# Patient Record
Sex: Male | Born: 1975 | Race: White | Hispanic: No | Marital: Single | Smoking: Current every day smoker
Health system: Southern US, Community
[De-identification: ages and names within clinical notes are randomized; demographics above are authoritative.]

## PROBLEM LIST (undated history)

## (undated) DIAGNOSIS — J3089 Other allergic rhinitis: Secondary | ICD-10-CM

## (undated) DIAGNOSIS — R51 Headache: Secondary | ICD-10-CM

## (undated) DIAGNOSIS — M009 Pyogenic arthritis, unspecified: Secondary | ICD-10-CM

## (undated) DIAGNOSIS — F602 Antisocial personality disorder: Secondary | ICD-10-CM

## (undated) DIAGNOSIS — R251 Tremor, unspecified: Secondary | ICD-10-CM

## (undated) DIAGNOSIS — E785 Hyperlipidemia, unspecified: Secondary | ICD-10-CM

## (undated) DIAGNOSIS — F319 Bipolar disorder, unspecified: Secondary | ICD-10-CM

## (undated) DIAGNOSIS — R519 Headache, unspecified: Secondary | ICD-10-CM

## (undated) DIAGNOSIS — K219 Gastro-esophageal reflux disease without esophagitis: Secondary | ICD-10-CM

## (undated) DIAGNOSIS — F209 Schizophrenia, unspecified: Secondary | ICD-10-CM

## (undated) DIAGNOSIS — R569 Unspecified convulsions: Secondary | ICD-10-CM

## (undated) HISTORY — PX: WRIST RECONSTRUCTION: SHX2675

## (undated) HISTORY — PX: CLOSED REDUCTION WRIST FRACTURE: SHX1091

## (undated) HISTORY — PX: ORIF ELBOW FRACTURE: SUR928

## (undated) HISTORY — PX: SCALP LACERATION REPAIR: SHX6089

---

## 2012-03-21 LAB — URINALYSIS, COMPLETE
Bacteria: NONE SEEN
Bilirubin,UR: NEGATIVE
Blood: NEGATIVE
Glucose,UR: NEGATIVE mg/dL
Ketone: NEGATIVE
Leukocyte Esterase: NEGATIVE
Nitrite: NEGATIVE
Ph: 5
Protein: NEGATIVE
RBC,UR: 1 /HPF
Specific Gravity: 1.019
Squamous Epithelial: NONE SEEN
WBC UR: <1 /HPF

## 2012-03-21 LAB — ETHANOL
Ethanol %: <0.003 %
Ethanol: <3 mg/dL

## 2012-03-21 LAB — DRUG SCREEN, URINE
Barbiturates, Ur Screen: POSITIVE (ref ?–200)
Benzodiazepine, Ur Scrn: NEGATIVE (ref ?–200)
Cannabinoid 50 Ng, Ur ~~LOC~~: NEGATIVE (ref ?–50)
Cocaine Metabolite,Ur ~~LOC~~: NEGATIVE (ref ?–300)
MDMA (Ecstasy)Ur Screen: NEGATIVE (ref ?–500)
Opiate, Ur Screen: NEGATIVE (ref ?–300)
Phencyclidine (PCP) Ur S: NEGATIVE (ref ?–25)

## 2012-03-21 LAB — COMPREHENSIVE METABOLIC PANEL
Albumin: 3.9 g/dL (ref 3.4–5.0)
Alkaline Phosphatase: 113 U/L (ref 50–136)
BUN: 11 mg/dL (ref 7–18)
Bilirubin,Total: 0.4 mg/dL (ref 0.2–1.0)
Calcium, Total: 8.9 mg/dL (ref 8.5–10.1)
Chloride: 107 mmol/L (ref 98–107)
Co2: 26 mmol/L (ref 21–32)
Glucose: 95 mg/dL (ref 65–99)
Osmolality: 282 (ref 275–301)
Potassium: 3.6 mmol/L (ref 3.5–5.1)
SGOT(AST): 28 U/L (ref 15–37)

## 2012-03-21 LAB — CBC
HCT: 52.8 % — ABNORMAL HIGH
HGB: 18.5 g/dL — ABNORMAL HIGH
MCH: 31.7 pg
MCHC: 35.1 g/dL
MCV: 90 fL
Platelet: 233 x10 3/mm 3
RBC: 5.85 x10 6/mm 3
RDW: 14.3 %
WBC: 8.2 x10 3/mm 3

## 2012-03-21 LAB — TSH: Thyroid Stimulating Horm: 1.19 u[IU]/mL

## 2012-03-24 ENCOUNTER — Inpatient Hospital Stay: Payer: Self-pay | Admitting: Psychiatry

## 2012-03-27 LAB — LIPID PANEL
Cholesterol: 192 mg/dL (ref 0–200)
HDL Cholesterol: 38 mg/dL — ABNORMAL LOW (ref 40–60)
Triglycerides: 139 mg/dL (ref 0–200)
VLDL Cholesterol, Calc: 28 mg/dL (ref 5–40)

## 2012-03-31 LAB — COMPREHENSIVE METABOLIC PANEL
Anion Gap: 7 (ref 7–16)
Calcium, Total: 9.3 mg/dL (ref 8.5–10.1)
Chloride: 104 mmol/L (ref 98–107)
Co2: 31 mmol/L (ref 21–32)
Creatinine: 0.96 mg/dL (ref 0.60–1.30)
EGFR (Non-African Amer.): >60
Glucose: 92 mg/dL (ref 65–99)
Osmolality: 282 (ref 275–301)
Potassium: 4.7 mmol/L (ref 3.5–5.1)
SGOT(AST): 28 U/L (ref 15–37)
Sodium: 142 mmol/L (ref 136–145)
Total Protein: 7.1 g/dL (ref 6.4–8.2)

## 2013-12-17 ENCOUNTER — Emergency Department: Payer: Self-pay | Admitting: Emergency Medicine

## 2014-02-03 ENCOUNTER — Emergency Department: Payer: Self-pay | Admitting: Emergency Medicine

## 2014-02-03 LAB — BASIC METABOLIC PANEL
Anion Gap: 10 (ref 7–16)
BUN: 7 mg/dL (ref 7–18)
CALCIUM: 9.6 mg/dL (ref 8.5–10.1)
CHLORIDE: 100 mmol/L (ref 98–107)
Co2: 22 mmol/L (ref 21–32)
Creatinine: 1.11 mg/dL (ref 0.60–1.30)
EGFR (African American): >60
GLUCOSE: 107 mg/dL — AB (ref 65–99)
OSMOLALITY: 263 (ref 275–301)
Potassium: 4.3 mmol/L (ref 3.5–5.1)
Sodium: 132 mmol/L — ABNORMAL LOW (ref 136–145)

## 2014-02-03 LAB — CBC WITH DIFFERENTIAL/PLATELET
BASOS PCT: 0.6 %
Basophil #: 0.1 10*3/uL (ref 0.0–0.1)
EOS PCT: 0.1 %
Eosinophil #: 0 10*3/uL (ref 0.0–0.7)
HCT: 53.8 % — ABNORMAL HIGH (ref 40.0–52.0)
HGB: 18.2 g/dL — ABNORMAL HIGH (ref 13.0–18.0)
Lymphocyte #: 1.8 10*3/uL (ref 1.0–3.6)
Lymphocyte %: 17.1 %
MCH: 31.2 pg (ref 26.0–34.0)
MCHC: 33.8 g/dL (ref 32.0–36.0)
MCV: 92 fL (ref 80–100)
MONOS PCT: 12.2 %
Monocyte #: 1.3 x10 3/mm — ABNORMAL HIGH (ref 0.2–1.0)
NEUTROS ABS: 7.5 10*3/uL — AB (ref 1.4–6.5)
Neutrophil %: 70 %
PLATELETS: 214 10*3/uL (ref 150–440)
RBC: 5.82 10*6/uL (ref 4.40–5.90)
RDW: 13.9 % (ref 11.5–14.5)
WBC: 10.7 10*3/uL — AB (ref 3.8–10.6)

## 2014-02-03 LAB — CK: CK, TOTAL: 196 U/L

## 2014-02-27 ENCOUNTER — Emergency Department: Payer: Self-pay | Admitting: Student

## 2014-02-27 LAB — ETHANOL

## 2014-02-27 LAB — URINALYSIS, COMPLETE
BACTERIA: NONE SEEN
BILIRUBIN, UR: NEGATIVE
Blood: NEGATIVE
Glucose,UR: NEGATIVE mg/dL (ref 0–75)
Ketone: NEGATIVE
LEUKOCYTE ESTERASE: NEGATIVE
NITRITE: NEGATIVE
PH: 6 (ref 4.5–8.0)
Protein: NEGATIVE
SPECIFIC GRAVITY: 1.004 (ref 1.003–1.030)
Squamous Epithelial: NONE SEEN
WBC UR: NONE SEEN /HPF (ref 0–5)

## 2014-02-27 LAB — COMPREHENSIVE METABOLIC PANEL
ALBUMIN: 3.5 g/dL (ref 3.4–5.0)
Alkaline Phosphatase: 84 U/L
Anion Gap: 9 (ref 7–16)
BILIRUBIN TOTAL: 0.5 mg/dL (ref 0.2–1.0)
BUN: 9 mg/dL (ref 7–18)
CALCIUM: 8.5 mg/dL (ref 8.5–10.1)
CHLORIDE: 102 mmol/L (ref 98–107)
CREATININE: 0.87 mg/dL (ref 0.60–1.30)
Co2: 25 mmol/L (ref 21–32)
EGFR (African American): >60
EGFR (Non-African Amer.): >60
GLUCOSE: 103 mg/dL — AB (ref 65–99)
Osmolality: 271 (ref 275–301)
POTASSIUM: 4 mmol/L (ref 3.5–5.1)
SGOT(AST): 22 U/L (ref 15–37)
SGPT (ALT): 27 U/L
Sodium: 136 mmol/L (ref 136–145)
TOTAL PROTEIN: 6.8 g/dL (ref 6.4–8.2)

## 2014-02-27 LAB — DRUG SCREEN, URINE
Amphetamines, Ur Screen: NEGATIVE (ref ?–1000)
BARBITURATES, UR SCREEN: NEGATIVE (ref ?–200)
Benzodiazepine, Ur Scrn: NEGATIVE (ref ?–200)
Cannabinoid 50 Ng, Ur ~~LOC~~: NEGATIVE (ref ?–50)
Cocaine Metabolite,Ur ~~LOC~~: NEGATIVE (ref ?–300)
MDMA (ECSTASY) UR SCREEN: NEGATIVE (ref ?–500)
METHADONE, UR SCREEN: NEGATIVE (ref ?–300)
OPIATE, UR SCREEN: NEGATIVE (ref ?–300)
Phencyclidine (PCP) Ur S: NEGATIVE (ref ?–25)
Tricyclic, Ur Screen: NEGATIVE (ref ?–1000)

## 2014-02-27 LAB — TSH: Thyroid Stimulating Horm: 2.9 u[IU]/mL

## 2014-02-27 LAB — CBC
HCT: 50.8 % (ref 40.0–52.0)
HGB: 17.3 g/dL (ref 13.0–18.0)
MCH: 30.5 pg (ref 26.0–34.0)
MCHC: 34.1 g/dL (ref 32.0–36.0)
MCV: 90 fL (ref 80–100)
PLATELETS: 180 10*3/uL (ref 150–440)
RBC: 5.67 10*6/uL (ref 4.40–5.90)
RDW: 13.6 % (ref 11.5–14.5)
WBC: 7.8 10*3/uL (ref 3.8–10.6)

## 2014-02-27 LAB — ACETAMINOPHEN LEVEL: Acetaminophen: <2 ug/mL

## 2014-02-27 LAB — SALICYLATE LEVEL: Salicylates, Serum: 4.1 mg/dL — ABNORMAL HIGH

## 2014-09-02 ENCOUNTER — Ambulatory Visit (INDEPENDENT_AMBULATORY_CARE_PROVIDER_SITE_OTHER): Payer: Medicaid Other | Admitting: Podiatry

## 2014-09-02 VITALS — BP 122/64 | HR 70 | Resp 16 | Ht 71.0 in | Wt 187.0 lb

## 2014-09-02 DIAGNOSIS — M216X9 Other acquired deformities of unspecified foot: Secondary | ICD-10-CM

## 2014-09-02 DIAGNOSIS — L84 Corns and callosities: Secondary | ICD-10-CM | POA: Diagnosis not present

## 2014-09-02 NOTE — Progress Notes (Signed)
Patient ID: Russell Buchanan, male   DOB: 01/04/1976, 39 y.o.   MRN: [id number]  Subjective: 39 year old male presents to the office today with complaints of painful calluses to the bottom of both feet. He states he has pain mostly with pressure and walking. Denies any redness around the sites or any drainage. Denies any injury to bilateral lower extremities. No other complaints at this time.   Objective: AAO x3, NAD DP/PT pulses palpable b/l, CRT , < 3 sec Protective sensation intact with SWMF, vibratory sensation intact, Achilles tendon reflex intact.  Hyperkerotic lesions bilateral submetatarsal 1 and 5. Upon debridement no open lesions, drainage, or other clinical signs of infection. There is prominence and the metatarsal heads plantarly with atrophy of the fat pad. There are no other open lesions or pre-ulcer lesions identified to bilateral lower extremities. Nails are elonaged, dystrophic, discolored. No tenderness to the nails and no surrounding erythema or drainage.  No other areas of tenderess to bilateral lower extremities. No overlying edema, erythema, increased warmth bilaterally. MMT 5/5, ROM WNL No pain with calf compression, swelling, warmth, erythema.  Assessment: 39 year old male symptomatically hyperkeratotic lesions bilateral 1/5  Plan: -Treatment options discussed including alternatives, risks, complications -Hyperkeratotic lesion sharply debrided 4 without complication/bleeding -Dispensed metatarsal pads -Patient did not want nails debrided at today's appointment. Discussed Gaynelle Arabian options for possible fungus. Recommended over-the-counter fungi nail. -Follow-up as needed. In the meantime encouraged to call the office with any questions, concerns, change in symptoms.

## 2014-09-21 NOTE — H&P (Signed)
PATIENT NAME:  Russell Buchanan, Russell Buchanan MR#:  [id number] DATE OF BIRTH:  27-Jun-1975  DATE OF ADMISSION:  03/24/2012  IDENTIFYING INFORMATION: The patient is a 39 year old white male not employed and last worked many years ago. The patient is divorced for many years and has been living in a group home called [facility] for quite some time and probably since July of 2012.  The patient is divorced since 58.  The patient comes for inpatient hospitalization to psychiatry at [facility] with the chief complaint "I am paranoid, I take all of my medications, I don't want to talk about it, I just feel paranoid about people around and everything around".    HISTORY OF PRESENT ILLNESS: When the patient was asked when he last felt well he reported "I feel good now, I feel safe here".  He admits that he feels paranoid about things going on, but not exactly some people and not the people in the group home and he does not want to talk about it.   PAST PSYCHIATRIC HISTORY: The patient reports that he had several inpatient hospitalizations to psychiatry and it is hard and difficult to count.  He has been and out of major mental institutions for many times. The longest period of inpatient hospitalization was to [facility] for six months. No history of suicide attempts, although he did have wishes and thoughts, but he never did anything.  Being followed by a psychiatrist at the group home.  According to information obtained from the chart and notes written by [facility] the patient lives in a local group home and he has religious thoughts, always has delusion and calls himself immortal.  He thinks that he reads the Bible to prevent a plot against him. He denies any violent thoughts and does not want to harm himself, but his condition has been deteriorating and he will not talk anymore.  He has history of suicide attempts and incarceration for sexual offense, although the patient did not agree to suicide attempts and  he said he never had any.  He has had multiple hospitalizations for psychotic events and suicide attempts and has been on Haldol deaconate, Risperdal, Cogentin, Vistaril, Seroquel, Invega Sustenna, and Tegretol. Currently medications are as follows: 1. Cogentin 1 mg every day. 2. Vistaril 50 mg one twice daily. 3. Thorazine 100 mg twice daily. 4. Simvastatin 25 mg daily. 5. Vitamin D 5000 units every day. 6. Protonix 40 mg daily.  In addition, according to information obtained from the emergency room notes, he has been withdrawn and refusing to answer questions.  In addition, staff believed that he had been a threat to the staff as he feels that he is possessed by a demon and had been withdrawn because of the same.   FAMILY HISTORY OF MENTAL ILLNESS: The patient reports no known history of mental illness and no known history of suicides because he was raised by foster care most of the time.   SOCIAL HISTORY: Was raised by foster care most of his life. He dropped out in eleventh grade for no reason, got his GED a long time later, no college.    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  Last worked many years ago.    MILITARY HISTORY: None.  MARRIAGES: Married once, marriage lasted for two years. Cause of divorce "adultery". No children.  MEDICAL HISTORY: No known history of high blood pressure, no known history of diabetes mellitus. Has high cholesterol. No major surgeries. No major injuries. No history of motor vehicle accident. Has never been unconscious. No known drug allergies. He does not know the name of the physician that follows him.  PHYSICAL EXAMINATION:   VITALS: Temperature 97.6, pulse 68 per minute and regular, respirations 18 per minute and regular, and blood pressure 100/60 mmHg.   HEENT: Head is normocephalic, atraumatic. Pupils are equally  round and reactive to light and accommodation. Fundi bilaterally benign. Extraocular movements visualized. Tympanic membranes have no exudate.   NECK: Supple without any organomegaly or thyromegaly.  PULMONARY: Chest has normal expansion. Normal breath sounds heard.  CARDIOVASCULAR: Normal S1 without any murmurs or gallops.  ABDOMEN: Soft. No organomegaly. Bowel sounds heard.   RECTAL: Examination is deferred.  NEURO: Gait is normal. Romberg is intact. Cranial nerves II through XII grossly intact. Deep tendon reflexes are 2+ and normal. Plantars have normal response.   MENTAL STATUS EXAMINATION: The patient is dressed in street clothes, alert and oriented to place, person, and time. Calm, pleasant and cooperative. Affect is flat. Mood appears to be low and down. He does admit feeling unhappy and rather paranoid. He does not want to talk about his paranoid thoughts. He denies any paranoid ideas about staff here or at the group home, but he says he does feel paranoid about the situation and life in general. Denies feeling depressed. He is preoccupied about everything, paranoid most of the time. He denies hearing voices or seeing things. He could remember his date of birth. Cognition appears to be intact. He could count money. He could spell the word world. Abstraction and interpretation is rather below average. Denies any sleep or appetite disturbance, although staff at the group home stated that he is withdrawn. Insight and judgment are guarded.   IMPRESSION:  AXIS I:  1. Schizophrenia, chronic paranoid, in exacerbation. 2. Nicotine dependence.  AXIS II: Deferred.  AXIS III: High cholesterol, no major medical problems.  AXIS IV: Severe - long history of mental illness and has been in and out of mental institutions for the same.   AXIS V: GAF 25.  PLAN: The patient is admitted to [facility] for closer observation, evaluation and help. He will be  started back on all of his medications, but his Thorazine will be increased to 100 mg p.o. four times daily for better control of his psychosis. During the stay in the hospital, he will be given milieu therapy and supportive counseling. He will take part in individual and group therapy where coping skills will be addressed and compliance issues will be addressed. At the time of discharge, the patient will not be paranoid and this will be explored and medications will be adjusted so that this will be controlled. Followup appointment will be made and the patient will be discharged to appropriate living situation which will probably be a group home.  ____________________________ Jannet Mantis. Guss Bunde, MD skc:slb D: 03/25/2012 11:51:00 ET T: 03/25/2012 11:59:34 ET JOB#: [id number]  cc: Monika Salk K. Guss Bunde, MD, <Dictator> Beau Fanny MD ELECTRONICALLY SIGNED 03/25/2012 13:58

## 2014-09-25 NOTE — Consult Note (Signed)
PATIENT NAME:  Russell Buchanan, FERRARI MR#:  [id number] DATE OF BIRTH:  [birth year]  DATE OF CONSULTATION:  02/28/2014  CONSULTING PHYSICIAN:  Zehava Turski K. Mayana Irigoyen, MD  SUBJECTIVE:  The patient was seen in consultation at [facility].  The patient is a 39 year old white male with a long history of mental illness and has been living at current group home  for the past 8 months.  The patient reports that he got into an argument with the nurse and raised his hand at her and he realized it was wrong on his behalf and he should not have done it.  The patient reports that he has been followed by Dr. Lacie Scotts for his mental illness and that he was started on Chantix and this has been causing him nicotine withdrawal which is too much for him to handle and he was upset and frustrated and at that moment he raised his hand and realizes it was wrong. He likes the kind group home situation.    PAST PSYCHIATRIC HISTORY:  The patient has a long history of mental illness and has been living at the group home because of the same.  He has bipolar disorder.  Alcohol and drugs are denied.    MENTAL STATUS EXAMINATION:  The patient appears calm, quiet and cooperative, alert and oriented, able to tell who brought him here.  He does not appear to be responding to internal stimuli. Denies auditory or visual hallucinations, denies feeling depressed, denies any paranoia or suspicious ideas.  Cognition is intact. He knew that he was at the [facility] Emergency Room.  Denies any ideas or plans to hurt himself. He realizes it was his fault that he raised his hand and requests that drug of  Chantix be discontinued and he wants to go back to his room.    IMPRESSION:   1.  Schizoaffective, chronic, paranoid, currently stable.    2.  Nicotine, dependence.  He is on Chantix which needs to be discontinued.  RECOMMENDATIONS:  Discontinue involuntary commitment, discharge the patient back to the group home and recommend that Chantix be  discontinued by the group home staff and doctors as this has caused some side effects like nicotine cravings and he is not able to deal with the same.  He will keep up his follow up appointment with Dr. Lacie Scotts on an outpatient basis.      ____________________________ Jannet Mantis. Guss Bunde, MD skc:DT D: 02/28/2014 13:09:45 ET T: 02/28/2014 15:47:52 ET JOB#: [id number]  cc: Monika Salk K. Guss Bunde, MD, <Dictator> Beau Fanny MD ELECTRONICALLY SIGNED 03/19/2014 14:23

## 2014-12-08 ENCOUNTER — Emergency Department: Payer: Medicaid Other

## 2014-12-08 ENCOUNTER — Emergency Department
Admission: EM | Admit: 2014-12-08 | Discharge: 2014-12-08 | Disposition: A | Discharge status: Home / Self Care | Payer: Medicaid Other | Attending: Emergency Medicine | Admitting: Emergency Medicine

## 2014-12-08 DIAGNOSIS — M79672 Pain in left foot: Secondary | ICD-10-CM

## 2014-12-08 DIAGNOSIS — Z720 Tobacco use: Secondary | ICD-10-CM | POA: Diagnosis not present

## 2014-12-08 DIAGNOSIS — M25571 Pain in right ankle and joints of right foot: Secondary | ICD-10-CM | POA: Diagnosis not present

## 2014-12-08 DIAGNOSIS — M79671 Pain in right foot: Secondary | ICD-10-CM

## 2014-12-08 DIAGNOSIS — M25572 Pain in left ankle and joints of left foot: Secondary | ICD-10-CM | POA: Diagnosis not present

## 2014-12-08 DIAGNOSIS — Z79899 Other long term (current) drug therapy: Secondary | ICD-10-CM | POA: Diagnosis not present

## 2014-12-08 DIAGNOSIS — M25579 Pain in unspecified ankle and joints of unspecified foot: Secondary | ICD-10-CM

## 2014-12-08 DIAGNOSIS — G8929 Other chronic pain: Secondary | ICD-10-CM | POA: Insufficient documentation

## 2014-12-08 HISTORY — DX: Antisocial personality disorder: F60.2

## 2014-12-08 HISTORY — DX: Hyperlipidemia, unspecified: E78.5

## 2014-12-08 HISTORY — DX: Schizophrenia, unspecified: F20.9

## 2014-12-08 HISTORY — DX: Gastro-esophageal reflux disease without esophagitis: K21.9

## 2014-12-08 NOTE — ED Provider Notes (Deleted)
[facility] Emergency Department Provider Note  ____________________________________________  Time seen: Approximately 2:00 PM  I have reviewed the triage vital signs and the nursing notes.   HISTORY  Chief Complaint Ankle Pain    HPI Russell Buchanan is a 39 y.o. male presents for evaluation of Morton's neuroma to her right foot. Patient states that previous treatment when he worked for a short period time but has returned after the tramadol. He states that after taking tramadol she is extremely sleepy.   Past Medical History  Diagnosis Date  . Hyperlipidemia   . GERD (gastroesophageal reflux disease)   . Schizophrenia   . Antisocial personality disorder     There are no active problems to display for this patient.   History reviewed. No pertinent past surgical history.  Current Outpatient Rx  Name  Route  Sig  Dispense  Refill  . benztropine (COGENTIN) 1 MG tablet   Oral   Take by mouth.         . Butalbital-APAP-Caffeine 50-325-40 MG per capsule   Oral   Take by mouth.         . chlorproMAZINE (THORAZINE) 100 MG tablet   Oral   Take by mouth.         . Cholecalciferol 5000 UNITS TABS   Oral   Take by mouth.         . cyclobenzaprine (FLEXERIL) 10 MG tablet   Oral   Take by mouth.         . divalproex (DEPAKOTE ER) 500 MG 24 hr tablet   Oral   Take by mouth.         . EPINEPHrine 0.3 mg/0.3 mL IJ SOAJ injection   Intramuscular   Inject 0.015 mg into the muscle as needed.         . haloperidol (HALDOL) 10 MG tablet   Oral   Take by mouth.         Marland Kitchen LORazepam (ATIVAN) 1 MG tablet   Oral   Take by mouth.         . nystatin cream (MYCOSTATIN)   Topical   Apply 1 application topically as needed for dry skin.         Marland Kitchen ondansetron (ZOFRAN) 4 MG tablet   Oral   Take by mouth.         . pantoprazole (PROTONIX) 40 MG tablet   Oral   Take by mouth.         . prazosin (MINIPRESS) 1 MG capsule  Oral   Take by mouth.         . risperidone (RISPERDAL) 4 MG tablet   Oral   Take by mouth.         . simvastatin (ZOCOR) 20 MG tablet   Oral   Take by mouth.         . traZODone (DESYREL) 50 MG tablet   Oral   Take by mouth.         . triamcinolone cream (KENALOG) 0.5 %   Topical   Apply 1 application topically 2 (two) times daily.           Allergies Bee venom  No family history on file.  Social History History  Substance Use Topics  . Smoking status: Current Every Day Smoker -- 1.00 packs/day    Types: Cigarettes  . Smokeless tobacco: Never Used  . Alcohol Use: No    Review of Systems Constitutional: No fever/chills Eyes: No  visual changes. ENT: No sore throat. Cardiovascular: Denies chest pain. Respiratory: Denies shortness of breath. Gastrointestinal: No abdominal pain.  No nausea, no vomiting.  No diarrhea.  No constipation. Genitourinary: Negative for dysuria. Musculoskeletal: Positive for Morton's neuroma. Skin: Negative for rash. Neurological: Negative for headaches, focal weakness or numbness.  10-point ROS otherwise negative.  ____________________________________________   PHYSICAL EXAM:  VITAL SIGNS: ED Triage Vitals  Enc Vitals Group     BP 12/08/14 1359 123/82 mmHg     Pulse Rate 12/08/14 1359 108     Resp 12/08/14 1359 18     Temp 12/08/14 1359 98.5 F (36.9 C)     Temp Source 12/08/14 1359 Oral     SpO2 12/08/14 1359 97 %     Weight 12/08/14 1359 170 lb (77.111 kg)     Height 12/08/14 1359 5\' 11"  (1.803 m)     Head Cir --      Peak Flow --      Pain Score 12/08/14 1400 7     Pain Loc --      Pain Edu? --      Excl. in GC? --     Constitutional: Alert and oriented. Well appearing and in no acute distress. Eyes: Conjunctivae are normal. PERRL. EOMI. Head: Atraumatic. Nose: No congestion/rhinnorhea. Mouth/Throat: Mucous membranes are moist.  Oropharynx non-erythematous. Neck: No stridor.   Cardiovascular: Normal  rate, regular rhythm. Grossly normal heart sounds.  Good peripheral circulation. Respiratory: Normal respiratory effort.  No retractions. Lungs CTAB. Gastrointestinal: Soft and nontender. No distention. No abdominal bruits. No CVA tenderness. Musculoskeletal: Tender lower extremity plantar aspect of the foot around second third and fourth digits. Erythema or edema noted Neurologic:  Normal speech and language. No gross focal neurologic deficits are appreciated. Speech is normal. No gait instability. Skin:  Skin is warm, dry and intact. No rash noted. Psychiatric: Mood and affect are normal. Speech and behavior are normal.  ____________________________________________   LABS (all labs ordered are listed, but only abnormal results are displayed)  Labs Reviewed - No data to display    PROCEDURES  Procedure(s) performed: None  Critical Care performed: No  ____________________________________________   INITIAL IMPRESSION / ASSESSMENT AND PLAN / ED COURSE  Pertinent labs & imaging results that were available during my care of the patient were reviewed by me and considered in my medical decision making (see chart for details).  Reassurance provided. Patient to follow-up with podiatry as scheduled next week. Rx given for Vicodin 5 mg 3 times a day, Motrin 800 mg 3 times a day. Patient voices no other emergency medical symptomology and will return to the ER with any worsening symptoms. ____________________________________________   FINAL CLINICAL IMPRESSION(S) / ED DIAGNOSES  Final diagnoses:  None      Evangeline Dakin, PA-C 12/08/14 1445

## 2014-12-08 NOTE — ED Provider Notes (Signed)
[facility] Emergency Department Provider Note  ____________________________________________  Time seen: Approximately 2:58 PM  I have reviewed the triage vital signs and the nursing notes.   HISTORY  Chief Complaint Ankle Pain    HPI Russell Emswiler is a 39 y.o. male who presents from a group home for evaluation of bilateral foot and ankle pain. States that he wobbles when he walks and has increased pain and lack of support. Denies any trauma.She currently followed by podiatry but unable to see the doctor to begin of the month. Would like x-rays of both feet and ankles. Past medical history significant for paranoid schizophrenia and antisocial personality disorder.   Past Medical History  Diagnosis Date  . Hyperlipidemia   . GERD (gastroesophageal reflux disease)   . Schizophrenia   . Antisocial personality disorder     There are no active problems to display for this patient.   History reviewed. No pertinent past surgical history.  Current Outpatient Rx  Name  Route  Sig  Dispense  Refill  . benztropine (COGENTIN) 1 MG tablet   Oral   Take by mouth.         . Butalbital-APAP-Caffeine 50-325-40 MG per capsule   Oral   Take by mouth.         . chlorproMAZINE (THORAZINE) 100 MG tablet   Oral   Take by mouth.         . Cholecalciferol 5000 UNITS TABS   Oral   Take by mouth.         . cyclobenzaprine (FLEXERIL) 10 MG tablet   Oral   Take by mouth.         . divalproex (DEPAKOTE ER) 500 MG 24 hr tablet   Oral   Take by mouth.         . EPINEPHrine 0.3 mg/0.3 mL IJ SOAJ injection   Intramuscular   Inject 0.015 mg into the muscle as needed.         . haloperidol (HALDOL) 10 MG tablet   Oral   Take by mouth.         Marland Kitchen LORazepam (ATIVAN) 1 MG tablet   Oral   Take by mouth.         . nystatin cream (MYCOSTATIN)   Topical   Apply 1 application topically as needed for dry skin.         Marland Kitchen ondansetron (ZOFRAN) 4  MG tablet   Oral   Take by mouth.         . pantoprazole (PROTONIX) 40 MG tablet   Oral   Take by mouth.         . prazosin (MINIPRESS) 1 MG capsule   Oral   Take by mouth.         . risperidone (RISPERDAL) 4 MG tablet   Oral   Take by mouth.         . simvastatin (ZOCOR) 20 MG tablet   Oral   Take by mouth.         . traZODone (DESYREL) 50 MG tablet   Oral   Take by mouth.         . triamcinolone cream (KENALOG) 0.5 %   Topical   Apply 1 application topically 2 (two) times daily.           Allergies Bee venom  No family history on file.  Social History History  Substance Use Topics  . Smoking status: Current Every Day Smoker --  1.00 packs/day    Types: Cigarettes  . Smokeless tobacco: Never Used  . Alcohol Use: No    Review of Systems Constitutional: No fever/chills Eyes: No visual changes. ENT: No sore throat. Cardiovascular: Denies chest pain. Respiratory: Denies shortness of breath. Gastrointestinal: No abdominal pain.  No nausea, no vomiting.  No diarrhea.  No constipation. Genitourinary: Negative for dysuria. Musculoskeletal: Full range of motion both ankles and feet with no evidence of any trauma edema and ecchymosis. Skin: Negative for rash. Neurological: Negative for headaches, focal weakness or numbness.  10-point ROS otherwise negative.  ____________________________________________   PHYSICAL EXAM:  VITAL SIGNS: ED Triage Vitals  Enc Vitals Group     BP 12/08/14 1359 123/82 mmHg     Pulse Rate 12/08/14 1359 108     Resp 12/08/14 1359 18     Temp 12/08/14 1359 98.5 F (36.9 C)     Temp Source 12/08/14 1359 Oral     SpO2 12/08/14 1359 97 %     Weight 12/08/14 1359 170 lb (77.111 kg)     Height 12/08/14 1359  (1.803 m)     Head Cir --      Peak Flow --      Pain Score 12/08/14 1400 7     Pain Loc --      Pain Edu? --      Excl. in GC? --     Constitutional: Alert and oriented. Well appearing and in no acute  distress. Eyes: Conjunctivae are normal. PERRL. EOMI. Head: Atraumatic. Nose: No congestion/rhinnorhea. Mouth/Throat: Mucous membranes are moist.  Oropharynx non-erythematous. Neck: No stridor.   Cardiovascular: Normal rate, regular rhythm. Grossly normal heart sounds.  Good peripheral circulation. Respiratory: Normal respiratory effort.  No retractions. Lungs CTAB. Gastrointestinal: Soft and nontender. No distention. No abdominal bruits. No CVA tenderness. Musculoskeletal: No lower extremity tenderness nor edema.  No joint effusions. Neurologic:  Normal speech and language. No gross focal neurologic deficits are appreciated. Speech is normal. No gait instability. Skin:  Skin is warm, dry and intact. No rash noted. Psychiatric: Mood and affect are normal. Speech and behavior are normal.  ____________________________________________   LABS (all labs ordered are listed, but only abnormal results are displayed)  Labs Reviewed - No data to display ____________________________________________   RADIOLOGY  Bilateral ankle and feet both negative interpreted by radiologist and reviewed by myself. ____________________________________________   PROCEDURES  Procedure(s) performed: None  Critical Care performed: No  ____________________________________________   INITIAL IMPRESSION / ASSESSMENT AND PLAN / ED COURSE  Pertinent labs & imaging results that were available during my care of the patient were reviewed by me and considered in my medical decision making (see chart for details).  Bilateral ankle and foot pain chronic in nature. Rx none given. Bilateral ankle splints provided and encouraged patient to follow up with group home family physician or podiatry as directed. Patient voices no other emergency medical complaints at this time. ____________________________________________   FINAL CLINICAL IMPRESSION(S) / ED DIAGNOSES  Final diagnoses:  Ankle pain, chronic, unspecified  laterality  Foot pain, bilateral      Evangeline Dakin, PA-C 12/08/14 1637  Darien Ramus, MD 12/09/14 [phone]

## 2014-12-08 NOTE — ED Notes (Signed)
Pt comes into the ED with c/o BL ankle and foot pain for years, worse in the past week.

## 2014-12-08 NOTE — Discharge Instructions (Signed)
Ankle Pain °Ankle pain is a common symptom. The bones, cartilage, tendons, and muscles of the ankle joint perform a lot of work each day. The ankle joint holds your body weight and allows you to move around. Ankle pain can occur on either side or back of 1 or both ankles. Ankle pain may be sharp and burning or dull and aching. There may be tenderness, stiffness, redness, or warmth around the ankle. The pain occurs more often when a person walks or puts pressure on the ankle. °CAUSES  °There are many reasons ankle pain can develop. It is important to work with your caregiver to identify the cause since many conditions can impact the bones, cartilage, muscles, and tendons. Causes for ankle pain include: °· Injury, including a break (fracture), sprain, or strain often due to a fall, sports, or a high-impact activity. °· Swelling (inflammation) of a tendon (tendonitis). °· Achilles tendon rupture. °· Ankle instability after repeated sprains and strains. °· Poor foot alignment. °· Pressure on a nerve (tarsal tunnel syndrome). °· Arthritis in the ankle or the lining of the ankle. °· Crystal formation in the ankle (gout or pseudogout). °DIAGNOSIS  °A diagnosis is based on your medical history, your symptoms, results of your physical exam, and results of diagnostic tests. Diagnostic tests may include X-ray exams or a computerized magnetic scan (magnetic resonance imaging, MRI). °TREATMENT  °Treatment will depend on the cause of your ankle pain and may include: °· Keeping pressure off the ankle and limiting activities. °· Using crutches or other walking support (a cane or brace). °· Using rest, ice, compression, and elevation. °· Participating in physical therapy or home exercises. °· Wearing shoe inserts or special shoes. °· Losing weight. °· Taking medications to reduce pain or swelling or receiving an injection. °· Undergoing surgery. °HOME CARE INSTRUCTIONS  °· Only take over-the-counter or prescription medicines for  pain, discomfort, or fever as directed by your caregiver. °· Put ice on the injured area. °¨ Put ice in a plastic bag. °¨ Place a towel between your skin and the bag. °¨ Leave the ice on for 15-20 minutes at a time, 03-04 times a day. °· Keep your leg raised (elevated) when possible to lessen swelling. °· Avoid activities that cause ankle pain. °· Follow specific exercises as directed by your caregiver. °· Record how often you have ankle pain, the location of the pain, and what it feels like. This information may be helpful to you and your caregiver. °· Ask your caregiver about returning to work or sports and whether you should drive. °· Follow up with your caregiver for further examination, therapy, or testing as directed. °SEEK MEDICAL CARE IF:  °· Pain or swelling continues or worsens beyond 1 week. °· You have an oral temperature above 102° F (38.9° C). °· You are feeling unwell or have chills. °· You are having an increasingly difficult time with walking. °· You have loss of sensation or other new symptoms. °· You have questions or concerns. °MAKE SURE YOU:  °· Understand these instructions. °· Will watch your condition. °· Will get help right away if you are not doing well or get worse. °Document Released: 11/08/2009 Document Revised: 08/13/2011 Document Reviewed: 11/08/2009 °[employer]® Patient Information ©2015 [employer]. This information is not intended to replace advice given to you by your health care provider. Make sure you discuss any questions you have with your health care provider. ° °Pain of Unknown Etiology (Pain Without a Known Cause) °You have come to   your caregiver because of pain. Pain can occur in any part of the body. Often there is not a definite cause. If your laboratory (blood or urine) work was normal and X-rays or other studies were normal, your caregiver may treat you without knowing the cause of the pain. An example of this is the headache. Most headaches are diagnosed by taking a  history. This means your caregiver asks you questions about your headaches. Your caregiver determines a treatment based on your answers. Usually testing done for headaches is normal. Often testing is not done unless there is no response to medications. Regardless of where your pain is located today, you can be given medications to make you comfortable. If no physical cause of pain can be found, most cases of pain will gradually leave as suddenly as they came.  °If you have a painful condition and no reason can be found for the pain, it is important that you follow up with your caregiver. If the pain becomes worse or does not go away, it may be necessary to repeat tests and look further for a possible cause. °· Only take over-the-counter or prescription medicines for pain, discomfort, or fever as directed by your caregiver. °· For the protection of your privacy, test results cannot be given over the phone. Make sure you receive the results of your test. Ask how these results are to be obtained if you have not been informed. It is your responsibility to obtain your test results. °· You may continue all activities unless the activities cause more pain. When the pain lessens, it is important to gradually resume normal activities. Resume activities by beginning slowly and gradually increasing the intensity and duration of the activities or exercise. During periods of severe pain, bed rest may be helpful. Lie or sit in any position that is comfortable. °· Ice used for acute (sudden) conditions may be effective. Use a large plastic bag filled with ice and wrapped in a towel. This may provide pain relief. °· See your caregiver for continued problems. Your caregiver can help or refer you for exercises or physical therapy if necessary. °If you were given medications for your condition, do not drive, operate machinery or power tools, or sign legal documents for 24 hours. Do not drink alcohol, take sleeping pills, or take other  medications that may interfere with treatment. °See your caregiver immediately if you have pain that is becoming worse and not relieved by medications. °Document Released: 02/13/2001 Document Revised: 03/11/2013 Document Reviewed: 05/21/2005 °[employer]® Patient Information ©2015 [employer]. This information is not intended to replace advice given to you by your health care provider. Make sure you discuss any questions you have with your health care provider. ° °

## 2014-12-09 ENCOUNTER — Telehealth: Payer: Self-pay | Admitting: *Deleted

## 2014-12-09 NOTE — Telephone Encounter (Signed)
Pt would like to discuss purchasing more pads for his feet, cost and if insurance covers.

## 2014-12-10 NOTE — Telephone Encounter (Signed)
Tried to return patient call , the number was a fax line

## 2014-12-23 ENCOUNTER — Encounter: Payer: Self-pay | Admitting: Podiatry

## 2014-12-23 ENCOUNTER — Ambulatory Visit (INDEPENDENT_AMBULATORY_CARE_PROVIDER_SITE_OTHER): Payer: Medicaid Other | Admitting: Podiatry

## 2014-12-23 VITALS — BP 126/89 | HR 86 | Resp 18

## 2014-12-23 DIAGNOSIS — M79673 Pain in unspecified foot: Secondary | ICD-10-CM | POA: Diagnosis not present

## 2014-12-23 DIAGNOSIS — L84 Corns and callosities: Secondary | ICD-10-CM

## 2014-12-23 NOTE — Progress Notes (Signed)
Patient ID: Russell Buchanan, male   DOB: 11-04-1975, 39 y.o.   MRN: [id number]  Subjective: 39 year old male presents to the office today with complaints of reoccur ance of painful calluses to the bottom of both feet. He states he has pain mostly with pressure and walking. Denies any redness around the sites or any drainage. Denies any injury to bilateral lower extremities. No other complaints at this time.   Objective: AAO x3, NAD DP/PT pulses palpable b/l, CRT , < 3 sec Protective sensation intact with SWMF, vibratory sensation intact, Achilles tendon reflex intact.  Hyperkerotic lesions bilateral submetatarsal 1 and 5. Upon debridement no open lesions, drainage, or other clinical signs of infection. There is prominence and the metatarsal heads plantarly with atrophy of the fat pad. There are no other open lesions or pre-ulcer lesions identified to bilateral lower extremities. Nails are elonaged, dystrophic, discolored. No tenderness to the nails and no surrounding erythema or drainage.  No other areas of tenderess to bilateral lower extremities. No overlying edema, erythema, increased warmth bilaterally. MMT 5/5, ROM WNL No pain with calf compression, swelling, warmth, erythema.  Assessment: 39 year old male symptomatically hyperkeratotic lesions bilateral 1/5  Plan: -Treatment options discussed including alternatives, risks, complications -Hyperkeratotic lesion sharply debrided 4 without complication/bleeding -Dispensed metatarsal pads -Follow-up as needed. In the meantime encouraged to call the office with any questions, concerns, change in symptoms.  Ovid Curd, DPM

## 2015-03-31 ENCOUNTER — Ambulatory Visit: Payer: Medicaid Other

## 2015-09-02 ENCOUNTER — Encounter: Payer: Self-pay | Admitting: Emergency Medicine

## 2015-09-02 ENCOUNTER — Emergency Department
Admission: EM | Admit: 2015-09-02 | Discharge: 2015-09-02 | Disposition: A | Discharge status: Home / Self Care | Payer: Medicaid Other | Attending: Emergency Medicine | Admitting: Emergency Medicine

## 2015-09-02 DIAGNOSIS — F17210 Nicotine dependence, cigarettes, uncomplicated: Secondary | ICD-10-CM | POA: Insufficient documentation

## 2015-09-02 DIAGNOSIS — F602 Antisocial personality disorder: Secondary | ICD-10-CM | POA: Insufficient documentation

## 2015-09-02 DIAGNOSIS — Z760 Encounter for issue of repeat prescription: Secondary | ICD-10-CM | POA: Insufficient documentation

## 2015-09-02 DIAGNOSIS — E785 Hyperlipidemia, unspecified: Secondary | ICD-10-CM | POA: Diagnosis not present

## 2015-09-02 DIAGNOSIS — Z79899 Other long term (current) drug therapy: Secondary | ICD-10-CM | POA: Insufficient documentation

## 2015-09-02 DIAGNOSIS — K219 Gastro-esophageal reflux disease without esophagitis: Secondary | ICD-10-CM | POA: Diagnosis not present

## 2015-09-02 DIAGNOSIS — F209 Schizophrenia, unspecified: Secondary | ICD-10-CM | POA: Insufficient documentation

## 2015-09-02 LAB — CBC WITH DIFFERENTIAL/PLATELET
BASOS ABS: 0.1 10*3/uL (ref 0–0.1)
Basophils Relative: 1 %
EOS PCT: 0 %
Eosinophils Absolute: 0 10*3/uL (ref 0–0.7)
HCT: 47.9 % (ref 40.0–52.0)
Hemoglobin: 16.8 g/dL (ref 13.0–18.0)
LYMPHS ABS: 2.5 10*3/uL (ref 1.0–3.6)
Lymphocytes Relative: 28 %
MCH: 31.2 pg (ref 26.0–34.0)
MCHC: 35.1 g/dL (ref 32.0–36.0)
MCV: 89 fL (ref 80.0–100.0)
Monocytes Absolute: 0.7 10*3/uL (ref 0.2–1.0)
Monocytes Relative: 8 %
Neutro Abs: 5.6 10*3/uL (ref 1.4–6.5)
Neutrophils Relative %: 63 %
Platelets: 246 10*3/uL (ref 150–440)
RBC: 5.39 MIL/uL (ref 4.40–5.90)
RDW: 13.6 % (ref 11.5–14.5)
WBC: 8.9 10*3/uL (ref 3.8–10.6)

## 2015-09-02 LAB — VALPROIC ACID LEVEL: Valproic Acid Lvl: <10 ug/mL — ABNORMAL LOW (ref 50.0–100.0)

## 2015-09-02 LAB — BASIC METABOLIC PANEL
Anion gap: 5 (ref 5–15)
BUN: 8 mg/dL (ref 6–20)
CALCIUM: 9.4 mg/dL (ref 8.9–10.3)
CO2: 29 mmol/L (ref 22–32)
Chloride: 100 mmol/L — ABNORMAL LOW (ref 101–111)
Creatinine, Ser: 0.88 mg/dL (ref 0.61–1.24)
GFR calc Af Amer: >60 mL/min (ref >60)
Glucose, Bld: 106 mg/dL — ABNORMAL HIGH (ref 65–99)
Potassium: 3.8 mmol/L (ref 3.5–5.1)
SODIUM: 134 mmol/L — AB (ref 135–145)

## 2015-09-02 MED ORDER — VALPROIC ACID 250 MG PO CAPS
1000.0000 mg | ORAL_CAPSULE | Freq: Once | ORAL | Status: AC
  Administered 2015-09-02: 1000 mg via ORAL
  Filled 2015-09-02: qty 4

## 2015-09-02 MED ORDER — SODIUM CHLORIDE 0.9 % IV BOLUS (SEPSIS)
1000.0000 mL | Freq: Once | INTRAVENOUS | Status: AC
  Administered 2015-09-02: 1000 mL via INTRAVENOUS

## 2015-09-02 NOTE — ED Notes (Signed)
Pt out of meds since 3/28 due to loosing medicare. Takes Depakote 500mg  bid and 1000mg  hs. Feels "swimmy headed" and tingly like last time he did not take meds.

## 2015-09-02 NOTE — ED Notes (Signed)
Called pharmacy to request medication. Pharmacy said they would send med

## 2015-09-02 NOTE — ED Notes (Signed)
Patient states he has been out of his Depakote ER 500mg  BID since Tuesday evening. Last dose was Tuesday morning. Missed medication due to a glitch in the medicaid system that said he was no longer covered.  Patient reports medication is coming tonight.  He is c/o diarrhea x 5 today and x 4 yesterday and nauseated.  Patient states he feels like his Depakote level is low and states he feels like his mind is swimmy and that his whole body is tingling.  He also has not been taking Seroquel at bedtime so he has not been getting sleep.  Patient states he takes Depakote as a mood stabilizer. Russell Buchanan stays at a group home and has arrangements with a cab company for transport home.

## 2015-09-02 NOTE — Discharge Instructions (Signed)
Begin taking your Depakote medication as prescribed. Follow-up with your primary care doctor if any continued problems. Tonight  You were given a loading dose of Depakote

## 2015-09-02 NOTE — ED Provider Notes (Signed)
[facility] Emergency Department Provider Note  ____________________________________________  Time seen: Approximately 5:58 PM  I have reviewed the triage vital signs and the nursing notes.   HISTORY  Chief Complaint Medication Refill   HPI Russell Buchanan is a 40 y.o. male is brought from a group home for evaluation of his feeling different. Patient states that he has been out of his Depakote ER 500 mg that he is best to take twice a day for the last 3 and half days. Patient explains that there was a lapse in his Medicaid and therefore did not receive his Depakote intraorally fashion. He is currently living at a group home and feels different tonight. He also complains of having diarrhea 5 times a day along with 4 times yesterday. He denies any vomiting. He states that at this time he's been informed that his Medicaid has been reinstated and that there is a shipment at the group home with his Depakote in it. He denies any fever or chills. He is also unclear as to the reason he is taking Depakote whether it is for seizure activity or 4 psychological reasons.When asked about seizure activity he states "grew out of that".   Past Medical History  Diagnosis Date  . Hyperlipidemia   . GERD (gastroesophageal reflux disease)   . Schizophrenia (HCC)   . Antisocial personality disorder     There are no active problems to display for this patient.   History reviewed. No pertinent past surgical history.  Current Outpatient Rx  Name  Route  Sig  Dispense  Refill  . benztropine (COGENTIN) 1 MG tablet   Oral   Take by mouth.         . Butalbital-APAP-Caffeine 50-325-40 MG per capsule   Oral   Take by mouth.         . chlorproMAZINE (THORAZINE) 100 MG tablet   Oral   Take by mouth.         . Cholecalciferol 5000 UNITS TABS   Oral   Take by mouth.         . cyclobenzaprine (FLEXERIL) 10 MG tablet   Oral   Take by mouth.         . divalproex  (DEPAKOTE ER) 500 MG 24 hr tablet   Oral   Take by mouth.         . EPINEPHrine 0.3 mg/0.3 mL IJ SOAJ injection   Intramuscular   Inject 0.015 mg into the muscle as needed.         . haloperidol (HALDOL) 10 MG tablet   Oral   Take by mouth.         Marland Kitchen LORazepam (ATIVAN) 1 MG tablet   Oral   Take by mouth.         . nystatin cream (MYCOSTATIN)   Topical   Apply 1 application topically as needed for dry skin.         Marland Kitchen ondansetron (ZOFRAN) 4 MG tablet   Oral   Take by mouth.         . pantoprazole (PROTONIX) 40 MG tablet   Oral   Take by mouth.         . prazosin (MINIPRESS) 1 MG capsule   Oral   Take by mouth.         . risperidone (RISPERDAL) 4 MG tablet   Oral   Take by mouth.         . simvastatin (ZOCOR) 20 MG tablet  Oral   Take by mouth.         . traZODone (DESYREL) 50 MG tablet   Oral   Take by mouth.         . triamcinolone cream (KENALOG) 0.5 %   Topical   Apply 1 application topically 2 (two) times daily.           Allergies Bee venom  History reviewed. No pertinent family history.  Social History Social History  Substance Use Topics  . Smoking status: Current Every Day Smoker -- 1.00 packs/day    Types: Cigarettes  . Smokeless tobacco: Never Used  . Alcohol Use: No    Review of Systems Constitutional: No fever/chills Eyes: No visual changes. Cardiovascular: Denies chest pain. Respiratory: Denies shortness of breath. Gastrointestinal: No abdominal pain.  No nausea, no vomiting.  Positive diarrhea.   Genitourinary: Negative for dysuria. Musculoskeletal: Negative for back pain. Skin: Negative for rash. Neurological: Negative for headaches.  10-point ROS otherwise negative.  ____________________________________________   PHYSICAL EXAM:  VITAL SIGNS: ED Triage Vitals  Enc Vitals Group     BP 09/02/15 1710 123/87 mmHg     Pulse Rate 09/02/15 1710 71     Resp --      Temp 09/02/15 1710 98.4 F (36.9  C)     Temp Source 09/02/15 1710 Oral     SpO2 09/02/15 1710 95 %     Weight 09/02/15 1710 204 lb 4 oz (92.647 kg)     Height --      Head Cir --      Peak Flow --      Pain Score --      Pain Loc --      Pain Edu? --      Excl. in GC? --     Constitutional: Alert and oriented. Well appearing and in no acute distress. Patient is cooperative during exam. Eyes: Conjunctivae are normal. PERRL. EOMI. Head: Atraumatic. Nose: No congestion/rhinnorhea. Mouth/Throat: Mucous membranes are moist.  Oropharynx non-erythematous. Neck: No stridor.  Supple Cardiovascular: Normal rate, regular rhythm. Grossly normal heart sounds.  Good peripheral circulation. Respiratory: Normal respiratory effort.  No retractions. Lungs CTAB. Gastrointestinal: Soft and nontender. No distention. Musculoskeletal: No lower extremity tenderness nor edema.  No joint effusions. Neurologic:  Normal speech and language. No gross focal neurologic deficits are appreciated. No gait instability. Skin:  Skin is warm, dry and intact. No rash noted. Psychiatric: Mood and affect are normal. Speech and behavior are normal.  ____________________________________________   LABS (all labs ordered are listed, but only abnormal results are displayed)  Labs Reviewed  BASIC METABOLIC PANEL - Abnormal; Notable for the following:    Sodium 134 (*)    Chloride 100 (*)    Glucose, Bld 106 (*)    All other components within normal limits  VALPROIC ACID LEVEL - Abnormal; Notable for the following:    Valproic Acid Lvl <10 (*)    All other components within normal limits  CBC WITH DIFFERENTIAL/PLATELET    PROCEDURES  Procedure(s) performed: None  Critical Care performed: No  ____________________________________________   INITIAL IMPRESSION / ASSESSMENT AND PLAN / ED COURSE  Pertinent labs & imaging results that were available during my care of the patient were reviewed by me and considered in my medical decision making (see  chart for details).  Patient was made aware that his Depakote level was low and prior to his discharge from the emergency room he was given a loading dose  of Depakote 1000 mg. Patient is to continue his routine Depakote 500 mg twice a day. He is also to contact his primary care doctor or doctor that is prescribing his Depakote for any further difficulties in taking his medication. At the time of discharge patient was very pleasant and cooperative. There was no diarrhea noted while the patient was in the emergency room. ____________________________________________   FINAL CLINICAL IMPRESSION(S) / ED DIAGNOSES  Final diagnoses:  Patient receiving medication management services from refill clinic      Tommi Rumps, PA-C 09/03/15 0002  Myrna Blazer, MD 09/03/15 [phone]

## 2016-05-21 ENCOUNTER — Other Ambulatory Visit: Payer: Self-pay | Admitting: Neurology

## 2016-05-21 DIAGNOSIS — G43019 Migraine without aura, intractable, without status migrainosus: Secondary | ICD-10-CM

## 2016-06-01 ENCOUNTER — Ambulatory Visit
Admission: RE | Admit: 2016-06-01 | Discharge: 2016-06-01 | Disposition: A | Discharge status: Home / Self Care | Payer: Medicaid Other | Source: Ambulatory Visit | Attending: Neurology | Admitting: Neurology

## 2016-06-01 DIAGNOSIS — G43019 Migraine without aura, intractable, without status migrainosus: Secondary | ICD-10-CM | POA: Diagnosis present

## 2016-09-21 ENCOUNTER — Emergency Department
Admission: EM | Admit: 2016-09-21 | Discharge: 2016-09-21 | Disposition: A | Discharge status: Home / Self Care | Payer: Medicaid Other | Attending: Emergency Medicine | Admitting: Emergency Medicine

## 2016-09-21 ENCOUNTER — Encounter: Payer: Self-pay | Admitting: Emergency Medicine

## 2016-09-21 DIAGNOSIS — M7918 Myalgia, other site: Secondary | ICD-10-CM

## 2016-09-21 DIAGNOSIS — R112 Nausea with vomiting, unspecified: Secondary | ICD-10-CM | POA: Insufficient documentation

## 2016-09-21 DIAGNOSIS — F17210 Nicotine dependence, cigarettes, uncomplicated: Secondary | ICD-10-CM | POA: Insufficient documentation

## 2016-09-21 DIAGNOSIS — R1012 Left upper quadrant pain: Secondary | ICD-10-CM | POA: Insufficient documentation

## 2016-09-21 DIAGNOSIS — R1110 Vomiting, unspecified: Secondary | ICD-10-CM

## 2016-09-21 LAB — URINALYSIS, COMPLETE (UACMP) WITH MICROSCOPIC
BACTERIA UA: NONE SEEN
Bilirubin Urine: NEGATIVE
Glucose, UA: NEGATIVE mg/dL
Ketones, ur: NEGATIVE mg/dL
LEUKOCYTES UA: NEGATIVE
NITRITE: NEGATIVE
PROTEIN: NEGATIVE mg/dL
SQUAMOUS EPITHELIAL / LPF: NONE SEEN
Specific Gravity, Urine: 1.016 (ref 1.005–1.030)
pH: 6 (ref 5.0–8.0)

## 2016-09-21 LAB — CBC
HEMATOCRIT: 47.8 % (ref 40.0–52.0)
HEMOGLOBIN: 16.2 g/dL (ref 13.0–18.0)
MCH: 29.8 pg (ref 26.0–34.0)
MCHC: 33.9 g/dL (ref 32.0–36.0)
MCV: 87.8 fL (ref 80.0–100.0)
Platelets: 256 10*3/uL (ref 150–440)
RBC: 5.45 MIL/uL (ref 4.40–5.90)
RDW: 14.3 % (ref 11.5–14.5)
WBC: 8.7 10*3/uL (ref 3.8–10.6)

## 2016-09-21 LAB — COMPREHENSIVE METABOLIC PANEL
ALT: 23 U/L (ref 17–63)
ANION GAP: 9 (ref 5–15)
AST: 23 U/L (ref 15–41)
Albumin: 4 g/dL (ref 3.5–5.0)
Alkaline Phosphatase: 99 U/L (ref 38–126)
BILIRUBIN TOTAL: 0.5 mg/dL (ref 0.3–1.2)
BUN: 14 mg/dL (ref 6–20)
CHLORIDE: 100 mmol/L — AB (ref 101–111)
CO2: 29 mmol/L (ref 22–32)
Calcium: 9.6 mg/dL (ref 8.9–10.3)
Creatinine, Ser: 1.05 mg/dL (ref 0.61–1.24)
GFR calc Af Amer: >60 mL/min (ref >60)
Glucose, Bld: 113 mg/dL — ABNORMAL HIGH (ref 65–99)
POTASSIUM: 4.4 mmol/L (ref 3.5–5.1)
Sodium: 138 mmol/L (ref 135–145)
TOTAL PROTEIN: 7.3 g/dL (ref 6.5–8.1)

## 2016-09-21 LAB — LIPASE, BLOOD: LIPASE: 38 U/L (ref 11–51)

## 2016-09-21 MED ORDER — LIDOCAINE 5 % EX PTCH: 1.0000 | MEDICATED_PATCH | Freq: Two times a day (BID) | CUTANEOUS | 0 refills | Status: DC

## 2016-09-21 MED ORDER — ONDANSETRON 4 MG PO TBDP
4.0000 mg | ORAL_TABLET | Freq: Once | ORAL | Status: DC | PRN
  Filled 2016-09-21: qty 1

## 2016-09-21 MED ORDER — DIAZEPAM 5 MG PO TABS
5.0000 mg | ORAL_TABLET | Freq: Once | ORAL | Status: AC
  Administered 2016-09-21: 5 mg via ORAL
  Filled 2016-09-21: qty 1

## 2016-09-21 MED ORDER — METOCLOPRAMIDE HCL 10 MG PO TABS
ORAL_TABLET | ORAL | Status: AC
  Administered 2016-09-21: 10 mg via ORAL
  Filled 2016-09-21: qty 1

## 2016-09-21 MED ORDER — LIDOCAINE 5 % EX PTCH
1.0000 | MEDICATED_PATCH | CUTANEOUS | Status: DC
  Administered 2016-09-21: 1 via TRANSDERMAL
  Filled 2016-09-21: qty 1

## 2016-09-21 MED ORDER — METOCLOPRAMIDE HCL 5 MG/ML IJ SOLN: 10.0000 mg | Freq: Once | INTRAMUSCULAR | Status: DC

## 2016-09-21 MED ORDER — METOCLOPRAMIDE HCL 10 MG PO TABS
10.0000 mg | ORAL_TABLET | Freq: Once | ORAL | Status: AC
  Administered 2016-09-21: 10 mg via ORAL

## 2016-09-21 MED ORDER — KETOROLAC TROMETHAMINE 60 MG/2ML IM SOLN
15.0000 mg | INTRAMUSCULAR | Status: AC
Start: 2016-09-21 — End: 2016-09-21
  Administered 2016-09-21: 15 mg via INTRAMUSCULAR
  Filled 2016-09-21: qty 2

## 2016-09-21 NOTE — ED Notes (Addendum)
Pt from [facility], contact info [phone]

## 2016-09-21 NOTE — Discharge Instructions (Signed)
We believe that your symptoms are caused by musculoskeletal strain.  Please read through the included information about additional care such as heating pads, over-the-counter pain medicine.  If you were provided a prescription please use it only as needed and as instructed.  Remember that early mobility and using the affected part of your body is actually better than keeping it immobile.  Take your regular prescription medications.  Follow-up with the doctor listed as recommended or return to the emergency department with new or worsening symptoms that concern you.

## 2016-09-21 NOTE — ED Provider Notes (Signed)
[facility] Emergency Department Provider Note  ____________________________________________   First MD Initiated Contact with Patient 09/21/16 0421     (approximate)  I have reviewed the triage vital signs and the nursing notes.   HISTORY  Chief Complaint Flank Pain (left) and Emesis    HPI Russell Buchanan is a 41 y.o. male who presents for evaluation of acute onset left lateral side pain.  He reports that it started acutely while he was vomiting and he felt something pop.  The pain is at the bottom of his rib cage and upper part of his left lateral abdomen.  He says it feels like a severe cramping pain that comes and goes based on his movement.  Rest makes it a little bit better.  He denies chest pain, shortness of breath, and fever/chills.  He states that vomiting is a persistent problem for him which he has had for years and is managed by his primary care provider.  It is not at all uncommon for him to vomit, but he believes that his forceful vomiting tonight was what caused him to pull something in his side.  He was concerned it might be his kidney so wanted to get checked out.  He denies dysuria, hematuria, and any history of kidney stone.   Past Medical History:  Diagnosis Date  . Antisocial personality disorder   . GERD (gastroesophageal reflux disease)   . Hyperlipidemia   . Schizophrenia (HCC)     There are no active problems to display for this patient.   History reviewed. No pertinent surgical history.  Prior to Admission medications   Medication Sig Start Date End Date Taking? Authorizing Provider  benztropine (COGENTIN) 1 MG tablet Take by mouth.    Historical Provider, MD  Butalbital-APAP-Caffeine 50-325-40 MG per capsule Take by mouth.    Historical Provider, MD  chlorproMAZINE (THORAZINE) 100 MG tablet Take by mouth.    Historical Provider, MD  Cholecalciferol 5000 UNITS TABS Take by mouth.    Historical Provider, MD  cyclobenzaprine  (FLEXERIL) 10 MG tablet Take by mouth.    Historical Provider, MD  divalproex (DEPAKOTE ER) 500 MG 24 hr tablet Take by mouth.    Historical Provider, MD  EPINEPHrine 0.3 mg/0.3 mL IJ SOAJ injection Inject 0.015 mg into the muscle as needed.    Historical Provider, MD  haloperidol (HALDOL) 10 MG tablet Take by mouth.    Historical Provider, MD  lidocaine (LIDODERM) 5 % Place 1 patch onto the skin every 12 (twelve) hours. Remove & Discard patch within 12 hours or as directed by MD.  Wynelle Fanny the patch off for 12 hours before applying a new one. 09/21/16 09/21/17  Loleta Rose, MD  LORazepam (ATIVAN) 1 MG tablet Take by mouth.    Historical Provider, MD  nystatin cream (MYCOSTATIN) Apply 1 application topically as needed for dry skin.    Historical Provider, MD  ondansetron (ZOFRAN) 4 MG tablet Take by mouth.    Historical Provider, MD  pantoprazole (PROTONIX) 40 MG tablet Take by mouth.    Historical Provider, MD  prazosin (MINIPRESS) 1 MG capsule Take by mouth.    Historical Provider, MD  risperidone (RISPERDAL) 4 MG tablet Take by mouth.    Historical Provider, MD  simvastatin (ZOCOR) 20 MG tablet Take by mouth.    Historical Provider, MD  traZODone (DESYREL) 50 MG tablet Take by mouth.    Historical Provider, MD  triamcinolone cream (KENALOG) 0.5 % Apply 1 application topically 2 (  two) times daily.    Historical Provider, MD    Allergies Bee venom  History reviewed. No pertinent family history.  Social History Social History  Substance Use Topics  . Smoking status: Current Every Day Smoker    Packs/day: 1.00    Types: Cigarettes  . Smokeless tobacco: Never Used  . Alcohol use No    Review of Systems Constitutional: No fever/chills Eyes: No visual changes. ENT: No sore throat. Cardiovascular: Denies chest pain. Respiratory: Denies shortness of breath. Gastrointestinal: Left lateral upper abdominal and lower chest wall pain.  Acute on chronic nausea and vomiting.  No  diarrhea. Genitourinary: Negative for dysuria and hematuria Musculoskeletal: Negative for back pain.  Negative for flank pain Skin: Negative for rash. Neurological: Negative for headaches, focal weakness or numbness.  10-point ROS otherwise negative.  ____________________________________________   PHYSICAL EXAM:  VITAL SIGNS: ED Triage Vitals  Enc Vitals Group     BP 09/21/16 0227 122/81     Pulse Rate 09/21/16 0227 84     Resp 09/21/16 0227 18     Temp 09/21/16 0227 98.4 F (36.9 C)     Temp Source 09/21/16 0227 Oral     SpO2 09/21/16 0227 96 %     Weight 09/21/16 0228 233 lb (105.7 kg)     Height 09/21/16 0228 5\' 11"  (1.803 m)     Head Circumference --      Peak Flow --      Pain Score 09/21/16 0227 7     Pain Loc --      Pain Edu? --      Excl. in GC? --     Constitutional: Alert and oriented. Well appearing and in no acute distress. Eyes: Conjunctivae are normal. PERRL. EOMI. Head: Atraumatic. Nose: No congestion/rhinnorhea. Mouth/Throat: Mucous membranes are moist. Neck: No stridor.  No meningeal signs.   Cardiovascular: Normal rate, regular rhythm. Good peripheral circulation. Grossly normal heart sounds. Respiratory: Normal respiratory effort.  No retractions. Lungs CTAB. Gastrointestinal: Soft and nontender. When the patient was sat up he began complaining again of cramping pain in the left side of his chest wall and upper abdomen which is tender to palpation.  Otherwise he has no abdominal tenderness. Musculoskeletal: No lower extremity tenderness nor edema. No gross deformities of extremities.  Negative for CVA tenderness bilaterally Neurologic:  Normal speech and language. No gross focal neurologic deficits are appreciated.  Skin:  Skin is warm, dry and intact. No rash noted. Psychiatric: Mood and affect are normal. Speech and behavior are normal.  ____________________________________________   LABS (all labs ordered are listed, but only abnormal results  are displayed)  Labs Reviewed  COMPREHENSIVE METABOLIC PANEL - Abnormal; Notable for the following:       Result Value   Chloride 100 (*)    Glucose, Bld 113 (*)    All other components within normal limits  URINALYSIS, COMPLETE (UACMP) WITH MICROSCOPIC - Abnormal; Notable for the following:    Color, Urine YELLOW (*)    APPearance CLEAR (*)    Hgb urine dipstick SMALL (*)    All other components within normal limits  LIPASE, BLOOD  CBC   ____________________________________________  EKG  None - EKG not ordered by ED physician ____________________________________________  RADIOLOGY   No results found.  ____________________________________________   PROCEDURES  Critical Care performed: No   Procedure(s) performed:   Procedures   ____________________________________________   INITIAL IMPRESSION / ASSESSMENT AND PLAN / ED COURSE  Pertinent labs & imaging  results that were available during my care of the patient were reviewed by me and considered in my medical decision making (see chart for details).  The patient has a reassuring exam and workup.  He has not had any additional vomiting 4 hours.  His lab work is unremarkable.  We will check a urinalysis for evidence of hematuria but the signs, symptoms, and exam suggest that he did pull a muscle while vomiting.  I will give him Toradol 15 mg intramuscular and Valium 5 mg by mouth to help with the spasms.  He will be appropriate for outpatient follow-up and over-the-counter pain medication.  He has Ativan at home and states that his last dose was about 18 hours ago so he should be appropriate for the Valium.   Clinical Course as of Sep 21 649  Fri Sep 21, 2016  0508 UA unremarkable.  No indication for imaging.  Will d/c with instructions for outpatient follow up.  [CF]  0510 I reviewed the patient's prescription history over the last 12 months in the multi-state controlled substances database(s) that includes  [place], [place], [place], [place], [place], [place], [place], [place], [place], [place], [place], [place], [place], and [place].  Results were notable for regular prescriptions for lorazepam, but no opioid prescriptions  [CF]    Clinical Course User Index [CF] Loleta Rose, MD    ____________________________________________  FINAL CLINICAL IMPRESSION(S) / ED DIAGNOSES  Final diagnoses:  Musculoskeletal pain  Chronic vomiting     MEDICATIONS GIVEN DURING THIS VISIT:  Medications  ondansetron (ZOFRAN-ODT) disintegrating tablet 4 mg (4 mg Oral Not Given 09/21/16 0234)  lidocaine (LIDODERM) 5 % 1 patch (1 patch Transdermal Patch Applied 09/21/16 0528)  metoCLOPramide (REGLAN) tablet 10 mg (10 mg Oral Given 09/21/16 0241)  ketorolac (TORADOL) injection 15 mg (15 mg Intramuscular Given 09/21/16 0440)  diazepam (VALIUM) tablet 5 mg (5 mg Oral Given 09/21/16 0440)     NEW OUTPATIENT MEDICATIONS STARTED DURING THIS VISIT:  Discharge Medication List as of 09/21/2016  5:11 AM    START taking these medications   Details  lidocaine (LIDODERM) 5 % Place 1 patch onto the skin every 12 (twelve) hours. Remove & Discard patch within 12 hours or as directed by MD.  Wynelle Fanny the patch off for 12 hours before applying a new one., Starting Fri 09/21/2016, Until Sat 09/21/2017, Print        Discharge Medication List as of 09/21/2016  5:11 AM      Discharge Medication List as of 09/21/2016  5:11 AM       Note:  This document was prepared using Dragon voice recognition software and may include unintentional dictation errors.    Loleta Rose, MD 09/21/16 [phone]

## 2016-09-21 NOTE — ED Notes (Signed)

## 2016-09-21 NOTE — ED Triage Notes (Signed)
Pt reports left flank pain since 1am, reports ongoing problems with vomiting which has not been dx by his doctor, but taking promethazine w/o relief.  Pt report vomit x 2 today, report felt something in left side pop.  Pt in NAD at this time

## 2016-11-08 ENCOUNTER — Emergency Department: Payer: Medicaid Other

## 2016-11-08 ENCOUNTER — Emergency Department
Admission: EM | Admit: 2016-11-08 | Discharge: 2016-11-08 | Disposition: A | Discharge status: Home / Self Care | Payer: Medicaid Other | Attending: Emergency Medicine | Admitting: Emergency Medicine

## 2016-11-08 ENCOUNTER — Encounter: Payer: Self-pay | Admitting: Emergency Medicine

## 2016-11-08 DIAGNOSIS — R531 Weakness: Secondary | ICD-10-CM | POA: Diagnosis not present

## 2016-11-08 DIAGNOSIS — R197 Diarrhea, unspecified: Secondary | ICD-10-CM | POA: Insufficient documentation

## 2016-11-08 DIAGNOSIS — R296 Repeated falls: Secondary | ICD-10-CM | POA: Diagnosis not present

## 2016-11-08 DIAGNOSIS — F17210 Nicotine dependence, cigarettes, uncomplicated: Secondary | ICD-10-CM | POA: Diagnosis not present

## 2016-11-08 DIAGNOSIS — R1110 Vomiting, unspecified: Secondary | ICD-10-CM | POA: Diagnosis not present

## 2016-11-08 DIAGNOSIS — R079 Chest pain, unspecified: Secondary | ICD-10-CM | POA: Diagnosis not present

## 2016-11-08 DIAGNOSIS — R0602 Shortness of breath: Secondary | ICD-10-CM | POA: Diagnosis not present

## 2016-11-08 DIAGNOSIS — R509 Fever, unspecified: Secondary | ICD-10-CM | POA: Insufficient documentation

## 2016-11-08 LAB — BASIC METABOLIC PANEL
Anion gap: 9 (ref 5–15)
BUN: 7 mg/dL (ref 6–20)
CHLORIDE: 106 mmol/L (ref 101–111)
CO2: 24 mmol/L (ref 22–32)
Calcium: 9.4 mg/dL (ref 8.9–10.3)
Creatinine, Ser: 0.92 mg/dL (ref 0.61–1.24)
GFR calc Af Amer: >60 mL/min (ref >60)
GFR calc non Af Amer: >60 mL/min (ref >60)
Glucose, Bld: 162 mg/dL — ABNORMAL HIGH (ref 65–99)
POTASSIUM: 4 mmol/L (ref 3.5–5.1)
Sodium: 139 mmol/L (ref 135–145)

## 2016-11-08 LAB — CBC
HEMATOCRIT: 46.1 % (ref 40.0–52.0)
Hemoglobin: 15.7 g/dL (ref 13.0–18.0)
MCH: 30.1 pg (ref 26.0–34.0)
MCHC: 34.1 g/dL (ref 32.0–36.0)
MCV: 88.2 fL (ref 80.0–100.0)
PLATELETS: 194 10*3/uL (ref 150–440)
RBC: 5.23 MIL/uL (ref 4.40–5.90)
RDW: 13.8 % (ref 11.5–14.5)
WBC: 7.3 10*3/uL (ref 3.8–10.6)

## 2016-11-08 LAB — TROPONIN I: Troponin I: <0.03 ng/mL (ref <0.03)

## 2016-11-08 LAB — VALPROIC ACID LEVEL: Valproic Acid Lvl: 72 ug/mL (ref 50.0–100.0)

## 2016-11-08 MED ORDER — ONDANSETRON 4 MG PO TBDP
4.0000 mg | ORAL_TABLET | Freq: Once | ORAL | Status: AC
  Administered 2016-11-08: 4 mg via ORAL
  Filled 2016-11-08: qty 1

## 2016-11-08 NOTE — ED Triage Notes (Signed)
Pt reports fell X 3 today because of feeling weak all over.  Reports legs, arms, and hands will just drop.  Pt sent from group home in cab.  Oriented at this time. Ambulatory to triage without difficulty.

## 2016-11-08 NOTE — ED Notes (Signed)
Patient able to ambulate unassisted to wheelchair.

## 2016-11-08 NOTE — ED Notes (Signed)
Reviewed d/c instructions and follow-up care with patient and caregiver (telephonically with caregiver). Pt and caregiver verbalized understanding.

## 2016-11-08 NOTE — ED Notes (Signed)
Called [employer] per caregiver request to transport patient back to group home.

## 2016-11-08 NOTE — ED Notes (Signed)
Ambulated patient per MD order. Patient very weak, unable to remain upright without assistance. Pt placed back in bed. MD Mayford Knife notified. MD Mayford Knife to bedside

## 2016-11-08 NOTE — ED Provider Notes (Signed)
[facility] Emergency Department Provider Note       Time seen: ----------------------------------------- 8:21 PM on 11/08/2016 -----------------------------------------     I have reviewed the triage vital signs and the nursing notes.   HISTORY   Chief Complaint Weakness    HPI Russell Buchanan is a 41 y.o. male who presents to the ED for multiple falls today. Patient fell because he has been feeling weak all over. Patient complains of fevers, chills, chest pain, shortness of breath, vomiting and diarrhea. Patient was sent from the group home in a cab.   Past Medical History:  Diagnosis Date  . Antisocial personality disorder   . GERD (gastroesophageal reflux disease)   . Hyperlipidemia   . Schizophrenia (HCC)     There are no active problems to display for this patient.   History reviewed. No pertinent surgical history.  Allergies Bee venom  Social History Social History  Substance Use Topics  . Smoking status: Current Every Day Smoker    Packs/day: 1.00    Types: Cigarettes  . Smokeless tobacco: Never Used  . Alcohol use No    Review of Systems Constitutional: Positive for fevers, chills Eyes: Negative for vision changes ENT:  Negative for congestion, sore throat Cardiovascular: Positive for chest pain Respiratory: Positive for shortness of breath Gastrointestinal: Positive for abdominal pain, positive for vomiting, positive for diarrhea Genitourinary: Negative for dysuria. Musculoskeletal: Positive for back pain Skin: Negative for rash. Neurological: Positive for weakness  All systems negative/normal/unremarkable except as stated in the HPI  ____________________________________________   PHYSICAL EXAM:  VITAL SIGNS: ED Triage Vitals  Enc Vitals Group     BP 11/08/16 1900 (!) 117/91     Pulse Rate 11/08/16 1900 96     Resp 11/08/16 1900 18     Temp 11/08/16 1900 98.3 F (36.8 C)     Temp Source 11/08/16 1900 Oral      SpO2 11/08/16 1900 98 %     Weight 11/08/16 1857 225 lb (102.1 kg)     Height 11/08/16 1857 5\' 11"  (1.803 m)     Head Circumference --      Peak Flow --      Pain Score --      Pain Loc --      Pain Edu? --      Excl. in GC? --     Constitutional: Alert and oriented. Well appearing and in no distress. Eyes: Conjunctivae are normal. Normal extraocular movements. ENT   Head: Normocephalic and atraumatic.   Nose: No congestion/rhinnorhea.   Mouth/Throat: Mucous membranes are moist.   Neck: No stridor. Cardiovascular: Normal rate, regular rhythm. No murmurs, rubs, or gallops. Respiratory: Normal respiratory effort without tachypnea nor retractions. Breath sounds are clear and equal bilaterally. No wheezes/rales/rhonchi. Gastrointestinal: Soft and nontender. Normal bowel sounds Musculoskeletal: Nontender with normal range of motion in extremities. No lower extremity tenderness nor edema. Neurologic:  Normal speech and language. No gross focal neurologic deficits are appreciated.  Skin:  Skin is warm, dry and intact. No rash noted. Psychiatric: Mood and affect are normal. Speech and behavior are normal.  ____________________________________________  EKG: Interpreted by me. Normal sinus rhythm with a rate of 95 bpm, normal PR interval, normal QRS, normal QT.  ____________________________________________  ED COURSE:  Pertinent labs & imaging results that were available during my care of the patient were reviewed by me and considered in my medical decision making (see chart for details). Patient presents for weakness, we will assess  with labs and imaging as indicated.   Procedures ____________________________________________   LABS (pertinent positives/negatives)  Labs Reviewed  BASIC METABOLIC PANEL - Abnormal; Notable for the following:       Result Value   Glucose, Bld 162 (*)    All other components within normal limits  CBC  TROPONIN I  VALPROIC ACID LEVEL   URINALYSIS, COMPLETE (UACMP) WITH MICROSCOPIC    RADIOLOGY  CT head IMPRESSION: Normal CT evaluation of the brain. ____________________________________________  FINAL ASSESSMENT AND PLAN  Weakness  Plan: Patient's labs and imaging were dictated above. Patient had presented for weakness of uncertain etiology. He appears likely overmedicated and I will encourage holding any sedating medications until he improves. He is stable medically for outpatient follow-up.   Emily Filbert, MD   Note: This note was generated in part or whole with voice recognition software. Voice recognition is usually quite accurate but there are transcription errors that can and very often do occur. I apologize for any typographical errors that were not detected and corrected.     Emily Filbert, MD 11/08/16 2158

## 2016-11-14 ENCOUNTER — Encounter: Payer: Self-pay | Admitting: Emergency Medicine

## 2016-11-14 ENCOUNTER — Emergency Department
Admission: EM | Admit: 2016-11-14 | Discharge: 2016-11-15 | Disposition: A | Discharge status: Home / Self Care | Payer: Medicaid Other | Attending: Nurse Practitioner | Admitting: Nurse Practitioner

## 2016-11-14 DIAGNOSIS — R531 Weakness: Secondary | ICD-10-CM

## 2016-11-14 DIAGNOSIS — F17210 Nicotine dependence, cigarettes, uncomplicated: Secondary | ICD-10-CM | POA: Insufficient documentation

## 2016-11-14 DIAGNOSIS — F602 Antisocial personality disorder: Secondary | ICD-10-CM | POA: Insufficient documentation

## 2016-11-14 DIAGNOSIS — F203 Undifferentiated schizophrenia: Secondary | ICD-10-CM | POA: Diagnosis not present

## 2016-11-14 DIAGNOSIS — T887XXA Unspecified adverse effect of drug or medicament, initial encounter: Secondary | ICD-10-CM

## 2016-11-14 DIAGNOSIS — F209 Schizophrenia, unspecified: Secondary | ICD-10-CM | POA: Diagnosis not present

## 2016-11-14 DIAGNOSIS — R440 Auditory hallucinations: Secondary | ICD-10-CM

## 2016-11-14 LAB — HEPATIC FUNCTION PANEL
ALK PHOS: 76 U/L (ref 38–126)
ALT: 23 U/L (ref 17–63)
AST: 28 U/L (ref 15–41)
Albumin: 4.3 g/dL (ref 3.5–5.0)
BILIRUBIN DIRECT: 0.1 mg/dL (ref 0.1–0.5)
BILIRUBIN TOTAL: 0.6 mg/dL (ref 0.3–1.2)
Indirect Bilirubin: 0.5 mg/dL (ref 0.3–0.9)
Total Protein: 7.4 g/dL (ref 6.5–8.1)

## 2016-11-14 LAB — BASIC METABOLIC PANEL
Anion gap: 9 (ref 5–15)
BUN: 11 mg/dL (ref 6–20)
CALCIUM: 9.6 mg/dL (ref 8.9–10.3)
CO2: 27 mmol/L (ref 22–32)
CREATININE: 1.23 mg/dL (ref 0.61–1.24)
Chloride: 101 mmol/L (ref 101–111)
GFR calc Af Amer: >60 mL/min (ref >60)
Glucose, Bld: 111 mg/dL — ABNORMAL HIGH (ref 65–99)
Potassium: 3.9 mmol/L (ref 3.5–5.1)
SODIUM: 137 mmol/L (ref 135–145)

## 2016-11-14 LAB — CBC
HCT: 47.6 % (ref 40.0–52.0)
Hemoglobin: 16.4 g/dL (ref 13.0–18.0)
MCH: 30.3 pg (ref 26.0–34.0)
MCHC: 34.5 g/dL (ref 32.0–36.0)
MCV: 87.9 fL (ref 80.0–100.0)
PLATELETS: 212 10*3/uL (ref 150–440)
RBC: 5.42 MIL/uL (ref 4.40–5.90)
RDW: 14.1 % (ref 11.5–14.5)
WBC: 8.9 10*3/uL (ref 3.8–10.6)

## 2016-11-14 LAB — URINALYSIS, COMPLETE (UACMP) WITH MICROSCOPIC
BILIRUBIN URINE: NEGATIVE
Bacteria, UA: NONE SEEN
Glucose, UA: NEGATIVE mg/dL
HGB URINE DIPSTICK: NEGATIVE
Ketones, ur: 5 mg/dL — AB
LEUKOCYTES UA: NEGATIVE
NITRITE: NEGATIVE
PH: 7 (ref 5.0–8.0)
Protein, ur: 30 mg/dL — AB
SPECIFIC GRAVITY, URINE: 1.011 (ref 1.005–1.030)
Squamous Epithelial / LPF: NONE SEEN
WBC, UA: NONE SEEN WBC/hpf (ref 0–5)

## 2016-11-14 LAB — URINE DRUG SCREEN, QUALITATIVE (ARMC ONLY)
AMPHETAMINES, UR SCREEN: NOT DETECTED
BENZODIAZEPINE, UR SCRN: NOT DETECTED
Barbiturates, Ur Screen: POSITIVE — AB
COCAINE METABOLITE, UR ~~LOC~~: NOT DETECTED
Cannabinoid 50 Ng, Ur ~~LOC~~: NOT DETECTED
MDMA (Ecstasy)Ur Screen: NOT DETECTED
METHADONE SCREEN, URINE: NOT DETECTED
OPIATE, UR SCREEN: NOT DETECTED
Phencyclidine (PCP) Ur S: NOT DETECTED
TRICYCLIC, UR SCREEN: POSITIVE — AB

## 2016-11-14 LAB — ACETAMINOPHEN LEVEL: Acetaminophen (Tylenol), Serum: <10 ug/mL — ABNORMAL LOW (ref 10–30)

## 2016-11-14 LAB — ETHANOL: Alcohol, Ethyl (B): <5 mg/dL (ref <5)

## 2016-11-14 LAB — TROPONIN I: Troponin I: <0.03 ng/mL (ref <0.03)

## 2016-11-14 LAB — VALPROIC ACID LEVEL: Valproic Acid Lvl: 78 ug/mL (ref 50.0–100.0)

## 2016-11-14 LAB — SALICYLATE LEVEL

## 2016-11-14 MED ORDER — DIVALPROEX SODIUM 500 MG PO DR TAB
1000.0000 mg | DELAYED_RELEASE_TABLET | Freq: Once | ORAL | Status: AC
  Administered 2016-11-14: 1000 mg via ORAL

## 2016-11-14 MED ORDER — CHLORPROMAZINE HCL 100 MG PO TABS
200.0000 mg | ORAL_TABLET | Freq: Once | ORAL | Status: AC
  Administered 2016-11-14: 200 mg via ORAL
  Filled 2016-11-14: qty 2

## 2016-11-14 MED ORDER — SODIUM CHLORIDE 0.9 % IV BOLUS (SEPSIS)
1000.0000 mL | Freq: Once | INTRAVENOUS | Status: AC
  Administered 2016-11-14: 1000 mL via INTRAVENOUS

## 2016-11-14 MED ORDER — QUETIAPINE FUMARATE ER 200 MG PO TB24
200.0000 mg | ORAL_TABLET | Freq: Once | ORAL | Status: AC
  Administered 2016-11-14: 200 mg via ORAL
  Filled 2016-11-14: qty 1

## 2016-11-14 MED ORDER — LORAZEPAM 1 MG PO TABS: 1.0000 mg | ORAL_TABLET | Freq: Once | ORAL | Status: DC

## 2016-11-14 NOTE — BH Assessment (Signed)
Assessment Note  Russell Buchanan is an 41 y.o. male who presents to the ER due to an increase of hallucinations and they are with commands. Per ER notes, the voices were telling him to harm others and his self. He told this Clinical research associate the voices were saying "bad things about God" and he did not want to repeat it.  He also states he was brought to the ER because of his weight. He believes his medications (Seroquel) are causing him to gain weight and the Care home was having concerns about it.   Patient further reports, his psychiatrist, Dr. Omelia Blackwater have started to reduce some of his medications. He is unsure of the current dosages. He started to notice the increase in the AV/H, this past Friday (11/09/2016). Patient denies intent to harm others or his self. "I've heard voices for pretty much my whole life. They started when I was a kid. So that's probably how it's going to be but they have got louder."  Patient denies the use of mind-altering substances. He also denies involvement the legal system and with DSS. He denies having a history of aggression and violence.  Patient denies SI/HI and endorses AV/H.   Diagnosis: Schizophrenia  Past Medical History:  Past Medical History:  Diagnosis Date  . Antisocial personality disorder   . GERD (gastroesophageal reflux disease)   . Hyperlipidemia   . Schizophrenia (HCC)     History reviewed. No pertinent surgical history.  Family History: No family history on file.  Social History:  reports that he has been smoking Cigarettes.  He has been smoking about 1.00 pack per day. He has never used smokeless tobacco. He reports that he does not drink alcohol or use drugs.  Additional Social History:  Alcohol / Drug Use Pain Medications: See PTA Prescriptions: See PTA Over the Counter: See PTA History of alcohol / drug use?: No history of alcohol / drug abuse Longest period of sobriety (when/how long): n/a Negative Consequences of Use:  (n/a) Withdrawal  Symptoms:  (n/a)  CIWA: CIWA-Ar BP: (!) 132/98 Pulse Rate: 87 COWS:    Allergies:  Allergies  Allergen Reactions  . Bee Venom Hives    Home Medications:  (Not in a hospital admission)  OB/GYN Status:  No LMP for male patient.  General Assessment Data Location of Assessment: [facility] ED TTS Assessment: In system Is this a Tele or Face-to-Face Assessment?: Face-to-Face Is this an Initial Assessment or a Re-assessment for this encounter?: Initial Assessment Marital status: Single Maiden name: n/a Is patient pregnant?: No Pregnancy Status: No Living Arrangements: Group Home ([facility]) Can pt return to current living arrangement?: Yes Admission Status: Involuntary Is patient capable of signing voluntary admission?: No (Under IVC) Referral Source: Self/Family/Friend Insurance type: Medicaid  Medical Screening Exam [facility] Walk-in ONLY) Medical Exam completed: Yes  Crisis Care Plan Living Arrangements: Group Home ([facility]) Legal Guardian: Other: (Self) Name of Psychiatrist: Dr. Dolores Frame Name of Therapist: Reports of none  Education Status Is patient currently in school?: No Current Grade: n/a Highest grade of school patient has completed: GED Name of school: n/a Contact person: n/a  Risk to self with the past 6 months Suicidal Ideation: No Has patient been a risk to self within the past 6 months prior to admission? : No Suicidal Intent: No Has patient had any suicidal intent within the past 6 months prior to admission? : No Is patient at risk for suicide?: No Suicidal Plan?: No Has patient had any suicidal  plan within the past 6 months prior to admission? : No Access to Means: No What has been your use of drugs/alcohol within the last 12 months?: Reports of none Previous Attempts/Gestures: No How many times?: 0 Other Self Harm Risks: Reports of none Triggers for Past Attempts: None known Intentional Self Injurious Behavior: None Family Suicide  History: Unknown Recent stressful life event(s): Other (Comment) (Recent increase of AV/H) Persecutory voices/beliefs?: Yes Depression: Yes Depression Symptoms: Tearfulness, Fatigue, Feeling worthless/self pity, Guilt Substance abuse history and/or treatment for substance abuse?: No Suicide prevention information given to non-admitted patients: Not applicable  Risk to Others within the past 6 months Homicidal Ideation: No Does patient have any lifetime risk of violence toward others beyond the six months prior to admission? : No Thoughts of Harm to Others: No Current Homicidal Intent: No Current Homicidal Plan: No Access to Homicidal Means: No Identified Victim: Reports of none History of harm to others?: No Assessment of Violence: None Noted Violent Behavior Description: Reports of none Does patient have access to weapons?: No Criminal Charges Pending?: No Does patient have a court date: No Is patient on probation?: No  Psychosis Hallucinations: Auditory, Visual Delusions: None noted  Mental Status Report Appearance/Hygiene: Unremarkable, In scrubs Eye Contact: Fair Motor Activity: Freedom of movement, Unremarkable Speech: Logical/coherent, Unremarkable Level of Consciousness: Alert Mood: Depressed, Anxious, Sad, Helpless, Pleasant Affect: Appropriate to circumstance, Depressed, Sad, Anxious Anxiety Level: Minimal Thought Processes: Coherent, Relevant Judgement: Partial Orientation: Person, Place, Time, Situation, Appropriate for developmental age Obsessive Compulsive Thoughts/Behaviors: Minimal  Cognitive Functioning Concentration: Normal Memory: Recent Intact, Remote Intact IQ: Average Insight: Fair Impulse Control: Fair Appetite: Good Weight Loss: 0 Weight Gain: 0 Sleep: No Change Total Hours of Sleep: 8 Vegetative Symptoms: None  ADLScreening [facility] Assessment Services) Patient's cognitive ability adequate to safely complete daily activities?: Yes Patient  able to express need for assistance with ADLs?: Yes Independently performs ADLs?: Yes (appropriate for developmental age)  Prior Inpatient Therapy Prior Inpatient Therapy: Yes Prior Therapy Dates: 03/2012 & Other Dates unknown Prior Therapy Facilty/Provider(s): [facility] & "[facility]" Reason for Treatment: AV/H  Prior Outpatient Therapy Prior Outpatient Therapy: Yes Prior Therapy Dates: Current Prior Therapy Facilty/Provider(s): Dr. Dolores Frame (Psych MD) Reason for Treatment: Psychosis Does patient have an ACCT team?: No Does patient have Intensive In-House Services?  : No Does patient have [facility] services? : No Does patient have P4CC services?: No  ADL Screening (condition at time of admission) Patient's cognitive ability adequate to safely complete daily activities?: Yes Is the patient deaf or have difficulty hearing?: No Does the patient have difficulty seeing, even when wearing glasses/contacts?: No Does the patient have difficulty concentrating, remembering, or making decisions?: No Patient able to express need for assistance with ADLs?: Yes Does the patient have difficulty dressing or bathing?: No Independently performs ADLs?: Yes (appropriate for developmental age) Does the patient have difficulty walking or climbing stairs?: No Weakness of Legs: None Weakness of Arms/Hands: None  Home Assistive Devices/Equipment Home Assistive Devices/Equipment: None  Therapy Consults (therapy consults require a physician order) PT Evaluation Needed: No OT Evalulation Needed: No SLP Evaluation Needed: No Abuse/Neglect Assessment (Assessment to be complete while patient is alone) Physical Abuse: Denies Verbal Abuse: Denies Sexual Abuse: Denies Exploitation of patient/patient's resources: Denies Self-Neglect: Denies Values / Beliefs Cultural Requests During Hospitalization: None Spiritual Requests During Hospitalization: None Consults Spiritual Care Consult Needed:  No Social Work Consult Needed: No Merchant navy officer (For Healthcare) Does Patient Have a Medical Advance Directive?: No  Additional Information 1:1 In Past 12 Months?: No CIRT Risk: No Elopement Risk: No Does patient have medical clearance?: Yes  Child/Adolescent Assessment Running Away Risk: Denies (Patient is an adult)  Disposition:  Disposition Initial Assessment Completed for this Encounter: Yes Disposition of Patient: Other dispositions (ER MD Ordered Psych Consult)  On Site Evaluation by:   Reviewed with Physician:    Lilyan Gilford MS, LCAS, LPC, NCC, CCSI Therapeutic Triage Specialist 11/14/2016 11:04 PM

## 2016-11-14 NOTE — ED Triage Notes (Signed)
Patient comes in from his group home via [facility] with weakness and increased lethargic for the last 2-3 days. Patient is oriented to self and placed but not oriented to time. Patient states bill clinton is president. Patient is lethargic. Patient has history of schizophrenia. Per EMS patient was walking around when they pulled into pick him up.

## 2016-11-14 NOTE — ED Notes (Addendum)
Patient states he is having a "vision" right now. Patient states he normally sees things and he does take medication for it. Patient is seeing things more here lately. Patient does state he does hear things and they tell them to do bad things and tells him to hurt people.

## 2016-11-14 NOTE — ED Provider Notes (Signed)
[facility] Emergency Department Provider Note  ____________________________________________  Time seen: Approximately 8:04 PM  I have reviewed the triage vital signs and the nursing notes.   HISTORY  Chief Complaint Weakness    HPI Russell Buchanan is a 41 y.o. male from a group home with a history of antisocial personality disorder and schizophrenia, GERD and chronic vomiting, presenting with generalized weakness. The patient reports that for the past 2 or 3 days, he has been extremely weak. He reports several daily episodes of vomiting, but this has been going on for years and is unchanged. He also reports auditory hallucinations "I can't tell you what they tell me because it is so dark," but he did tell the nurse that the voices tell him to hurt other people. The patient denies any abdominal pain, constipation or diarrhea, fever or chills, cough or cold symptoms, urinary symptoms. He denies any changes in his medications, alcohol or drug abuse, or prescription drug overuse.  He denies SI or HI.   Past Medical History:  Diagnosis Date  . Antisocial personality disorder   . GERD (gastroesophageal reflux disease)   . Hyperlipidemia   . Schizophrenia (HCC)     There are no active problems to display for this patient.   History reviewed. No pertinent surgical history.  Current Outpatient Rx  . Order #: [id number] Class: Historical Med  . Order #: [id number] Class: Historical Med  . Order #: [id number] Class: Historical Med  . Order #: [id number] Class: Historical Med  . Order #: [id number] Class: Historical Med  . Order #: [id number] Class: Historical Med  . Order #: [id number] Class: Historical Med  . Order #: [id number] Class: Historical Med  . Order #: [id number] Class: Historical Med  . Order #: [id number] Class: Historical Med  . Order #: [id number] Class: Historical Med  . Order #: [id number] Class: Historical Med  . Order #: [id number] Class: Historical Med  . Order  #: [id number] Class: Historical Med  . Order #: [id number] Class: Print  . Order #: [id number] Class: Historical Med  . Order #: [id number] Class: Historical Med  . Order #: [id number] Class: Historical Med  . Order #: [id number] Class: Historical Med  . Order #: [id number] Class: Historical Med  . Order #: [id number] Class: Historical Med  . Order #: [id number] Class: Historical Med  . Order #: [id number] Class: Historical Med  . Order #: [id number] Class: Historical Med  . Order #: [id number] Class: Historical Med  . Order #: [id number] Class: Historical Med  . Order #: [id number] Class: Historical Med  . Order #: [id number] Class: Historical Med  . Order #: [id number] Class: Historical Med  . Order #: [id number] Class: Historical Med  . Order #: [id number] Class: Historical Med    Allergies Bee venom  No family history on file.  Social History Social History  Substance Use Topics  . Smoking status: Current Every Day Smoker    Packs/day: 1.00    Types: Cigarettes  . Smokeless tobacco: Never Used  . Alcohol use No    Review of Systems Constitutional: No fever/chills.No lightheadedness or syncope. Eyes: No visual changes. No blurred or double vision. ENT: No sore throat. No congestion or rhinorrhea. Cardiovascular: Denies chest pain. Denies palpitations. Respiratory: Denies shortness of breath.  No cough. Gastrointestinal: No abdominal pain.  Positive chronic nausea and vomiting which is unchanged..  No diarrhea.  No constipation. Genitourinary: Negative for dysuria. Musculoskeletal: Negative for back pain. Skin: Negative for rash. Neurological: Negative for headaches. No focal numbness, tingling or weakness.  Psychiatric:Positive auditory hallucinations. Denies SI or HI to me.   ____________________________________________   PHYSICAL EXAM:  VITAL SIGNS:  ED Triage Vitals  Enc Vitals Group     BP 11/14/16 1947 (!) 132/98     Pulse Rate 11/14/16 1947 87     Resp 11/14/16 1947 17     Temp  11/14/16 1947 97.8 F (36.6 C)     Temp Source 11/14/16 1947 Oral     SpO2 11/14/16 1947 97 %     Weight 11/14/16 1941 225 lb 12.8 oz (102.4 kg)     Height 11/14/16 1941 5\' 10"  (1.778 m)     Head Circumference --      Peak Flow --      Pain Score --      Pain Loc --      Pain Edu? --      Excl. in GC? --     Constitutional: Alert and oriented. Well appearing and in no acute distress. Answers questions appropriately. Eyes: Conjunctivae are normal.  EOMI. No scleral icterus. Head: Atraumatic. Nose: No congestion/rhinnorhea. Mouth/Throat: Mucous membranes are mildly dry.  Neck: No stridor.  Supple.  No JVD. No meningismus. Cardiovascular: Normal rate, regular rhythm. No murmurs, rubs or gallops.  Respiratory: Normal respiratory effort.  No accessory muscle use or retractions. Lungs CTAB.  No wheezes, rales or ronchi. Gastrointestinal: Soft, nontender and nondistended.  No guarding or rebound.  No peritoneal signs. Musculoskeletal: No LE edema.  Neurologic:  A&Ox3.  Speech is clear.  Face and smile are symmetric.  EOMI.  Moves all extremities well. Skin:  Skin is warm, dry and intact. No rash noted. Psychiatric: On examination, the patient reports auditory hallucinations but denies any SI or HI.  ____________________________________________   LABS (all labs ordered are listed, but only abnormal results are displayed)  Labs Reviewed  BASIC METABOLIC PANEL - Abnormal; Notable for the following:       Result Value   Glucose, Bld 111 (*)    All other components within normal limits  URINALYSIS, COMPLETE (UACMP) WITH MICROSCOPIC - Abnormal; Notable for the following:    Color, Urine YELLOW (*)    APPearance CLEAR (*)    Ketones, ur 5 (*)    Protein, ur 30 (*)    All other components within normal limits  ACETAMINOPHEN LEVEL - Abnormal; Notable for the following:    Acetaminophen (Tylenol), Serum <10 (*)    All other components within normal limits  URINE DRUG SCREEN, QUALITATIVE  ([facility] ONLY) - Abnormal; Notable for the following:    Tricyclic, Ur Screen POSITIVE (*)    Barbiturates, Ur Screen POSITIVE (*)    All other components within normal limits  CBC  ETHANOL  SALICYLATE LEVEL  TROPONIN I  HEPATIC FUNCTION PANEL  VALPROIC ACID LEVEL   ____________________________________________  EKG  ED ECG REPORT I, Rockne Menghini, the attending physician, personally viewed and interpreted this ECG.   Date: 11/14/2016  EKG Time: 2307  Rate: 79  Rhythm: normal sinus rhythm  Axis: normal  Intervals:none  ST&T Change: No STEMI  ____________________________________________  RADIOLOGY  No results found.  ____________________________________________   PROCEDURES  Procedure(s) performed: None  Procedures  Critical Care performed: No ____________________________________________   INITIAL IMPRESSION / ASSESSMENT AND PLAN / ED COURSE  Pertinent labs & imaging results that were available during my care of the patient were reviewed by me and considered in my medical decision making (see chart for details).  41 y.o. male with a history of schizophrenia and antisocial personality disorder presenting with generalized weakness, as well as auditory hallucinations. The patient has no  signs or symptoms of acute infection, which check a urinalysis; it do not suspect a pneumonia with a normal pulmonary exam and no cough or fever. We'll also check basic labs including sodium and potassium, as well as blood sugar has possible cause for his generalized weakness. I would consider polypharmacy as one possibility although the patient has not had any recent changes in his medications. We'll get a Depakote level. Plan reevaluation for final disposition.  ----------------------------------------- 11:01 PM on 11/14/2016 -----------------------------------------  The patient's workup in the emergency department has been reassuring and I have not found a cause for his  generalized weakness. Polypharmacy is a strong possibility. At this time, the patient is medically cleared for psychiatric disposition.  ____________________________________________  FINAL CLINICAL IMPRESSION(S) / ED DIAGNOSES  Final diagnoses:  Auditory hallucinations  Generalized weakness         NEW MEDICATIONS STARTED DURING THIS VISIT:  New Prescriptions   No medications on file      Rockne Menghini, MD 11/14/16 2330

## 2016-11-14 NOTE — ED Notes (Signed)
Patient standing beside bed using urinal.

## 2016-11-15 DIAGNOSIS — F209 Schizophrenia, unspecified: Secondary | ICD-10-CM

## 2016-11-15 DIAGNOSIS — F203 Undifferentiated schizophrenia: Secondary | ICD-10-CM

## 2016-11-15 DIAGNOSIS — T887XXA Unspecified adverse effect of drug or medicament, initial encounter: Secondary | ICD-10-CM

## 2016-11-15 NOTE — ED Notes (Signed)
Pt belongings given to pt. Pt is in bathroom changing and will be discharged to the lobby. A taxi will be called to take him back to his residence.

## 2016-11-15 NOTE — ED Notes (Signed)
Pt stated he had shoes on when he came in. Pt was placed in room 4 and transported to room 20B. Belongings did not include shoes. Pt will ask caregiver if he came in shoes and if so he will call back. BHU locker room checked twice for shoes.

## 2016-11-15 NOTE — ED Notes (Signed)
Pt belongings bag 1 of 1 placed at RN station.  

## 2016-11-15 NOTE — ED Provider Notes (Signed)
Patient was cleared by psychiatry for discharge.   Emily Filbert, MD 11/15/16 1316

## 2016-11-15 NOTE — ED Notes (Signed)
Pt pacing in corner of room.

## 2016-11-15 NOTE — ED Notes (Signed)
Pt given lunch tray.

## 2016-11-15 NOTE — ED Notes (Signed)
BEHAVIORAL HEALTH ROUNDING Patient sleeping: No. Patient alert and oriented: yes Behavior appropriate: Yes.  ; If no, describe:  Nutrition and fluids offered: yes Toileting and hygiene offered: Yes  Sitter present: q15 minute observations and security  monitoring Law enforcement present: Yes  ODS  

## 2016-11-15 NOTE — ED Notes (Signed)
Rescinded papers by Dr. Toni Amend

## 2016-11-15 NOTE — ED Notes (Signed)
ED Is the patient under IVC or is there intent for IVC: Yes.   Is the patient medically cleared: Yes.   Is there vacancy in the ED BHU: Yes.   Is the population mix appropriate for patient: Yes.   Is the patient awaiting placement in inpatient or outpatient setting:  Has the patient had a psychiatric consult:  Consult pending Survey of unit performed for contraband, proper placement and condition of furniture, tampering with fixtures in bathroom, shower, and each patient room: Yes.  ; Findings:  APPEARANCE/BEHAVIOR Calm and cooperative NEURO ASSESSMENT Orientation: oriented to self- place -situation Denies pain Hallucinations: yes he verbalized that he has Command - auditory   (Hallucinations) Speech: Normal Gait: normal - pacing - fast at times  RESPIRATORY ASSESSMENT Even  Unlabored respirations  CARDIOVASCULAR ASSESSMENT Pulses equal   regular rate  Skin warm and dry   GASTROINTESTINAL ASSESSMENT no GI complaint EXTREMITIES Full ROM  PLAN OF CARE Provide calm/safe environment. Vital signs assessed twice daily. ED BHU Assessment once each 12-hour shift. Collaborate with TTS daily or as condition indicates. Assure the ED provider has rounded once each shift. Provide and encourage hygiene. Provide redirection as needed. Assess for escalating behavior; address immediately and inform ED provider.  Assess family dynamic and appropriateness for visitation as needed: Yes.  ; If necessary, describe findings:  Educate the patient/family about BHU procedures/visitation: Yes.  ; If necessary, describe findings:

## 2016-11-15 NOTE — ED Notes (Signed)

## 2016-11-15 NOTE — ED Notes (Signed)
Patient observed lying in bed with eyes closed  Even, unlabored respirations observed   NAD pt appears to be sleeping  I will continue to monitor along with every 15 minute visual observations and ongoing security monitoring    

## 2016-11-15 NOTE — ED Notes (Signed)
Pt given breakfast tray

## 2016-11-15 NOTE — ED Notes (Signed)
?  concern that he is pacing in his room - he is sharing the room with another pt so I entered to check on them - pt reports that the other man in the room told him that he could walk around  - the other man stated - yes he is not bothering me   I answered Russell Buchanan questions and we discussed the plan of care which includes a pending psych evaluation - he verbalized agreement  - no am meds ordered at this time  "the seroquel really helps me stay calm - it does help the commands. - I don't think I need med changes cause Dr Omelia Blackwater will be upset if we make changes."  I reassured him that he will be able to talk with the MD about his meds and any changes before they occur  He verbalizes being appreciative - pacing around room to be monitored closely - so that he does not get too worked up    Assessment completed

## 2016-11-15 NOTE — ED Notes (Signed)
Pt walking around room. Pt given sprite

## 2016-11-15 NOTE — Consult Note (Signed)
[facility] Consult   Reason for Consult:  Consult for 41 year old man with schizophrenia who came to the emergency room because of concerns about unsteadiness and falls Referring Physician:  [name] Patient Identification: Russell Buchanan MRN:  [id number] Principal Diagnosis: Schizophrenia [facility]) Diagnosis:   Patient Active Problem List   Diagnosis Date Noted  . Schizophrenia ([place]) [F20.9] 11/15/2016  . Medication side effects [T88.7XXA] 11/15/2016    Total Time spent with patient: 1 hour  Subjective:   Russell Buchanan is a 41 y.o. male patient admitted with "I was worried the Seroquel was too much".  HPI:  Patient interviewed chart reviewed. 41 year old man with a history of schizophrenia. Came to the emergency room evidently with a chief concern about recent dizziness and falls. Patient said he is falling down several times recently. His history is a little disorganized but he seems to be indicating that he's been having orthostatic changes where he will feel dizzy when he stands up abruptly. He is unclear about exactly what the time course of this is. Psychiatrically he says that his symptoms are about the same as always. He says he hears voices all of the time they never really get any better. Denies that they are currently bothering him. Apparently part of the reason for the psychiatric consult was that he had told people earlier that the voices told him "dark" things. He denies that to me. Denies suicidal or homicidal ideation. Denies that his mood is a problem. Denies thoughts of harming self or anyone else.  History of Axid reflux. Seems to of recently had some dizziness of unclear etiology.  Social history: Patient says that he has never met his family and has no contact with his family of origin. Has a guardian apparently lives in a group home has been there for years. Has no complaints about it.  Substance abuse history: He denies any current or past alcohol or  drug abuse problems  Past Psychiatric History: Patient has a history of schizophrenia. Long-standing. Has been on multiple medicines. He has had hospitalizations in the past. Denies any past suicide attempts.  Risk to Self: Suicidal Ideation: No Suicidal Intent: No Is patient at risk for suicide?: No Suicidal Plan?: No Access to Means: No What has been your use of drugs/alcohol within the last 12 months?: Reports of none How many times?: 0 Other Self Harm Risks: Reports of none Triggers for Past Attempts: None known Intentional Self Injurious Behavior: None Risk to Others: Homicidal Ideation: No Thoughts of Harm to Others: No Current Homicidal Intent: No Current Homicidal Plan: No Access to Homicidal Means: No Identified Victim: Reports of none History of harm to others?: No Assessment of Violence: None Noted Violent Behavior Description: Reports of none Does patient have access to weapons?: No Criminal Charges Pending?: No Does patient have a court date: No Prior Inpatient Therapy: Prior Inpatient Therapy: Yes Prior Therapy Dates: 03/2012 & Other Dates unknown Prior Therapy Facilty/Provider(s): [facility] & [facility]" Reason for Treatment: AV/H Prior Outpatient Therapy: Prior Outpatient Therapy: Yes Prior Therapy Dates: Current Prior Therapy Facilty/Provider(s): Dr. Bernita Raisin (Psych MD) Reason for Treatment: Psychosis Does patient have an ACCT team?: No Does patient have Intensive In-House Services?  : No Does patient have [facility] services? : No Does patient have P4CC services?: No  Past Medical History:  Past Medical History:  Diagnosis Date  . Antisocial personality disorder   . GERD (gastroesophageal reflux disease)   . Hyperlipidemia   . Schizophrenia ([place])  History reviewed. No pertinent surgical history. Family History: No family history on file. Family Psychiatric  History: He has no idea about family history Social History:  History  Alcohol  Use No     History  Drug Use No    Social History   Social History  . Marital status: Single    Spouse name: N/A  . Number of children: N/A  . Years of education: N/A   Social History Main Topics  . Smoking status: Current Every Day Smoker    Packs/day: 1.00    Types: Cigarettes  . Smokeless tobacco: Never Used  . Alcohol use No  . Drug use: No  . Sexual activity: Not Currently   Other Topics Concern  . None   Social History Narrative  . None   Additional Social History:    Allergies:   Allergies  Allergen Reactions  . Bee Venom Hives    Labs:  Results for orders placed or performed during the hospital encounter of 11/14/16 (from the past 48 hour(s))  Basic metabolic panel     Status: Abnormal   Collection Time: 11/14/16  7:48 PM  Result Value Ref Range   Sodium 137 135 - 145 mmol/L   Potassium 3.9 3.5 - 5.1 mmol/L   Chloride 101 101 - 111 mmol/L   CO2 27 22 - 32 mmol/L   Glucose, Bld 111 (H) 65 - 99 mg/dL   BUN 11 6 - 20 mg/dL   Creatinine, Ser 1.23 0.61 - 1.24 mg/dL   Calcium 9.6 8.9 - 10.3 mg/dL   GFR calc non Af Amer >60 >60 mL/min   GFR calc Af Amer >60 >60 mL/min    Comment: (NOTE) The eGFR has been calculated using the CKD EPI equation. This calculation has not been validated in all clinical situations. eGFR's persistently <60 mL/min signify possible Chronic Kidney Disease.    Anion gap 9 5 - 15  CBC     Status: None   Collection Time: 11/14/16  7:48 PM  Result Value Ref Range   WBC 8.9 3.8 - 10.6 K/uL   RBC 5.42 4.40 - 5.90 MIL/uL   Hemoglobin 16.4 13.0 - 18.0 g/dL   HCT 47.6 40.0 - 52.0 %   MCV 87.9 80.0 - 100.0 fL   MCH 30.3 26.0 - 34.0 pg   MCHC 34.5 32.0 - 36.0 g/dL   RDW 14.1 11.5 - 14.5 %   Platelets 212 150 - 440 K/uL  Ethanol     Status: None   Collection Time: 11/14/16  7:48 PM  Result Value Ref Range   Alcohol, Ethyl (B) <5 <5 mg/dL    Comment:        LOWEST DETECTABLE LIMIT FOR SERUM ALCOHOL IS 5 mg/dL FOR MEDICAL  PURPOSES ONLY   Salicylate level     Status: None   Collection Time: 11/14/16  7:48 PM  Result Value Ref Range   Salicylate Lvl <7.3 2.8 - 30.0 mg/dL  Acetaminophen level     Status: Abnormal   Collection Time: 11/14/16  7:48 PM  Result Value Ref Range   Acetaminophen (Tylenol), Serum <10 (L) 10 - 30 ug/mL    Comment:        THERAPEUTIC CONCENTRATIONS VARY SIGNIFICANTLY. A RANGE OF 10-30 ug/mL MAY BE AN EFFECTIVE CONCENTRATION FOR MANY PATIENTS. HOWEVER, SOME ARE BEST TREATED AT CONCENTRATIONS OUTSIDE THIS RANGE. ACETAMINOPHEN CONCENTRATIONS >150 ug/mL AT 4 HOURS AFTER INGESTION AND >50 ug/mL AT 12 HOURS AFTER INGESTION ARE OFTEN ASSOCIATED  WITH TOXIC REACTIONS.   Troponin I     Status: None   Collection Time: 11/14/16  7:48 PM  Result Value Ref Range   Troponin I <0.03 <0.03 ng/mL  Hepatic function panel     Status: None   Collection Time: 11/14/16  7:48 PM  Result Value Ref Range   Total Protein 7.4 6.5 - 8.1 g/dL   Albumin 4.3 3.5 - 5.0 g/dL   AST 28 15 - 41 U/L   ALT 23 17 - 63 U/L   Alkaline Phosphatase 76 38 - 126 U/L   Total Bilirubin 0.6 0.3 - 1.2 mg/dL   Bilirubin, Direct 0.1 0.1 - 0.5 mg/dL   Indirect Bilirubin 0.5 0.3 - 0.9 mg/dL  Valproic acid level     Status: None   Collection Time: 11/14/16  7:48 PM  Result Value Ref Range   Valproic Acid Lvl 78 50.0 - 100.0 ug/mL  Urinalysis, Complete w Microscopic     Status: Abnormal   Collection Time: 11/14/16 10:23 PM  Result Value Ref Range   Color, Urine YELLOW (A) YELLOW   APPearance CLEAR (A) CLEAR   Specific Gravity, Urine 1.011 1.005 - 1.030   pH 7.0 5.0 - 8.0   Glucose, UA NEGATIVE NEGATIVE mg/dL   Hgb urine dipstick NEGATIVE NEGATIVE   Bilirubin Urine NEGATIVE NEGATIVE   Ketones, ur 5 (A) NEGATIVE mg/dL   Protein, ur 30 (A) NEGATIVE mg/dL   Nitrite NEGATIVE NEGATIVE   Leukocytes, UA NEGATIVE NEGATIVE   RBC / HPF 0-5 0 - 5 RBC/hpf   WBC, UA NONE SEEN 0 - 5 WBC/hpf   Bacteria, UA NONE SEEN NONE  SEEN   Squamous Epithelial / LPF NONE SEEN NONE SEEN   Mucous PRESENT   Urine Drug Screen, Qualitative     Status: Abnormal   Collection Time: 11/14/16 10:23 PM  Result Value Ref Range   Tricyclic, Ur Screen POSITIVE (A) NONE DETECTED   Amphetamines, Ur Screen NONE DETECTED NONE DETECTED   MDMA (Ecstasy)Ur Screen NONE DETECTED NONE DETECTED   Cocaine Metabolite,Ur [place] NONE DETECTED NONE DETECTED   Opiate, Ur Screen NONE DETECTED NONE DETECTED   Phencyclidine (PCP) Ur S NONE DETECTED NONE DETECTED   Cannabinoid 50 Ng, Ur [place] NONE DETECTED NONE DETECTED   Barbiturates, Ur Screen POSITIVE (A) NONE DETECTED   Benzodiazepine, Ur Scrn NONE DETECTED NONE DETECTED   Methadone Scn, Ur NONE DETECTED NONE DETECTED    Comment: (NOTE) 915  Tricyclics, urine               Cutoff 1000 ng/mL 200  Amphetamines, urine             Cutoff 1000 ng/mL 300  MDMA (Ecstasy), urine           Cutoff 500 ng/mL 400  Cocaine Metabolite, urine       Cutoff 300 ng/mL 500  Opiate, urine                   Cutoff 300 ng/mL 600  Phencyclidine (PCP), urine      Cutoff 25 ng/mL 700  Cannabinoid, urine              Cutoff 50 ng/mL 800  Barbiturates, urine             Cutoff 200 ng/mL 900  Benzodiazepine, urine           Cutoff 200 ng/mL 1000 Methadone, urine  Cutoff 300 ng/mL 1100 1200 The urine drug screen provides only a preliminary, unconfirmed 1300 analytical test result and should not be used for non-medical 1400 purposes. Clinical consideration and professional judgment should 1500 be applied to any positive drug screen result due to possible 1600 interfering substances. A more specific alternate chemical method 1700 must be used in order to obtain a confirmed analytical result.  1800 Gas chromato graphy / mass spectrometry (GC/MS) is the preferred 1900 confirmatory method.     No current facility-administered medications for this encounter.    Current Outpatient Prescriptions  Medication Sig  Dispense Refill  . albuterol (PROVENTIL HFA;VENTOLIN HFA) 108 (90 Base) MCG/ACT inhaler Inhale 1-2 puffs into the lungs every 4 (four) hours as needed for wheezing or shortness of breath.    . benztropine (COGENTIN) 1 MG tablet Take 1 mg by mouth 2 (two) times daily as needed.     . bismuth subsalicylate (PEPTO BISMOL) 262 MG/15ML suspension Take 30 mLs by mouth every 6 (six) hours as needed.    Marland Kitchen buPROPion (WELLBUTRIN XL) 150 MG 24 hr tablet Take 150 mg by mouth daily.    . Butalbital-APAP-Caffeine 50-325-40 MG per capsule Take 1 capsule by mouth 2 (two) times daily as needed.     . cetirizine (ZYRTEC) 10 MG tablet Take 10 mg by mouth daily.    . chlorproMAZINE (THORAZINE) 200 MG tablet Take 200 mg by mouth at bedtime.     . clotrimazole-betamethasone (LOTRISONE) cream Apply 1 application topically 3 (three) times daily.    . divalproex (DEPAKOTE ER) 500 MG 24 hr tablet Take 500-1,000 mg by mouth 2 (two) times daily. Take 500 mg oral by mouth in the morning and take 1000 mg by mouth at bedtime.    . docusate sodium (COLACE) 100 MG capsule Take 100 mg by mouth daily.    Marland Kitchen EPINEPHrine 0.3 mg/0.3 mL IJ SOAJ injection Inject 0.015 mg into the muscle as needed.    Marland Kitchen escitalopram (LEXAPRO) 20 MG tablet Take 20 mg by mouth daily.    Marland Kitchen gemfibrozil (LOPID) 600 MG tablet Take 600 mg by mouth 2 (two) times daily before a meal.    . haloperidol (HALDOL) 10 MG tablet Take 10 mg by mouth 2 (two) times daily as needed.     Marland Kitchen ketoconazole (NIZORAL) 2 % cream Apply 1 application topically 2 (two) times daily.    . lipase/protease/amylase (CREON) 36000 UNITS CPEP capsule Take 81,191-47,829 Units by mouth See admin instructions. tk 2 capsules with meals, and 1 with snacks.    Marland Kitchen LORazepam (ATIVAN) 1 MG tablet Take 1 mg by mouth every 8 (eight) hours as needed for anxiety.    . magnesium gluconate (MAGONATE) 500 MG tablet Take 500 mg by mouth daily.    Marland Kitchen nystatin cream (MYCOSTATIN) Apply 1 application topically 2  (two) times daily as needed for dry skin.     Marland Kitchen ondansetron (ZOFRAN) 8 MG tablet Take 8 mg by mouth every 8 (eight) hours as needed.     . Paliperidone Palmitate (INVEGA TRINZA) 819 MG/2.625ML SUSP Inject 1 Dose into the muscle.    . pantoprazole (PROTONIX) 40 MG tablet Take 40 mg by mouth 2 (two) times daily.     . promethazine (PHENERGAN) 25 MG tablet Take 25 mg by mouth every 6 (six) hours as needed for nausea or vomiting.    . propranolol (INDERAL) 20 MG tablet Take 20 mg by mouth every 8 (eight) hours as needed (TREMORS).     Marland Kitchen  propranolol (INNOPRAN XL) 80 MG 24 hr capsule Take 80 mg by mouth daily.    . QUEtiapine (SEROQUEL XR) 400 MG 24 hr tablet Take 800 mg by mouth at bedtime.    Marland Kitchen QUEtiapine (SEROQUEL) 200 MG tablet Take 200 mg by mouth at bedtime.    . risperidone (RISPERDAL) 4 MG tablet Take 2 mg by mouth 2 (two) times daily as needed.     . SUMAtriptan (IMITREX) 100 MG tablet Take 100 mg by mouth every 2 (two) hours as needed for migraine. May repeat in 2 hours if headache persists or recurs.    Marland Kitchen tiotropium (SPIRIVA) 18 MCG inhalation capsule Place 18 mcg into inhaler and inhale daily.    Marland Kitchen lidocaine (LIDODERM) 5 % Place 1 patch onto the skin every 12 (twelve) hours. Remove & Discard patch within 12 hours or as directed by MD.  [place] Proud the patch off for 12 hours before applying a new one. (Patient not taking: Reported on 11/08/2016) 10 patch 0    Musculoskeletal: Strength & Muscle Tone: within normal limits Gait & Station: normal Patient leans: N/A  Psychiatric Specialty Exam: Physical Exam  Nursing note and vitals reviewed. Constitutional: He appears well-developed and well-nourished.  HENT:  Head: Normocephalic and atraumatic.  Eyes: Conjunctivae are normal. Pupils are equal, round, and reactive to light.  Neck: Normal range of motion.  Cardiovascular: Regular rhythm and normal heart sounds.   Respiratory: Effort normal. No respiratory distress.  GI: Soft.  Musculoskeletal:  Normal range of motion.  Neurological: He is alert.  Skin: Skin is warm and dry.  Psychiatric: Judgment normal. His affect is blunt. His speech is delayed. He is slowed. Thought content is not paranoid. He expresses no homicidal and no suicidal ideation. He exhibits abnormal recent memory.    Review of Systems  Constitutional: Negative.   HENT: Negative.   Eyes: Negative.   Respiratory: Negative.   Cardiovascular: Negative.   Gastrointestinal: Negative.   Musculoskeletal: Positive for falls.  Skin: Negative.   Neurological: Positive for dizziness.  Psychiatric/Behavioral: Positive for hallucinations. Negative for depression, memory loss, substance abuse and suicidal ideas. The patient is not nervous/anxious and does not have insomnia.     Blood pressure 112/70, pulse 86, temperature 98.5 F (36.9 C), temperature source Oral, resp. rate 16, height '5\' 10"'  (1.778 m), weight 102.4 kg (225 lb 12.8 oz), SpO2 98 %.Body mass index is 32.4 kg/m.  General Appearance: Fairly Groomed  Eye Contact:  Minimal  Speech:  Slow  Volume:  Decreased  Mood:  Euthymic  Affect:  Constricted  Thought Process:  Disorganized  Orientation:  Full (Time, Place, and Person)  Thought Content:  Illogical, Rumination and Tangential  Suicidal Thoughts:  No  Homicidal Thoughts:  No  Memory:  Immediate;   Fair Recent;   Fair Remote;   Fair  Judgement:  Fair  Insight:  Shallow  Psychomotor Activity:  Decreased  Concentration:  Concentration: Fair  Recall:  [employer] of Knowledge:  Fair  Language:  Fair  Akathisia:  No  Handed:  Right  AIMS (if indicated):     Assets:  Desire for Improvement Housing Physical Health Resilience  ADL's:  Intact  Cognition:  Impaired,  Mild  Sleep:        Treatment Plan Summary: Plan 41 year old man with schizophrenia. He was having some falls earlier. Medically he is cleared at this point. Unclear if it could've been higher doses of medicine. Patient psychiatrically  appears to be quite stable.  He is denying any suicidal or homicidal thoughts. He is able to carry on a semi-lucid conversation but was not bizarre or disorganized. Able to focus on at least basic activities of daily living. Has chronic hallucinations but is not disturbed by them. Patient has appropriate outpatient care in the community. No need for hospital level treatment. Patient can be taken off of involuntary commitment and released back to his group home to continue follow-up with Dr. Rosine Door. No new prescriptions completed. Case reviewed with emergency room physician.  Disposition: Patient does not meet criteria for psychiatric inpatient admission. Supportive therapy provided about ongoing stressors.  Alethia Berthold, MD 11/15/2016 8:47 PM

## 2016-11-26 ENCOUNTER — Emergency Department
Admission: EM | Admit: 2016-11-26 | Discharge: 2016-11-27 | Disposition: A | Discharge status: Home / Self Care | Payer: Medicaid Other | Attending: Emergency Medicine | Admitting: Emergency Medicine

## 2016-11-26 ENCOUNTER — Encounter: Payer: Self-pay | Admitting: Emergency Medicine

## 2016-11-26 ENCOUNTER — Emergency Department: Payer: Medicaid Other

## 2016-11-26 DIAGNOSIS — F17210 Nicotine dependence, cigarettes, uncomplicated: Secondary | ICD-10-CM | POA: Diagnosis not present

## 2016-11-26 DIAGNOSIS — R443 Hallucinations, unspecified: Secondary | ICD-10-CM | POA: Diagnosis present

## 2016-11-26 DIAGNOSIS — Z79899 Other long term (current) drug therapy: Secondary | ICD-10-CM | POA: Insufficient documentation

## 2016-11-26 DIAGNOSIS — R4182 Altered mental status, unspecified: Secondary | ICD-10-CM | POA: Insufficient documentation

## 2016-11-26 DIAGNOSIS — R258 Other abnormal involuntary movements: Secondary | ICD-10-CM

## 2016-11-26 DIAGNOSIS — R259 Unspecified abnormal involuntary movements: Secondary | ICD-10-CM | POA: Diagnosis not present

## 2016-11-26 LAB — BLOOD GAS, VENOUS
ACID-BASE EXCESS: 3 mmol/L — AB (ref 0.0–2.0)
BICARBONATE: 27.1 mmol/L (ref 20.0–28.0)
O2 SAT: 89.7 %
PCO2 VEN: 39 mmHg — AB (ref 44.0–60.0)
PH VEN: 7.45 — AB (ref 7.250–7.430)
PO2 VEN: 55 mmHg — AB (ref 32.0–45.0)
Patient temperature: 37

## 2016-11-26 LAB — BASIC METABOLIC PANEL
Anion gap: 11 (ref 5–15)
BUN: 9 mg/dL (ref 6–20)
CO2: 24 mmol/L (ref 22–32)
CREATININE: 0.87 mg/dL (ref 0.61–1.24)
Calcium: 9.8 mg/dL (ref 8.9–10.3)
Chloride: 100 mmol/L — ABNORMAL LOW (ref 101–111)
Glucose, Bld: 113 mg/dL — ABNORMAL HIGH (ref 65–99)
POTASSIUM: 4 mmol/L (ref 3.5–5.1)
SODIUM: 135 mmol/L (ref 135–145)

## 2016-11-26 LAB — CBC
HEMATOCRIT: 43.6 % (ref 40.0–52.0)
Hemoglobin: 15.3 g/dL (ref 13.0–18.0)
MCH: 30.4 pg (ref 26.0–34.0)
MCHC: 35.1 g/dL (ref 32.0–36.0)
MCV: 86.8 fL (ref 80.0–100.0)
PLATELETS: 343 10*3/uL (ref 150–440)
RBC: 5.02 MIL/uL (ref 4.40–5.90)
RDW: 14.1 % (ref 11.5–14.5)
WBC: 9.5 10*3/uL (ref 3.8–10.6)

## 2016-11-26 LAB — ACETAMINOPHEN LEVEL

## 2016-11-26 LAB — SALICYLATE LEVEL

## 2016-11-26 LAB — ETHANOL: Alcohol, Ethyl (B): <5 mg/dL (ref <5)

## 2016-11-26 NOTE — ED Notes (Signed)
Pt very sleepy upon assessment. Pt wakes up when verbally prompted. Pt soon after falls right back asleep. Pt was able to follow commands appropriately.
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
## 2016-11-27 LAB — URINALYSIS, COMPLETE (UACMP) WITH MICROSCOPIC
Bacteria, UA: NONE SEEN
Bilirubin Urine: NEGATIVE
GLUCOSE, UA: NEGATIVE mg/dL
HGB URINE DIPSTICK: NEGATIVE
Ketones, ur: NEGATIVE mg/dL
Leukocytes, UA: NEGATIVE
NITRITE: NEGATIVE
Protein, ur: NEGATIVE mg/dL
RBC / HPF: NONE SEEN RBC/hpf (ref 0–5)
SPECIFIC GRAVITY, URINE: 1.004 — AB (ref 1.005–1.030)
pH: 7 (ref 5.0–8.0)

## 2016-11-27 LAB — URINE DRUG SCREEN, QUALITATIVE (ARMC ONLY)
Amphetamines, Ur Screen: NOT DETECTED
Barbiturates, Ur Screen: NOT DETECTED
Benzodiazepine, Ur Scrn: NOT DETECTED
CANNABINOID 50 NG, UR ~~LOC~~: NOT DETECTED
COCAINE METABOLITE, UR ~~LOC~~: NOT DETECTED
MDMA (Ecstasy)Ur Screen: NOT DETECTED
Methadone Scn, Ur: NOT DETECTED
OPIATE, UR SCREEN: NOT DETECTED
PHENCYCLIDINE (PCP) UR S: NOT DETECTED
Tricyclic, Ur Screen: POSITIVE — AB

## 2016-11-27 MED ORDER — SODIUM CHLORIDE 0.9 % IV BOLUS (SEPSIS)
1000.0000 mL | Freq: Once | INTRAVENOUS | Status: AC
  Administered 2016-11-27: 1000 mL via INTRAVENOUS

## 2016-11-27 NOTE — Discharge Instructions (Signed)
Please return to the Emergency Department (ED)  immediately if you have ANY thoughts of hurting yourself or anyone else, so that we may help you.  Please avoid alcohol and drug use.  Follow up with your doctor and/or therapist as soon as possible regarding today's ED  visit.   You may call crisis hotline for [facility] at [phone].

## 2016-11-27 NOTE — ED Notes (Signed)
Pt up to toilet 

## 2016-12-04 ENCOUNTER — Emergency Department: Payer: Medicaid Other

## 2016-12-04 ENCOUNTER — Observation Stay
Admission: EM | Admit: 2016-12-04 | Discharge: 2016-12-06 | Disposition: A | Discharge status: Home / Self Care | Payer: Medicaid Other | Attending: Specialist | Admitting: Specialist

## 2016-12-04 ENCOUNTER — Encounter: Payer: Self-pay | Admitting: Internal Medicine

## 2016-12-04 DIAGNOSIS — J449 Chronic obstructive pulmonary disease, unspecified: Secondary | ICD-10-CM | POA: Diagnosis not present

## 2016-12-04 DIAGNOSIS — S51011A Laceration without foreign body of right elbow, initial encounter: Secondary | ICD-10-CM | POA: Insufficient documentation

## 2016-12-04 DIAGNOSIS — E785 Hyperlipidemia, unspecified: Secondary | ICD-10-CM | POA: Insufficient documentation

## 2016-12-04 DIAGNOSIS — K861 Other chronic pancreatitis: Secondary | ICD-10-CM | POA: Insufficient documentation

## 2016-12-04 DIAGNOSIS — W19XXXA Unspecified fall, initial encounter: Secondary | ICD-10-CM | POA: Insufficient documentation

## 2016-12-04 DIAGNOSIS — Z79899 Other long term (current) drug therapy: Secondary | ICD-10-CM | POA: Diagnosis not present

## 2016-12-04 DIAGNOSIS — F17210 Nicotine dependence, cigarettes, uncomplicated: Secondary | ICD-10-CM | POA: Diagnosis not present

## 2016-12-04 DIAGNOSIS — F209 Schizophrenia, unspecified: Secondary | ICD-10-CM | POA: Diagnosis not present

## 2016-12-04 DIAGNOSIS — G9340 Encephalopathy, unspecified: Principal | ICD-10-CM | POA: Diagnosis present

## 2016-12-04 DIAGNOSIS — K219 Gastro-esophageal reflux disease without esophagitis: Secondary | ICD-10-CM | POA: Diagnosis not present

## 2016-12-04 DIAGNOSIS — G43909 Migraine, unspecified, not intractable, without status migrainosus: Secondary | ICD-10-CM | POA: Insufficient documentation

## 2016-12-04 DIAGNOSIS — Z91030 Bee allergy status: Secondary | ICD-10-CM | POA: Diagnosis not present

## 2016-12-04 DIAGNOSIS — R4182 Altered mental status, unspecified: Secondary | ICD-10-CM | POA: Diagnosis present

## 2016-12-04 DIAGNOSIS — R2681 Unsteadiness on feet: Secondary | ICD-10-CM | POA: Diagnosis not present

## 2016-12-04 DIAGNOSIS — R531 Weakness: Secondary | ICD-10-CM | POA: Insufficient documentation

## 2016-12-04 DIAGNOSIS — R296 Repeated falls: Secondary | ICD-10-CM | POA: Insufficient documentation

## 2016-12-04 LAB — CBC WITH DIFFERENTIAL/PLATELET
BASOS ABS: 0 10*3/uL (ref 0–0.1)
Basophils Relative: 1 %
EOS ABS: 0 10*3/uL (ref 0–0.7)
EOS PCT: 0 %
HCT: 44 % (ref 40.0–52.0)
Hemoglobin: 15.3 g/dL (ref 13.0–18.0)
Lymphocytes Relative: 38 %
Lymphs Abs: 3 10*3/uL (ref 1.0–3.6)
MCH: 30.4 pg (ref 26.0–34.0)
MCHC: 34.6 g/dL (ref 32.0–36.0)
MCV: 87.7 fL (ref 80.0–100.0)
Monocytes Absolute: 0.9 10*3/uL (ref 0.2–1.0)
Monocytes Relative: 12 %
Neutro Abs: 3.9 10*3/uL (ref 1.4–6.5)
Neutrophils Relative %: 49 %
PLATELETS: 280 10*3/uL (ref 150–440)
RBC: 5.02 MIL/uL (ref 4.40–5.90)
RDW: 14.1 % (ref 11.5–14.5)
WBC: 7.9 10*3/uL (ref 3.8–10.6)

## 2016-12-04 LAB — COMPREHENSIVE METABOLIC PANEL
ALBUMIN: 3.6 g/dL (ref 3.5–5.0)
ALT: 12 U/L — ABNORMAL LOW (ref 17–63)
AST: 22 U/L (ref 15–41)
Alkaline Phosphatase: 85 U/L (ref 38–126)
Anion gap: 10 (ref 5–15)
BUN: 9 mg/dL (ref 6–20)
CHLORIDE: 105 mmol/L (ref 101–111)
CO2: 25 mmol/L (ref 22–32)
Calcium: 9.3 mg/dL (ref 8.9–10.3)
Creatinine, Ser: 1.01 mg/dL (ref 0.61–1.24)
GFR calc Af Amer: >60 mL/min (ref >60)
Glucose, Bld: 104 mg/dL — ABNORMAL HIGH (ref 65–99)
POTASSIUM: 4 mmol/L (ref 3.5–5.1)
Sodium: 140 mmol/L (ref 135–145)
Total Bilirubin: 0.5 mg/dL (ref 0.3–1.2)
Total Protein: 6.6 g/dL (ref 6.5–8.1)

## 2016-12-04 LAB — TROPONIN I

## 2016-12-04 LAB — SALICYLATE LEVEL

## 2016-12-04 LAB — VALPROIC ACID LEVEL: VALPROIC ACID LVL: 71 ug/mL (ref 50.0–100.0)

## 2016-12-04 LAB — GLUCOSE, CAPILLARY: GLUCOSE-CAPILLARY: 111 mg/dL — AB (ref 65–99)

## 2016-12-04 LAB — ETHANOL

## 2016-12-04 LAB — ACETAMINOPHEN LEVEL

## 2016-12-04 MED ORDER — ONDANSETRON 8 MG PO TBDP
8.0000 mg | ORAL_TABLET | Freq: Once | ORAL | Status: AC
  Administered 2016-12-04: 8 mg via ORAL
  Filled 2016-12-04: qty 1

## 2016-12-04 MED ORDER — SODIUM CHLORIDE 0.9 % IV BOLUS (SEPSIS)
1000.0000 mL | Freq: Once | INTRAVENOUS | Status: AC
  Administered 2016-12-04: 1000 mL via INTRAVENOUS

## 2016-12-04 MED ORDER — TETANUS-DIPHTH-ACELL PERTUSSIS 5-2.5-18.5 LF-MCG/0.5 IM SUSP
0.5000 mL | Freq: Once | INTRAMUSCULAR | Status: AC
  Administered 2016-12-05: 0.5 mL via INTRAMUSCULAR
  Filled 2016-12-04: qty 0.5

## 2016-12-04 NOTE — ED Provider Notes (Addendum)
[facility] Emergency Department Provider Note  ____________________________________________   I have reviewed the triage vital signs and the nursing notes.   HISTORY  Chief Complaint Fall and Extremity Laceration    HPI Russell Buchanan is a 41 y.o. male with a history of seizure disorder in the past who presents today complaining of confusion. He was probably 7 a week ago with seizure-like activity but it was thought not to be seizures because he was awake and remembered the events. However today he has no recollection of what happened and comes in altered. He does have an abrasion/laceration to his right elbow he cannot tell me how long its been there. It is fully dressed however. The patient has had no clear idea when his last tetanus shot was. He was initially somewhat confused and remains somewhat confused. He does state he did have a seizure disorder in the past. He denies any other trauma or injury and he has no idea when he injured his elbow or even if it was today.      Past Medical History:  Diagnosis Date  . Antisocial personality disorder   . GERD (gastroesophageal reflux disease)   . Hyperlipidemia   . Schizophrenia [facility])     Patient Active Problem List   Diagnosis Date Noted  . Schizophrenia (HCC) 11/15/2016  . Medication side effects 11/15/2016    No past surgical history on file.  Prior to Admission medications   Medication Sig Start Date End Date Taking? Authorizing Provider  albuterol (PROVENTIL HFA;VENTOLIN HFA) 108 (90 Base) MCG/ACT inhaler Inhale 1-2 puffs into the lungs every 4 (four) hours as needed for wheezing or shortness of breath.    [provider]  benztropine (COGENTIN) 1 MG tablet Take 1 mg by mouth 2 (two) times daily as needed.     [provider]  bismuth subsalicylate (PEPTO BISMOL) 262 MG/15ML suspension Take 30 mLs by mouth every 6 (six) hours as needed.    [provider]  buPROPion  (WELLBUTRIN XL) 150 MG 24 hr tablet Take 150 mg by mouth daily.    [provider]  Butalbital-APAP-Caffeine 50-325-40 MG per capsule Take 1 capsule by mouth 2 (two) times daily as needed.     [provider]  cetirizine (ZYRTEC) 10 MG tablet Take 10 mg by mouth daily.    [provider]  chlorproMAZINE (THORAZINE) 200 MG tablet Take 200 mg by mouth at bedtime.     [provider]  clotrimazole-betamethasone (LOTRISONE) cream Apply 1 application topically 3 (three) times daily.    [provider]  divalproex (DEPAKOTE ER) 500 MG 24 hr tablet Take 500-1,000 mg by mouth 2 (two) times daily. Take 500 mg oral by mouth in the morning and take 1000 mg by mouth at bedtime.    [provider]  docusate sodium (COLACE) 100 MG capsule Take 100 mg by mouth daily.    [provider]  EPINEPHrine 0.3 mg/0.3 mL IJ SOAJ injection Inject 0.015 mg into the muscle as needed.    [provider]  escitalopram (LEXAPRO) 20 MG tablet Take 20 mg by mouth daily.    [provider]  gemfibrozil (LOPID) 600 MG tablet Take 600 mg by mouth 2 (two) times daily before a meal.    [provider]  haloperidol (HALDOL) 10 MG tablet Take 10 mg by mouth 2 (two) times daily as needed.     [provider]  ketoconazole (NIZORAL) 2 % cream Apply 1  application topically 2 (two) times daily.    [provider]  lidocaine (LIDODERM) 5 % Place 1 patch onto the skin every 12 (twelve) hours. Remove & Discard patch within 12 hours or as directed by MD.  Wynelle Fanny the patch off for 12 hours before applying a new one. Patient not taking: Reported on 11/08/2016 09/21/16 09/21/17  Loleta Rose, MD  lipase/protease/amylase (CREON) 36000 UNITS CPEP capsule Take 36,000-72,000 Units by mouth See admin instructions. tk 2 capsules with meals, and 1 with snacks.    [provider]  LORazepam (ATIVAN) 1 MG tablet Take 1 mg by mouth every 8 (eight)  hours as needed for anxiety.    [provider]  magnesium gluconate (MAGONATE) 500 MG tablet Take 500 mg by mouth daily.    [provider]  nystatin cream (MYCOSTATIN) Apply 1 application topically 2 (two) times daily as needed for dry skin.     [provider]  ondansetron (ZOFRAN) 8 MG tablet Take 8 mg by mouth every 8 (eight) hours as needed.     [provider]  Paliperidone Palmitate (INVEGA TRINZA) 819 MG/2.625ML SUSP Inject 1 Dose into the muscle.    [provider]  pantoprazole (PROTONIX) 40 MG tablet Take 40 mg by mouth 2 (two) times daily.     [provider]  promethazine (PHENERGAN) 25 MG tablet Take 25 mg by mouth every 6 (six) hours as needed for nausea or vomiting.    [provider]  propranolol (INDERAL) 20 MG tablet Take 20 mg by mouth every 8 (eight) hours as needed (TREMORS).     [provider]  propranolol (INNOPRAN XL) 80 MG 24 hr capsule Take 80 mg by mouth daily.    [provider]  QUEtiapine (SEROQUEL XR) 400 MG 24 hr tablet Take 800 mg by mouth at bedtime.    [provider]  QUEtiapine (SEROQUEL) 200 MG tablet Take 200 mg by mouth at bedtime.    [provider]  risperidone (RISPERDAL) 4 MG tablet Take 2 mg by mouth 2 (two) times daily as needed.     [provider]  SUMAtriptan (IMITREX) 100 MG tablet Take 100 mg by mouth every 2 (two) hours as needed for migraine. May repeat in 2 hours if headache persists or recurs.    [provider]  tiotropium (SPIRIVA) 18 MCG inhalation capsule Place 18 mcg into inhaler and inhale daily.    [provider]    Allergies Bee venom  No family history on file.  Social History Social History  Substance Use Topics  . Smoking status: Current Every Day Smoker    Packs/day: 1.00    Types: Cigarettes  . Smokeless tobacco: Never Used  . Alcohol use No    Review of Systems Constitutional: No  fever/chills Eyes: No visual changes. ENT: No sore throat. No stiff neck no neck pain Cardiovascular: Denies chest pain. Respiratory: Denies shortness of breath. Gastrointestinal:   no vomiting.  No diarrhea.  No constipation. Genitourinary: Negative for dysuria. Musculoskeletal: Negative lower extremity swelling Skin: Negative for rash. Neurological: Negative for severe headaches, focal weakness or numbness.   ____________________________________________   PHYSICAL EXAM:  VITAL SIGNS: ED Triage Vitals  Enc Vitals Group     BP 12/04/16 1929 128/89     Pulse Rate 12/04/16 1929 98     Resp 12/04/16 1929 20     Temp 12/04/16 1929 99.2 F (37.3 C)     Temp Source 12/04/16 1929  Oral     SpO2 12/04/16 1929 99 %     Weight 12/04/16 1924 225 lb (102.1 kg)     Height 12/04/16 1924 5\' 11"  (1.803 m)     Head Circumference --      Peak Flow --      Pain Score 12/04/16 1923 2     Pain Loc --      Pain Edu? --      Excl. in GC? --     Constitutional: Alert and orientedTo name and place unsure of the date somnolent but easily arousable.  in no acute distress. Eyes: Conjunctivae are normal Head: Atraumatic HEENT: No congestion/rhinnorhea. Mucous membranes are moist.  Oropharynx non-erythematous Neck:   Nontender with no meningismus, no masses, no stridor Cardiovascular: Normal rate, regular rhythm. Grossly normal heart sounds.  Good peripheral circulation. Respiratory: Normal respiratory effort.  No retractions. Lungs CTAB. Abdominal: Soft and nontender. No distention. No guarding no rebound Back:  There is no focal tenderness or step off.  there is no midline tenderness there are no lesions noted. there is no CVA tenderness Musculoskeletal: No lower extremity tenderness, no upper extremity tenderness. No joint effusions, no DVT signs strong distal pulses no edema Neurologic:  Normal speech and language. No gross focal neurologic deficits are appreciated.  Skin:  Skin is warm, dry and  intact. There is a laceration noted to the patient's elbow, is approximately 2.5 cm, appears to be somewhat old can't tell how long it has been there Psychiatric: Mood and affect are normal. Speech and behavior are normal.  ____________________________________________   LABS (all labs ordered are listed, but only abnormal results are displayed)  Labs Reviewed  GLUCOSE, CAPILLARY - Abnormal; Notable for the following:       Result Value   Glucose-Capillary 111 (*)    All other components within normal limits  ACETAMINOPHEN LEVEL - Abnormal; Notable for the following:    Acetaminophen (Tylenol), Serum <10 (*)    All other components within normal limits  COMPREHENSIVE METABOLIC PANEL - Abnormal; Notable for the following:    Glucose, Bld 104 (*)    ALT 12 (*)    All other components within normal limits  TROPONIN I  ETHANOL  CBC WITH DIFFERENTIAL/PLATELET  SALICYLATE LEVEL  URINALYSIS, COMPLETE (UACMP) WITH MICROSCOPIC  URINE DRUG SCREEN, QUALITATIVE ([facility] ONLY)  VALPROIC ACID LEVEL   ____________________________________________  EKG  I personally interpreted any EKGs ordered by me or triage Normal sinus rhythm, rate 75 bpm no ST elevation no ST depression, normal axis unremarkable EKG ____________________________________________  RADIOLOGY  I reviewed any imaging ordered by me or triage that were performed during my shift and, if possible, patient and/or family made aware of any abnormal findings. ____________________________________________   PROCEDURES  Procedure(s) performed: None  Procedures  Critical Care performed: None  ____________________________________________   INITIAL IMPRESSION / ASSESSMENT AND PLAN / ED COURSE  Pertinent labs & imaging results that were available during my care of the patient were reviewed by me and considered in my medical decision making (see chart for details).  Patient here with some degree of altered mental status history  of seizure disorder questionable seizures a week ago my concern is the patient's been having seizures no tongue bite, no obvious focality did not appear to wet his pants. I do feel however the patient would benefit from admission for further evaluation of possible recurrent seizures. He is awake, he is in no acute distress. I have talked to the  hospitalist and they agree. He does have a laceration to his elbow but it is absolutely unclear how long his been there and he is certainly subject a possible infection if he has had this for some time. We will ask him again when he wakes up better how long it is been there. Repeat CT scan is reassuring blood work is reassuring. Her level was pending. He is not on Depakote because of antiseizure medication, however rather as a mood stabilizer. Hospice would prefer not to load him with antiseizure medication given the uncertainty around the patient's diagnosis. He is in no acute distress at this time and we will admit him for observation. We will discern tetanus shot because I do not know and neither does he when his last tetanus was. Wound cleaned and dressed, does appear to be already healing, does not appear to go into the joint.    ____________________________________________   FINAL CLINICAL IMPRESSION(S) / ED DIAGNOSES  Final diagnoses:  None      This chart was dictated using voice recognition software.  Despite best efforts to proofread,  errors can occur which can change meaning.      Jeanmarie Plant, MD 12/04/16 2339    Jeanmarie Plant, MD 12/04/16 1610    Jeanmarie Plant, MD 12/04/16 2350

## 2016-12-04 NOTE — ED Notes (Signed)
Pt denies nausea, had spontaneous episode of vomiting, given ODT zofran. Cleaned and bed changed. Pt unsteady on feet, fall precautions implemented, encouraged to use call bell and not to attempt to get up. Pt states does not use assistive device to walk and this weakness is abnormal. Denies med changes, states doesn't feel any different than normal. Pt mentation delayed, story unclear.

## 2016-12-04 NOTE — ED Notes (Addendum)
Pt suddenly diaphoretic, clammy, increasingly weak as PA completed lac care. Only complaint of being hot and just "not feeling good". Pt moved to main ED d/t status. Blood work and IV access established prior to transport by [employer], [employer] fund[employer].   Elbow lac cleaned, closed, dressed by PA prior to transport.
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
## 2016-12-04 NOTE — ED Notes (Signed)
Report to Kasey, RN  

## 2016-12-04 NOTE — ED Notes (Signed)
Patient transported to CT 

## 2016-12-04 NOTE — ED Notes (Signed)
Pt seems more alert at this time, pt alert to voice and able to answer questions. Pt notified urine sample is needed and verbalized understanding of this.

## 2016-12-04 NOTE — H&P (Signed)
[facility] - [place] at [facility]   PATIENT NAME: Russell Buchanan    MR#:  [id number]  DATE OF BIRTH:  [birth year]  DATE OF ADMISSION:  12/04/2016  PRIMARY CARE PHYSICIAN: Armando Gang, FNP   REQUESTING/REFERRING PHYSICIAN: Alphonzo Lemmings, MD  CHIEF COMPLAINT:   Chief Complaint  Patient presents with  . Fall  . Extremity Laceration    HISTORY OF PRESENT ILLNESS:  Russell Buchanan  is a 41 y.o. male who presents with Falls. Patient states is been occurring over the past 2 weeks. He states that he has recently had his cervical dose adjusted, along with some other medicines, by his psychiatrist. He feels that these medications may be making him feel sleepy all the time. He was recently seen here in the ED for something similar, at that time there is a report somewhat to witnesses fall and hip possible likely having some jerking movements. He did have a history of seizures as a child. Hospitalists were called for admission  PAST MEDICAL HISTORY:   Past Medical History:  Diagnosis Date  . Antisocial personality disorder   . GERD (gastroesophageal reflux disease)   . Hyperlipidemia   . Schizophrenia (HCC)     PAST SURGICAL HISTORY:   Past Surgical History:  Procedure Laterality Date  . ORIF ELBOW FRACTURE Right   . WRIST RECONSTRUCTION Left     SOCIAL HISTORY:   Social History  Substance Use Topics  . Smoking status: Current Every Day Smoker    Packs/day: 1.00    Types: Cigarettes  . Smokeless tobacco: Never Used  . Alcohol use No    FAMILY HISTORY:   Family History  Problem Relation Age of Onset  . Family history unknown: Yes    DRUG ALLERGIES:   Allergies  Allergen Reactions  . Bee Venom Hives    MEDICATIONS AT HOME:   Prior to Admission medications   Medication Sig Start Date End Date Taking? Authorizing Provider  albuterol (PROVENTIL HFA;VENTOLIN HFA) 108 (90 Base) MCG/ACT inhaler Inhale 1-2 puffs into the lungs every 4 (four)  hours as needed for wheezing or shortness of breath.    [provider]  benztropine (COGENTIN) 1 MG tablet Take 1 mg by mouth 2 (two) times daily as needed.     [provider]  bismuth subsalicylate (PEPTO BISMOL) 262 MG/15ML suspension Take 30 mLs by mouth every 6 (six) hours as needed.    [provider]  buPROPion (WELLBUTRIN XL) 150 MG 24 hr tablet Take 150 mg by mouth daily.    [provider]  Butalbital-APAP-Caffeine 50-325-40 MG per capsule Take 1 capsule by mouth 2 (two) times daily as needed.     [provider]  cetirizine (ZYRTEC) 10 MG tablet Take 10 mg by mouth daily.    [provider]  chlorproMAZINE (THORAZINE) 200 MG tablet Take 200 mg by mouth at bedtime.     [provider]  clotrimazole-betamethasone (LOTRISONE) cream Apply 1 application topically 3 (three) times daily.    [provider]  divalproex (DEPAKOTE ER) 500 MG 24 hr tablet Take 500-1,000 mg by mouth 2 (two) times daily. Take 500 mg oral by mouth in the morning and take 1000 mg by mouth at bedtime.    [provider]  docusate sodium (COLACE) 100 MG capsule Take 100 mg by mouth daily.    [provider]  EPINEPHrine 0.3 mg/0.3 mL IJ SOAJ injection Inject 0.015 mg into the muscle as needed.  [provider]  escitalopram (LEXAPRO) 20 MG tablet Take 20 mg by mouth daily.    [provider]  gemfibrozil (LOPID) 600 MG tablet Take 600 mg by mouth 2 (two) times daily before a meal.    [provider]  haloperidol (HALDOL) 10 MG tablet Take 10 mg by mouth 2 (two) times daily as needed.     [provider]  ketoconazole (NIZORAL) 2 % cream Apply 1 application topically 2 (two) times daily.    [provider]  lidocaine (LIDODERM) 5 % Place 1 patch onto the skin every 12 (twelve) hours. Remove & Discard patch within 12 hours or as directed by MD.  Wynelle Fanny the patch off for 12 hours before  applying a new one. Patient not taking: Reported on 11/08/2016 09/21/16 09/21/17  Loleta Rose, MD  lipase/protease/amylase (CREON) 36000 UNITS CPEP capsule Take 36,000-72,000 Units by mouth See admin instructions. tk 2 capsules with meals, and 1 with snacks.    [provider]  LORazepam (ATIVAN) 1 MG tablet Take 1 mg by mouth every 8 (eight) hours as needed for anxiety.    [provider]  magnesium gluconate (MAGONATE) 500 MG tablet Take 500 mg by mouth daily.    [provider]  nystatin cream (MYCOSTATIN) Apply 1 application topically 2 (two) times daily as needed for dry skin.     [provider]  ondansetron (ZOFRAN) 8 MG tablet Take 8 mg by mouth every 8 (eight) hours as needed.     [provider]  Paliperidone Palmitate (INVEGA TRINZA) 819 MG/2.625ML SUSP Inject 1 Dose into the muscle.    [provider]  pantoprazole (PROTONIX) 40 MG tablet Take 40 mg by mouth 2 (two) times daily.     [provider]  promethazine (PHENERGAN) 25 MG tablet Take 25 mg by mouth every 6 (six) hours as needed for nausea or vomiting.    [provider]  propranolol (INDERAL) 20 MG tablet Take 20 mg by mouth every 8 (eight) hours as needed (TREMORS).     [provider]  propranolol (INNOPRAN XL) 80 MG 24 hr capsule Take 80 mg by mouth daily.    [provider]  QUEtiapine (SEROQUEL XR) 400 MG 24 hr tablet Take 800 mg by mouth at bedtime.    [provider]  QUEtiapine (SEROQUEL) 200 MG tablet Take 200 mg by mouth at bedtime.    [provider]  risperidone (RISPERDAL) 4 MG tablet Take 2 mg by mouth 2 (two) times daily as needed.     [provider]  SUMAtriptan (IMITREX) 100 MG tablet Take 100 mg by mouth every 2 (two) hours as needed for migraine. May repeat in 2 hours if headache persists or recurs.    [provider]  tiotropium (SPIRIVA) 18 MCG inhalation capsule Place 18 mcg into  inhaler and inhale daily.    [provider]    REVIEW OF SYSTEMS:  Review of Systems  Constitutional: Negative for chills, fever, malaise/fatigue and weight loss.  HENT: Negative for ear pain, hearing loss and tinnitus.   Eyes: Negative for blurred vision, double vision, pain and redness.  Respiratory: Negative for cough, hemoptysis and shortness of breath.   Cardiovascular: Negative for chest pain, palpitations, orthopnea and leg swelling.  Gastrointestinal: Negative for abdominal pain, constipation, diarrhea, nausea and vomiting.  Genitourinary: Negative for dysuria, frequency and hematuria.  Musculoskeletal: Positive for falls. Negative for back pain, joint pain and neck pain.  Skin:  No acne, rash, or lesions  Neurological: Negative for dizziness, tremors, focal weakness and weakness.  Endo/Heme/Allergies: Negative for polydipsia. Does not bruise/bleed easily.  Psychiatric/Behavioral: Negative for depression. The patient is not nervous/anxious and does not have insomnia.      VITAL SIGNS:   Vitals:   12/04/16 2201 12/04/16 2230 12/04/16 2300 12/04/16 2315  BP:  (!) 132/92 123/81   Pulse: 76 75 73 78  Resp: 13 12 12 15   Temp:      TempSrc:      SpO2: 98% 98% 97% 98%  Weight:      Height:       Wt Readings from Last 3 Encounters:  12/04/16 102.1 kg (225 lb)  11/26/16 102.1 kg (225 lb)  11/14/16 102.4 kg (225 lb 12.8 oz)    PHYSICAL EXAMINATION:  Physical Exam  Vitals reviewed. Constitutional: He is oriented to person, place, and time. He appears well-developed and well-nourished. No distress.  HENT:  Head: Normocephalic and atraumatic.  Mouth/Throat: Oropharynx is clear and moist.  Eyes: Conjunctivae and EOM are normal. Pupils are equal, round, and reactive to light. No scleral icterus.  Neck: Normal range of motion. Neck supple. No JVD present. No thyromegaly present.  Cardiovascular: Normal rate, regular rhythm and intact distal pulses.  Exam reveals  no gallop and no friction rub.   No murmur heard. Respiratory: Effort normal and breath sounds normal. No respiratory distress. He has no wheezes. He has no rales.  GI: Soft. Bowel sounds are normal. He exhibits no distension. There is no tenderness.  Musculoskeletal: Normal range of motion. He exhibits no edema.  No arthritis, no gout  Lymphadenopathy:    He has no cervical adenopathy.  Neurological: He is alert and oriented to person, place, and time. No cranial nerve deficit.  No dysarthria, no aphasia  Skin: Skin is warm and dry. No rash noted. No erythema.  Psychiatric: He has a normal mood and affect. His behavior is normal. Judgment and thought content normal.    LABORATORY PANEL:   CBC  Recent Labs Lab 12/04/16 2107  WBC 7.9  HGB 15.3  HCT 44.0  PLT 280   ------------------------------------------------------------------------------------------------------------------  Chemistries   Recent Labs Lab 12/04/16 2107  NA 140  K 4.0  CL 105  CO2 25  GLUCOSE 104*  BUN 9  CREATININE 1.01  CALCIUM 9.3  AST 22  ALT 12*  ALKPHOS 85  BILITOT 0.5   ------------------------------------------------------------------------------------------------------------------  Cardiac Enzymes  Recent Labs Lab 12/04/16 2107  TROPONINI <0.03   ------------------------------------------------------------------------------------------------------------------  RADIOLOGY:  Dg Elbow Complete Right  Result Date: 12/04/2016 CLINICAL DATA:  Fall, elbow pain EXAM: RIGHT ELBOW - COMPLETE 3+ VIEW COMPARISON:  None. FINDINGS: Plate and screw fixation device noted in the proximal ulna related to remote injury. No acute fracture, subluxation or dislocation. No joint effusion. IMPRESSION: No acute bony abnormality. Electronically Signed   By: Charlett Nose M.D.   On: 12/04/2016 20:27   Ct Head Wo Contrast  Result Date: 12/04/2016 CLINICAL DATA:  Recurrent falls over the past few days. EXAM:  CT HEAD WITHOUT CONTRAST CT CERVICAL SPINE WITHOUT CONTRAST TECHNIQUE: Multidetector CT imaging of the head and cervical spine was performed following the standard protocol without intravenous contrast. Multiplanar CT image reconstructions of the cervical spine were also generated. COMPARISON:  Head CT scan 11/26/2016.  Brain MRI 06/01/2016. FINDINGS: CT HEAD FINDINGS Brain: No hemorrhage, infarct, mass lesion, mass effect, midline shift or abnormal extra-axial fluid collection. No hydrocephalus or pneumocephalus. Vascular:  Negative. Skull: Intact. Sinuses/Orbits: Mucous retention cyst or polyp right maxillary sinus is incompletely visualized. Other: None. CT CERVICAL SPINE FINDINGS Alignment: Normal. Skull base and vertebrae: No acute fracture. No primary bone lesion or focal pathologic process. Soft tissues and spinal canal: No prevertebral fluid or swelling. No visible canal hematoma. Disc levels: Intervertebral disc space height is maintained at all levels. Upper chest: Clear. Other: None. IMPRESSION: No acute abnormality head or cervical spine. Mucous retention cyst or polyp right maxillary sinus noted. Electronically Signed   By: Drusilla Kanner M.D.   On: 12/04/2016 21:42   Ct Cervical Spine Wo Contrast  Result Date: 12/04/2016 CLINICAL DATA:  Recurrent falls over the past few days. EXAM: CT HEAD WITHOUT CONTRAST CT CERVICAL SPINE WITHOUT CONTRAST TECHNIQUE: Multidetector CT imaging of the head and cervical spine was performed following the standard protocol without intravenous contrast. Multiplanar CT image reconstructions of the cervical spine were also generated. COMPARISON:  Head CT scan 11/26/2016.  Brain MRI 06/01/2016. FINDINGS: CT HEAD FINDINGS Brain: No hemorrhage, infarct, mass lesion, mass effect, midline shift or abnormal extra-axial fluid collection. No hydrocephalus or pneumocephalus. Vascular: Negative. Skull: Intact. Sinuses/Orbits: Mucous retention cyst or polyp right maxillary sinus is  incompletely visualized. Other: None. CT CERVICAL SPINE FINDINGS Alignment: Normal. Skull base and vertebrae: No acute fracture. No primary bone lesion or focal pathologic process. Soft tissues and spinal canal: No prevertebral fluid or swelling. No visible canal hematoma. Disc levels: Intervertebral disc space height is maintained at all levels. Upper chest: Clear. Other: None. IMPRESSION: No acute abnormality head or cervical spine. Mucous retention cyst or polyp right maxillary sinus noted. Electronically Signed   By: Drusilla Kanner M.D.   On: 12/04/2016 21:42    EKG:   Orders placed or performed during the hospital encounter of 12/04/16  . EKG 12-Lead  . EKG 12-Lead  . ED EKG  . ED EKG    IMPRESSION AND PLAN:  Principal Problem:   Acute encephalopathy - unclear etiology, though his medication doses may have something to do with it. The patient is relatively familiar with what medicines he takes, though the medicines listed on our MAR not coincide clearly what he states. Some history regarding his falls indicate possible seizure activity. We will get an EEG, neurology consult, and a psychiatry consult in order to evaluate for possible seizures, but also medication adjustments. Active Problems:   Schizophrenia (HCC) - continue home meds that patient is very clear that he takes every day, psychiatry consult as above for help in adjusting his other medications   GERD (gastroesophageal reflux disease) - home dose PPI   HLD (hyperlipidemia) - continue home meds  All the records are reviewed and case discussed with ED provider. Management plans discussed with the patient and/or family.  DVT PROPHYLAXIS: SubQ lovenox  GI PROPHYLAXIS: PPI  ADMISSION STATUS: Inpatient  CODE STATUS: Full Code Status History    This patient does not have a recorded code status. Please follow your organizational policy for patients in this situation.      TOTAL TIME TAKING CARE OF THIS PATIENT: 45  minutes.   Balbina Depace FIELDING 12/04/2016, 11:58 PM  [facility]  Office  [phone]  CC: Primary care physician; Armando Gang, FNP  Note:  This document was prepared using Dragon voice recognition software and may include unintentional dictation errors.

## 2016-12-04 NOTE — ED Notes (Signed)
ED Provider at bedside. 

## 2016-12-05 ENCOUNTER — Inpatient Hospital Stay
Admit: 2016-12-05 | Discharge: 2016-12-05 | Disposition: A | Discharge status: Home / Self Care | Payer: Medicaid Other | Attending: Internal Medicine | Admitting: Internal Medicine

## 2016-12-05 ENCOUNTER — Inpatient Hospital Stay: Payer: Medicaid Other

## 2016-12-05 DIAGNOSIS — F203 Undifferentiated schizophrenia: Secondary | ICD-10-CM

## 2016-12-05 DIAGNOSIS — G9340 Encephalopathy, unspecified: Secondary | ICD-10-CM

## 2016-12-05 LAB — URINE DRUG SCREEN, QUALITATIVE (ARMC ONLY)
Amphetamines, Ur Screen: NOT DETECTED
BENZODIAZEPINE, UR SCRN: NOT DETECTED
Barbiturates, Ur Screen: NOT DETECTED
Cannabinoid 50 Ng, Ur ~~LOC~~: NOT DETECTED
Cocaine Metabolite,Ur ~~LOC~~: NOT DETECTED
MDMA (Ecstasy)Ur Screen: NOT DETECTED
METHADONE SCREEN, URINE: NOT DETECTED
Opiate, Ur Screen: NOT DETECTED
Phencyclidine (PCP) Ur S: NOT DETECTED
TRICYCLIC, UR SCREEN: POSITIVE — AB

## 2016-12-05 LAB — URINALYSIS, COMPLETE (UACMP) WITH MICROSCOPIC
BACTERIA UA: NONE SEEN
Bilirubin Urine: NEGATIVE
Glucose, UA: NEGATIVE mg/dL
Hgb urine dipstick: NEGATIVE
KETONES UR: 5 mg/dL — AB
LEUKOCYTES UA: NEGATIVE
NITRITE: NEGATIVE
PROTEIN: NEGATIVE mg/dL
Specific Gravity, Urine: 1.012 (ref 1.005–1.030)
Squamous Epithelial / LPF: NONE SEEN
pH: 7 (ref 5.0–8.0)

## 2016-12-05 LAB — BASIC METABOLIC PANEL
ANION GAP: 9 (ref 5–15)
BUN: 10 mg/dL (ref 6–20)
CHLORIDE: 106 mmol/L (ref 101–111)
CO2: 27 mmol/L (ref 22–32)
Calcium: 9.2 mg/dL (ref 8.9–10.3)
Creatinine, Ser: 0.98 mg/dL (ref 0.61–1.24)
GFR calc Af Amer: >60 mL/min (ref >60)
GFR calc non Af Amer: >60 mL/min (ref >60)
GLUCOSE: 103 mg/dL — AB (ref 65–99)
POTASSIUM: 3.9 mmol/L (ref 3.5–5.1)
Sodium: 142 mmol/L (ref 135–145)

## 2016-12-05 LAB — ECHOCARDIOGRAM COMPLETE
Height: 71 in
Weight: 3600 oz

## 2016-12-05 LAB — CBC
HEMATOCRIT: 41.5 % (ref 40.0–52.0)
HEMOGLOBIN: 14.5 g/dL (ref 13.0–18.0)
MCH: 30.8 pg (ref 26.0–34.0)
MCHC: 34.9 g/dL (ref 32.0–36.0)
MCV: 88.3 fL (ref 80.0–100.0)
Platelets: 245 10*3/uL (ref 150–440)
RBC: 4.7 MIL/uL (ref 4.40–5.90)
RDW: 14.2 % (ref 11.5–14.5)
WBC: 6.8 10*3/uL (ref 3.8–10.6)

## 2016-12-05 LAB — MRSA PCR SCREENING: MRSA by PCR: NEGATIVE

## 2016-12-05 LAB — HIV ANTIBODY (ROUTINE TESTING W REFLEX): HIV Screen 4th Generation wRfx: NONREACTIVE

## 2016-12-05 MED ORDER — PANCRELIPASE (LIP-PROT-AMYL) 12000-38000 UNITS PO CPEP: 24000.0000 [IU] | ORAL_CAPSULE | Freq: Three times a day (TID) | ORAL | Status: DC

## 2016-12-05 MED ORDER — ACETAMINOPHEN 650 MG RE SUPP: 650.0000 mg | Freq: Four times a day (QID) | RECTAL | Status: DC | PRN

## 2016-12-05 MED ORDER — BUPROPION HCL ER (XL) 150 MG PO TB24
150.0000 mg | ORAL_TABLET | Freq: Every day | ORAL | Status: DC
Start: 2016-12-05 — End: 2016-12-06
  Administered 2016-12-05 – 2016-12-06 (×2): 150 mg via ORAL
  Filled 2016-12-05 (×2): qty 1

## 2016-12-05 MED ORDER — BISMUTH SUBSALICYLATE 262 MG/15ML PO SUSP
30.0000 mL | Freq: Four times a day (QID) | ORAL | Status: DC | PRN
Start: 2016-12-05 — End: 2016-12-06
  Filled 2016-12-05: qty 118

## 2016-12-05 MED ORDER — DIVALPROEX SODIUM ER 500 MG PO TB24
500.0000 mg | ORAL_TABLET | Freq: Every morning | ORAL | Status: DC
  Administered 2016-12-05 – 2016-12-06 (×2): 500 mg via ORAL
  Filled 2016-12-05 (×2): qty 1

## 2016-12-05 MED ORDER — PANCRELIPASE (LIP-PROT-AMYL) 12000-38000 UNITS PO CPEP: 36000.0000 [IU] | ORAL_CAPSULE | ORAL | Status: DC

## 2016-12-05 MED ORDER — PROCHLORPERAZINE EDISYLATE 5 MG/ML IJ SOLN
10.0000 mg | Freq: Four times a day (QID) | INTRAMUSCULAR | Status: DC | PRN
  Administered 2016-12-05 (×2): 10 mg via INTRAVENOUS
  Filled 2016-12-05 (×3): qty 2

## 2016-12-05 MED ORDER — ONDANSETRON HCL 4 MG PO TABS: 4.0000 mg | ORAL_TABLET | Freq: Four times a day (QID) | ORAL | Status: DC | PRN

## 2016-12-05 MED ORDER — DIVALPROEX SODIUM ER 500 MG PO TB24: 500.0000 mg | ORAL_TABLET | Freq: Two times a day (BID) | ORAL | Status: DC

## 2016-12-05 MED ORDER — PANTOPRAZOLE SODIUM 40 MG PO TBEC
40.0000 mg | DELAYED_RELEASE_TABLET | Freq: Two times a day (BID) | ORAL | Status: DC
  Administered 2016-12-05 – 2016-12-06 (×3): 40 mg via ORAL
  Filled 2016-12-05 (×3): qty 1

## 2016-12-05 MED ORDER — DIVALPROEX SODIUM ER 500 MG PO TB24
1000.0000 mg | ORAL_TABLET | Freq: Every day | ORAL | Status: DC
  Administered 2016-12-05: 1000 mg via ORAL
  Filled 2016-12-05 (×2): qty 2

## 2016-12-05 MED ORDER — ONDANSETRON HCL 4 MG/2ML IJ SOLN
INTRAMUSCULAR | Status: AC
  Administered 2016-12-05: 17:00:00 4 mg via INTRAVENOUS
  Filled 2016-12-05: qty 2

## 2016-12-05 MED ORDER — PNEUMOCOCCAL VAC POLYVALENT 25 MCG/0.5ML IJ INJ
0.5000 mL | INJECTION | INTRAMUSCULAR | Status: DC
  Filled 2016-12-05: qty 0.5

## 2016-12-05 MED ORDER — PANCRELIPASE (LIP-PROT-AMYL) 12000-38000 UNITS PO CPEP
36000.0000 [IU] | ORAL_CAPSULE | Freq: Three times a day (TID) | ORAL | Status: DC | PRN
  Administered 2016-12-06: 09:00:00 36000 [IU] via ORAL

## 2016-12-05 MED ORDER — PROPRANOLOL HCL ER BEADS 80 MG PO CP24: 80.0000 mg | ORAL_CAPSULE | Freq: Every day | ORAL | Status: DC

## 2016-12-05 MED ORDER — QUETIAPINE FUMARATE ER 200 MG PO TB24
800.0000 mg | ORAL_TABLET | Freq: Every day | ORAL | Status: DC
  Administered 2016-12-05: 800 mg via ORAL
  Filled 2016-12-05: qty 4
  Filled 2016-12-05: qty 2

## 2016-12-05 MED ORDER — LORAZEPAM 1 MG PO TABS: 1.0000 mg | ORAL_TABLET | Freq: Three times a day (TID) | ORAL | Status: DC | PRN

## 2016-12-05 MED ORDER — PROPRANOLOL HCL ER 80 MG PO CP24
80.0000 mg | ORAL_CAPSULE | Freq: Every day | ORAL | Status: DC
  Administered 2016-12-05 – 2016-12-06 (×2): 80 mg via ORAL
  Filled 2016-12-05 (×2): qty 1

## 2016-12-05 MED ORDER — ESCITALOPRAM OXALATE 20 MG PO TABS
20.0000 mg | ORAL_TABLET | Freq: Every day | ORAL | Status: DC
  Administered 2016-12-05 – 2016-12-06 (×2): 20 mg via ORAL
  Filled 2016-12-05 (×2): qty 1

## 2016-12-05 MED ORDER — TIOTROPIUM BROMIDE MONOHYDRATE 18 MCG IN CAPS
18.0000 ug | ORAL_CAPSULE | Freq: Every day | RESPIRATORY_TRACT | Status: DC
  Administered 2016-12-05 – 2016-12-06 (×2): 18 ug via RESPIRATORY_TRACT
  Filled 2016-12-05: qty 5

## 2016-12-05 MED ORDER — ACETAMINOPHEN 325 MG PO TABS: 650.0000 mg | ORAL_TABLET | Freq: Four times a day (QID) | ORAL | Status: DC | PRN

## 2016-12-05 MED ORDER — ENOXAPARIN SODIUM 40 MG/0.4ML ~~LOC~~ SOLN
40.0000 mg | SUBCUTANEOUS | Status: DC
  Administered 2016-12-05: 40 mg via SUBCUTANEOUS
  Filled 2016-12-05 (×2): qty 0.4

## 2016-12-05 MED ORDER — PANCRELIPASE (LIP-PROT-AMYL) 12000-38000 UNITS PO CPEP
72000.0000 [IU] | ORAL_CAPSULE | Freq: Three times a day (TID) | ORAL | Status: DC
  Administered 2016-12-05 – 2016-12-06 (×4): 72000 [IU] via ORAL
  Filled 2016-12-05 (×4): qty 6

## 2016-12-05 MED ORDER — PANCRELIPASE (LIP-PROT-AMYL) 12000-38000 UNITS PO CPEP: 12000.0000 [IU] | ORAL_CAPSULE | ORAL | Status: DC

## 2016-12-05 MED ORDER — ONDANSETRON HCL 4 MG/2ML IJ SOLN
4.0000 mg | Freq: Four times a day (QID) | INTRAMUSCULAR | Status: DC | PRN
  Administered 2016-12-05 – 2016-12-06 (×4): 4 mg via INTRAVENOUS
  Filled 2016-12-05 (×3): qty 2

## 2016-12-05 NOTE — NC FL2 (Addendum)
[place] MEDICAID FL2 LEVEL OF CARE SCREENING TOOL     IDENTIFICATION  Patient Name: Russell Buchanan Birthdate: 24-Nov-1975 Sex: male Admission Date (Current Location): 12/04/2016  [place] and [place] Number:  [name] [id number] [facility] Facility and Address:  [facility], [address], [place], [place] [address]      Provider Number: [id number]  Attending Physician Name and Address:  Houston Siren, MD  Relative Name and Phone Number:  [name] [phone]     Current Level of Care: Hospital Recommended Level of Care: Other (Comment) (Group Home) Prior Approval Number:    Date Approved/Denied:   PASRR Number:    Discharge Plan: Other (Comment) (Group Home)    Current Diagnoses: Patient Active Problem List   Diagnosis Date Noted  . Acute encephalopathy 12/04/2016  . GERD (gastroesophageal reflux disease) 12/04/2016  . HLD (hyperlipidemia) 12/04/2016  . Schizophrenia (HCC) 11/15/2016  . Medication side effects 11/15/2016    Orientation RESPIRATION BLADDER Height & Weight     Self, Time, Situation, Place  Normal Continent Weight: 225 lb (102.1 kg) Height:  5\' 11"  (180.3 cm)  BEHAVIORAL SYMPTOMS/MOOD NEUROLOGICAL BOWEL NUTRITION STATUS      Continent Diet (Heart Healthy)  AMBULATORY STATUS COMMUNICATION OF NEEDS Skin   Supervision Verbally Normal                       Personal Care Assistance Level of Assistance  Dressing, Feeding, Bathing Bathing Assistance: Independent Feeding assistance: Independent Dressing Assistance: Independent     Functional Limitations Info  Sight, Hearing, Speech Sight Info: Adequate Hearing Info: Adequate Speech Info: Adequate    SPECIAL CARE FACTORS FREQUENCY                       Contractures Contractures Info: Not present    Additional Factors Info  Code Status, Allergies, Psychotropic Code Status Info: FULL Allergies Info: Bee Venom  Psychotropic Info: Wellbutrin XL; Depakote ER;  Lexapro; Seroquel XR        Discharge Medications: Please see discharge summary for a list of discharge medications. Medication List    STOP taking these medications   chlorproMAZINE 200 MG tablet Commonly known as:  THORAZINE     TAKE these medications   albuterol 108 (90 Base) MCG/ACT inhaler Commonly known as:  PROVENTIL HFA;VENTOLIN HFA Inhale 1-2 puffs into the lungs every 4 (four) hours as needed for wheezing or shortness of breath.   benztropine 1 MG tablet Commonly known as:  COGENTIN Take 1 mg by mouth 2 (two) times daily as needed for tremors.   buPROPion 150 MG 24 hr tablet Commonly known as:  WELLBUTRIN XL Take 150 mg by mouth daily.   Butalbital-APAP-Caffeine 50-325-40 MG capsule Take 1 capsule by mouth 2 (two) times daily as needed for headache.   cetirizine 10 MG tablet Commonly known as:  ZYRTEC Take 10 mg by mouth daily.   clotrimazole-betamethasone cream Commonly known as:  LOTRISONE Apply 1 application topically 3 (three) times daily.   CREON 36000 UNITS Cpep capsule Generic drug:  lipase/protease/amylase Take 36,000 Units by mouth 3 (three) times daily between meals as needed (with snacks).   lipase/protease/amylase 93267 UNITS Cpep capsule Commonly known as:  CREON Take 72,000 Units by mouth 3 (three) times daily with meals.   divalproex 500 MG 24 hr tablet Commonly known as:  DEPAKOTE ER Take 500-1,000 mg by mouth 2 (two) times daily. Take 500 mg oral by mouth in the  morning and take 1000 mg by mouth at bedtime.   docusate sodium 100 MG capsule Commonly known as:  COLACE Take 100 mg by mouth daily.   EPINEPHrine 0.3 mg/0.3 mL Soaj injection Commonly known as:  EPI-PEN Inject 0.3 mg into the muscle as needed (allergic reactions).   escitalopram 20 MG tablet Commonly known as:  LEXAPRO Take 20 mg by mouth daily.   gemfibrozil 600 MG tablet Commonly known as:  LOPID Take 600 mg by mouth 2 (two) times daily before a  meal.   haloperidol 10 MG tablet Commonly known as:  HALDOL Take 10 mg by mouth 2 (two) times daily as needed (agitation).   INVEGA TRINZA 819 MG/2.625ML Susp Generic drug:  Paliperidone Palmitate Inject 1 Dose into the muscle every 3 (three) months.   ketoconazole 2 % cream Commonly known as:  NIZORAL Apply 1 application topically 2 (two) times daily.   LORazepam 1 MG tablet Commonly known as:  ATIVAN Take 1 mg by mouth every 8 (eight) hours as needed for anxiety.   magnesium gluconate 500 MG tablet Commonly known as:  MAGONATE Take 500 mg by mouth daily.   nystatin cream Commonly known as:  MYCOSTATIN Apply 1 application topically 2 (two) times daily as needed for dry skin.   ondansetron 8 MG tablet Commonly known as:  ZOFRAN Take 8 mg by mouth every 8 (eight) hours as needed for nausea or vomiting.   pantoprazole 40 MG tablet Commonly known as:  PROTONIX Take 40 mg by mouth 2 (two) times daily.   propranolol 20 MG tablet Commonly known as:  INDERAL Take 20 mg by mouth every 8 (eight) hours as needed (TREMORS).   propranolol 80 MG 24 hr capsule Commonly known as:  INNOPRAN XL Take 80 mg by mouth daily.   QUEtiapine 400 MG 24 hr tablet Commonly known as:  SEROQUEL XR Take 800 mg by mouth at bedtime.   risperidone 4 MG tablet Commonly known as:  RISPERDAL Take 2 mg by mouth 2 (two) times daily as needed (severe agitation).   SUMAtriptan 100 MG tablet Commonly known as:  IMITREX Take 100 mg by mouth every 2 (two) hours as needed for migraine. May repeat in 2 hours if headache persists or recurs.   tiotropium 18 MCG inhalation capsule Commonly known as:  SPIRIVA Place 18 mcg into inhaler and inhale daily.    Relevant Imaging Results:  Relevant Lab Results:   Additional Information SS#: [id number]  Lew Dawes, LCSW

## 2016-12-05 NOTE — Progress Notes (Signed)
[facility] [place] [facility]   PATIENT NAME: Russell Buchanan    MR#:  [id number]  DATE OF BIRTH:  07-Apr-1976  SUBJECTIVE:   Patient here due to recurrent falls and significant tremors. No evidence of seizures overnight. No other acute infectious or metabolic abnormality noted. Awaiting neurology and psychiatric evaluations.  REVIEW OF SYSTEMS:    Review of Systems  Constitutional: Negative for chills and fever.  HENT: Negative for congestion and tinnitus.   Eyes: Negative for blurred vision and double vision.  Respiratory: Negative for cough, shortness of breath and wheezing.   Cardiovascular: Negative for chest pain, orthopnea and PND.  Gastrointestinal: Negative for abdominal pain, diarrhea, nausea and vomiting.  Genitourinary: Negative for dysuria and hematuria.  Neurological: Positive for tremors. Negative for dizziness, sensory change and focal weakness.  All other systems reviewed and are negative.   Nutrition: Heart Healthy Tolerating Diet: Yes Tolerating PT: Await Eval.   DRUG ALLERGIES:   Allergies  Allergen Reactions  . Bee Venom Hives    VITALS:  Blood pressure 102/77, pulse 87, temperature 97.9 F (36.6 C), temperature source Oral, resp. rate 18, height 5\' 11"  (1.803 m), weight 102.1 kg (225 lb), SpO2 97 %.  PHYSICAL EXAMINATION:   Physical Exam  GENERAL:  41 y.o.-year-old patient lying in bed in no acute distress.  EYES: Pupils equal, round, reactive to light and accommodation. No scleral icterus. Extraocular muscles intact.  HEENT: Head atraumatic, normocephalic. Oropharynx and nasopharynx clear.  NECK:  Supple, no jugular venous distention. No thyroid enlargement, no tenderness.  LUNGS: Normal breath sounds bilaterally, no wheezing, rales, rhonchi. No use of accessory muscles of respiration.  CARDIOVASCULAR: S1, S2 normal. No murmurs, rubs, or gallops.  ABDOMEN: Soft, nontender, nondistended. Bowel sounds present. No organomegaly  or mass.  EXTREMITIES: No cyanosis, clubbing or edema b/l.    NEUROLOGIC: Cranial nerves II through XII are intact. No focal Motor or sensory deficits b/l. + intentional Tremors noted.  PSYCHIATRIC: The patient is alert and oriented x 3.  SKIN: No obvious rash, lesion, or ulcer.    LABORATORY PANEL:   CBC  Recent Labs Lab 12/05/16 0534  WBC 6.8  HGB 14.5  HCT 41.5  PLT 245   ------------------------------------------------------------------------------------------------------------------  Chemistries   Recent Labs Lab 12/04/16 2107 12/05/16 0534  NA 140 142  K 4.0 3.9  CL 105 106  CO2 25 27  GLUCOSE 104* 103*  BUN 9 10  CREATININE 1.01 0.98  CALCIUM 9.3 9.2  AST 22  --   ALT 12*  --   ALKPHOS 85  --   BILITOT 0.5  --    ------------------------------------------------------------------------------------------------------------------  Cardiac Enzymes  Recent Labs Lab 12/04/16 2107  TROPONINI <0.03   ------------------------------------------------------------------------------------------------------------------  RADIOLOGY:  Dg Elbow Complete Right  Result Date: 12/04/2016 CLINICAL DATA:  Fall, elbow pain EXAM: RIGHT ELBOW - COMPLETE 3+ VIEW COMPARISON:  None. FINDINGS: Plate and screw fixation device noted in the proximal ulna related to remote injury. No acute fracture, subluxation or dislocation. No joint effusion. IMPRESSION: No acute bony abnormality. Electronically Signed   By: Charlett Nose M.D.   On: 12/04/2016 20:27   Ct Head Wo Contrast  Result Date: 12/04/2016 CLINICAL DATA:  Recurrent falls over the past few days. EXAM: CT HEAD WITHOUT CONTRAST CT CERVICAL SPINE WITHOUT CONTRAST TECHNIQUE: Multidetector CT imaging of the head and cervical spine was performed following the standard protocol without intravenous contrast. Multiplanar CT image reconstructions of the cervical spine were also generated.  COMPARISON:  Head CT scan 11/26/2016.  Brain MRI  06/01/2016. FINDINGS: CT HEAD FINDINGS Brain: No hemorrhage, infarct, mass lesion, mass effect, midline shift or abnormal extra-axial fluid collection. No hydrocephalus or pneumocephalus. Vascular: Negative. Skull: Intact. Sinuses/Orbits: Mucous retention cyst or polyp right maxillary sinus is incompletely visualized. Other: None. CT CERVICAL SPINE FINDINGS Alignment: Normal. Skull base and vertebrae: No acute fracture. No primary bone lesion or focal pathologic process. Soft tissues and spinal canal: No prevertebral fluid or swelling. No visible canal hematoma. Disc levels: Intervertebral disc space height is maintained at all levels. Upper chest: Clear. Other: None. IMPRESSION: No acute abnormality head or cervical spine. Mucous retention cyst or polyp right maxillary sinus noted. Electronically Signed   By: Drusilla Kanner M.D.   On: 12/04/2016 21:42   Ct Cervical Spine Wo Contrast  Result Date: 12/04/2016 CLINICAL DATA:  Recurrent falls over the past few days. EXAM: CT HEAD WITHOUT CONTRAST CT CERVICAL SPINE WITHOUT CONTRAST TECHNIQUE: Multidetector CT imaging of the head and cervical spine was performed following the standard protocol without intravenous contrast. Multiplanar CT image reconstructions of the cervical spine were also generated. COMPARISON:  Head CT scan 11/26/2016.  Brain MRI 06/01/2016. FINDINGS: CT HEAD FINDINGS Brain: No hemorrhage, infarct, mass lesion, mass effect, midline shift or abnormal extra-axial fluid collection. No hydrocephalus or pneumocephalus. Vascular: Negative. Skull: Intact. Sinuses/Orbits: Mucous retention cyst or polyp right maxillary sinus is incompletely visualized. Other: None. CT CERVICAL SPINE FINDINGS Alignment: Normal. Skull base and vertebrae: No acute fracture. No primary bone lesion or focal pathologic process. Soft tissues and spinal canal: No prevertebral fluid or swelling. No visible canal hematoma. Disc levels: Intervertebral disc space height is maintained  at all levels. Upper chest: Clear. Other: None. IMPRESSION: No acute abnormality head or cervical spine. Mucous retention cyst or polyp right maxillary sinus noted. Electronically Signed   By: Drusilla Kanner M.D.   On: 12/04/2016 21:42     ASSESSMENT AND PLAN:   41 year old male with past medical history of schizophrenia, Hyperlipidemia, GERD, hx of anti-social personality disorder who presents to the hospital due to tremors, increasing falls.  1. Acute encephalopathy-etiology unclear but suspected to be related to polypharmacy. -Mental status back to baseline. CT scan of the head and cervical spine negative for acute abnormality -Await neurology input, EEG to be done later today but patient has no evidence of acute seizures presently.  2. Recurrent falls/weakness-await physical therapy evaluation to assess mobility.  3. History of schizophrenia-continue Wellbutrin, Depakote, Lexapro, Seroquel. -Await further psychiatric input.  4. History of intentional tremor-continue propranolol.  5. History of chronic pancreatitis-continue Creon supplements.      All the records are reviewed and case discussed with Care Management/Social Worker. Management plans discussed with the patient, family and they are in agreement.  CODE STATUS: Full code  DVT Prophylaxis: Lovenox  TOTAL TIME TAKING CARE OF THIS PATIENT: 30 minutes.   POSSIBLE D/C IN 1-2 DAYS, DEPENDING ON CLINICAL CONDITION.   Houston Siren M.D on 12/05/2016 at 1:38 PM  Between 7am to 6pm - Pager - [phone]  After 6pm go to [email or web address] - Social research officer, government  [facility]  Office  [phone]  CC: Primary care physician; Armando Gang, FNP

## 2016-12-05 NOTE — Consult Note (Signed)
Reason for Consult:falls Referring Physician: Dr. Cherlynn Kaiser  CC: falls  HPI: Russell Buchanan is an 41 y.o. male presents with Falls. Patient states is been occurring over the past 2 weeks believes symptoms associated with change in his psychiatric medications.  Currently back to baseline but has essential tremor. Hx of seizures when he was a child.    Past Medical History:  Diagnosis Date  . Antisocial personality disorder   . GERD (gastroesophageal reflux disease)   . Hyperlipidemia   . Schizophrenia [facility])     Past Surgical History:  Procedure Laterality Date  . ORIF ELBOW FRACTURE Right   . WRIST RECONSTRUCTION Left     Family History  Problem Relation Age of Onset  . Family history unknown: Yes    Social History:  reports that he has been smoking Cigarettes.  He has been smoking about 1.00 pack per day. He has never used smokeless tobacco. He reports that he does not drink alcohol or use drugs.  Allergies  Allergen Reactions  . Bee Venom Hives    Medications: I have reviewed the patient's current medications.  ROS:  General ROS: negative for - chills, fatigue, fever, night sweats, weight gain or weight loss Psychological ROS: negative for - behavioral disorder, hallucinations, memory difficulties, mood swings or suicidal ideation Ophthalmic ROS: negative for - blurry vision, double vision, eye pain or loss of vision ENT ROS: negative for - epistaxis, nasal discharge, oral lesions, sore throat, tinnitus or vertigo Allergy and Immunology ROS: negative for - hives or itchy/watery eyes Hematological and Lymphatic ROS: negative for - bleeding problems, bruising or swollen lymph nodes Endocrine ROS: negative for - galactorrhea, hair pattern changes, polydipsia/polyuria or temperature intolerance Respiratory ROS: negative for - cough, hemoptysis, shortness of breath or wheezing Cardiovascular ROS: negative for - chest pain, dyspnea on exertion, edema or irregular  heartbeat Gastrointestinal ROS: negative for - abdominal pain, diarrhea, hematemesis, nausea/vomiting or stool incontinence Genito-Urinary ROS: negative for - dysuria, hematuria, incontinence or urinary frequency/urgency Musculoskeletal ROS: negative for - joint swelling or muscular weakness Neurological ROS: as noted in HPI Dermatological ROS: negative for rash and skin lesion changes  Physical Examination: Blood pressure 102/77, pulse 87, temperature 97.9 F (36.6 C), temperature source Oral, resp. rate 18, height 5\' 11"  (1.803 m), weight 102.1 kg (225 lb), SpO2 97 %. Neurological Examination   Mental Status: Alert, oriented, thought content appropriate.  Speech fluent without evidence of aphasia.  Able to follow 3 step commands without difficulty. Cranial Nerves: II: Discs flat bilaterally; Visual fields grossly normal, pupils equal, round, reactive to light and accommodation III,IV, VI: ptosis not present, extra-ocular motions intact bilaterally V,VII: smile symmetric, facial light touch sensation normal bilaterally VIII: hearing normal bilaterally IX,X: gag reflex present XI: bilateral shoulder shrug XII: midline tongue extension Motor: Right : Upper extremity   5/5    Left:     Upper extremity   5/5  Lower extremity   5/5     Lower extremity   5/5 Tone and bulk:normal tone throughout; no atrophy noted Sensory: Pinprick and light touch intact throughout, bilaterally Deep Tendon Reflexes: 2+ and symmetric throughout Plantars: Right: downgoing   Left: downgoing Cerebellar: normal finger-to-nose, normal rapid alternating movements and normal heel-to-shin test Gait: not tested. Pt has essential tremor     Laboratory Studies:   Basic Metabolic Panel:  Recent Labs Lab 12/04/16 2107 12/05/16 0534  NA 140 142  K 4.0 3.9  CL 105 106  CO2 25 27  GLUCOSE 104*  103*  BUN 9 10  CREATININE 1.01 0.98  CALCIUM 9.3 9.2    Liver Function Tests:  Recent Labs Lab 12/04/16 2107   AST 22  ALT 12*  ALKPHOS 85  BILITOT 0.5  PROT 6.6  ALBUMIN 3.6   No results for input(s): LIPASE, AMYLASE in the last 168 hours. No results for input(s): AMMONIA in the last 168 hours.  CBC:  Recent Labs Lab 12/04/16 2107 12/05/16 0534  WBC 7.9 6.8  NEUTROABS 3.9  --   HGB 15.3 14.5  HCT 44.0 41.5  MCV 87.7 88.3  PLT 280 245    Cardiac Enzymes:  Recent Labs Lab 12/04/16 2107  TROPONINI <0.03    BNP: Invalid input(s): POCBNP  CBG:  Recent Labs Lab 12/04/16 2056  GLUCAP 111*    Microbiology: Results for orders placed or performed during the hospital encounter of 12/04/16  MRSA PCR Screening     Status: None   Collection Time: 12/05/16  3:02 AM  Result Value Ref Range Status   MRSA by PCR NEGATIVE NEGATIVE Final    Comment:        The GeneXpert MRSA Assay (FDA approved for NASAL specimens only), is one component of a comprehensive MRSA colonization surveillance program. It is not intended to diagnose MRSA infection nor to guide or monitor treatment for MRSA infections.     Coagulation Studies: No results for input(s): LABPROT, INR in the last 72 hours.  Urinalysis:  Recent Labs Lab 12/05/16 0052  COLORURINE YELLOW*  LABSPEC 1.012  PHURINE 7.0  GLUCOSEU NEGATIVE  HGBUR NEGATIVE  BILIRUBINUR NEGATIVE  KETONESUR 5*  PROTEINUR NEGATIVE  NITRITE NEGATIVE  LEUKOCYTESUR NEGATIVE    Lipid Panel:     Component Value Date/Time   CHOL 192 03/27/2012 0613   TRIG 139 03/27/2012 0613   HDL 38 (L) 03/27/2012 0613   VLDL 28 03/27/2012 0613   LDLCALC 126 (H) 03/27/2012 0613    HgbA1C: No results found for: HGBA1C  Urine Drug Screen:     Component Value Date/Time   LABOPIA NONE DETECTED 12/05/2016 0052   COCAINSCRNUR NONE DETECTED 12/05/2016 0052   LABBENZ NONE DETECTED 12/05/2016 0052   AMPHETMU NONE DETECTED 12/05/2016 0052   THCU NONE DETECTED 12/05/2016 0052   LABBARB NONE DETECTED 12/05/2016 0052    Alcohol Level:  Recent  Labs Lab 12/04/16 2107  ETH <5    Other results: EKG: normal EKG, normal sinus rhythm, unchanged from previous tracings.  Imaging: Dg Elbow Complete Right  Result Date: 12/04/2016 CLINICAL DATA:  Fall, elbow pain EXAM: RIGHT ELBOW - COMPLETE 3+ VIEW COMPARISON:  None. FINDINGS: Plate and screw fixation device noted in the proximal ulna related to remote injury. No acute fracture, subluxation or dislocation. No joint effusion. IMPRESSION: No acute bony abnormality. Electronically Signed   By: Charlett Nose M.D.   On: 12/04/2016 20:27   Ct Head Wo Contrast  Result Date: 12/04/2016 CLINICAL DATA:  Recurrent falls over the past few days. EXAM: CT HEAD WITHOUT CONTRAST CT CERVICAL SPINE WITHOUT CONTRAST TECHNIQUE: Multidetector CT imaging of the head and cervical spine was performed following the standard protocol without intravenous contrast. Multiplanar CT image reconstructions of the cervical spine were also generated. COMPARISON:  Head CT scan 11/26/2016.  Brain MRI 06/01/2016. FINDINGS: CT HEAD FINDINGS Brain: No hemorrhage, infarct, mass lesion, mass effect, midline shift or abnormal extra-axial fluid collection. No hydrocephalus or pneumocephalus. Vascular: Negative. Skull: Intact. Sinuses/Orbits: Mucous retention cyst or polyp right maxillary sinus is incompletely visualized.  Other: None. CT CERVICAL SPINE FINDINGS Alignment: Normal. Skull base and vertebrae: No acute fracture. No primary bone lesion or focal pathologic process. Soft tissues and spinal canal: No prevertebral fluid or swelling. No visible canal hematoma. Disc levels: Intervertebral disc space height is maintained at all levels. Upper chest: Clear. Other: None. IMPRESSION: No acute abnormality head or cervical spine. Mucous retention cyst or polyp right maxillary sinus noted. Electronically Signed   By: Drusilla Kanner M.D.   On: 12/04/2016 21:42   Ct Cervical Spine Wo Contrast  Result Date: 12/04/2016 CLINICAL DATA:  Recurrent  falls over the past few days. EXAM: CT HEAD WITHOUT CONTRAST CT CERVICAL SPINE WITHOUT CONTRAST TECHNIQUE: Multidetector CT imaging of the head and cervical spine was performed following the standard protocol without intravenous contrast. Multiplanar CT image reconstructions of the cervical spine were also generated. COMPARISON:  Head CT scan 11/26/2016.  Brain MRI 06/01/2016. FINDINGS: CT HEAD FINDINGS Brain: No hemorrhage, infarct, mass lesion, mass effect, midline shift or abnormal extra-axial fluid collection. No hydrocephalus or pneumocephalus. Vascular: Negative. Skull: Intact. Sinuses/Orbits: Mucous retention cyst or polyp right maxillary sinus is incompletely visualized. Other: None. CT CERVICAL SPINE FINDINGS Alignment: Normal. Skull base and vertebrae: No acute fracture. No primary bone lesion or focal pathologic process. Soft tissues and spinal canal: No prevertebral fluid or swelling. No visible canal hematoma. Disc levels: Intervertebral disc space height is maintained at all levels. Upper chest: Clear. Other: None. IMPRESSION: No acute abnormality head or cervical spine. Mucous retention cyst or polyp right maxillary sinus noted. Electronically Signed   By: Drusilla Kanner M.D.   On: 12/04/2016 21:42     Assessment/Plan:  41 y.o. male presents with Falls. Patient states is been occurring over the past 2 weeks believes symptoms associated with change in his psychiatric medications.  Currently back to baseline but has essential tremor. Hx of seizures when he was a child.     Do not think this is seizurfes related EEG will be done today and likely d/c planning if safe from pt/ot stand point 12/05/2016, 1:40 PM

## 2016-12-05 NOTE — ED Provider Notes (Addendum)
[facility] Emergency Department Provider Note   ____________________________________________   I have reviewed the triage vital signs and the nursing notes.   HISTORY  Chief Complaint Fall and Extremity Laceration    HPI Russell Buchanan is a 41 y.o. male presented with right elbow pain and a laceration to the elbow after falling earlier today. Patient appears to be a poor historian and is unable to clearly tell me the timeline of how he injured his elbow. Patient reports he did fall sustaining the small laceration to the right elbow. Patient appears very anxious, states he is "ready to leave and would like to be treated". Patient denies fever, chills, headache, vision changes, chest pain, chest tightness, shortness of breath, abdominal pain, nausea and vomiting.  Past Medical History:  Diagnosis Date  . Antisocial personality disorder   . GERD (gastroesophageal reflux disease)   . Hyperlipidemia   . Schizophrenia [facility])     Patient Active Problem List   Diagnosis Date Noted  . Acute encephalopathy 12/04/2016  . GERD (gastroesophageal reflux disease) 12/04/2016  . HLD (hyperlipidemia) 12/04/2016  . Schizophrenia (HCC) 11/15/2016  . Medication side effects 11/15/2016    Past Surgical History:  Procedure Laterality Date  . ORIF ELBOW FRACTURE Right   . WRIST RECONSTRUCTION Left     Prior to Admission medications   Medication Sig Start Date End Date Taking? Authorizing Provider  albuterol (PROVENTIL HFA;VENTOLIN HFA) 108 (90 Base) MCG/ACT inhaler Inhale 1-2 puffs into the lungs every 4 (four) hours as needed for wheezing or shortness of breath.    [provider]  benztropine (COGENTIN) 1 MG tablet Take 1 mg by mouth 2 (two) times daily as needed.     [provider]  bismuth subsalicylate (PEPTO BISMOL) 262 MG/15ML suspension Take 30 mLs by mouth every 6 (six) hours as needed.    [provider]  buPROPion (WELLBUTRIN  XL) 150 MG 24 hr tablet Take 150 mg by mouth daily.    [provider]  Butalbital-APAP-Caffeine 50-325-40 MG per capsule Take 1 capsule by mouth 2 (two) times daily as needed.     [provider]  cetirizine (ZYRTEC) 10 MG tablet Take 10 mg by mouth daily.    [provider]  chlorproMAZINE (THORAZINE) 200 MG tablet Take 200 mg by mouth at bedtime.     [provider]  clotrimazole-betamethasone (LOTRISONE) cream Apply 1 application topically 3 (three) times daily.    [provider]  divalproex (DEPAKOTE ER) 500 MG 24 hr tablet Take 500-1,000 mg by mouth 2 (two) times daily. Take 500 mg oral by mouth in the morning and take 1000 mg by mouth at bedtime.    [provider]  docusate sodium (COLACE) 100 MG capsule Take 100 mg by mouth daily.    [provider]  EPINEPHrine 0.3 mg/0.3 mL IJ SOAJ injection Inject 0.015 mg into the muscle as needed.    [provider]  escitalopram (LEXAPRO) 20 MG tablet Take 20 mg by mouth daily.    [provider]  gemfibrozil (LOPID) 600 MG tablet Take 600 mg by mouth 2 (two) times daily before a meal.    [provider]  haloperidol (HALDOL) 10 MG tablet Take 10 mg by mouth 2 (two) times daily as needed.     [provider]  ketoconazole (NIZORAL) 2 % cream Apply 1 application topically 2 (two) times daily.    [provider]  lidocaine (LIDODERM) 5 % Place  1 patch onto the skin every 12 (twelve) hours. Remove & Discard patch within 12 hours or as directed by MD.  Wynelle Fanny the patch off for 12 hours before applying a new one. Patient not taking: Reported on 11/08/2016 09/21/16 09/21/17  Loleta Rose, MD  lipase/protease/amylase (CREON) 36000 UNITS CPEP capsule Take 36,000-72,000 Units by mouth See admin instructions. tk 2 capsules with meals, and 1 with snacks.    [provider]  LORazepam (ATIVAN) 1 MG tablet Take 1 mg by mouth every 8 (eight) hours as  needed for anxiety.    [provider]  magnesium gluconate (MAGONATE) 500 MG tablet Take 500 mg by mouth daily.    [provider]  nystatin cream (MYCOSTATIN) Apply 1 application topically 2 (two) times daily as needed for dry skin.     [provider]  ondansetron (ZOFRAN) 8 MG tablet Take 8 mg by mouth every 8 (eight) hours as needed.     [provider]  Paliperidone Palmitate (INVEGA TRINZA) 819 MG/2.625ML SUSP Inject 1 Dose into the muscle.    [provider]  pantoprazole (PROTONIX) 40 MG tablet Take 40 mg by mouth 2 (two) times daily.     [provider]  promethazine (PHENERGAN) 25 MG tablet Take 25 mg by mouth every 6 (six) hours as needed for nausea or vomiting.    [provider]  propranolol (INDERAL) 20 MG tablet Take 20 mg by mouth every 8 (eight) hours as needed (TREMORS).     [provider]  propranolol (INNOPRAN XL) 80 MG 24 hr capsule Take 80 mg by mouth daily.    [provider]  QUEtiapine (SEROQUEL XR) 400 MG 24 hr tablet Take 800 mg by mouth at bedtime.    [provider]  QUEtiapine (SEROQUEL) 200 MG tablet Take 200 mg by mouth at bedtime.    [provider]  risperidone (RISPERDAL) 4 MG tablet Take 2 mg by mouth 2 (two) times daily as needed.     [provider]  SUMAtriptan (IMITREX) 100 MG tablet Take 100 mg by mouth every 2 (two) hours as needed for migraine. May repeat in 2 hours if headache persists or recurs.    [provider]  tiotropium (SPIRIVA) 18 MCG inhalation capsule Place 18 mcg into inhaler and inhale daily.    [provider]    Allergies Bee venom  Family History  Problem Relation Age of Onset  . Family history unknown: Yes    Social History Social History  Substance Use Topics  . Smoking status: Current Every Day Smoker    Packs/day: 1.00    Types: Cigarettes  . Smokeless tobacco: Never Used  . Alcohol use No     Review of Systems Constitutional: Negative for fever/chills Cardiovascular: Denies chest pain. Respiratory: Denies cough Denies shortness of breath. Gastrointestinal: No abdominal pain. Positive nausea Musculoskeletal: Right elbow pain Skin: Negative for rash. Small laceration to the right elbow hemorrhage control. Neurological: Negative for headaches. Unable to ambulate very unsteady. ____________________________________________   PHYSICAL EXAM:  VITAL SIGNS: ED Triage Vitals  Enc Vitals Group     BP 12/04/16 1929 128/89     Pulse Rate 12/04/16 1929 98     Resp 12/04/16 1929 20     Temp 12/04/16 1929 99.2 F (37.3 C)     Temp Source 12/04/16 1929 Oral     SpO2 12/04/16 1929 99 %     Weight 12/04/16 1924 225 lb (102.1 kg)  Height 12/04/16 1924 5\' 11"  (1.803 m)     Head Circumference --      Peak Flow --      Pain Score 12/04/16 1923 2     Pain Loc --      Pain Edu? --      Excl. in GC? --     Constitutional: Alert and Oriented to name and place very distressed and anxious  Head: Normocephalic and atraumatic.  Eyes: Conjunctivae are normal. PERRL. Cardiovascular: Normal rate, regular rhythm. Normal distal pulses. Respiratory: Normal respiratory effort.  Musculoskeletal: Right elbow pain increases with range of motion. Otherwise nontender with normal range of motion in all extremities. Neurologic: Speech and language at his baseline. No gross focal neurologic deficits are appreciated. Unsteady gait.  Skin:  Skin is warm, dry and intact. No rash noted. Laceration at the right elbow, hemorrhage controlled superficial. Laceration approximately 1.5 cm Psychiatric: Mood and affect are normal.  ____________________________________________   LABS (all labs ordered are listed, but only abnormal results are displayed)  Labs Reviewed  GLUCOSE, CAPILLARY - Abnormal; Notable for the following:       Result Value   Glucose-Capillary 111 (*)    All other components within  normal limits  ACETAMINOPHEN LEVEL - Abnormal; Notable for the following:    Acetaminophen (Tylenol), Serum <10 (*)    All other components within normal limits  COMPREHENSIVE METABOLIC PANEL - Abnormal; Notable for the following:    Glucose, Bld 104 (*)    ALT 12 (*)    All other components within normal limits  TROPONIN I  ETHANOL  CBC WITH DIFFERENTIAL/PLATELET  SALICYLATE LEVEL  VALPROIC ACID LEVEL  URINALYSIS, COMPLETE (UACMP) WITH MICROSCOPIC  URINE DRUG SCREEN, QUALITATIVE ([facility] ONLY)   ____________________________________________  EKG None ____________________________________________  RADIOLOGY DG right elbow FINDINGS: Plate and screw fixation device noted in the proximal ulna related to remote injury. No acute fracture, subluxation or dislocation. No joint effusion.  IMPRESSION: No acute bony abnormality. ____________________________________________   PROCEDURES  Procedure(s) performed:  LACERATION REPAIR Performed by: Clois Comber Authorized by: Clois Comber Consent: Verbal consent obtained. Risks and benefits: risks, benefits and alternatives were discussed Consent given by: patient Patient identity confirmed: provided demographic data Prepped and Draped in normal sterile fashion Wound explored  Laceration Location: Right elbow  Laceration Length: 1.5 cm  No Foreign Bodies seen or palpated  Irrigation method: syringe Amount of cleaning: standard  Skin closure: Dermabond followed by Steri-Strips.   Patient tolerance: Patient tolerated the procedure well with no immediate complications.    Critical Care performed: no ____________________________________________   INITIAL IMPRESSION / ASSESSMENT AND PLAN / ED COURSE  Pertinent labs & imaging results that were available during my care of the patient were reviewed by me and considered in my medical decision making (see chart for details).   Patient presented with right elbow pain and  small laceration. Physical examination imaging's reassuring of no acute fracture. Laceration was repaired as above without complications in the procedure note as above. While preparing patient for discharge patient became diaphoretic, pale and less alert. IV access was obtained and patient was transferred to the major side of the emergency department. Care was transferred to Dr. Vincenza Hews.  ----------------------------------------- 20:45 on 12/04/2016 -----------------------------------------   ____________________________________________   FINAL CLINICAL IMPRESSION(S) / ED DIAGNOSES  Final diagnoses:  Altered mental status, unspecified altered mental status type       NEW MEDICATIONS STARTED DURING THIS VISIT:  New Prescriptions  No medications on file     Note:  This document was prepared using Dragon voice recognition software and may include unintentional dictation errors.    Clois Comber, PA-C 12/05/16 3299    Sharyn Creamer, MD 12/06/16 0046    Clois Comber, PA-C 12/07/16 1456    Sharyn Creamer, MD 12/15/16 [phone]

## 2016-12-05 NOTE — Clinical Social Work Note (Signed)
Clinical Social Work Assessment  Patient Details  Name: Makana Rostad MRN: [id number] Date of Birth: 12/12/1975  Date of referral:  12/05/16               Reason for consult:  Other (Comment Required) (Patient is from [facility]. )                Permission sought to share information with:  Facility Art therapist granted to share information::  Yes, Verbal Permission Granted  Name::        Agency::     Relationship::     Contact Information:     Housing/Transportation Living arrangements for the past 2 months:  Group Home Source of Information:  Patient, Facility Patient Interpreter Needed:  None Criminal Activity/Legal Involvement Pertinent to Current Situation/Hospitalization:  No - Comment as needed Significant Relationships:  None Lives with:  Facility Resident Do you feel safe going back to the place where you live?  Yes Need for family participation in patient care:  Yes (Comment)  Care giving concerns:  Patient is a resident at [facility] located at [place]. [place] [address], phone and fax [phone].   Social Worker assessment / plan:  Holiday representative (CSW) reviewed chart and noted that patient is from [facility]. CSW met with patient alone at bedside. Patient was alert and oriented X4 and was sitting up in the bed. CSW introduced self and explained role of CSW department. Patient reported that he has lived at the group home for 3 years and is independent with all his ADL's. Patient reported that he doesn't have a guardian or HPOA and Lavada Mesi the group home owner assists him when needed. Patient is agreeable to return to the group home when stable for D/C. CSW contacted group home and spoke to staff member Berenice Primas. Per Berenice Primas patient is on room air, is independent and makes his own decisions. Per Berenice Primas patient can return to the group home and group home will provide transport. CSW will continue to follow  and assist as needed.   Employment status:  Disabled (Comment on whether or not currently receiving Disability) Insurance information:  Medicaid [place] PT Recommendations:  Not assessed at this time Information / Referral to community resources:  Other (Comment Required) (Patient will return to family care home. )  Patient/Family's Response to care:  Patient is agreeable to D/C back to his group home.   Patient/Family's Understanding of and Emotional Response to Diagnosis, Current Treatment, and Prognosis:  Patient was very pleasant and thanked CSW for visit.   Emotional Assessment Appearance:  Appears stated age Attitude/Demeanor/Rapport:    Affect (typically observed):  Accepting, Adaptable, Pleasant Orientation:  Oriented to Self, Oriented to Place, Oriented to  Time, Oriented to Situation Alcohol / Substance use:  Not Applicable Psych involvement (Current and /or in the community):  No (Comment)  Discharge Needs  Concerns to be addressed:  Discharge Planning Concerns Readmission within the last 30 days:  No Current discharge risk:  Chronically ill, Psychiatric Illness Barriers to Discharge:  Continued Medical Work up   [employer], Veronia Beets, LCSW 12/05/2016, 12:59 PM

## 2016-12-05 NOTE — ED Notes (Signed)
Pt attempted to urinate, not successful at this time.

## 2016-12-05 NOTE — Plan of Care (Signed)
Problem: Education: Goal: Knowledge of [facility] Education information/materials will improve Outcome: Progressing Pt likes to be called Russell Buchanan  Past Medical History:  Diagnosis Date  . Antisocial personality disorder   . GERD (gastroesophageal reflux disease)   . Hyperlipidemia   . Schizophrenia (HCC)    Pt is well controlled with home medications

## 2016-12-05 NOTE — Evaluation (Signed)
Physical Therapy Evaluation Patient Details Name: Russell Buchanan MRN: [id number] DOB: 30-Dec-1975 Today's Date: 12/05/2016   History of Present Illness  Patient is a 41 y/o male that presents with repeated falls, unclear if due to seizures or alterations in medication dosages.   Clinical Impression  Patient reports he has had multiple falls over the past month, no clear pattern to his falls though he does report one of his medications does cause him to feel dizzy. He does not appear to have strength or power deficits, without device he has mild gait instability with modified DGI score of 10/12, though this is alleviated with SPC. Would recommend home safety eval and use of SPC at this time to reduce his risk of future falls in addition to his other medical work up.     Follow Up Recommendations Home health PT    Equipment Recommendations  Cane    Recommendations for Other Services       Precautions / Restrictions Precautions Precautions: Fall Restrictions Weight Bearing Restrictions: No      Mobility  Bed Mobility Overal bed mobility: Independent             General bed mobility comments: No deficits noted in bed mobility.   Transfers Overall transfer level: Independent               General transfer comment: No loss of balance or deficits noted.   Ambulation/Gait Ambulation/Gait assistance: Supervision Ambulation Distance (Feet): 400 Feet Assistive device: Straight cane Gait Pattern/deviations: WFL(Within Functional Limits)   Gait velocity interpretation: at or above normal speed for age/gender General Gait Details: Patient initially demonstrated sway during gait without device, with device he has no noticeable deficits.   Stairs            Wheelchair Mobility    Modified Rankin (Stroke Patients Only)       Balance Overall balance assessment: History of Falls;Modified Independent                                            Pertinent Vitals/Pain Pain Assessment: No/denies pain    Home Living Family/patient expects to be discharged to:: Group home Living Arrangements:  (Other individuals in the group home)                    Prior Function Level of Independence: Independent         Comments: Patient endorses multiple falls recently, no clear mechanism or physiologic antagonist reported.      Hand Dominance        Extremity/Trunk Assessment   Upper Extremity Assessment Upper Extremity Assessment: Overall WFL for tasks assessed    Lower Extremity Assessment Lower Extremity Assessment: Overall WFL for tasks assessed       Communication   Communication: No difficulties  Cognition Arousal/Alertness: Awake/alert Behavior During Therapy: WFL for tasks assessed/performed Overall Cognitive Status: History of cognitive impairments - at baseline                                        General Comments General comments (skin integrity, edema, etc.): Modified DGI without device 10/12    Exercises     Assessment/Plan    PT Assessment Patient needs continued PT services  PT Problem List Decreased balance  PT Treatment Interventions Stair training;Gait training;Therapeutic exercise;Therapeutic activities;DME instruction;Balance training;Neuromuscular re-education    PT Goals (Current goals can be found in the Care Plan section)  Acute Rehab PT Goals Patient Stated Goal: To return home  PT Goal Formulation: With patient Time For Goal Achievement: 12/19/16 Potential to Achieve Goals: Good    Frequency Min 2X/week   Barriers to discharge        Co-evaluation               AM-PAC PT "6 Clicks" Daily Activity  Outcome Measure Difficulty turning over in bed (including adjusting bedclothes, sheets and blankets)?: None Difficulty moving from lying on back to sitting on the side of the bed? : None Difficulty sitting down on and standing up from a  chair with arms (e.g., wheelchair, bedside commode, etc,.)?: None Help needed moving to and from a bed to chair (including a wheelchair)?: None Help needed walking in hospital room?: None Help needed climbing 3-5 steps with a railing? : None 6 Click Score: 24    End of Session Equipment Utilized During Treatment: Gait belt Activity Tolerance: Patient tolerated treatment well Patient left: in bed;with bed alarm set;with call bell/phone within reach Nurse Communication: Mobility status PT Visit Diagnosis: Unsteadiness on feet (R26.81)    Time: 1610-9604 PT Time Calculation (min) (ACUTE ONLY): 18 min   Charges:   PT Evaluation $PT Eval Moderate Complexity: 1 Procedure     PT G Codes:   PT G-Codes **NOT FOR INPATIENT CLASS** Functional Assessment Tool Used: AM-PAC 6 Clicks Basic Mobility Functional Limitation: Mobility: Walking and moving around Mobility: Walking and Moving Around Current Status (V4098): At least 1 percent but less than 20 percent impaired, limited or restricted Mobility: Walking and Moving Around Goal Status [phone]): At least 1 percent but less than 20 percent impaired, limited or restricted    Alva Garnet PT, DPT, CSCS    12/05/2016, 3:31 PM

## 2016-12-05 NOTE — ED Notes (Signed)
Pt currently attempting to urinate at this time, stand by assist with this RN.

## 2016-12-05 NOTE — ED Notes (Addendum)
Pt had episode of emesis, see MAR for follow up. Pt denies abd pain.

## 2016-12-05 NOTE — Consult Note (Signed)
[facility] Consult   Reason for Consult:  Consult for 40 year old man with history of schizophrenia who came into the hospital because of recurrent falls Referring Physician:  Vianne Bulls Patient Identification: Russell Buchanan MRN:  [id number] Principal Diagnosis: Schizophrenia [facility]) Diagnosis:   Patient Active Problem List   Diagnosis Date Noted  . Acute encephalopathy [G93.40] 12/04/2016  . GERD (gastroesophageal reflux disease) [K21.9] 12/04/2016  . HLD (hyperlipidemia) [E78.5] 12/04/2016  . Schizophrenia ([place]) [F20.9] 11/15/2016  . Medication side effects [T88.7XXA] 11/15/2016    Total Time spent with patient: 1 hour  Subjective:   Russell Buchanan is a 41 y.o. male patient admitted with "I am tired of all of these falls".  HPI:  Patient seen chart reviewed. Patient known from previous encounters. 41 year old man with schizophrenia. He came to the hospital because of recurrent falls. He says that since 2 weeks ago when I last spoke with him he has had at least 5 or 6 more occasions of falling. They usually happen when he is standing up although on one occasion he felt completely out of bed. He does not think he usually loses consciousness. Patient attributes the falls to the high doses of his medicine. Psychiatrically his mood is described as being fine. Not feeling depressed or irritable. Denies that he's been having any hallucinations at all recently. Denies suicidal or homicidal ideation. Denies any new stresses other than worrying about the falls. He claims that people will not allow him to talk to his doctors about his medicine doses. I looked over his MAR sheets from his group home and he truly is on a remarkably large amount of sedating medicine.  Medical history: History of gastric reflux hyperlipidemia hypertension possible seizure disorder  Social history: Patient has no family contact. He has a legal guardian and lives in a group home. Pretty stable social  situation.  Substance abuse history: Denies any alcohol or drug abuse no significant past problems of this sort.  Past Psychiatric History: Long-standing schizophrenia. Multiple prior hospitalizations. He has had suicidal thoughts and threats in the past but has not seriously tried to kill himself. Denies history of violence. Frequently has hallucinations. He is on a very large amount of medicine which seems to be stable over time.  Risk to Self: Is patient at risk for suicide?: No Risk to Others:   Prior Inpatient Therapy:   Prior Outpatient Therapy:    Past Medical History:  Past Medical History:  Diagnosis Date  . Antisocial personality disorder   . GERD (gastroesophageal reflux disease)   . Hyperlipidemia   . Schizophrenia [facility])     Past Surgical History:  Procedure Laterality Date  . ORIF ELBOW FRACTURE Right   . WRIST RECONSTRUCTION Left    Family History:  Family History  Problem Relation Age of Onset  . Family history unknown: Yes   Family Psychiatric  History: He is not aware of any family history Social History:  History  Alcohol Use No     History  Drug Use No    Social History   Social History  . Marital status: Single    Spouse name: N/A  . Number of children: N/A  . Years of education: N/A   Social History Main Topics  . Smoking status: Current Every Day Smoker    Packs/day: 1.00    Types: Cigarettes  . Smokeless tobacco: Never Used  . Alcohol use No  . Drug use: No  . Sexual activity: Not Currently   Other Topics  Concern  . None   Social History Narrative  . None   Additional Social History:    Allergies:   Allergies  Allergen Reactions  . Bee Venom Hives    Labs:  Results for orders placed or performed during the hospital encounter of 12/04/16 (from the past 48 hour(s))  Glucose, capillary     Status: Abnormal   Collection Time: 12/04/16  8:56 PM  Result Value Ref Range   Glucose-Capillary 111 (H) 65 - 99 mg/dL  Troponin I      Status: None   Collection Time: 12/04/16  9:07 PM  Result Value Ref Range   Troponin I <0.03 <0.03 ng/mL  Ethanol     Status: None   Collection Time: 12/04/16  9:07 PM  Result Value Ref Range   Alcohol, Ethyl (B) <5 <5 mg/dL    Comment:        LOWEST DETECTABLE LIMIT FOR SERUM ALCOHOL IS 5 mg/dL FOR MEDICAL PURPOSES ONLY   Acetaminophen level     Status: Abnormal   Collection Time: 12/04/16  9:07 PM  Result Value Ref Range   Acetaminophen (Tylenol), Serum <10 (L) 10 - 30 ug/mL    Comment:        THERAPEUTIC CONCENTRATIONS VARY SIGNIFICANTLY. A RANGE OF 10-30 ug/mL MAY BE AN EFFECTIVE CONCENTRATION FOR MANY PATIENTS. HOWEVER, SOME ARE BEST TREATED AT CONCENTRATIONS OUTSIDE THIS RANGE. ACETAMINOPHEN CONCENTRATIONS >150 ug/mL AT 4 HOURS AFTER INGESTION AND >50 ug/mL AT 12 HOURS AFTER INGESTION ARE OFTEN ASSOCIATED WITH TOXIC REACTIONS.   CBC with Differential     Status: None   Collection Time: 12/04/16  9:07 PM  Result Value Ref Range   WBC 7.9 3.8 - 10.6 K/uL   RBC 5.02 4.40 - 5.90 MIL/uL   Hemoglobin 15.3 13.0 - 18.0 g/dL   HCT 44.0 40.0 - 52.0 %   MCV 87.7 80.0 - 100.0 fL   MCH 30.4 26.0 - 34.0 pg   MCHC 34.6 32.0 - 36.0 g/dL   RDW 14.1 11.5 - 14.5 %   Platelets 280 150 - 440 K/uL   Neutrophils Relative % 49 %   Neutro Abs 3.9 1.4 - 6.5 K/uL   Lymphocytes Relative 38 %   Lymphs Abs 3.0 1.0 - 3.6 K/uL   Monocytes Relative 12 %   Monocytes Absolute 0.9 0.2 - 1.0 K/uL   Eosinophils Relative 0 %   Eosinophils Absolute 0.0 0 - 0.7 K/uL   Basophils Relative 1 %   Basophils Absolute 0.0 0 - 0.1 K/uL  Salicylate level     Status: None   Collection Time: 12/04/16  9:07 PM  Result Value Ref Range   Salicylate Lvl <0.2 2.8 - 30.0 mg/dL  Comprehensive metabolic panel     Status: Abnormal   Collection Time: 12/04/16  9:07 PM  Result Value Ref Range   Sodium 140 135 - 145 mmol/L   Potassium 4.0 3.5 - 5.1 mmol/L   Chloride 105 101 - 111 mmol/L   CO2 25 22 - 32 mmol/L    Glucose, Bld 104 (H) 65 - 99 mg/dL   BUN 9 6 - 20 mg/dL   Creatinine, Ser 1.01 0.61 - 1.24 mg/dL   Calcium 9.3 8.9 - 10.3 mg/dL   Total Protein 6.6 6.5 - 8.1 g/dL   Albumin 3.6 3.5 - 5.0 g/dL   AST 22 15 - 41 U/L   ALT 12 (L) 17 - 63 U/L   Alkaline Phosphatase 85 38 - 126 U/L  Total Bilirubin 0.5 0.3 - 1.2 mg/dL   GFR calc non Af Amer >60 >60 mL/min   GFR calc Af Amer >60 >60 mL/min    Comment: (NOTE) The eGFR has been calculated using the CKD EPI equation. This calculation has not been validated in all clinical situations. eGFR's persistently <60 mL/min signify possible Chronic Kidney Disease.    Anion gap 10 5 - 15  Valproic acid level     Status: None   Collection Time: 12/04/16  9:07 PM  Result Value Ref Range   Valproic Acid Lvl 71 50.0 - 100.0 ug/mL  Urinalysis, Complete w Microscopic     Status: Abnormal   Collection Time: 12/05/16 12:52 AM  Result Value Ref Range   Color, Urine YELLOW (A) YELLOW   APPearance CLEAR (A) CLEAR   Specific Gravity, Urine 1.012 1.005 - 1.030   pH 7.0 5.0 - 8.0   Glucose, UA NEGATIVE NEGATIVE mg/dL   Hgb urine dipstick NEGATIVE NEGATIVE   Bilirubin Urine NEGATIVE NEGATIVE   Ketones, ur 5 (A) NEGATIVE mg/dL   Protein, ur NEGATIVE NEGATIVE mg/dL   Nitrite NEGATIVE NEGATIVE   Leukocytes, UA NEGATIVE NEGATIVE   RBC / HPF 0-5 0 - 5 RBC/hpf   WBC, UA 0-5 0 - 5 WBC/hpf   Bacteria, UA NONE SEEN NONE SEEN   Squamous Epithelial / LPF NONE SEEN NONE SEEN  Urine Drug Screen, Qualitative     Status: Abnormal   Collection Time: 12/05/16 12:52 AM  Result Value Ref Range   Tricyclic, Ur Screen POSITIVE (A) NONE DETECTED   Amphetamines, Ur Screen NONE DETECTED NONE DETECTED   MDMA (Ecstasy)Ur Screen NONE DETECTED NONE DETECTED   Cocaine Metabolite,Ur [place] NONE DETECTED NONE DETECTED   Opiate, Ur Screen NONE DETECTED NONE DETECTED   Phencyclidine (PCP) Ur S NONE DETECTED NONE DETECTED   Cannabinoid 50 Ng, Ur [place] NONE DETECTED NONE DETECTED    Barbiturates, Ur Screen NONE DETECTED NONE DETECTED   Benzodiazepine, Ur Scrn NONE DETECTED NONE DETECTED   Methadone Scn, Ur NONE DETECTED NONE DETECTED    Comment: (NOTE) 253  Tricyclics, urine               Cutoff 1000 ng/mL 200  Amphetamines, urine             Cutoff 1000 ng/mL 300  MDMA (Ecstasy), urine           Cutoff 500 ng/mL 400  Cocaine Metabolite, urine       Cutoff 300 ng/mL 500  Opiate, urine                   Cutoff 300 ng/mL 600  Phencyclidine (PCP), urine      Cutoff 25 ng/mL 700  Cannabinoid, urine              Cutoff 50 ng/mL 800  Barbiturates, urine             Cutoff 200 ng/mL 900  Benzodiazepine, urine           Cutoff 200 ng/mL 1000 Methadone, urine                Cutoff 300 ng/mL 1100 1200 The urine drug screen provides only a preliminary, unconfirmed 1300 analytical test result and should not be used for non-medical 1400 purposes. Clinical consideration and professional judgment should 1500 be applied to any positive drug screen result due to possible 1600 interfering substances. A more specific alternate chemical method 1700 must be  used in order to obtain a confirmed analytical result.  1800 Gas chromato graphy / mass spectrometry (GC/MS) is the preferred 1900 confirmatory method.   MRSA PCR Screening     Status: None   Collection Time: 12/05/16  3:02 AM  Result Value Ref Range   MRSA by PCR NEGATIVE NEGATIVE    Comment:        The GeneXpert MRSA Assay (FDA approved for NASAL specimens only), is one component of a comprehensive MRSA colonization surveillance program. It is not intended to diagnose MRSA infection nor to guide or monitor treatment for MRSA infections.   Basic metabolic panel     Status: Abnormal   Collection Time: 12/05/16  5:34 AM  Result Value Ref Range   Sodium 142 135 - 145 mmol/L   Potassium 3.9 3.5 - 5.1 mmol/L   Chloride 106 101 - 111 mmol/L   CO2 27 22 - 32 mmol/L   Glucose, Bld 103 (H) 65 - 99 mg/dL   BUN 10 6 - 20  mg/dL   Creatinine, Ser 0.98 0.61 - 1.24 mg/dL   Calcium 9.2 8.9 - 10.3 mg/dL   GFR calc non Af Amer >60 >60 mL/min   GFR calc Af Amer >60 >60 mL/min    Comment: (NOTE) The eGFR has been calculated using the CKD EPI equation. This calculation has not been validated in all clinical situations. eGFR's persistently <60 mL/min signify possible Chronic Kidney Disease.    Anion gap 9 5 - 15  CBC     Status: None   Collection Time: 12/05/16  5:34 AM  Result Value Ref Range   WBC 6.8 3.8 - 10.6 K/uL   RBC 4.70 4.40 - 5.90 MIL/uL   Hemoglobin 14.5 13.0 - 18.0 g/dL   HCT 41.5 40.0 - 52.0 %   MCV 88.3 80.0 - 100.0 fL   MCH 30.8 26.0 - 34.0 pg   MCHC 34.9 32.0 - 36.0 g/dL   RDW 14.2 11.5 - 14.5 %   Platelets 245 150 - 440 K/uL    Current Facility-Administered Medications  Medication Dose Route Frequency Provider Last Rate Last Dose  . acetaminophen (TYLENOL) tablet 650 mg  650 mg Oral Q6H PRN Lance Coon, MD       Or  . acetaminophen (TYLENOL) suppository 650 mg  650 mg Rectal Q6H PRN Lance Coon, MD      . bismuth subsalicylate (PEPTO BISMOL) 262 MG/15ML suspension 30 mL  30 mL Oral Q6H PRN Lance Coon, MD      . buPROPion (WELLBUTRIN XL) 24 hr tablet 150 mg  150 mg Oral Daily Lance Coon, MD   150 mg at 12/05/16 0905  . divalproex (DEPAKOTE ER) 24 hr tablet 500 mg  500 mg Oral q morning - 10a Lance Coon, MD   500 mg at 12/05/16 1610   And  . divalproex (DEPAKOTE ER) 24 hr tablet 1,000 mg  1,000 mg Oral Corwin Levins, MD      . enoxaparin (LOVENOX) injection 40 mg  40 mg Subcutaneous Q24H Lance Coon, MD   40 mg at 12/05/16 0905  . escitalopram (LEXAPRO) tablet 20 mg  20 mg Oral Daily Lance Coon, MD   20 mg at 12/05/16 0905  . lipase/protease/amylase (CREON) capsule 72,000 Units  72,000 Units Oral TID WC Lance Coon, MD   72,000 Units at 12/05/16 1126   And  . lipase/protease/amylase (CREON) capsule 36,000 Units  36,000 Units Oral TID BM PRN Lance Coon, MD       .  ondansetron (ZOFRAN) tablet 4 mg  4 mg Oral Q6H PRN Lance Coon, MD       Or  . ondansetron [place]) injection 4 mg  4 mg Intravenous Q6H PRN Lance Coon, MD   4 mg at 12/05/16 1030  . pantoprazole (PROTONIX) EC tablet 40 mg  40 mg Oral BID Lance Coon, MD   40 mg at 12/05/16 0905  . [START ON 12/06/2016] pneumococcal 23 valent vaccine (PNU-IMMUNE) injection 0.5 mL  0.5 mL Intramuscular Tomorrow-1000 Lance Coon, MD      . prochlorperazine (COMPAZINE) injection 10 mg  10 mg Intravenous Q6H PRN Harrie Foreman, MD   10 mg at 12/05/16 1124  . propranolol ER (INDERAL LA) 24 hr capsule 80 mg  80 mg Oral Daily Henreitta Leber, MD   80 mg at 12/05/16 1018  . QUEtiapine (SEROQUEL XR) 24 hr tablet 800 mg  800 mg Oral QHS Lance Coon, MD      . tiotropium [facility]) inhalation capsule 18 mcg  18 mcg Inhalation Daily Lance Coon, MD   18 mcg at 12/05/16 0906    Musculoskeletal: Strength & Muscle Tone: within normal limits Gait & Station: Not tested Patient leans: N/A  Psychiatric Specialty Exam: Physical Exam  Nursing note and vitals reviewed. Constitutional: He appears well-developed and well-nourished.  HENT:  Head: Normocephalic and atraumatic.  Eyes: Conjunctivae are normal. Pupils are equal, round, and reactive to light.  Neck: Normal range of motion.  Cardiovascular: Regular rhythm and normal heart sounds.   Respiratory: Effort normal. No respiratory distress.  GI: Soft.  Musculoskeletal: Normal range of motion.  Neurological: He is alert.  Skin: Skin is warm and dry.  Psychiatric: He has a normal mood and affect. His speech is normal and behavior is normal. Judgment and thought content normal. Cognition and memory are normal.    Review of Systems  Constitutional: Negative.   HENT: Negative.   Eyes: Negative.   Respiratory: Negative.   Cardiovascular: Negative.   Gastrointestinal: Negative.   Musculoskeletal: Positive for falls.  Skin: Negative.   Neurological:  Negative.   Psychiatric/Behavioral: Negative for depression, hallucinations, memory loss, substance abuse and suicidal ideas. The patient is not nervous/anxious and does not have insomnia.     Blood pressure 102/77, pulse 87, temperature 97.9 F (36.6 C), temperature source Oral, resp. rate 18, height '5\' 11"'  (1.803 m), weight 225 lb (102.1 kg), SpO2 97 %.Body mass index is 31.38 kg/m.  General Appearance: Fairly Groomed  Eye Contact:  Good  Speech:  Clear and Coherent  Volume:  Normal  Mood:  Euthymic  Affect:  Constricted  Thought Process:  Goal Directed  Orientation:  Full (Time, Place, and Person)  Thought Content:  Logical  Suicidal Thoughts:  No  Homicidal Thoughts:  No  Memory:  Immediate;   Fair Recent;   Fair Remote;   Fair  Judgement:  Fair  Insight:  Fair  Psychomotor Activity:  Normal  Concentration:  Concentration: Fair  Recall:  [employer] of Knowledge:  Fair  Language:  Fair  Akathisia:  No  Handed:  Right  AIMS (if indicated):     Assets:  Communication Skills Desire for Improvement Housing Resilience Social Support  ADL's:  Intact  Cognition:  WNL  Sleep:        Treatment Plan Summary: Daily contact with patient to assess and evaluate symptoms and progress in treatment, Medication management and Plan 41 year old man with schizophrenia. This is not the first time he has  come into the hospital because of recurrent falls. I'm not sure if any of these have been fully witnessed. Neurology does not feel that they are likely related to seizures. Patient feels convinced that it is related to his medicine. I would certainly be possible given the large amount of medicine that he is given. I looked at his [facility] sheets and it looks like he really is given pretty much full doses of everything listed in his reconciliation including 1000 mg of quetiapine at night, 200 mg of Thorazine at night, 1 mg of Ativan 2-3 times a day his Depakote as well as the propranolol and other  medicines. His Depakote level appears to be normal. That is probably not likely the cause. I think there would be good reason to discontinue the Thorazine. He hasn't had a full-blown serious psychotic episode requiring intervention in quite a long time. I note that Thorazine was not prescribed here in the hospital and I agree with that I would continue staying off of that outpatient if that is possible. He also for some reason is on both long-acting and short-acting Seroquel. The 200 mg of plain Seroquel can probably be discontinued. I doubt that doing either one of these would affect his mental state particularly since he is still getting a long-acting injectable antipsychotic shot every month. I encouraged the patient to talk with his outpatient psychiatrist about this. He claims that people will not "let" him do that but I told him he really needs to stand up for himself. Hopefully we can make some changes at least at the time of discharge. No need to change anything as far as any psychiatric symptoms. I will follow-up as needed.  Disposition: Patient does not meet criteria for psychiatric inpatient admission. Supportive therapy provided about ongoing stressors.  Alethia Berthold, MD 12/05/2016 3:23 PM

## 2016-12-06 MED ORDER — BUTALBITAL-APAP-CAFFEINE 50-325-40 MG PO TABS
1.0000 | ORAL_TABLET | Freq: Two times a day (BID) | ORAL | Status: DC | PRN
  Administered 2016-12-06: 1 via ORAL
  Filled 2016-12-06 (×2): qty 1

## 2016-12-06 NOTE — Clinical Social Work Note (Signed)
Pt is ready for discharge today and will return to [facility]. Pt is aware and agreeable to discharge plan. Facility is ready to accept pt. Discharge packet has been arranged. [employer] will provide transportation under th contract with [facility]. RN is aware. CSW is signing off as no further needs identified.   Dede Query, MSW, LCSW  Clinical Social Worker  [phone]

## 2016-12-06 NOTE — Discharge Summary (Signed)
[facility] [place] [facility]   PATIENT NAME: Russell Buchanan    MR#:  [id number]  DATE OF BIRTH:  04/06/1976  DATE OF ADMISSION:  12/04/2016 ADMITTING PHYSICIAN: Oralia Manis, MD  DATE OF DISCHARGE: 12/06/2016  PRIMARY CARE PHYSICIAN: Armando Gang, FNP    ADMISSION DIAGNOSIS:  Altered mental status, unspecified altered mental status type [R41.82]  DISCHARGE DIAGNOSIS:  Principal Problem:   Schizophrenia (HCC) Active Problems:   Acute encephalopathy   GERD (gastroesophageal reflux disease)   HLD (hyperlipidemia)   SECONDARY DIAGNOSIS:   Past Medical History:  Diagnosis Date  . Antisocial personality disorder   . GERD (gastroesophageal reflux disease)   . Hyperlipidemia   . Schizophrenia [facility])     HOSPITAL COURSE:   41 year old male with past medical history of schizophrenia, Hyperlipidemia, GERD, hx of anti-social personality disorder who presents to the hospital due to tremors, increasing falls.  1. Acute encephalopathy-etiology unclear but suspected to be related to polypharmacy. - pt. Was admitted and a Neurology and Psychiatric consult was obtained. Shortly after admission pt's mental status was back to baseline.  - there was some concern for possible seizures but pt. Has had no episodes while in the hospital. He had an EEG done which was negative for seizures.  He was also seen by Psychiatry who recommended discontinuing his thorazine and short acting Seroquel.  Further med changes to be done by his Outpatient Psychiatrist.  Pt. Did not meet any criteria for inpatient Psychiatric treatment.   - CT scan of the head and cervical spine negative for acute abnormality while in the hospital.   2. Recurrent falls/weakness- seen by PT and they recommend Home Health PT which is being arranged for patient prior to discharge.   3. History of schizophrenia-continue Wellbutrin, Depakote, Lexapro, Seroquel. - appreciate Psychiatric input and pt's meds  adjusted as mentioned above.  He will cont. Follow up with his outpatient Psychiatrist.    4. 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  5. History of chronic pancreatitis- he will continue Creon supplements  6. Hx of Migraines - he will cont. His fiorcet.   7. COPD - pt. Had no acute exacerbation while in the hospital.  - pt. Will cont. Spiriva, albuterol inhaler as needed.   DISCHARGE CONDITIONS:   Stable  CONSULTS OBTAINED:  Treatment Team:  Pauletta Browns, MD [place], Jackquline Denmark, MD  DRUG ALLERGIES:   Allergies  Allergen Reactions  . Bee Venom Hives    DISCHARGE MEDICATIONS:   Allergies as of 12/06/2016      Reactions   Bee Venom Hives      Medication List    STOP taking these medications   chlorproMAZINE 200 MG tablet Commonly known as:  THORAZINE     TAKE these medications   albuterol 108 (90 Base) MCG/ACT inhaler Commonly known as:  PROVENTIL HFA;VENTOLIN HFA Inhale 1-2 puffs into the lungs every 4 (four) hours as needed for wheezing or shortness of breath.   benztropine 1 MG tablet Commonly known as:  COGENTIN Take 1 mg by mouth 2 (two) times daily as needed for tremors.   buPROPion 150 MG 24 hr tablet Commonly known as:  WELLBUTRIN XL Take 150 mg by mouth daily.   Butalbital-APAP-Caffeine 50-325-40 MG capsule Take 1 capsule by mouth 2 (two) times daily as needed for headache.   cetirizine 10 MG tablet Commonly known as:  ZYRTEC Take 10 mg by mouth daily.   clotrimazole-betamethasone cream Commonly known as:  LOTRISONE Apply 1 application topically 3 (three) times daily.   CREON 36000 UNITS Cpep capsule Generic drug:  lipase/protease/amylase Take 36,000 Units by mouth 3 (three) times daily between meals as needed (with snacks).   lipase/protease/amylase 21308 UNITS Cpep capsule Commonly known as:  CREON Take 72,000 Units by mouth 3 (three) times daily with meals.   divalproex 500 MG 24 hr tablet Commonly known as:   DEPAKOTE ER Take 500-1,000 mg by mouth 2 (two) times daily. Take 500 mg oral by mouth in the morning and take 1000 mg by mouth at bedtime.   docusate sodium 100 MG capsule Commonly known as:  COLACE Take 100 mg by mouth daily.   EPINEPHrine 0.3 mg/0.3 mL Soaj injection Commonly known as:  EPI-PEN Inject 0.3 mg into the muscle as needed (allergic reactions).   escitalopram 20 MG tablet Commonly known as:  LEXAPRO Take 20 mg by mouth daily.   gemfibrozil 600 MG tablet Commonly known as:  LOPID Take 600 mg by mouth 2 (two) times daily before a meal.   haloperidol 10 MG tablet Commonly known as:  HALDOL Take 10 mg by mouth 2 (two) times daily as needed (agitation).   INVEGA TRINZA 819 MG/2.625ML Susp Generic drug:  Paliperidone Palmitate Inject 1 Dose into the muscle every 3 (three) months.   ketoconazole 2 % cream Commonly known as:  NIZORAL Apply 1 application topically 2 (two) times daily.   LORazepam 1 MG tablet Commonly known as:  ATIVAN Take 1 mg by mouth every 8 (eight) hours as needed for anxiety.   magnesium gluconate 500 MG tablet Commonly known as:  MAGONATE Take 500 mg by mouth daily.   nystatin cream Commonly known as:  MYCOSTATIN Apply 1 application topically 2 (two) times daily as needed for dry skin.   ondansetron 8 MG tablet Commonly known as:  ZOFRAN Take 8 mg by mouth every 8 (eight) hours as needed for nausea or vomiting.   pantoprazole 40 MG tablet Commonly known as:  PROTONIX Take 40 mg by mouth 2 (two) times daily.   propranolol 20 MG tablet Commonly known as:  INDERAL Take 20 mg by mouth every 8 (eight) hours as needed (TREMORS).   propranolol 80 MG 24 hr capsule Commonly known as:  INNOPRAN XL Take 80 mg by mouth daily.   QUEtiapine 400 MG 24 hr tablet Commonly known as:  SEROQUEL XR Take 800 mg by mouth at bedtime.   risperidone 4 MG tablet Commonly known as:  RISPERDAL Take 2 mg by mouth 2 (two) times daily as needed (severe  agitation).   SUMAtriptan 100 MG tablet Commonly known as:  IMITREX Take 100 mg by mouth every 2 (two) hours as needed for migraine. May repeat in 2 hours if headache persists or recurs.   tiotropium 18 MCG inhalation capsule Commonly known as:  SPIRIVA Place 18 mcg into inhaler and inhale daily.         DISCHARGE INSTRUCTIONS:   DIET:  Regular diet  DISCHARGE CONDITION:  Stable  ACTIVITY:  Activity as tolerated  OXYGEN:  Home Oxygen: No.   Oxygen Delivery: room air  DISCHARGE LOCATION:  group home with Home Health PT  If you experience worsening of your admission symptoms, develop shortness of breath, life threatening emergency, suicidal or homicidal thoughts you must seek medical attention immediately by calling 911 or calling your MD immediately  if symptoms less severe.  You Must read complete instructions/literature along with all the possible adverse reactions/side effects for  all the Medicines you take and that have been prescribed to you. Take any new Medicines after you have completely understood and accpet all the possible adverse reactions/side effects.   Please note  You were cared for by a hospitalist during your hospital stay. If you have any questions about your discharge medications or the care you received while you were in the hospital after you are discharged, you can call the unit and asked to speak with the hospitalist on call if the hospitalist that took care of you is not available. Once you are discharged, your primary care physician will handle any further medical issues. Please note that NO REFILLS for any discharge medications will be authorized once you are discharged, as it is imperative that you return to your primary care physician (or establish a relationship with a primary care physician if you do not have one) for your aftercare needs so that they can reassess your need for medications and monitor your lab values.     Today   Having a  headache due to Migraine this a.m. Seen by PT and they recommend Home health services.  EEG negative yesterday and seen by Psych and meds adjusted.   VITAL SIGNS:  Blood pressure 113/80, pulse 90, temperature 98.3 F (36.8 C), temperature source Oral, resp. rate (!) 21, height 5\' 11"  (1.803 m), weight 102.1 kg (225 lb), SpO2 96 %.  I/O:   Intake/Output Summary (Last 24 hours) at 12/06/16 0921 Last data filed at 12/05/16 1845  Gross per 24 hour  Intake             1080 ml  Output              600 ml  Net              480 ml    PHYSICAL EXAMINATION:   GENERAL:  41 y.o.-year-old patient lying in bed in no acute distress.  EYES: Pupils equal, round, reactive to light and accommodation. No scleral icterus. Extraocular muscles intact.  HEENT: Head atraumatic, normocephalic. Oropharynx and nasopharynx clear.  NECK:  Supple, no jugular venous distention. No thyroid enlargement, no tenderness.  LUNGS: Normal breath sounds bilaterally, no wheezing, rales, rhonchi. No use of accessory muscles of respiration.  CARDIOVASCULAR: S1, S2 normal. No murmurs, rubs, or gallops.  ABDOMEN: Soft, nontender, nondistended. Bowel sounds present. No organomegaly or mass.  EXTREMITIES: No cyanosis, clubbing or edema b/l.    NEUROLOGIC: Cranial nerves II through XII are intact. No focal Motor or sensory deficits b/l. + intentional Tremors noted.  PSYCHIATRIC: The patient is alert and oriented x 3.  SKIN: No obvious rash, lesion, or ulcer.   DATA REVIEW:   CBC  Recent Labs Lab 12/05/16 0534  WBC 6.8  HGB 14.5  HCT 41.5  PLT 245    Chemistries   Recent Labs Lab 12/04/16 2107 12/05/16 0534  NA 140 142  K 4.0 3.9  CL 105 106  CO2 25 27  GLUCOSE 104* 103*  BUN 9 10  CREATININE 1.01 0.98  CALCIUM 9.3 9.2  AST 22  --   ALT 12*  --   ALKPHOS 85  --   BILITOT 0.5  --     Cardiac Enzymes  Recent Labs Lab 12/04/16 2107  TROPONINI <0.03    Microbiology Results  Results for orders  placed or performed during the hospital encounter of 12/04/16  MRSA PCR Screening     Status: None   Collection Time: 12/05/16  3:02 AM  Result Value Ref Range Status   MRSA by PCR NEGATIVE NEGATIVE Final    Comment:        The GeneXpert MRSA Assay (FDA approved for NASAL specimens only), is one component of a comprehensive MRSA colonization surveillance program. It is not intended to diagnose MRSA infection nor to guide or monitor treatment for MRSA infections.     RADIOLOGY:  Dg Elbow Complete Right  Result Date: 12/04/2016 CLINICAL DATA:  Fall, elbow pain EXAM: RIGHT ELBOW - COMPLETE 3+ VIEW COMPARISON:  None. FINDINGS: Plate and screw fixation device noted in the proximal ulna related to remote injury. No acute fracture, subluxation or dislocation. No joint effusion. IMPRESSION: No acute bony abnormality. Electronically Signed   By: Charlett Nose M.D.   On: 12/04/2016 20:27   Ct Head Wo Contrast  Result Date: 12/04/2016 CLINICAL DATA:  Recurrent falls over the past few days. EXAM: CT HEAD WITHOUT CONTRAST CT CERVICAL SPINE WITHOUT CONTRAST TECHNIQUE: Multidetector CT imaging of the head and cervical spine was performed following the standard protocol without intravenous contrast. Multiplanar CT image reconstructions of the cervical spine were also generated. COMPARISON:  Head CT scan 11/26/2016.  Brain MRI 06/01/2016. FINDINGS: CT HEAD FINDINGS Brain: No hemorrhage, infarct, mass lesion, mass effect, midline shift or abnormal extra-axial fluid collection. No hydrocephalus or pneumocephalus. Vascular: Negative. Skull: Intact. Sinuses/Orbits: Mucous retention cyst or polyp right maxillary sinus is incompletely visualized. Other: None. CT CERVICAL SPINE FINDINGS Alignment: Normal. Skull base and vertebrae: No acute fracture. No primary bone lesion or focal pathologic process. Soft tissues and spinal canal: No prevertebral fluid or swelling. No visible canal hematoma. Disc levels:  Intervertebral disc space height is maintained at all levels. Upper chest: Clear. Other: None. IMPRESSION: No acute abnormality head or cervical spine. Mucous retention cyst or polyp right maxillary sinus noted. Electronically Signed   By: Drusilla Kanner M.D.   On: 12/04/2016 21:42   Ct Cervical Spine Wo Contrast  Result Date: 12/04/2016 CLINICAL DATA:  Recurrent falls over the past few days. EXAM: CT HEAD WITHOUT CONTRAST CT CERVICAL SPINE WITHOUT CONTRAST TECHNIQUE: Multidetector CT imaging of the head and cervical spine was performed following the standard protocol without intravenous contrast. Multiplanar CT image reconstructions of the cervical spine were also generated. COMPARISON:  Head CT scan 11/26/2016.  Brain MRI 06/01/2016. FINDINGS: CT HEAD FINDINGS Brain: No hemorrhage, infarct, mass lesion, mass effect, midline shift or abnormal extra-axial fluid collection. No hydrocephalus or pneumocephalus. Vascular: Negative. Skull: Intact. Sinuses/Orbits: Mucous retention cyst or polyp right maxillary sinus is incompletely visualized. Other: None. CT CERVICAL SPINE FINDINGS Alignment: Normal. Skull base and vertebrae: No acute fracture. No primary bone lesion or focal pathologic process. Soft tissues and spinal canal: No prevertebral fluid or swelling. No visible canal hematoma. Disc levels: Intervertebral disc space height is maintained at all levels. Upper chest: Clear. Other: None. IMPRESSION: No acute abnormality head or cervical spine. Mucous retention cyst or polyp right maxillary sinus noted. Electronically Signed   By: Drusilla Kanner M.D.   On: 12/04/2016 21:42      Management plans discussed with the patient, family and they are in agreement.  CODE STATUS:     Code Status Orders        Start     Ordered   12/05/16 0301  Full code  Continuous     12/05/16 0300    Code Status History    Date Active Date Inactive Code Status Order ID Comments User Context  This patient has a  current code status but no historical code status.      TOTAL TIME TAKING CARE OF THIS PATIENT: 40 minutes.    Houston Siren M.D on 12/06/2016 at 9:21 AM  Between 7am to 6pm - Pager - [phone]  After 6pm go to [email or web address] - Social research officer, government  [facility]  Office  [phone]  CC: Primary care physician; Armando Gang, FNP

## 2016-12-06 NOTE — Progress Notes (Signed)
Pt is being discharged today. Discharge instructions given to the patient, he verified understanding. IV x1 removed. [employer] taxi service was called for transportation. Pt refused a wheelchair but was walked out with the volunteer.

## 2016-12-10 ENCOUNTER — Other Ambulatory Visit: Payer: Self-pay | Admitting: Student

## 2016-12-10 DIAGNOSIS — K219 Gastro-esophageal reflux disease without esophagitis: Secondary | ICD-10-CM

## 2016-12-10 DIAGNOSIS — R112 Nausea with vomiting, unspecified: Secondary | ICD-10-CM

## 2016-12-10 DIAGNOSIS — R1012 Left upper quadrant pain: Secondary | ICD-10-CM

## 2016-12-10 DIAGNOSIS — K5909 Other constipation: Secondary | ICD-10-CM

## 2016-12-14 ENCOUNTER — Ambulatory Visit
Admission: RE | Admit: 2016-12-14 | Discharge: 2016-12-14 | Disposition: A | Discharge status: Home / Self Care | Payer: Medicaid Other | Source: Ambulatory Visit | Attending: Student | Admitting: Student

## 2016-12-14 DIAGNOSIS — K5909 Other constipation: Secondary | ICD-10-CM | POA: Insufficient documentation

## 2016-12-14 DIAGNOSIS — K449 Diaphragmatic hernia without obstruction or gangrene: Secondary | ICD-10-CM | POA: Insufficient documentation

## 2016-12-14 DIAGNOSIS — R1012 Left upper quadrant pain: Secondary | ICD-10-CM | POA: Diagnosis not present

## 2016-12-14 DIAGNOSIS — K219 Gastro-esophageal reflux disease without esophagitis: Secondary | ICD-10-CM | POA: Insufficient documentation

## 2016-12-14 DIAGNOSIS — R112 Nausea with vomiting, unspecified: Secondary | ICD-10-CM | POA: Diagnosis present

## 2016-12-22 ENCOUNTER — Encounter: Payer: Self-pay | Admitting: Emergency Medicine

## 2016-12-22 DIAGNOSIS — Z79899 Other long term (current) drug therapy: Secondary | ICD-10-CM | POA: Insufficient documentation

## 2016-12-22 DIAGNOSIS — F17210 Nicotine dependence, cigarettes, uncomplicated: Secondary | ICD-10-CM | POA: Diagnosis not present

## 2016-12-22 DIAGNOSIS — R112 Nausea with vomiting, unspecified: Secondary | ICD-10-CM | POA: Insufficient documentation

## 2016-12-22 LAB — COMPREHENSIVE METABOLIC PANEL
ALBUMIN: 4 g/dL (ref 3.5–5.0)
ALK PHOS: 91 U/L (ref 38–126)
ALT: 13 U/L — ABNORMAL LOW (ref 17–63)
ANION GAP: 12 (ref 5–15)
AST: 19 U/L (ref 15–41)
BILIRUBIN TOTAL: 0.7 mg/dL (ref 0.3–1.2)
BUN: 8 mg/dL (ref 6–20)
CALCIUM: 9.4 mg/dL (ref 8.9–10.3)
CO2: 25 mmol/L (ref 22–32)
Chloride: 101 mmol/L (ref 101–111)
Creatinine, Ser: 0.97 mg/dL (ref 0.61–1.24)
GFR calc Af Amer: >60 mL/min (ref >60)
GLUCOSE: 102 mg/dL — AB (ref 65–99)
POTASSIUM: 3.3 mmol/L — AB (ref 3.5–5.1)
Sodium: 138 mmol/L (ref 135–145)
TOTAL PROTEIN: 7.2 g/dL (ref 6.5–8.1)

## 2016-12-22 LAB — URINALYSIS, COMPLETE (UACMP) WITH MICROSCOPIC
BILIRUBIN URINE: NEGATIVE
GLUCOSE, UA: NEGATIVE mg/dL
HGB URINE DIPSTICK: NEGATIVE
Ketones, ur: 5 mg/dL — AB
LEUKOCYTES UA: NEGATIVE
NITRITE: NEGATIVE
Protein, ur: 30 mg/dL — AB
SPECIFIC GRAVITY, URINE: 1.016 (ref 1.005–1.030)
pH: 6 (ref 5.0–8.0)

## 2016-12-22 LAB — CBC
HEMATOCRIT: 42.9 % (ref 40.0–52.0)
HEMOGLOBIN: 15.2 g/dL (ref 13.0–18.0)
MCH: 30.8 pg (ref 26.0–34.0)
MCHC: 35.5 g/dL (ref 32.0–36.0)
MCV: 86.7 fL (ref 80.0–100.0)
Platelets: 284 10*3/uL (ref 150–440)
RBC: 4.94 MIL/uL (ref 4.40–5.90)
RDW: 14.6 % — AB (ref 11.5–14.5)
WBC: 7.7 10*3/uL (ref 3.8–10.6)

## 2016-12-22 LAB — LIPASE, BLOOD: Lipase: 36 U/L (ref 11–51)

## 2016-12-22 MED ORDER — ONDANSETRON 4 MG PO TBDP
4.0000 mg | ORAL_TABLET | Freq: Once | ORAL | Status: AC | PRN
  Administered 2016-12-22: 4 mg via ORAL
  Filled 2016-12-22: qty 1

## 2016-12-22 NOTE — ED Notes (Signed)
Patient sitting in wheelchair in NAD at this time.

## 2016-12-22 NOTE — ED Notes (Addendum)
EMS pt from group home with c/o N/V; staff has not witnessed pt vomiting but did hear him retching; pt has vomited since receiving night time medications but unsure if he kept those down; pt was diagnosed with hernia 2 weeks ago

## 2016-12-22 NOTE — ED Triage Notes (Signed)
Patient brought in by ems from [facility] group home. Patient was diagnosed with a hernia about 2 weeks ago. Patient with complaint of vomiting that started this morning. Patient denies abdominal pain just states that it is uncomfortable when he vomits.

## 2016-12-23 ENCOUNTER — Emergency Department
Admission: EM | Admit: 2016-12-23 | Discharge: 2016-12-23 | Disposition: A | Discharge status: Home / Self Care | Payer: Medicaid Other | Attending: Emergency Medicine | Admitting: Emergency Medicine

## 2016-12-23 DIAGNOSIS — R112 Nausea with vomiting, unspecified: Secondary | ICD-10-CM

## 2016-12-23 MED ORDER — ONDANSETRON 4 MG PO TBDP: 4.0000 mg | ORAL_TABLET | Freq: Three times a day (TID) | ORAL | 0 refills | Status: DC | PRN

## 2016-12-23 MED ORDER — SODIUM CHLORIDE 0.9 % IV BOLUS (SEPSIS): 1000.0000 mL | Freq: Once | INTRAVENOUS | Status: DC

## 2016-12-23 NOTE — Discharge Instructions (Signed)
1. You may take nausea medicine as needed (Zofran #20). 2. Clear liquids 12 hours, then bland diet 2 days, then slowly advance diet as tolerated. 3. Return to the ER for worsening symptoms, persistent vomiting, difficulty breathing or other concerns.

## 2016-12-23 NOTE — ED Provider Notes (Signed)
[facility] Emergency Department Provider Note   ____________________________________________   First MD Initiated Contact with Patient 12/23/16 0133     (approximate)  I have reviewed the triage vital signs and the nursing notes.   HISTORY  Chief Complaint Emesis    HPI Russell Buchanan is a 41 y.o. male brought to the ED from group home via EMS with a chief complaint of vomiting.Patient had recent UGI by his gastroenterologist which demonstrated a small hiatal hernia. Has chronic issues with constipation and vomiting. He was placed on Compazine, Carafate, Protonix and Linzess. States he "drank a lot of water" today and began to vomit intermittently throughout the day. Denies associated fever, chills, chest pain, shortness of breath, abdominal pain. Reports diarrhea this morning. Denies recent travel or trauma. Nothing makes his symptoms better or worse.   Past Medical History:  Diagnosis Date  . Antisocial personality disorder   . GERD (gastroesophageal reflux disease)   . Hyperlipidemia   . Schizophrenia [facility])     Patient Active Problem List   Diagnosis Date Noted  . Acute encephalopathy 12/04/2016  . GERD (gastroesophageal reflux disease) 12/04/2016  . HLD (hyperlipidemia) 12/04/2016  . Schizophrenia (HCC) 11/15/2016  . Medication side effects 11/15/2016    Past Surgical History:  Procedure Laterality Date  . ORIF ELBOW FRACTURE Right   . WRIST RECONSTRUCTION Left     Prior to Admission medications   Medication Sig Start Date End Date Taking? Authorizing Provider  albuterol (PROVENTIL HFA;VENTOLIN HFA) 108 (90 Base) MCG/ACT inhaler Inhale 1-2 puffs into the lungs every 4 (four) hours as needed for wheezing or shortness of breath.    [provider]  benztropine (COGENTIN) 1 MG tablet Take 1 mg by mouth 2 (two) times daily as needed for tremors.     [provider]  buPROPion (WELLBUTRIN XL) 150 MG 24 hr tablet Take 150  mg by mouth daily.    [provider]  Butalbital-APAP-Caffeine 50-325-40 MG per capsule Take 1 capsule by mouth 2 (two) times daily as needed for headache.     [provider]  cetirizine (ZYRTEC) 10 MG tablet Take 10 mg by mouth daily.    [provider]  clotrimazole-betamethasone (LOTRISONE) cream Apply 1 application topically 3 (three) times daily.    [provider]  divalproex (DEPAKOTE ER) 500 MG 24 hr tablet Take 500-1,000 mg by mouth 2 (two) times daily. Take 500 mg oral by mouth in the morning and take 1000 mg by mouth at bedtime.    [provider]  docusate sodium (COLACE) 100 MG capsule Take 100 mg by mouth daily.    [provider]  EPINEPHrine 0.3 mg/0.3 mL IJ SOAJ injection Inject 0.3 mg into the muscle as needed (allergic reactions).     [provider]  escitalopram (LEXAPRO) 20 MG tablet Take 20 mg by mouth daily.    [provider]  gemfibrozil (LOPID) 600 MG tablet Take 600 mg by mouth 2 (two) times daily before a meal.    [provider]  haloperidol (HALDOL) 10 MG tablet Take 10 mg by mouth 2 (two) times daily as needed (agitation).     [provider]  ketoconazole (NIZORAL) 2 % cream Apply 1 application topically 2 (two) times daily.    [provider]  lipase/protease/amylase (CREON) 36000 UNITS CPEP capsule Take 36,000 Units by mouth 3 (three) times daily between meals as needed (with snacks).     [provider]  lipase/protease/amylase (CREON) 36000 UNITS CPEP capsule Take 72,000 Units by mouth 3 (three) times daily with meals.    [provider]  LORazepam (ATIVAN) 1 MG tablet Take 1 mg by mouth every 8 (eight) hours as needed for anxiety.    [provider]  magnesium gluconate (MAGONATE) 500 MG tablet Take 500 mg by mouth daily.    [provider]  nystatin cream (MYCOSTATIN) Apply 1 application topically 2 (two) times daily as needed  for dry skin.     [provider]  ondansetron (ZOFRAN) 8 MG tablet Take 8 mg by mouth every 8 (eight) hours as needed for nausea or vomiting.     [provider]  Paliperidone Palmitate (INVEGA TRINZA) 819 MG/2.625ML SUSP Inject 1 Dose into the muscle every 3 (three) months.     [provider]  pantoprazole (PROTONIX) 40 MG tablet Take 40 mg by mouth 2 (two) times daily.     [provider]  propranolol (INDERAL) 20 MG tablet Take 20 mg by mouth every 8 (eight) hours as needed (TREMORS).     [provider]  propranolol (INNOPRAN XL) 80 MG 24 hr capsule Take 80 mg by mouth daily.    [provider]  QUEtiapine (SEROQUEL XR) 400 MG 24 hr tablet Take 800 mg by mouth at bedtime.    [provider]  risperidone (RISPERDAL) 4 MG tablet Take 2 mg by mouth 2 (two) times daily as needed (severe agitation).     [provider]  SUMAtriptan (IMITREX) 100 MG tablet Take 100 mg by mouth every 2 (two) hours as needed for migraine. May repeat in 2 hours if headache persists or recurs.    [provider]  tiotropium (SPIRIVA) 18 MCG inhalation capsule Place 18 mcg into inhaler and inhale daily.    [provider]    Allergies Bee venom  Family History  Problem Relation Age of Onset  . Family history unknown: Yes    Social History Social History  Substance Use Topics  . Smoking status: Current Every Day Smoker    Packs/day: 1.00    Types: Cigarettes  . Smokeless tobacco: Never Used  . Alcohol use No    Review of Systems  Constitutional: No fever/chills. Eyes: No visual changes. ENT: No sore throat. Cardiovascular: Denies chest pain. Respiratory: Denies shortness of breath. Gastrointestinal: No abdominal pain.  Positive for nausea and vomiting.  No diarrhea.  No constipation. Genitourinary: Negative for dysuria. Musculoskeletal: Negative for back pain. Skin: Negative for rash. Neurological: Negative  for headaches, focal weakness or numbness.   ____________________________________________   PHYSICAL EXAM:  VITAL SIGNS: ED Triage Vitals  Enc Vitals Group     BP 12/22/16 2054 (!) 119/94     Pulse Rate 12/22/16 2054 90     Resp 12/22/16 2054 18     Temp 12/22/16 2054 98.7 F (37.1 C)     Temp Source 12/22/16 2054 Oral     SpO2 12/22/16 2054 100 %     Weight 12/22/16 2055 225 lb (102.1 kg)     Height 12/22/16 2055 5\' 11"  (1.803 m)     Head Circumference --      Peak Flow --      Pain Score --      Pain Loc --      Pain Edu? --      Excl. in GC? --     Constitutional: Alert and oriented. Well appearing and in no acute distress.  Eyes: Conjunctivae are normal. PERRL. EOMI. Head: Atraumatic. Nose: No congestion/rhinnorhea. Mouth/Throat: Mucous membranes are moist.  Oropharynx non-erythematous. Neck: No stridor.   Cardiovascular: Normal rate, regular rhythm. Grossly normal heart sounds.  Good peripheral circulation. Respiratory: Normal respiratory effort.  No retractions. Lungs CTAB. Gastrointestinal: Soft and nontender to light or deep palpation. No distention. No abdominal bruits. No CVA tenderness. Musculoskeletal: No lower extremity tenderness nor edema.  No joint effusions. Neurologic:  Normal speech and language. No gross focal neurologic deficits are appreciated. No gait instability. Skin:  Skin is warm, dry and intact. No rash noted. Psychiatric: Mood and affect are flat. Speech and behavior are normal.  ____________________________________________   LABS (all labs ordered are listed, but only abnormal results are displayed)  Labs Reviewed  COMPREHENSIVE METABOLIC PANEL - Abnormal; Notable for the following:       Result Value   Potassium 3.3 (*)    Glucose, Bld 102 (*)    ALT 13 (*)    All other components within normal limits  CBC - Abnormal; Notable for the following:    RDW 14.6 (*)    All other components within normal limits  URINALYSIS, COMPLETE  (UACMP) WITH MICROSCOPIC - Abnormal; Notable for the following:    Color, Urine YELLOW (*)    APPearance CLEAR (*)    Ketones, ur 5 (*)    Protein, ur 30 (*)    Bacteria, UA RARE (*)    Squamous Epithelial / LPF 0-5 (*)    All other components within normal limits  LIPASE, BLOOD   ____________________________________________  EKG  None ____________________________________________  RADIOLOGY  No results found.  ____________________________________________   PROCEDURES  Procedure(s) performed: None  Procedures  Critical Care performed: No  ____________________________________________   INITIAL IMPRESSION / ASSESSMENT AND PLAN / ED COURSE  Pertinent labs & imaging results that were available during my care of the patient were reviewed by me and considered in my medical decision making (see chart for details).  41 year old male brought from group home with a chief complain of vomiting; similar symptoms previously. Laboratory and urinalysis results fairly unremarkable. Patient's nausea is significantly improved after receiving ODT Zofran in triage over 4 hours ago. However, patient is requesting IV fluids. I think this is reasonable given he had slight ketones in his urine. Will infuse IV fluids, PO challenge and reassess.  Clinical Course as of Dec 24 806  Sun Dec 23, 2016  0213 Patient tolerated ice chips without emesis. Actually does not desire IV fluids now. Will discharge with prescription for Zofran ODT. Strict return precautions given. Patient verbalizes understanding and agrees with plan of care.  [JS]    Clinical Course User Index [JS] [name], MD     ____________________________________________   FINAL CLINICAL IMPRESSION(S) / ED DIAGNOSES  Final diagnoses:  Non-intractable vomiting with nausea, unspecified vomiting type      NEW MEDICATIONS STARTED DURING THIS VISIT:  New Prescriptions   No medications on file     Note:  This document  was prepared using Dragon voice recognition software and may include unintentional dictation errors.    [name], MD 12/23/16 [phone]

## 2016-12-28 ENCOUNTER — Encounter: Payer: Self-pay | Admitting: Emergency Medicine

## 2016-12-28 ENCOUNTER — Emergency Department: Payer: Medicaid Other

## 2016-12-28 ENCOUNTER — Emergency Department
Admission: EM | Admit: 2016-12-28 | Discharge: 2016-12-29 | Disposition: A | Discharge status: Home / Self Care | Payer: Medicaid Other | Attending: Emergency Medicine | Admitting: Emergency Medicine

## 2016-12-28 DIAGNOSIS — W19XXXA Unspecified fall, initial encounter: Secondary | ICD-10-CM

## 2016-12-28 DIAGNOSIS — R251 Tremor, unspecified: Secondary | ICD-10-CM

## 2016-12-28 NOTE — ED Notes (Signed)
Pt st he is not here for abd pain, he is here to determine the cause of his leg pain resulting in ongoing falls. RN notified

## 2016-12-28 NOTE — ED Triage Notes (Signed)
Pt states that he is taking Seroquel and that his Truix? is reacting with it. Pt states that he fell last night and hurt his right leg. Pt denies any LOC with fall. Pt is here for medication clarification in order to prevent future falls. Pt is ambulatory to triage with NAD at this time. Pt comes from [facility] and states that he has no legal guardian. Group home contacted [facility] who stated that we can call [name] for pt to transport back at time of DC. BN number is [phone]. Pt does not have legal guardian.

## 2016-12-29 NOTE — ED Provider Notes (Signed)
[facility] Emergency Department Provider Note   First MD Initiated Contact with Patient 12/29/16 0002     (approximate)  I have reviewed the triage vital signs and the nursing notes.   HISTORY  Chief Complaint Medication Reaction    HPI Russell Buchanan is a 41 y.o. male with blows chronic medical conditions presents to the emergency department "requesting that I change his medications". Patient states that the "medicine for my tremors making me tremor more.   Past Medical History:  Diagnosis Date  . Antisocial personality disorder   . GERD (gastroesophageal reflux disease)   . Hyperlipidemia   . Schizophrenia [facility])     Patient Active Problem List   Diagnosis Date Noted  . Acute encephalopathy 12/04/2016  . GERD (gastroesophageal reflux disease) 12/04/2016  . HLD (hyperlipidemia) 12/04/2016  . Schizophrenia (HCC) 11/15/2016  . Medication side effects 11/15/2016    Past Surgical History:  Procedure Laterality Date  . ORIF ELBOW FRACTURE Right   . WRIST RECONSTRUCTION Left     Prior to Admission medications   Medication Sig Start Date End Date Taking? Authorizing Provider  albuterol (PROVENTIL HFA;VENTOLIN HFA) 108 (90 Base) MCG/ACT inhaler Inhale 1-2 puffs into the lungs every 4 (four) hours as needed for wheezing or shortness of breath.    [provider]  benztropine (COGENTIN) 1 MG tablet Take 1 mg by mouth 2 (two) times daily as needed for tremors.     [provider]  buPROPion (WELLBUTRIN XL) 150 MG 24 hr tablet Take 150 mg by mouth daily.    [provider]  Butalbital-APAP-Caffeine 50-325-40 MG per capsule Take 1 capsule by mouth 2 (two) times daily as needed for headache.     [provider]  cetirizine (ZYRTEC) 10 MG tablet Take 10 mg by mouth daily.    [provider]  clotrimazole-betamethasone (LOTRISONE) cream Apply 1 application topically 3 (three) times daily.    [provider]  divalproex (DEPAKOTE ER) 500 MG 24 hr tablet Take 500-1,000 mg by mouth 2 (two) times daily. Take 500 mg oral by mouth in the morning and take 1000 mg by mouth at bedtime.    [provider]  docusate sodium (COLACE) 100 MG capsule Take 100 mg by mouth daily.    [provider]  EPINEPHrine 0.3 mg/0.3 mL IJ SOAJ injection Inject 0.3 mg into the muscle as needed (allergic reactions).     [provider]  escitalopram (LEXAPRO) 20 MG tablet Take 20 mg by mouth daily.    [provider]  gemfibrozil (LOPID) 600 MG tablet Take 600 mg by mouth 2 (two) times daily before a meal.    [provider]  haloperidol (HALDOL) 10 MG tablet Take 10 mg by mouth 2 (two) times daily as needed (agitation).     [provider]  ketoconazole (NIZORAL) 2 % cream Apply 1 application topically 2 (two) times daily.    [provider]  lipase/protease/amylase (CREON) 36000 UNITS CPEP capsule Take 36,000 Units by mouth 3 (three) times daily between meals as needed (with snacks).     [provider]  lipase/protease/amylase (CREON) 36000 UNITS CPEP capsule Take 72,000 Units by mouth 3 (three) times daily with meals.    [provider]  LORazepam (ATIVAN) 1 MG tablet Take 1 mg by mouth every 8 (eight) hours as needed for anxiety.    [provider]  magnesium gluconate (MAGONATE) 500 MG tablet Take 500 mg by mouth  daily.    [provider]  nystatin cream (MYCOSTATIN) Apply 1 application topically 2 (two) times daily as needed for dry skin.     [provider]  ondansetron (ZOFRAN ODT) 4 MG disintegrating tablet Take 1 tablet (4 mg total) by mouth every 8 (eight) hours as needed for nausea or vomiting. 12/23/16   [name], MD  ondansetron (ZOFRAN) 8 MG tablet Take 8 mg by mouth every 8 (eight) hours as needed for nausea or vomiting.     [provider]  Paliperidone Palmitate (INVEGA TRINZA) 819  MG/2.625ML SUSP Inject 1 Dose into the muscle every 3 (three) months.     [provider]  pantoprazole (PROTONIX) 40 MG tablet Take 40 mg by mouth 2 (two) times daily.     [provider]  propranolol (INDERAL) 20 MG tablet Take 20 mg by mouth every 8 (eight) hours as needed (TREMORS).     [provider]  propranolol (INNOPRAN XL) 80 MG 24 hr capsule Take 80 mg by mouth daily.    [provider]  QUEtiapine (SEROQUEL XR) 400 MG 24 hr tablet Take 800 mg by mouth at bedtime.    [provider]  risperidone (RISPERDAL) 4 MG tablet Take 2 mg by mouth 2 (two) times daily as needed (severe agitation).     [provider]  SUMAtriptan (IMITREX) 100 MG tablet Take 100 mg by mouth every 2 (two) hours as needed for migraine. May repeat in 2 hours if headache persists or recurs.    [provider]  tiotropium (SPIRIVA) 18 MCG inhalation capsule Place 18 mcg into inhaler and inhale daily.    [provider]    Allergies Bee venom  Family History  Problem Relation Age of Onset  . Family history unknown: Yes    Social History Social History  Substance Use Topics  . Smoking status: Current Every Day Smoker    Packs/day: 1.00    Types: Cigarettes  . Smokeless tobacco: Never Used  . Alcohol use No    Review of Systems Constitutional: No fever/chills Eyes: No visual changes. ENT: No sore throat. Cardiovascular: Denies chest pain. Respiratory: Denies shortness of breath. Gastrointestinal: No abdominal pain.  No nausea, no vomiting.  No diarrhea.  No constipation. Genitourinary: Negative for dysuria. Musculoskeletal: Negative for neck pain.  Negative for back pain. Integumentary: Negative for rash. Neurological: Negative for headaches, focal weakness or numbness.   ____________________________________________   PHYSICAL EXAM:  VITAL SIGNS: ED Triage Vitals  Enc Vitals Group     BP 12/28/16 1956 122/87     Pulse  Rate 12/28/16 1956 (!) 113     Resp 12/28/16 1956 16     Temp 12/28/16 1956 98.7 F (37.1 C)     Temp Source 12/28/16 1956 Oral     SpO2 12/28/16 1956 96 %     Weight 12/28/16 1956 101.6 kg (224 lb)     Height 12/28/16 1956 1.803 m (5\' 11" )     Head Circumference --      Peak Flow --      Pain Score 12/28/16 2009 0     Pain Loc --      Pain Edu? --      Excl. in GC? --     Constitutional: Alert and oriented. Well appearing and in no acute distress. Eyes: Conjunctivae are normal.  Head: Atraumatic. Mouth/Throat: Mucous membranes are moist Neck: No stridor.  Cardiovascular: Normal rate, regular rhythm. Good peripheral  circulation. Grossly normal heart sounds. Respiratory: Normal respiratory effort.  No retractions. Lungs CTAB. Gastrointestinal: Soft and nontender. No distention.  Musculoskeletal: No lower extremity tenderness nor edema. No gross deformities of extremities. Neurologic:  Normal speech and language. No gross focal neurologic deficits are appreciated.  Skin:  Skin is warm, dry and intact. No rash noted. Psychiatric: Mood and affect are normal. Speech and behavior are normal.  ___  RADIOLOGY I, Kenwood N BROWN, personally viewed and evaluated these images (plain radiographs) as part of my medical decision making, as well as reviewing the written report by the radiologist.  Dg Humerus Right  Result Date: 12/28/2016 CLINICAL DATA:  Fall EXAM: RIGHT HUMERUS - 2+ VIEW COMPARISON:  12/04/2016 FINDINGS: No fracture or dislocation. Surgical plate and screw fixation of the proximal ulna. Soft tissues are unremarkable. IMPRESSION: No acute osseous abnormality Electronically Signed   By: Jasmine Pang M.D.   On: 12/28/2016 20:47     Procedures   ____________________________________________   INITIAL IMPRESSION / ASSESSMENT AND PLAN / ED COURSE  Pertinent labs & imaging results that were available during my care of the patient were reviewed by me and considered in my  medical decision making (see chart for details).  Spoke with Mr. Wadding at length regarding the fact that it would be inappropriate for me to change his medications and a such requested that the patient follow up with his primary care provider for medication management.      ____________________________________________  FINAL CLINICAL IMPRESSION(S) / ED DIAGNOSES  Final diagnoses:  Fall  Tremors of nervous system     MEDICATIONS GIVEN DURING THIS VISIT:  Medications - No data to display   NEW OUTPATIENT MEDICATIONS STARTED DURING THIS VISIT:  New Prescriptions   No medications on file    Modified Medications   No medications on file    Discontinued Medications   No medications on file     Note:  This document was prepared using Dragon voice recognition software and may include unintentional dictation errors.    Darci Current, MD 12/29/16 [phone]

## 2017-01-03 ENCOUNTER — Emergency Department: Payer: Medicaid Other

## 2017-01-03 ENCOUNTER — Emergency Department
Admission: EM | Admit: 2017-01-03 | Discharge: 2017-01-03 | Disposition: A | Discharge status: Home / Self Care | Payer: Medicaid Other | Attending: Emergency Medicine | Admitting: Emergency Medicine

## 2017-01-03 ENCOUNTER — Encounter: Payer: Self-pay | Admitting: Emergency Medicine

## 2017-01-03 DIAGNOSIS — Y998 Other external cause status: Secondary | ICD-10-CM | POA: Insufficient documentation

## 2017-01-03 DIAGNOSIS — W010XXA Fall on same level from slipping, tripping and stumbling without subsequent striking against object, initial encounter: Secondary | ICD-10-CM | POA: Diagnosis not present

## 2017-01-03 DIAGNOSIS — Z79899 Other long term (current) drug therapy: Secondary | ICD-10-CM | POA: Diagnosis not present

## 2017-01-03 DIAGNOSIS — Y9301 Activity, walking, marching and hiking: Secondary | ICD-10-CM | POA: Diagnosis not present

## 2017-01-03 DIAGNOSIS — S5001XA Contusion of right elbow, initial encounter: Secondary | ICD-10-CM | POA: Diagnosis not present

## 2017-01-03 DIAGNOSIS — M25521 Pain in right elbow: Secondary | ICD-10-CM | POA: Diagnosis present

## 2017-01-03 DIAGNOSIS — F17210 Nicotine dependence, cigarettes, uncomplicated: Secondary | ICD-10-CM | POA: Insufficient documentation

## 2017-01-03 DIAGNOSIS — Y929 Unspecified place or not applicable: Secondary | ICD-10-CM | POA: Insufficient documentation

## 2017-01-03 LAB — GLUCOSE, CAPILLARY: GLUCOSE-CAPILLARY: 118 mg/dL — AB (ref 65–99)

## 2017-01-03 MED ORDER — BUTALBITAL-APAP-CAFFEINE 50-325-40 MG PO TABS
1.0000 | ORAL_TABLET | ORAL | Status: AC
  Administered 2017-01-03: 1 via ORAL
  Filled 2017-01-03: qty 1

## 2017-01-03 NOTE — ED Notes (Signed)
Pt placed in [place]. Pt given a walker to help decrease falls and verbalized he has a wheelchair at home he intends to use. Pt is cab with RN assistance. PT did not fall and MD was aware of pts reports of tremor in left arm and right leg. When encouraged pt able to stand and ambulate.

## 2017-01-03 NOTE — Discharge Instructions (Signed)

## 2017-01-03 NOTE — ED Triage Notes (Signed)
Pt arrived via EMS from [facility] for reports of frequent falls, right elbow pain and refusing to take his propranolol which pt refers to as his "tremor medication". Pt states the reason he does not want to take it is because it makes him tremor more. EMS reports 140/72, 102HR, CBG 103.

## 2017-01-03 NOTE — ED Provider Notes (Signed)
[facility] Emergency Department Provider Note   ____________________________________________   First MD Initiated Contact with Patient 01/03/17 1650     (approximate)  I have reviewed the triage vital signs and the nursing notes.   HISTORY  Chief Complaint Fall and Elbow Pain    HPI Russell Buchanan is a 41 y.o. male reports that today he fell while walking. He fell onto his right elbow and it is sore and swollen. He reports that he falls regularly, does not use a cane or walker. He said several falls over the last several months. He also reports that he has had tremors in both hands do been ongoinssue for months.  Reports his right elbow feels sore. Denies any weakness numbness or tingling in the hand. Denies any injury to the shoulder. Denies any other injury. Did not strike his head. No headache. Does tell me he has a history of migraines, but has not had any recently.  Reports she has not any fevers or chills. He does tell me he was in the ER a few days ago because he thinks his tremor medicine actually makes his tremors worse, so he is not taking it regularly.   Past Medical History:  Diagnosis Date  . Antisocial personality disorder   . GERD (gastroesophageal reflux disease)   . Hyperlipidemia   . Schizophrenia [facility])     Patient Active Problem List   Diagnosis Date Noted  . Acute encephalopathy 12/04/2016  . GERD (gastroesophageal reflux disease) 12/04/2016  . HLD (hyperlipidemia) 12/04/2016  . Schizophrenia (HCC) 11/15/2016  . Medication side effects 11/15/2016    Past Surgical History:  Procedure Laterality Date  . ORIF ELBOW FRACTURE Right   . WRIST RECONSTRUCTION Left     Prior to Admission medications   Medication Sig Start Date End Date Taking? Authorizing Provider  albuterol (PROVENTIL HFA;VENTOLIN HFA) 108 (90 Base) MCG/ACT inhaler Inhale 1-2 puffs into the lungs every 4 (four) hours as needed for wheezing or shortness of  breath.    [provider]  benztropine (COGENTIN) 1 MG tablet Take 1 mg by mouth 2 (two) times daily as needed for tremors.     [provider]  buPROPion (WELLBUTRIN XL) 150 MG 24 hr tablet Take 150 mg by mouth daily.    [provider]  Butalbital-APAP-Caffeine 50-325-40 MG per capsule Take 1 capsule by mouth 2 (two) times daily as needed for headache.     [provider]  cetirizine (ZYRTEC) 10 MG tablet Take 10 mg by mouth daily.    [provider]  clotrimazole-betamethasone (LOTRISONE) cream Apply 1 application topically 3 (three) times daily.    [provider]  divalproex (DEPAKOTE ER) 500 MG 24 hr tablet Take 500-1,000 mg by mouth 2 (two) times daily. Take 500 mg oral by mouth in the morning and take 1000 mg by mouth at bedtime.    [provider]  docusate sodium (COLACE) 100 MG capsule Take 100 mg by mouth daily.    [provider]  EPINEPHrine 0.3 mg/0.3 mL IJ SOAJ injection Inject 0.3 mg into the muscle as needed (allergic reactions).     [provider]  escitalopram (LEXAPRO) 20 MG tablet Take 20 mg by mouth daily.    [provider]  gemfibrozil (LOPID) 600 MG tablet Take 600 mg by mouth 2 (two) times daily before a meal.    [provider]  haloperidol (HALDOL) 10 MG tablet Take 10 mg by mouth 2 (two)  times daily as needed (agitation).     [provider]  ketoconazole (NIZORAL) 2 % cream Apply 1 application topically 2 (two) times daily.    [provider]  lipase/protease/amylase (CREON) 36000 UNITS CPEP capsule Take 36,000 Units by mouth 3 (three) times daily between meals as needed (with snacks).     [provider]  lipase/protease/amylase (CREON) 36000 UNITS CPEP capsule Take 72,000 Units by mouth 3 (three) times daily with meals.    [provider]  LORazepam (ATIVAN) 1 MG tablet Take 1 mg by mouth every 8 (eight) hours as needed for anxiety.     [provider]  magnesium gluconate (MAGONATE) 500 MG tablet Take 500 mg by mouth daily.    [provider]  nystatin cream (MYCOSTATIN) Apply 1 application topically 2 (two) times daily as needed for dry skin.     [provider]  ondansetron (ZOFRAN ODT) 4 MG disintegrating tablet Take 1 tablet (4 mg total) by mouth every 8 (eight) hours as needed for nausea or vomiting. 12/23/16   [name], MD  ondansetron (ZOFRAN) 8 MG tablet Take 8 mg by mouth every 8 (eight) hours as needed for nausea or vomiting.     [provider]  Paliperidone Palmitate (INVEGA TRINZA) 819 MG/2.625ML SUSP Inject 1 Dose into the muscle every 3 (three) months.     [provider]  pantoprazole (PROTONIX) 40 MG tablet Take 40 mg by mouth 2 (two) times daily.     [provider]  propranolol (INDERAL) 20 MG tablet Take 20 mg by mouth every 8 (eight) hours as needed (TREMORS).     [provider]  propranolol (INNOPRAN XL) 80 MG 24 hr capsule Take 80 mg by mouth daily.    [provider]  QUEtiapine (SEROQUEL XR) 400 MG 24 hr tablet Take 800 mg by mouth at bedtime.    [provider]  risperidone (RISPERDAL) 4 MG tablet Take 2 mg by mouth 2 (two) times daily as needed (severe agitation).     [provider]  SUMAtriptan (IMITREX) 100 MG tablet Take 100 mg by mouth every 2 (two) hours as needed for migraine. May repeat in 2 hours if headache persists or recurs.    [provider]  tiotropium (SPIRIVA) 18 MCG inhalation capsule Place 18 mcg into inhaler and inhale daily.    [provider]    Allergies Bee venom  Family History  Problem Relation Age of Onset  . Family history unknown: Yes    Social History Social History  Substance Use Topics  . Smoking status: Current Every Day Smoker    Packs/day: 1.00    Types: Cigarettes  . Smokeless tobacco: Never Used  . Alcohol use No    Review of  Systems Constitutional: No fever/chills Eyes: No visual changes. Left eye is a "lazy eye" ENT: No sore throat. Cardiovascular: Denies chest pain. Respiratory: Denies shortness of breath. Gastrointestinal: No abdominal pain.  No nausea, no vomiting.  No diarrhea.  No constipation. Genitourinary: Negative for dysuria. Musculoskeletal: Negative for back pain. No neck pain. Skin: Negative for rash. Neurological: Negative for headaches, focal weakness or numbness. Reports tremor in both hands and weakness in both arms and been ongoing for months. No new changes. Reports he falls frequently.    ____________________________________________   PHYSICAL EXAM:  VITAL SIGNS: ED Triage Vitals [01/03/17 1644]  Enc Vitals Group     BP (!) 131/93     Pulse Rate Marland Kitchen)  107     Resp 18     Temp 98.7 F (37.1 C)     Temp Source Oral     SpO2 97 %     Weight 224 lb (101.6 kg)     Height 5\' 11"  (1.803 m)     Head Circumference      Peak Flow      Pain Score 6     Pain Loc      Pain Edu?      Excl. in GC?     Constitutional: Alert and oriented. Well appearing and in no acute distress.Flat affect. Pleasant. No distress. Eyes: Conjunctivae are normal. Head: Atraumatic. Nose: No congestion/rhinnorhea. Mouth/Throat: Mucous membranes are moist. Neck: No stridor.  No cervical tenderness midline. Cardiovascular: Normal rate, regular rhythm. Grossly normal heart sounds.  Good peripheral circulation. Respiratory: Normal respiratory effort.  No retractions. Lungs CTAB. Gastrointestinal: Soft and nontender. No distention. Musculoskeletal: No lower extremity tenderness nor edema. Lower Extremities  No edema. Normal DP/PT pulses bilateral with good cap refill.  Normal neuro-motor function lower extremities bilateral.  RIGHT Right lower extremity demonstrates normal strength, good use of all muscles. No edema bruising or contusions of the right hip, right knee, right ankle. Full range of motion of  the right lower extremity without pain. No pain on axial loading. No evidence of trauma.  LEFT Left lower extremity demonstrates normal strength, good use of all muscles. No edema bruising or contusions of the hip,  knee, ankle. Full range of motion of the left lower extremity without pain. No pain on axial loading. No evidence of trauma.  RIGHT Right upper extremity demonstrates normal strength, good use of all muscles except for some limitations of flexion and extension at the right elbow secondary to pain. No edema bruising or contusions of the right shoulder/upper arm, right forearm / hand. The patient has an old scab that is clean dry and intact with some swelling and edema located over the area of the olecranon. Reports he falls frequently and has skin the elbow several times in the past. No bleeding or open wound at this time. No open fractures. No obvious deformity but some swelling is apparent over the olecranon. Strong radial pulse. Intact median/ulnar/radial neuro-muscular exam.  LEFT Left upper extremity demonstrates normal strength, good use of all muscles. No edema bruising or contusions of the left shoulder/upper arm, left elbow, left forearm / hand. Full range of motion of the left  upper extremity without pain. No evidence of trauma. Strong radial pulse. Intact median/ulnar/radial neuro-muscular exam.   Neurologic:  Normal speech and language. No gross focal neurologic deficits are appreciated.  Skin:  Skin is warm, dry and intact. No rash noted. Psychiatric: Mood and affect are normal. Speech and behavior are normal.  ____________________________________________   LABS (all labs ordered are listed, but only abnormal results are displayed)  Labs Reviewed  CBG MONITORING, ED   ____________________________________________  EKG   ____________________________________________  RADIOLOGY  Dg Elbow Complete Right  Result Date: 01/03/2017 CLINICAL DATA:  41 year old male  with right elbow pain. History of prior elbow fracture and surgical repair EXAM: RIGHT ELBOW - COMPLETE 3+ VIEW COMPARISON:  Radiographs of the right humerus 12/28/2016 FINDINGS: No evidence of acute fracture, malalignment or elbow joint effusion. Healed remote ulnar olecranon fracture status postoperative fixation with a dorsal plate and screw construct. One of the more proximal screws is fractured. Residual deformity of the ulnar head and neck likely represents sequelae from remote healed fracture.  Mild secondary degenerative change with osteophyte formation at the olecranon process of the ulna. Mild soft tissue swelling overlying the dorsal plate consistent with contusion. Correlation is made with the prior radiographs of the humerus from 12/28/2016. I cannot definitively tell if the screw was fractured at that time. IMPRESSION: 1. Soft tissue contusion overlying the dorsal the olecranon fixation plate. No evidence of acute fracture. 2. Age indeterminate hardware fracture involving the third most proximal screw. After comparing with the radiographs of the right humerus from 12/28/2016, I cannot tell if the screw was fractured at that time due to limited visualization of the hardware. Electronically Signed   By: Malachy Moan M.D.   On: 01/03/2017 17:38    ____________________________________________   PROCEDURES  Procedure(s) performed: None  Procedures  Critical Care performed: No  ____________________________________________   INITIAL IMPRESSION / ASSESSMENT AND PLAN / ED COURSE  Pertinent labs & imaging results that were available during my care of the patient were reviewed by me and considered in my medical decision making (see chart for details).  Patient reports in his normal health which includes a slight general weakness and frequent falls and tremor. There is a very minimal tremor at this time both of her extremities. Demonstrates normal strength, but does have tenderness and  swelling over the right elbow. Does not appear to be any other evidence of injury. Head is atraumatic and denies any head injury or neck injury.  Denies Injuries. Reports in normal state of health otherwise.  We'll obtain x-rays of the right elbow, reports he generally uses that he is "headache medicine" for any other pain and it works well. We'll give fioricet.    ----------------------------------------- 6:15 PM on 01/03/2017 -----------------------------------------  Patient resting comfortably. Reports his pain is much better after a single Fioricet. He is in no distress. Discussed and reviewed x-ray findings including possible hardware fracture with Dr. Joice Lofts who recommends patient follow-up in clinic with orthopedics. Dr. Joice Lofts reviewed history and x-rays with me.   Return precautions and treatment recommendations and follow-up discussed with the patient who is agreeable with the plan.  ____________________________________________   FINAL CLINICAL IMPRESSION(S) / ED DIAGNOSES  Final diagnoses:  Contusion of right elbow, initial encounter      NEW MEDICATIONS STARTED DURING THIS VISIT:  New Prescriptions   No medications on file     Note:  This document was prepared using Dragon voice recognition software and may include unintentional dictation errors.     Sharyn Creamer, MD 01/03/17 [id number]

## 2017-01-03 NOTE — ED Notes (Signed)
RN called Russell Buchanan at care home. Update given, pt updated by MD. Pts paperwork from facilty and discharge papers placed in an envelope. Address written on outside of envelope. [name] called per [employer] recommendation and Driver made aware of destination. Pt in NAD at this time.

## 2017-01-03 NOTE — ED Notes (Signed)
RN called [name] who stated they will be on the way. Pt has all belongings in hand. Pt in lobby. NAD. Pt taken to lobby via a wheelchair.

## 2017-01-04 NOTE — ED Notes (Signed)
Pt reports pain in right elbow. Noted swelling and decreased movement. Pt also guarding arm from contact when staff is close. PT reports head pain and is having trouble holding eyes open at ths time. Pt is able to answer all questions appropriately other than when pt is asked why he is unable to hold arms up pt stated he did not know. Upon further questioning pt has been evaluated by neuro and has no answer for intermittent loss of strength and twitching. Pt will be twitching when staff is asking about arms and then when staff is not at bedside pt will move arm with ease and scratch face.

## 2017-01-05 ENCOUNTER — Encounter: Payer: Self-pay | Admitting: Emergency Medicine

## 2017-01-05 ENCOUNTER — Emergency Department
Admission: EM | Admit: 2017-01-05 | Discharge: 2017-01-07 | Disposition: A | Discharge status: Home / Self Care | Payer: Medicaid Other | Attending: Emergency Medicine | Admitting: Emergency Medicine

## 2017-01-05 DIAGNOSIS — R338 Other retention of urine: Secondary | ICD-10-CM

## 2017-01-05 DIAGNOSIS — F329 Major depressive disorder, single episode, unspecified: Secondary | ICD-10-CM | POA: Diagnosis not present

## 2017-01-05 DIAGNOSIS — Z79899 Other long term (current) drug therapy: Secondary | ICD-10-CM | POA: Diagnosis not present

## 2017-01-05 DIAGNOSIS — F17210 Nicotine dependence, cigarettes, uncomplicated: Secondary | ICD-10-CM | POA: Diagnosis not present

## 2017-01-05 DIAGNOSIS — R531 Weakness: Secondary | ICD-10-CM | POA: Diagnosis present

## 2017-01-05 DIAGNOSIS — F32A Depression, unspecified: Secondary | ICD-10-CM

## 2017-01-05 DIAGNOSIS — F203 Undifferentiated schizophrenia: Secondary | ICD-10-CM | POA: Diagnosis not present

## 2017-01-05 DIAGNOSIS — R339 Retention of urine, unspecified: Secondary | ICD-10-CM | POA: Diagnosis not present

## 2017-01-05 DIAGNOSIS — F209 Schizophrenia, unspecified: Secondary | ICD-10-CM | POA: Diagnosis present

## 2017-01-05 LAB — BASIC METABOLIC PANEL
Anion gap: 12 (ref 5–15)
BUN: 19 mg/dL (ref 6–20)
CHLORIDE: 104 mmol/L (ref 101–111)
CO2: 24 mmol/L (ref 22–32)
CREATININE: 1.01 mg/dL (ref 0.61–1.24)
Calcium: 9.3 mg/dL (ref 8.9–10.3)
GFR calc Af Amer: >60 mL/min (ref >60)
GFR calc non Af Amer: >60 mL/min (ref >60)
GLUCOSE: 91 mg/dL (ref 65–99)
POTASSIUM: 3.6 mmol/L (ref 3.5–5.1)
SODIUM: 140 mmol/L (ref 135–145)

## 2017-01-05 LAB — URINALYSIS, COMPLETE (UACMP) WITH MICROSCOPIC
BILIRUBIN URINE: NEGATIVE
Glucose, UA: NEGATIVE mg/dL
HGB URINE DIPSTICK: NEGATIVE
KETONES UR: 5 mg/dL — AB
LEUKOCYTES UA: NEGATIVE
Nitrite: NEGATIVE
PROTEIN: 100 mg/dL — AB
SPECIFIC GRAVITY, URINE: 1.025 (ref 1.005–1.030)
pH: 5 (ref 5.0–8.0)

## 2017-01-05 LAB — CBC WITH DIFFERENTIAL/PLATELET
Basophils Absolute: 0.1 10*3/uL (ref 0–0.1)
Basophils Relative: 1 %
EOS ABS: 0.1 10*3/uL (ref 0–0.7)
EOS PCT: 1 %
HCT: 39.8 % — ABNORMAL LOW (ref 40.0–52.0)
HEMOGLOBIN: 14 g/dL (ref 13.0–18.0)
LYMPHS ABS: 3 10*3/uL (ref 1.0–3.6)
LYMPHS PCT: 23 %
MCH: 31 pg (ref 26.0–34.0)
MCHC: 35.1 g/dL (ref 32.0–36.0)
MCV: 88.2 fL (ref 80.0–100.0)
MONOS PCT: 13 %
Monocytes Absolute: 1.7 10*3/uL — ABNORMAL HIGH (ref 0.2–1.0)
NEUTROS PCT: 62 %
Neutro Abs: 8.1 10*3/uL — ABNORMAL HIGH (ref 1.4–6.5)
Platelets: 294 10*3/uL (ref 150–440)
RBC: 4.51 MIL/uL (ref 4.40–5.90)
RDW: 14.2 % (ref 11.5–14.5)
WBC: 13 10*3/uL — ABNORMAL HIGH (ref 3.8–10.6)

## 2017-01-05 LAB — LACTIC ACID, PLASMA: LACTIC ACID, VENOUS: 1.4 mmol/L (ref 0.5–1.9)

## 2017-01-05 MED ORDER — SODIUM CHLORIDE 0.9 % IV BOLUS (SEPSIS)
1000.0000 mL | Freq: Once | INTRAVENOUS | Status: AC
  Administered 2017-01-05: 1000 mL via INTRAVENOUS

## 2017-01-05 NOTE — ED Notes (Signed)
Pt. Had change to pych. medication

## 2017-01-05 NOTE — ED Notes (Signed)
Used bladder scan on patient, machine read 822 ml.

## 2017-01-05 NOTE — ED Notes (Signed)
Pt. Is from [facility].  Staff report pt. Has not been eating or drinking.  Staff report pt. Has been using a lot of tobacco products today.  Pt. Reports he ate pizza and had been drinking soda today.  Pt. Has healing sore to rt. Elbow, pt. Has some discomfort when rt. Elbow is moved.  No deformity noted.  Pt. Has urge to urinate, but has been unable to urinate.

## 2017-01-05 NOTE — ED Notes (Signed)
Russell Buchanan [phone] Group home manager and asked about pt. Medication.  Doristine Counter stated she believed pt. Was on too many pych medications.  Doristine Counter also stated as of 7/23 of this year pt. Does not has a Therapist, sports.

## 2017-01-05 NOTE — ED Notes (Signed)
Called Bertha(Group home Production designer, theatre/television/film) at pt. Group home, she stated pt. Has had multiple falls and near falls in the past 2 days.  She states pt. Has not been at [employer] for the past 2 days.

## 2017-01-05 NOTE — ED Provider Notes (Signed)
[facility] Emergency Department Provider Note   ____________________________________________   I have reviewed the triage vital signs and the nursing notes.   HISTORY  Chief Complaint Urinary Tract Infection and Fatigue   History limited by: Poor historian   HPI Russell Buchanan is a 41 y.o. male who presents to the emergency department today because of concerns for weakness. Patient is coming from a group home. Apparently over the past couple days he has been more weak. He has had decrease in appetite. Apparently they do have some concerns for urinary tract infection. Additionally they're concerned that his right elbow might be infected from a fall recently. The patient himself denies any pain. He is not sure why he is here.  Past Medical History:  Diagnosis Date  . Antisocial personality disorder   . GERD (gastroesophageal reflux disease)   . Hyperlipidemia   . Schizophrenia [facility])     Patient Active Problem List   Diagnosis Date Noted  . Acute encephalopathy 12/04/2016  . GERD (gastroesophageal reflux disease) 12/04/2016  . HLD (hyperlipidemia) 12/04/2016  . Schizophrenia (HCC) 11/15/2016  . Medication side effects 11/15/2016    Past Surgical History:  Procedure Laterality Date  . ORIF ELBOW FRACTURE Right   . WRIST RECONSTRUCTION Left     Prior to Admission medications   Medication Sig Start Date End Date Taking? Authorizing Provider  albuterol (PROVENTIL HFA;VENTOLIN HFA) 108 (90 Base) MCG/ACT inhaler Inhale 1-2 puffs into the lungs every 4 (four) hours as needed for wheezing or shortness of breath.    [provider]  benztropine (COGENTIN) 1 MG tablet Take 1 mg by mouth 2 (two) times daily as needed for tremors.     [provider]  buPROPion (WELLBUTRIN XL) 150 MG 24 hr tablet Take 150 mg by mouth daily.    [provider]  Butalbital-APAP-Caffeine 50-325-40 MG per capsule Take 1 capsule by mouth 2 (two) times  daily as needed for headache.     [provider]  cetirizine (ZYRTEC) 10 MG tablet Take 10 mg by mouth daily.    [provider]  clotrimazole-betamethasone (LOTRISONE) cream Apply 1 application topically 3 (three) times daily.    [provider]  divalproex (DEPAKOTE ER) 500 MG 24 hr tablet Take 500-1,000 mg by mouth 2 (two) times daily. Take 500 mg oral by mouth in the morning and take 1000 mg by mouth at bedtime.    [provider]  docusate sodium (COLACE) 100 MG capsule Take 100 mg by mouth daily.    [provider]  EPINEPHrine 0.3 mg/0.3 mL IJ SOAJ injection Inject 0.3 mg into the muscle as needed (allergic reactions).     [provider]  escitalopram (LEXAPRO) 20 MG tablet Take 20 mg by mouth daily.    [provider]  gemfibrozil (LOPID) 600 MG tablet Take 600 mg by mouth 2 (two) times daily before a meal.    [provider]  haloperidol (HALDOL) 10 MG tablet Take 10 mg by mouth 2 (two) times daily as needed (agitation).     [provider]  ketoconazole (NIZORAL) 2 % cream Apply 1 application topically 2 (two) times daily.    [provider]  lipase/protease/amylase (CREON) 36000 UNITS CPEP capsule Take 36,000 Units by mouth 3 (three) times daily between meals as needed (with snacks).     [provider]  lipase/protease/amylase (CREON) 36000 UNITS CPEP capsule Take 72,000 Units by mouth 3 (three) times daily with meals.  [provider]  LORazepam (ATIVAN) 1 MG tablet Take 1 mg by mouth every 8 (eight) hours as needed for anxiety.    [provider]  magnesium gluconate (MAGONATE) 500 MG tablet Take 500 mg by mouth daily.    [provider]  nystatin cream (MYCOSTATIN) Apply 1 application topically 2 (two) times daily as needed for dry skin.     [provider]  ondansetron (ZOFRAN ODT) 4 MG disintegrating tablet Take 1 tablet (4 mg total) by mouth every  8 (eight) hours as needed for nausea or vomiting. 12/23/16   [name], MD  ondansetron (ZOFRAN) 8 MG tablet Take 8 mg by mouth every 8 (eight) hours as needed for nausea or vomiting.     [provider]  Paliperidone Palmitate (INVEGA TRINZA) 819 MG/2.625ML SUSP Inject 1 Dose into the muscle every 3 (three) months.     [provider]  pantoprazole (PROTONIX) 40 MG tablet Take 40 mg by mouth 2 (two) times daily.     [provider]  propranolol (INDERAL) 20 MG tablet Take 20 mg by mouth every 8 (eight) hours as needed (TREMORS).     [provider]  propranolol (INNOPRAN XL) 80 MG 24 hr capsule Take 80 mg by mouth daily.    [provider]  QUEtiapine (SEROQUEL XR) 400 MG 24 hr tablet Take 800 mg by mouth at bedtime.    [provider]  risperidone (RISPERDAL) 4 MG tablet Take 2 mg by mouth 2 (two) times daily as needed (severe agitation).     [provider]  SUMAtriptan (IMITREX) 100 MG tablet Take 100 mg by mouth every 2 (two) hours as needed for migraine. May repeat in 2 hours if headache persists or recurs.    [provider]  tiotropium (SPIRIVA) 18 MCG inhalation capsule Place 18 mcg into inhaler and inhale daily.    [provider]    Allergies Bee venom  Family History  Problem Relation Age of Onset  . Family history unknown: Yes    Social History Social History  Substance Use Topics  . Smoking status: Current Every Day Smoker    Packs/day: 1.00    Types: Cigarettes  . Smokeless tobacco: Current User    Types: Chew, Snuff  . Alcohol use No    Review of Systems Constitutional: No fever/chills Eyes: No visual changes. ENT: No sore throat. Cardiovascular: Denies chest pain. Respiratory: Denies shortness of breath. Gastrointestinal: No abdominal pain.  No nausea, no vomiting.  No diarrhea.   Genitourinary: Negative for dysuria. Musculoskeletal: Negative for back pain. Skin: Negative for  rash. Neurological: Negative for headaches, focal weakness or numbness.  ____________________________________________   PHYSICAL EXAM:  VITAL SIGNS: ED Triage Vitals  Enc Vitals Group     BP 01/05/17 1536 109/79     Pulse Rate 01/05/17 1536 74     Resp 01/05/17 1536 17     Temp 01/05/17 1536 (!) 97.2 F (36.2 C)     Temp Source 01/05/17 1536 Oral     SpO2 01/05/17 1536 97 %     Weight 01/05/17 1531 229 lb (103.9 kg)     Height 01/05/17 1531 5\' 10"  (1.778 m)   Constitutional: Awake and alert. Not completely oriented. Well appearing and in no distress. Eyes: Conjunctivae are normal.  ENT   Head: Normocephalic and atraumatic.   Nose: No congestion/rhinnorhea.   Mouth/Throat: Mucous membranes are moist.   Neck: No stridor. Hematological/Lymphatic/Immunilogical: No cervical lymphadenopathy.  Cardiovascular: Normal rate, regular rhythm.  No murmurs, rubs, or gallops.  Respiratory: Normal respiratory effort without tachypnea nor retractions. Breath sounds are clear and equal bilaterally. No wheezes/rales/rhonchi. Gastrointestinal: Soft and non tender. No rebound. No guarding.  Genitourinary: Deferred Musculoskeletal: Normal range of motion in all extremities. No lower extremity edema. Neurologic:  Normal speech and language. No gross focal neurologic deficits are appreciated.  Skin:  Skin is warm, dry and intact. No rash noted. Psychiatric: Flat affect.  ____________________________________________    LABS (pertinent positives/negatives)  Labs Reviewed  CBC WITH DIFFERENTIAL/PLATELET - Abnormal; Notable for the following:       Result Value   WBC 13.0 (*)    HCT 39.8 (*)    Neutro Abs 8.1 (*)    Monocytes Absolute 1.7 (*)    All other components within normal limits  URINALYSIS, COMPLETE (UACMP) WITH MICROSCOPIC - Abnormal; Notable for the following:    Color, Urine AMBER (*)    APPearance CLEAR (*)    Ketones, ur 5 (*)    Protein, ur 100 (*)    Bacteria,  UA RARE (*)    Squamous Epithelial / LPF 0-5 (*)    All other components within normal limits  BASIC METABOLIC PANEL  LACTIC ACID, PLASMA  LACTIC ACID, PLASMA     ____________________________________________   EKG  None  ____________________________________________    RADIOLOGY  None  ____________________________________________   PROCEDURES  Procedures  ____________________________________________   INITIAL IMPRESSION / ASSESSMENT AND PLAN / ED COURSE  Pertinent labs & imaging results that were available during my care of the patient were reviewed by me and considered in my medical decision making (see chart for details).  Patient presents from a psychiatric group home today because of concerns for weakness possible urinary tract infection. No signs of urinary tract infection on exam. Patient does have a flat affect here. Is not very talkative. It does sound like this is decreased from his baseline. At this point wonder if it could be secondary psychiatric illness or medications. Will have specialist on-call evaluate.  ____________________________________________   FINAL CLINICAL IMPRESSION(S) / ED DIAGNOSES  Final diagnoses:  None     Note: This dictation was prepared with Dragon dictation. Any transcriptional errors that result from this process are unintentional     Phineas Semen, MD 01/05/17 2315

## 2017-01-05 NOTE — ED Triage Notes (Signed)
EMS report pt. Is from [facility] Group home with complaints of possible UTI, infection to rt. Elbow and dehydration.

## 2017-01-05 NOTE — ED Notes (Signed)
Pt. Expressed he was thirsty, pt. Given juice and water upon request.

## 2017-01-05 NOTE — ED Notes (Signed)
Pt. Helped to feet to try to urinate, pt unable to urinate at this time.

## 2017-01-06 LAB — VALPROIC ACID LEVEL: Valproic Acid Lvl: 64 ug/mL (ref 50.0–100.0)

## 2017-01-06 MED ORDER — PANTOPRAZOLE SODIUM 40 MG PO TBEC
40.0000 mg | DELAYED_RELEASE_TABLET | Freq: Two times a day (BID) | ORAL | Status: DC
  Administered 2017-01-06 – 2017-01-07 (×2): 40 mg via ORAL
  Filled 2017-01-06 (×2): qty 1

## 2017-01-06 MED ORDER — TIOTROPIUM BROMIDE MONOHYDRATE 18 MCG IN CAPS
18.0000 ug | ORAL_CAPSULE | Freq: Every day | RESPIRATORY_TRACT | Status: DC
  Administered 2017-01-06 – 2017-01-07 (×2): 18 ug via RESPIRATORY_TRACT
  Filled 2017-01-06: qty 5

## 2017-01-06 MED ORDER — PROPRANOLOL HCL ER BEADS 80 MG PO CP24
80.0000 mg | ORAL_CAPSULE | Freq: Every day | ORAL | Status: DC
  Administered 2017-01-06 – 2017-01-07 (×2): 80 mg via ORAL
  Filled 2017-01-06 (×2): qty 1

## 2017-01-06 MED ORDER — PROPRANOLOL HCL 20 MG PO TABS
ORAL_TABLET | ORAL | Status: AC
  Filled 2017-01-06: qty 4

## 2017-01-06 NOTE — ED Notes (Signed)
BEHAVIORAL HEALTH ROUNDING Patient sleeping: Yes.   Patient alert and oriented: not applicable SLEEPING Behavior appropriate: Yes.  ; If no, describe: SLEEPING Nutrition and fluids offered: No SLEEPING Toileting and hygiene offered: NoSLEEPING Sitter present: not applicable, Q 15 min safety rounds and observation. Law enforcement present: Yes ODS 

## 2017-01-06 NOTE — ED Notes (Signed)
Pt. Given meal tray and grape drink.

## 2017-01-06 NOTE — ED Notes (Signed)
Pt requesting foley catheter be removed, MD informed.

## 2017-01-06 NOTE — ED Notes (Signed)
Talked to [facility] and gave report.

## 2017-01-06 NOTE — ED Notes (Signed)
Patient stated that he wanted to take a shower today but not right now. I asked him to let me know when he felt like showering.

## 2017-01-06 NOTE — ED Notes (Signed)
Patient was able to urinate.  Patient caught approx. 64ml in the urinal and also was incontinent of urine.  Patient was assisted in changing clothes and bedding.  Patient is now clean and dry.  Patient resting quietly at this time.

## 2017-01-06 NOTE — ED Provider Notes (Addendum)
-----------------------------------------   1:54 AM on 01/06/2017 -----------------------------------------   Blood pressure 96/69, pulse 63, temperature (!) 97.2 F (36.2 C), temperature source Oral, resp. rate 14, height 5\' 10"  (1.778 m), weight 103.9 kg (229 lb), SpO2 97 %.  The patient had no acute events since last update.  Calm and cooperative at this time.  Specialist on call is recommending inpatient admission.   Merrily Brittle, MD 01/06/17 0154   Apparently earlier in the day the patient had a bladder scan if 822 ml and was unable to urinate on his own so Dr. Derrill Kay had a Foley catheter placed. The patient had acute urinary retention likely secondary to a change in his psychiatric medications recently. He'll require his Foley catheter in place for 1 week prior to a trial of void.   Merrily Brittle, MD 01/06/17 [phone]

## 2017-01-06 NOTE — ED Notes (Signed)
The patient is eating his breakfast at this time.

## 2017-01-06 NOTE — ED Notes (Signed)
65ml in bladder per bladder scan

## 2017-01-06 NOTE — ED Notes (Signed)
BEHAVIORAL HEALTH ROUNDING Patient sleeping: No. Patient alert and oriented: yes Behavior appropriate: Yes.  ; If no, describe:  Nutrition and fluids offered: Yes  Toileting and hygiene offered: Yes  Sitter present: q 15 min checks Law enforcement present: Yes  

## 2017-01-06 NOTE — ED Notes (Signed)
Pt. Finished SOC.  SOC recommends inpatient.

## 2017-01-06 NOTE — ED Notes (Signed)
BEHAVIORAL HEALTH ROUNDING  Patient sleeping: No.  Patient alert and oriented: yes  Behavior appropriate: Yes. ; If no, describe:  Nutrition and fluids offered: Yes  Toileting and hygiene offered: Yes  Sitter present: not applicable, Q 15 min safety rounds and observation.  Law enforcement present: Yes ODS  

## 2017-01-06 NOTE — ED Provider Notes (Signed)
-----------------------------------------   9:20 PM on 01/06/2017 -----------------------------------------   Blood pressure 132/78, pulse 76, temperature 98.3 F (36.8 C), temperature source Oral, resp. rate 18, height 5\' 10"  (1.778 m), weight 103.9 kg (229 lb), SpO2 97 %.  The patient had no acute events since last update.  Calm and cooperative at this time.  Patient voided with a post void residual of 20 cc. This is status post Foley removal.     Schaevitz, Myra Rude, MD 01/06/17 2120

## 2017-01-06 NOTE — ED Notes (Signed)
BEHAVIORAL HEALTH ROUNDING Patient sleeping: No. Patient alert and oriented: yes Behavior appropriate: Yes.  ; If no, describe:  Nutrition and fluids offered: Yes  Toileting and hygiene offered: Yes  Sitter present: q 15 min check Law enforcement present: Yes

## 2017-01-06 NOTE — ED Notes (Signed)
Foley cath removed without difficulty, patient informed he needs to urinate by 3pm today and allow staff to witness.
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
## 2017-01-06 NOTE — ED Notes (Signed)
Patient given grape juice and spilled it on himself.  Patient assisted in changing his clothing.

## 2017-01-06 NOTE — ED Notes (Signed)

## 2017-01-06 NOTE — ED Notes (Signed)
Pt claimed maroon scrubs and incontinence brief were wet/soiled. Pt cleaned up and new behavioral scrubs/socks/underwear provided.

## 2017-01-06 NOTE — ED Notes (Addendum)
During Q15 min checks pt was at the end of his bed; this tech ask pt where he was going pt stated "I was going to pee"advised pt that he needed to stay in bed for his safety, pt got back in bed with very minimal assistance; also advised that he needed to use the urinal that is on the  bed rail; placed a bed alarm on pt, when explained to pt what I did pt laughed

## 2017-01-06 NOTE — ED Provider Notes (Signed)
-----------------------------------------   10:13 AM on 01/06/2017 -----------------------------------------  Patient requesting his Foley be removed which we did do. He does understand however that we may have to put it back in if he is unable to urinate. I did try to call person listed as his responsible emergency contact. There is a message from someone named "Marcelino Duster" at the number given to [place]. They did not yet called back. The name however on the emergency contact is Miss Ray, but that number does not seem to be concordant with the name on the answering machine. In any event no other guardian information is available to me at this time we will continue to monitor the patient.   Jeanmarie Plant, MD 01/06/17 [phone]

## 2017-01-07 DIAGNOSIS — F203 Undifferentiated schizophrenia: Secondary | ICD-10-CM | POA: Diagnosis not present

## 2017-01-07 NOTE — ED Provider Notes (Signed)
-----------------------------------------   6:26 AM on 01/07/2017 -----------------------------------------   Blood pressure 132/78, pulse 76, temperature 98.3 F (36.8 C), temperature source Oral, resp. rate 18, height 5\' 10"  (1.778 m), weight 103.9 kg (229 lb), SpO2 97 %.  The patient had no acute events since last update.  Calm and cooperative at this time.  The patient meets inpatient criteria according to the specialist on-call and is awaiting a bed at an outside facility.     Rebecka Apley, MD 01/07/17 [phone]

## 2017-01-07 NOTE — ED Notes (Signed)
Pt given breakfast tray and is eating.

## 2017-01-07 NOTE — ED Notes (Signed)
BEHAVIORAL HEALTH ROUNDING Patient sleeping: Yes.   Patient alert and oriented: not applicable SLEEPING Behavior appropriate: Yes.  ; If no, describe: SLEEPING Nutrition and fluids offered: No SLEEPING Toileting and hygiene offered: NoSLEEPING Sitter present: not applicable, Q 15 min safety rounds and observation. Law enforcement present: Yes ODS 

## 2017-01-07 NOTE — ED Notes (Signed)
Referral information for Psychiatric Hospitalization faxed to;      [facility] [phone] or [phone]   [facility] [phone]),    [address] [phone]),    [facility] [phone]),    [facility] [phone]),    [facility] [phone]).   [facility] [phone])

## 2017-01-07 NOTE — ED Notes (Signed)
Pt assisted to stand and urinate by [place], EDT. Pt voided approx of clear, dark yellow urine and assisted back to bed with no difficulty.

## 2017-01-07 NOTE — ED Notes (Signed)
Clothes at nursing desk. Explained delay to pt. Will dress when cab here

## 2017-01-07 NOTE — Consult Note (Signed)
[facility] Face-to-Face Psychiatry Consult   Reason for Consult:  Consult for 41 year old man with a history of schizophrenia who came to the emergency room before the weekend with complaints about his elbow Referring Physician:  Paduchowski Patient Identification: Russell Buchanan MRN:  [id number] Principal Diagnosis: Schizophrenia [facility]) Diagnosis:   Patient Active Problem List   Diagnosis Date Noted  . Acute encephalopathy [G93.40] 12/04/2016  . GERD (gastroesophageal reflux disease) [K21.9] 12/04/2016  . HLD (hyperlipidemia) [E78.5] 12/04/2016  . Schizophrenia (HCC) [F20.9] 11/15/2016  . Medication side effects [T88.7XXA] 11/15/2016    Total Time spent with patient: 1 hour  Subjective:   Russell Buchanan is a 41 y.o. male patient admitted with "I just came here because of my elbow".  HPI:  Patient interviewed chart reviewed. Patient is known to the psychiatric service and has a chronic problem with psychotic disorder. Back on Friday he came to the emergency room with medical complaints. For reasons that are still unclear to me he was eventually referred to see the tele-psychiatrist. Patient is denying to me ever making any suicidal statements. He says his mood is been pretty good recently. He sleeps okay at night. He denies that he's been having any hallucinations. He says he is compliant with his medicine. Patient has not shown any dangerous behavior here in the emergency room. Nevertheless the specialist on call recommended inpatient treatment for him. Reevaluation was requested today because of a lack of clarity about why he would need to be in the hospital.  Social history: Patient lives in a group home. Says he gets along there fine. Has no complaints.  Medical history: He has some chronic skin conditions which she keeps under pretty good control. He has some pain in his right elbow from where he broke it some time ago which was his chief complaint here.  Substance abuse history: Denies any  alcohol or drug abuse patient has a history of schizophrenia and has been seen by the psychiatric service in the past. There've been times in the past where he did talk about suicide him. No history of violence. Currently stable on his medicine although he is in between actual providers his group home is working to find him a new one.  Past Psychiatric History: See note above.  Risk to Self: Suicidal Ideation: No Suicidal Intent: No Is patient at risk for suicide?: No Suicidal Plan?: No Access to Means: No What has been your use of drugs/alcohol within the last 12 months?: Pt denies Other Self Harm Risks: None reported Triggers for Past Attempts: Other (Comment) (None reported) Intentional Self Injurious Behavior: None Risk to Others: Homicidal Ideation: No Thoughts of Harm to Others: No Current Homicidal Intent: No Current Homicidal Plan: No Access to Homicidal Means: No Identified Victim: N/A History of harm to others?: No Assessment of Violence: None Noted Violent Behavior Description: N/A Does patient have access to weapons?: No Criminal Charges Pending?: No Does patient have a court date: No Prior Inpatient Therapy: Prior Inpatient Therapy:  (Unknown) Prior Therapy Dates:  (Unknown) Prior Therapy Facilty/Provider(s):  [facility]) Reason for Treatment:  (Behavioral) Prior Outpatient Therapy: Prior Outpatient Therapy:  (Unknown) Prior Therapy Dates:  (Unknown) Prior Therapy Facilty/Provider(s):  (Unknown) Reason for Treatment:  (Unknown) Does patient have an ACCT team?: Unknown Does patient have [facility] services? : Unknown Does patient have [facility] services?: Unknown  Past Medical History:  Past Medical History:  Diagnosis Date  . Antisocial personality disorder   . GERD (gastroesophageal reflux disease)   . Hyperlipidemia   .  Schizophrenia [facility])     Past Surgical History:  Procedure Laterality Date  . ORIF ELBOW FRACTURE Right   . WRIST RECONSTRUCTION Left    Family  History:  Family History  Problem Relation Age of Onset  . Family history unknown: Yes   Family Psychiatric  History: None Social History:  History  Alcohol Use No     History  Drug Use No    Social History   Social History  . Marital status: Single    Spouse name: N/A  . Number of children: N/A  . Years of education: N/A   Social History Main Topics  . Smoking status: Current Every Day Smoker    Packs/day: 1.00    Types: Cigarettes  . Smokeless tobacco: Current User    Types: Chew, Snuff  . Alcohol use No  . Drug use: No  . Sexual activity: Not on file   Other Topics Concern  . Not on file   Social History Narrative  . No narrative on file   Additional Social History:    Allergies:   Allergies  Allergen Reactions  . Bee Venom Hives    Labs:  Results for orders placed or performed during the hospital encounter of 01/05/17 (from the past 48 hour(s))  Valproic acid level     Status: None   Collection Time: 01/06/17 10:50 AM  Result Value Ref Range   Valproic Acid Lvl 64 50.0 - 100.0 ug/mL    Current Facility-Administered Medications  Medication Dose Route Frequency Provider Last Rate Last Dose  . pantoprazole (PROTONIX) EC tablet 40 mg  40 mg Oral BID Myrna Blazer, MD   40 mg at 01/07/17 0901  . propranolol (INNOPRAN XL) 24 hr capsule 80 mg  80 mg Oral Daily Myrna Blazer, MD   80 mg at 01/07/17 0916  . tiotropium (SPIRIVA) inhalation capsule 18 mcg  18 mcg Inhalation Daily Myrna Blazer, MD   18 mcg at 01/07/17 0900   Current Outpatient Prescriptions  Medication Sig Dispense Refill  . benztropine (COGENTIN) 1 MG tablet Take 1 mg by mouth 2 (two) times daily as needed for tremors.     Marland Kitchen buPROPion (WELLBUTRIN XL) 150 MG 24 hr tablet Take 150 mg by mouth daily.    . Butalbital-APAP-Caffeine 50-325-40 MG per capsule Take 1 capsule by mouth 2 (two) times daily as needed for headache.     . cetirizine (ZYRTEC) 10 MG tablet  Take 10 mg by mouth daily.    . clotrimazole-betamethasone (LOTRISONE) cream Apply 1 application topically 3 (three) times daily.    . divalproex (DEPAKOTE ER) 500 MG 24 hr tablet Take 500-1,000 mg by mouth 2 (two) times daily. Take 500 mg oral by mouth in the morning and take 1000 mg by mouth at bedtime.    . docusate sodium (COLACE) 100 MG capsule Take 100 mg by mouth daily.    Marland Kitchen EPINEPHrine 0.3 mg/0.3 mL IJ SOAJ injection Inject 0.3 mg into the muscle as needed (allergic reactions).     Marland Kitchen escitalopram (LEXAPRO) 20 MG tablet Take 20 mg by mouth daily.    Marland Kitchen gemfibrozil (LOPID) 600 MG tablet Take 600 mg by mouth 2 (two) times daily before a meal.    . haloperidol (HALDOL) 10 MG tablet Take 10 mg by mouth 2 (two) times daily as needed (agitation).     Marland Kitchen ketoconazole (NIZORAL) 2 % cream Apply 1 application topically 2 (two) times daily.    Marland Kitchen linaclotide Karlene Einstein)  72 MCG capsule Take 72 mcg by mouth every other day.    Marland Kitchen LORazepam (ATIVAN) 1 MG tablet Take 1 mg by mouth every 8 (eight) hours as needed for anxiety.    . magnesium gluconate (MAGONATE) 500 MG tablet Take 500 mg by mouth daily.    Marland Kitchen nystatin cream (MYCOSTATIN) Apply 1 application topically 2 (two) times daily as needed for dry skin.     . pantoprazole (PROTONIX) 40 MG tablet Take 40 mg by mouth 2 (two) times daily.     . propranolol (INNOPRAN XL) 80 MG 24 hr capsule Take 80 mg by mouth daily.    . QUEtiapine (SEROQUEL XR) 400 MG 24 hr tablet Take 800 mg by mouth at bedtime.    . sucralfate (CARAFATE) 1 g tablet Take 1 g by mouth 4 (four) times daily -  with meals and at bedtime.    Marland Kitchen tiotropium (SPIRIVA) 18 MCG inhalation capsule Place 18 mcg into inhaler and inhale daily.    Marland Kitchen albuterol (PROVENTIL HFA;VENTOLIN HFA) 108 (90 Base) MCG/ACT inhaler Inhale 1-2 puffs into the lungs every 4 (four) hours as needed for wheezing or shortness of breath.    . ondansetron (ZOFRAN ODT) 4 MG disintegrating tablet Take 1 tablet (4 mg total) by mouth  every 8 (eight) hours as needed for nausea or vomiting. (Patient not taking: Reported on 01/05/2017) 20 tablet 0    Musculoskeletal: Strength & Muscle Tone: within normal limits Gait & Station: normal Patient leans: N/A  Psychiatric Specialty Exam: Physical Exam  Nursing note and vitals reviewed. Constitutional: He appears well-developed and well-nourished.  HENT:  Head: Normocephalic and atraumatic.  Eyes: Pupils are equal, round, and reactive to light. Conjunctivae are normal.  Neck: Normal range of motion.  Cardiovascular: Regular rhythm and normal heart sounds.   Respiratory: Effort normal. No respiratory distress.  GI: Soft.  Musculoskeletal: Normal range of motion.  Neurological: He is alert.  Skin: Skin is warm and dry.  Psychiatric: He has a normal mood and affect. His speech is normal and behavior is normal. Judgment and thought content normal. Cognition and memory are normal.    Review of Systems  Constitutional: Negative.   HENT: Negative.   Eyes: Negative.   Respiratory: Negative.   Cardiovascular: Negative.   Gastrointestinal: Negative.   Musculoskeletal: Positive for joint pain.  Skin: Negative.   Neurological: Negative.   Psychiatric/Behavioral: Negative.     Blood pressure 105/79, pulse 81, temperature (!) 97 F (36.1 C), temperature source Oral, resp. rate 16, height 5\' 10"  (1.778 m), weight 103.9 kg (229 lb), SpO2 97 %.Body mass index is 32.86 kg/m.  General Appearance: Casual  Eye Contact:  Good  Speech:  Normal Rate  Volume:  Normal  Mood:  Euthymic  Affect:  Constricted  Thought Process:  Goal Directed  Orientation:  Full (Time, Place, and Person)  Thought Content:  Logical  Suicidal Thoughts:  No  Homicidal Thoughts:  No  Memory:  Immediate;   Good Recent;   Fair Remote;   Good  Judgement:  Fair  Insight:  Fair  Psychomotor Activity:  Normal  Concentration:  Concentration: Good  Recall:  Good  Fund of Knowledge:  Good  Language:  Good   Akathisia:  No  Handed:  Right  AIMS (if indicated):     Assets:  Communication Skills Desire for Improvement Housing Physical Health Resilience Social Support  ADL's:  Intact  Cognition:  WNL  Sleep:  Treatment Plan Summary: Plan 41 year old man with schizophrenia. Currently calm with no psychiatric complaints. Behavior has been calm. Patient does not appear to meet commitment criteria does not show any sign of being a danger to himself or anyone else. Case reviewed with TTS and emergency room physician. Recommend he be discharged back to his group home and continue his current medicines with outpatient treatment  Disposition: Patient does not meet criteria for psychiatric inpatient admission.  Mordecai Rasmussen, MD 01/07/2017 3:55 PM

## 2017-01-07 NOTE — ED Notes (Addendum)
Called and spoke with Vito Berger at [facility] for update about pt returning. She reports they have contract with [facility] to bring pt back to group home and ok for him to come via cab.  Went over discharge papers via phone with her.  Will also go over with pt as he is alert and oriented.

## 2017-01-07 NOTE — ED Notes (Addendum)
Pt placed in cab back to group home [facility]. Verified drop off with cab driver

## 2017-01-07 NOTE — ED Notes (Signed)
Pt given water and informed lunch would be approx 45 mins.

## 2017-01-07 NOTE — ED Notes (Signed)
Pt given lunch tray. Dr. Toni Amend has spoken with the pt.

## 2017-01-07 NOTE — ED Notes (Signed)
Pt provided urine in urinal and pt is walking around room unassisted and stating he is exercising so he doesn't start hurting.

## 2017-01-07 NOTE — ED Notes (Signed)
Cab called to take pt back to group home.  Will have pt remain in room until they are here. No other needs currently.

## 2017-01-07 NOTE — ED Provider Notes (Addendum)
-----------------------------------------   12:24 PM on 01/07/2017 -----------------------------------------  Patient has been seen and evaluated by psychiatry they believe the patient is safe for discharge from a psychiatric standpoint. The patient's medical workup has been largely nonrevealing, besides urinary retention. Foley catheter was removed. One day ago patient has been urinating without difficulty. We will discharge the patient home with outpatient resources.   Minna Antis, MD 01/07/17 1225    Minna Antis, MD 01/07/17 1226    Minna Antis, MD 01/07/17 1230

## 2017-01-07 NOTE — ED Notes (Signed)

## 2017-01-08 MED FILL — Propranolol HCl Cap ER 24HR 80 MG: ORAL | Qty: 1 | Status: AC

## 2017-01-30 ENCOUNTER — Encounter: Payer: Self-pay | Admitting: Emergency Medicine

## 2017-01-30 ENCOUNTER — Emergency Department: Payer: Medicaid Other

## 2017-01-30 ENCOUNTER — Emergency Department
Admission: EM | Admit: 2017-01-30 | Discharge: 2017-01-30 | Disposition: A | Discharge status: Home / Self Care | Payer: Medicaid Other | Attending: Emergency Medicine | Admitting: Emergency Medicine

## 2017-01-30 DIAGNOSIS — Z79899 Other long term (current) drug therapy: Secondary | ICD-10-CM | POA: Diagnosis not present

## 2017-01-30 DIAGNOSIS — F17210 Nicotine dependence, cigarettes, uncomplicated: Secondary | ICD-10-CM | POA: Insufficient documentation

## 2017-01-30 DIAGNOSIS — J40 Bronchitis, not specified as acute or chronic: Secondary | ICD-10-CM | POA: Insufficient documentation

## 2017-01-30 DIAGNOSIS — R4182 Altered mental status, unspecified: Secondary | ICD-10-CM | POA: Diagnosis present

## 2017-01-30 LAB — CBC WITH DIFFERENTIAL/PLATELET
BASOS PCT: 1 %
Basophils Absolute: 0 10*3/uL (ref 0–0.1)
EOS ABS: 0.1 10*3/uL (ref 0–0.7)
EOS PCT: 1 %
HEMATOCRIT: 42.2 % (ref 40.0–52.0)
HEMOGLOBIN: 14.8 g/dL (ref 13.0–18.0)
LYMPHS ABS: 2 10*3/uL (ref 1.0–3.6)
LYMPHS PCT: 50 %
MCH: 31.4 pg (ref 26.0–34.0)
MCHC: 35.1 g/dL (ref 32.0–36.0)
MCV: 89.4 fL (ref 80.0–100.0)
MONO ABS: 0.6 10*3/uL (ref 0.2–1.0)
Monocytes Relative: 15 %
Neutro Abs: 1.3 10*3/uL — ABNORMAL LOW (ref 1.4–6.5)
Neutrophils Relative %: 33 %
Platelets: 264 10*3/uL (ref 150–440)
RBC: 4.72 MIL/uL (ref 4.40–5.90)
RDW: 13.8 % (ref 11.5–14.5)
WBC: 3.9 10*3/uL (ref 3.8–10.6)

## 2017-01-30 LAB — COMPREHENSIVE METABOLIC PANEL
ALBUMIN: 3.6 g/dL (ref 3.5–5.0)
ALK PHOS: 97 U/L (ref 38–126)
ALT: 13 U/L — ABNORMAL LOW (ref 17–63)
ANION GAP: 10 (ref 5–15)
AST: 23 U/L (ref 15–41)
BILIRUBIN TOTAL: 0.5 mg/dL (ref 0.3–1.2)
BUN: 11 mg/dL (ref 6–20)
CALCIUM: 9.1 mg/dL (ref 8.9–10.3)
CO2: 26 mmol/L (ref 22–32)
Chloride: 102 mmol/L (ref 101–111)
Creatinine, Ser: 0.9 mg/dL (ref 0.61–1.24)
GFR calc Af Amer: >60 mL/min (ref >60)
GFR calc non Af Amer: >60 mL/min (ref >60)
Glucose, Bld: 122 mg/dL — ABNORMAL HIGH (ref 65–99)
POTASSIUM: 3.3 mmol/L — AB (ref 3.5–5.1)
Sodium: 138 mmol/L (ref 135–145)
TOTAL PROTEIN: 7.1 g/dL (ref 6.5–8.1)

## 2017-01-30 MED ORDER — ALBUTEROL SULFATE HFA 108 (90 BASE) MCG/ACT IN AERS: 2.0000 | INHALATION_SPRAY | Freq: Four times a day (QID) | RESPIRATORY_TRACT | 0 refills | Status: AC | PRN

## 2017-01-30 MED ORDER — ALBUTEROL SULFATE (2.5 MG/3ML) 0.083% IN NEBU
5.0000 mg | INHALATION_SOLUTION | Freq: Once | RESPIRATORY_TRACT | Status: AC
  Administered 2017-01-30: 5 mg via RESPIRATORY_TRACT
  Filled 2017-01-30: qty 6

## 2017-01-30 MED ORDER — SODIUM CHLORIDE 0.9 % IV BOLUS (SEPSIS)
1000.0000 mL | Freq: Once | INTRAVENOUS | Status: AC
  Administered 2017-01-30: 1000 mL via INTRAVENOUS

## 2017-01-30 NOTE — ED Notes (Signed)
Pt. Here for Altered mental status.  Pt. States he feels sleepy.  When asked pt. States "I am tired"  Pt. Asked if he took anything other then prescribed medications.  Pt. Denies.

## 2017-01-30 NOTE — Discharge Instructions (Signed)
Fortunately today your blood work is normal. Please use your inhaler as needed for your bronchitis and follow-up with your primary care physician for recheck. Return to the emergency department for any concerns.  It was a pleasure to take care of you today, and thank you for coming to our emergency department.  If you have any questions or concerns before leaving please ask the nurse to grab me and I'm more than happy to go through your aftercare instructions again.  If you were prescribed any opioid pain medication today such as Norco, Vicodin, Percocet, morphine, hydrocodone, or oxycodone please make sure you do not drive when you are taking this medication as it can alter your ability to drive safely.  If you have any concerns once you are home that you are not improving or are in fact getting worse before you can make it to your follow-up appointment, please do not hesitate to call 911 and come back for further evaluation.  Merrily Brittle, MD  Results for orders placed or performed during the hospital encounter of 01/30/17  CBC with Differential  Result Value Ref Range   WBC 3.9 3.8 - 10.6 K/uL   RBC 4.72 4.40 - 5.90 MIL/uL   Hemoglobin 14.8 13.0 - 18.0 g/dL   HCT 16.1 09.6 - 04.5 %   MCV 89.4 80.0 - 100.0 fL   MCH 31.4 26.0 - 34.0 pg   MCHC 35.1 32.0 - 36.0 g/dL   RDW 40.9 81.1 - 91.4 %   Platelets 264 150 - 440 K/uL   Neutrophils Relative % 33 %   Neutro Abs 1.3 (L) 1.4 - 6.5 K/uL   Lymphocytes Relative 50 %   Lymphs Abs 2.0 1.0 - 3.6 K/uL   Monocytes Relative 15 %   Monocytes Absolute 0.6 0.2 - 1.0 K/uL   Eosinophils Relative 1 %   Eosinophils Absolute 0.1 0 - 0.7 K/uL   Basophils Relative 1 %   Basophils Absolute 0.0 0 - 0.1 K/uL  Comprehensive metabolic panel  Result Value Ref Range   Sodium 138 135 - 145 mmol/L   Potassium 3.3 (L) 3.5 - 5.1 mmol/L   Chloride 102 101 - 111 mmol/L   CO2 26 22 - 32 mmol/L   Glucose, Bld 122 (H) 65 - 99 mg/dL   BUN 11 6 - 20 mg/dL   Creatinine, Ser 7.82 0.61 - 1.24 mg/dL   Calcium 9.1 8.9 - 95.6 mg/dL   Total Protein 7.1 6.5 - 8.1 g/dL   Albumin 3.6 3.5 - 5.0 g/dL   AST 23 15 - 41 U/L   ALT 13 (L) 17 - 63 U/L   Alkaline Phosphatase 97 38 - 126 U/L   Total Bilirubin 0.5 0.3 - 1.2 mg/dL   GFR calc non Af Amer >60 >60 mL/min   GFR calc Af Amer >60 >60 mL/min   Anion gap 10 5 - 15   Dg Elbow Complete Right  Result Date: 01/03/2017 CLINICAL DATA:  41 year old male with right elbow pain. History of prior elbow fracture and surgical repair EXAM: RIGHT ELBOW - COMPLETE 3+ VIEW COMPARISON:  Radiographs of the right humerus 12/28/2016 FINDINGS: No evidence of acute fracture, malalignment or elbow joint effusion. Healed remote ulnar olecranon fracture status postoperative fixation with a dorsal plate and screw construct. One of the more proximal screws is fractured. Residual deformity of the ulnar head and neck likely represents sequelae from remote healed fracture. Mild secondary degenerative change with osteophyte formation at the olecranon process of  the ulna. Mild soft tissue swelling overlying the dorsal plate consistent with contusion. Correlation is made with the prior radiographs of the humerus from 12/28/2016. I cannot definitively tell if the screw was fractured at that time. IMPRESSION: 1. Soft tissue contusion overlying the dorsal the olecranon fixation plate. No evidence of acute fracture. 2. Age indeterminate hardware fracture involving the third most proximal screw. After comparing with the radiographs of the right humerus from 12/28/2016, I cannot tell if the screw was fractured at that time due to limited visualization of the hardware. Electronically Signed   By: Malachy Moan M.D.   On: 01/03/2017 17:38   Dg Chest Port 1 View  Result Date: 01/30/2017 CLINICAL DATA:  Cough and weakness EXAM: PORTABLE CHEST 1 VIEW COMPARISON:  None. FINDINGS: The heart size and mediastinal contours are within normal limits. Mild  increase in perihilar interstitial prominent may reflect acute bronchitic change. No pneumonic consolidation, CHF nor effusion. The visualized skeletal structures are unremarkable. IMPRESSION: Mild perihilar increase in interstitial lung markings, suspicious for acute bronchitic change. Electronically Signed   By: Tollie Eth M.D.   On: 01/30/2017 19:30

## 2017-01-30 NOTE — ED Triage Notes (Signed)
Patient from [facility] via [facility]. Per EMS, caregiver reports patient has been having trouble sitting up and she was scared he was going to wander off. Caregiver also reported to EMS that she didn't know what she was going to do with the patient but she didn't think she could take care of him like he was. Patient oriented x4 upon arrival. Denies pain. Denies any specific complaint.

## 2017-01-30 NOTE — ED Provider Notes (Signed)
[facility] Emergency Department Provider Note  ____________________________________________   First MD Initiated Contact with Patient 01/30/17 1902     (approximate)  I have reviewed the triage vital signs and the nursing notes.   HISTORY  Chief Complaint Altered Mental Status  History is challenging to obtain as the patient is a very poor historian  HPI Russell Buchanan is a 41 y.o. male who comes to the emergency department via EMS from his group home. According to the patient he requested his caretaker called 911 because "I don't feel right".he has had some mild cough recently. The attendant at the group home was worried that he might have a urinary tract infection.   Past Medical History:  Diagnosis Date  . Antisocial personality disorder   . GERD (gastroesophageal reflux disease)   . Hyperlipidemia   . Schizophrenia [facility])     Patient Active Problem List   Diagnosis Date Noted  . Acute encephalopathy 12/04/2016  . GERD (gastroesophageal reflux disease) 12/04/2016  . HLD (hyperlipidemia) 12/04/2016  . Schizophrenia (HCC) 11/15/2016  . Medication side effects 11/15/2016    Past Surgical History:  Procedure Laterality Date  . ORIF ELBOW FRACTURE Right   . WRIST RECONSTRUCTION Left     Prior to Admission medications   Medication Sig Start Date End Date Taking? Authorizing Provider  albuterol (PROVENTIL HFA;VENTOLIN HFA) 108 (90 Base) MCG/ACT inhaler Inhale 2 puffs into the lungs every 6 (six) hours as needed for wheezing or shortness of breath. 01/30/17   Merrily Brittle, MD  benztropine (COGENTIN) 1 MG tablet Take 1 mg by mouth 2 (two) times daily as needed for tremors.     [provider]  buPROPion (WELLBUTRIN XL) 150 MG 24 hr tablet Take 150 mg by mouth daily.    [provider]  Butalbital-APAP-Caffeine 50-325-40 MG per capsule Take 1 capsule by mouth 2 (two) times daily as needed for headache.     [provider]  cetirizine (ZYRTEC) 10 MG tablet Take 10 mg by mouth daily.    [provider]  clotrimazole-betamethasone (LOTRISONE) cream Apply 1 application topically 3 (three) times daily.    [provider]  divalproex (DEPAKOTE ER) 500 MG 24 hr tablet Take 500-1,000 mg by mouth 2 (two) times daily. Take 500 mg oral by mouth in the morning and take 1000 mg by mouth at bedtime.    [provider]  docusate sodium (COLACE) 100 MG capsule Take 100 mg by mouth daily.    [provider]  EPINEPHrine 0.3 mg/0.3 mL IJ SOAJ injection Inject 0.3 mg into the muscle as needed (allergic reactions).     [provider]  escitalopram (LEXAPRO) 20 MG tablet Take 20 mg by mouth daily.    [provider]  gemfibrozil (LOPID) 600 MG tablet Take 600 mg by mouth 2 (two) times daily before a meal.    [provider]  haloperidol (HALDOL) 10 MG tablet Take 10 mg by mouth 2 (two) times daily as needed (agitation).     [provider]  ketoconazole (NIZORAL) 2 % cream Apply 1 application topically 2 (two) times daily.    [provider]  linaclotide (LINZESS) 72 MCG capsule Take 72 mcg by mouth every other day.    [provider]  LORazepam (ATIVAN) 1 MG tablet Take 1 mg by mouth every 8 (eight) hours as needed for anxiety.    [provider]  magnesium gluconate (MAGONATE) 500 MG tablet Take 500  mg by mouth daily.    [provider]  nystatin cream (MYCOSTATIN) Apply 1 application topically 2 (two) times daily as needed for dry skin.     [provider]  ondansetron (ZOFRAN ODT) 4 MG disintegrating tablet Take 1 tablet (4 mg total) by mouth every 8 (eight) hours as needed for nausea or vomiting. Patient not taking: Reported on 01/05/2017 12/23/16   Russell Hong, MD  pantoprazole (PROTONIX) 40 MG tablet Take 40 mg by mouth 2 (two) times daily.     [provider]  propranolol (INNOPRAN XL) 80 MG 24 hr  capsule Take 80 mg by mouth daily.    [provider]  QUEtiapine (SEROQUEL XR) 400 MG 24 hr tablet Take 800 mg by mouth at bedtime.    [provider]  sucralfate (CARAFATE) 1 g tablet Take 1 g by mouth 4 (four) times daily -  with meals and at bedtime.    [provider]  tiotropium (SPIRIVA) 18 MCG inhalation capsule Place 18 mcg into inhaler and inhale daily.    [provider]    Allergies Bee venom  Family History  Problem Relation Age of Onset  . Family history unknown: Yes    Social History Social History  Substance Use Topics  . Smoking status: Current Every Day Smoker    Packs/day: 1.00    Types: Cigarettes  . Smokeless tobacco: Current User    Types: Chew, Snuff  . Alcohol use No    Review of Systems Constitutional: No fever/chills Eyes: No visual changes. ENT: No sore throat. Cardiovascular: Denies chest pain. Respiratory: Denies shortness of breath. Gastrointestinal: No abdominal pain.  No nausea, no vomiting.  No diarrhea.  No constipation. Genitourinary: Negative for dysuria. Musculoskeletal: Negative for back pain. Skin: Negative for rash. Neurological: Negative for headaches, focal weakness or numbness.   ____________________________________________   PHYSICAL EXAM:  VITAL SIGNS: ED Triage Vitals  Enc Vitals Group     BP 01/30/17 1859 (!) 128/95     Pulse Rate 01/30/17 1859 97     Resp 01/30/17 1859 16     Temp 01/30/17 1859 98.2 F (36.8 C)     Temp Source 01/30/17 1859 Oral     SpO2 01/30/17 1859 96 %     Weight 01/30/17 1900 230 lb (104.3 kg)     Height 01/30/17 1900 5\' 10"  (1.778 m)     Head Circumference --      Peak Flow --      Pain Score --      Pain Loc --      Pain Edu? --      Excl. in GC? --     Constitutional: pleasant cooperative no acute distress Eyes: PERRL EOMI. Head: Atraumatic. Nose: No congestion/rhinnorhea. Mouth/Throat: No trismus Neck: No stridor.   Cardiovascular: Normal  rate, regular rhythm. Grossly normal heart sounds.  Good peripheral circulation. Respiratory: Normal respiratory effort.  No retractions.mild expiratory wheeze but moving good air Gastrointestinal: soft nontender Musculoskeletal: No lower extremity edema   Neurologic:  Normal speech and language. No gross focal neurologic deficits are appreciated. Skin:  Skin is warm, dry and intact. No rash noted. Psychiatric: Mood and affect are normal. Speech and behavior are normal.    ____________________________________________ _______________________________________   LABS (all labs ordered are listed, but only abnormal results are displayed)  Labs Reviewed  CBC WITH DIFFERENTIAL/PLATELET - Abnormal; Notable for the following:       Result Value   Neutro  Abs 1.3 (*)    All other components within normal limits  COMPREHENSIVE METABOLIC PANEL - Abnormal; Notable for the following:    Potassium 3.3 (*)    Glucose, Bld 122 (*)    ALT 13 (*)    All other components within normal limits  URINALYSIS, COMPLETE (UACMP) WITH MICROSCOPIC    Blood work unremarkable __________________________________________  EKG   ____________________________________________  RADIOLOGY  Chest x-ray suggestive of bronchitis ____________________________________________   PROCEDURES  Procedure(s) performed: no  Procedures  Critical Care performed: no  Observation: no ____________________________________________   INITIAL IMPRESSION / ASSESSMENT AND PLAN / ED COURSE  Pertinent labs & imaging results that were available during my care of the patient were reviewed by me and considered in my medical decision making (see chart for details).  The patient arrives very well-appearing with mild wheeze and generalized malaise.  He is hemodynamically stable and afebrile. Chest x-ray and labs are pending.    ----------------------------------------- 10:04 PM on  01/30/2017 -----------------------------------------  The patient eels improved after his breathing treatment. He has not been able to urinate yet but in a healthy 41 year old man urinary tract infection is extraordinarily unlikely. At this point I'm comfortable discharging him home with albuterol for his bronchitis. He does not require antibiotics.  ____________________________________________   FINAL CLINICAL IMPRESSION(S) / ED DIAGNOSES  Final diagnoses:  Bronchitis      NEW MEDICATIONS STARTED DURING THIS VISIT:  New Prescriptions   ALBUTEROL (PROVENTIL HFA;VENTOLIN HFA) 108 (90 BASE) MCG/ACT INHALER    Inhale 2 puffs into the lungs every 6 (six) hours as needed for wheezing or shortness of breath.     Note:  This document was prepared using Dragon voice recognition software and may include unintentional dictation errors.     Merrily Brittle, MD 01/30/17 2205

## 2017-01-30 NOTE — ED Notes (Signed)
Pt.  Going home via [place], provided by group home.

## 2017-01-30 NOTE — ED Notes (Signed)
Celedonio Miyamoto [phone]) [phone] at [facility]  [address].  Gave report to [employer].  Ms. Laural Benes

## 2017-05-21 ENCOUNTER — Ambulatory Visit: Payer: Medicaid Other | Attending: Neurology

## 2017-05-21 DIAGNOSIS — G4733 Obstructive sleep apnea (adult) (pediatric): Secondary | ICD-10-CM | POA: Diagnosis not present

## 2017-05-31 ENCOUNTER — Ambulatory Visit: Payer: Self-pay | Admitting: *Deleted

## 2017-05-31 NOTE — Telephone Encounter (Signed)
Pt states that he backed out in bathroom "everything goes black"; and he has had about 5 this year; pt states that he is getting too much seroquel (800mg  nightly);he also states when he "gets up he feels overly tired"; pt states he had episode on 12/23 and 12/28; pt states that he sees Dr Clelia Croft a neurologist with [facility]; spoke with Kym Groom, supervisor in charge at the family care home; and she states Franco Nones [phone] wrote the prescription for this medication, and that the pt's medication had been decreased from 1000 mg to 800 mg; per nurse triage recommendation, the supervisor will send the pt to the ED for evaluation.     Reason for Disposition . [1] All other patients AND [2] now alert and feels fine(Exception: SIMPLE FAINT due to stress, pain, prolonged standing, or suddenly standing)  Answer Assessment - Initial Assessment Questions 1. ONSET: "How long were you unconscious?" (minutes) "When did it happen?"     5 minutes 2. CONTENT: "What happened during period of unconsciousness?" (e.g., seizure activity)      Everything went black 3. MENTAL STATUS: "Alert and oriented now?" (oriented x 3 = name, month, location)      yes 4. TRIGGER: "What do you think caused the fainting?" "What were you doing just before you fainted?"  (e.g., exercise, sudden standing up, prolonged standing)     Standing for 5 minutes then sat down on couch; happened with movement 5. RECURRENT SYMPTOM: "Have you ever passed out before?" If so, ask: "When was the last time?" and "What happened that time?"      05/31/17 at 1325 6. INJURY: "Did you sustain any injury during the fall?"      no 7. CARDIAC SYMPTOMS: "Have you had any of the following symptoms: chest pain, difficulty breathing, palpitations?"     no 8. NEUROLOGIC SYMPTOMS: "Have you had any of the following symptoms: headache, numbness, vertigo, weakness?"     Dizziness, pt has history of migraines 9. GI SYMPTOMS: "Have you had any of the following  symptoms: abdominal pain, vomiting, diarrhea, blood in stools?"     Nausea, vomiting 05/26/17 10. OTHER SYMPTOMS: "Do you have any other symptoms?"       no 11. PREGNANCY: "Is there any chance you are pregnant?" "When was your last menstrual period?"       n/a  Protocols used: [facility]

## 2018-02-25 ENCOUNTER — Ambulatory Visit: Payer: Self-pay | Admitting: Physician Assistant

## 2018-04-07 ENCOUNTER — Other Ambulatory Visit: Payer: Self-pay | Admitting: Specialist

## 2018-04-09 ENCOUNTER — Other Ambulatory Visit: Payer: Self-pay

## 2018-04-09 ENCOUNTER — Encounter
Admission: RE | Admit: 2018-04-09 | Discharge: 2018-04-09 | Disposition: A | Discharge status: Home / Self Care | Payer: Medicaid Other | Source: Ambulatory Visit | Attending: Specialist | Admitting: Specialist

## 2018-04-09 DIAGNOSIS — Z01818 Encounter for other preprocedural examination: Secondary | ICD-10-CM | POA: Diagnosis not present

## 2018-04-09 HISTORY — DX: Headache, unspecified: R51.9

## 2018-04-09 HISTORY — DX: Tremor, unspecified: R25.1

## 2018-04-09 HISTORY — DX: Bipolar disorder, unspecified: F31.9

## 2018-04-09 HISTORY — DX: Other allergic rhinitis: J30.89

## 2018-04-09 HISTORY — DX: Headache: R51

## 2018-04-09 HISTORY — DX: Pyogenic arthritis, unspecified: M00.9

## 2018-04-09 MED ORDER — CHLORHEXIDINE GLUCONATE CLOTH 2 % EX PADS
6.0000 | MEDICATED_PAD | Freq: Once | CUTANEOUS | Status: DC
  Filled 2018-04-09: qty 6

## 2018-04-09 NOTE — Patient Instructions (Addendum)
Your procedure is scheduled on: 04/14/18 Mon Report to Same Day Surgery 2nd floor medical mall [facility] Entrance-take elevator on left to 2nd floor.  Check in with surgery information desk.) To find out your arrival time please call [phone] between 1PM - 3PM on 04/11/18 Fri  Remember: Instructions that are not followed completely may result in serious medical risk, up to and including death, or upon the discretion of your surgeon and anesthesiologist your surgery may need to be rescheduled.    _x___ 1. Do not eat food after midnight the night before your procedure. You may drink clear liquids up to 2 hours before you are scheduled to arrive at the hospital for your procedure.  Do not drink clear liquids within 2 hours of your scheduled arrival to the hospital.  Clear liquids include  --Water or Apple juice without pulp  --Clear carbohydrate beverage such as ClearFast or Gatorade  --Black Coffee or Clear Tea (No milk, no creamers, do not add anything to                  the coffee or Tea Type 1 and type 2 diabetics should only drink water.   ____Ensure clear carbohydrate drink on the way to the hospital for bariatric patients  ____Ensure clear carbohydrate drink 3 hours before surgery for Dr Rutherford Nail patients if physician instructed.   No gum chewing or hard candies.     __x__ 2. No Alcohol for 24 hours before or after surgery.   __x__3. No Smoking or e-cigarettes for 24 prior to surgery.  Do not use any chewable tobacco products for at least 6 hour prior to surgery   ____  4. Bring all medications with you on the day of surgery if instructed.    __x__ 5. Notify your doctor if there is any change in your medical condition     (cold, fever, infections).    x___6. On the morning of surgery brush your teeth with toothpaste and water.  You may rinse your mouth with mouth wash if you wish.  Do not swallow any toothpaste or mouthwash.   Do not wear jewelry, make-up, hairpins,  clips or nail polish.  Do not wear lotions, powders, or perfumes. You may wear deodorant.  Do not shave 48 hours prior to surgery. Men may shave face and neck.  Do not bring valuables to the hospital.    [facility] is not responsible for any belongings or valuables.               Contacts, dentures or bridgework may not be worn into surgery.  Leave your suitcase in the car. After surgery it may be brought to your room.  For patients admitted to the hospital, discharge time is determined by your                       treatment team.  _  Patients discharged the day of surgery will not be allowed to drive home.  You will need someone to drive you home and stay with you the night of your procedure.    Please read over the following fact sheets that you were given:   [facility] Preparing for Surgery and or MRSA Information   _x___ Take anti-hypertensive listed below, cardiac, seizure, asthma,     anti-reflux and psychiatric medicines. These include:  1. benztropine (COGENTIN) 1 MG tablet  2.buPROPion (WELLBUTRIN XL) 150 MG 24 hr tablet  3.cetirizine (ZYRTEC) 10 MG  tablet  4.clonazePAM (KLONOPIN) 1 MG tablet  5.escitalopram (LEXAPRO) 20 MG tablet  6.propranolol (INNOPRAN XL) 80 MG 24 hr capsule  7.divalproex (DEPAKOTE ER) 500 MG 24 hr tablet  ____Fleets enema or Magnesium Citrate as directed.   _x___ Use CHG Soap or sage wipes as directed on instruction sheet   ____ Use inhalers on the day of surgery and bring to hospital day of surgery  ____ Stop Metformin and Janumet 2 days prior to surgery.    ____ Take 1/2 of usual insulin dose the night before surgery and none on the morning     surgery.   _x___ Follow recommendations from Cardiologist, Pulmonologist or PCP regarding          stopping Aspirin, Coumadin, Plavix ,Eliquis, Effient, or Pradaxa, and Pletal.  X____Stop Anti-inflammatories such as Advil, Aleve, Ibuprofen, Motrin, Naproxen, Naprosyn, Goodies powders or aspirin products.  OK to take Tylenol and                          Celebrex.   _x___ Stop supplements until after surgery.  But may continue Vitamin D, Vitamin B,       and multivitamin.   ____ Bring C-Pap to the hospital.

## 2018-04-14 ENCOUNTER — Encounter: Payer: Self-pay | Admitting: *Deleted

## 2018-04-14 ENCOUNTER — Ambulatory Visit: Payer: Medicaid Other | Admitting: Anesthesiology

## 2018-04-14 ENCOUNTER — Ambulatory Visit
Admission: RE | Admit: 2018-04-14 | Discharge: 2018-04-14 | Disposition: A | Discharge status: Home / Self Care | Payer: Medicaid Other | Source: Ambulatory Visit | Attending: Specialist | Admitting: Specialist

## 2018-04-14 ENCOUNTER — Encounter
Admission: RE | Disposition: A | Discharge status: Home / Self Care | Payer: Self-pay | Source: Ambulatory Visit | Attending: Specialist

## 2018-04-14 DIAGNOSIS — K219 Gastro-esophageal reflux disease without esophagitis: Secondary | ICD-10-CM | POA: Insufficient documentation

## 2018-04-14 DIAGNOSIS — F209 Schizophrenia, unspecified: Secondary | ICD-10-CM | POA: Insufficient documentation

## 2018-04-14 DIAGNOSIS — F319 Bipolar disorder, unspecified: Secondary | ICD-10-CM | POA: Diagnosis not present

## 2018-04-14 DIAGNOSIS — E785 Hyperlipidemia, unspecified: Secondary | ICD-10-CM | POA: Insufficient documentation

## 2018-04-14 DIAGNOSIS — F17200 Nicotine dependence, unspecified, uncomplicated: Secondary | ICD-10-CM | POA: Diagnosis not present

## 2018-04-14 DIAGNOSIS — R51 Headache: Secondary | ICD-10-CM | POA: Insufficient documentation

## 2018-04-14 DIAGNOSIS — Y793 Surgical instruments, materials and orthopedic devices (including sutures) associated with adverse incidents: Secondary | ICD-10-CM | POA: Diagnosis not present

## 2018-04-14 DIAGNOSIS — T84614A Infection and inflammatory reaction due to internal fixation device of right ulna, initial encounter: Secondary | ICD-10-CM | POA: Diagnosis not present

## 2018-04-14 DIAGNOSIS — Z79899 Other long term (current) drug therapy: Secondary | ICD-10-CM | POA: Diagnosis not present

## 2018-04-14 HISTORY — PX: HARDWARE REMOVAL: SHX979

## 2018-04-14 SURGERY — REMOVAL, HARDWARE
Anesthesia: General | Laterality: Right

## 2018-04-14 MED ORDER — ACETAMINOPHEN 10 MG/ML IV SOLN
INTRAVENOUS | Status: AC
  Filled 2018-04-14: qty 100

## 2018-04-14 MED ORDER — BUPIVACAINE HCL (PF) 0.5 % IJ SOLN
INTRAMUSCULAR | Status: DC | PRN
  Administered 2018-04-14: 30 mL

## 2018-04-14 MED ORDER — ONDANSETRON HCL 4 MG/2ML IJ SOLN
INTRAMUSCULAR | Status: AC
  Filled 2018-04-14: qty 2

## 2018-04-14 MED ORDER — FENTANYL CITRATE (PF) 100 MCG/2ML IJ SOLN
INTRAMUSCULAR | Status: AC
  Filled 2018-04-14: qty 2

## 2018-04-14 MED ORDER — ROCURONIUM BROMIDE 100 MG/10ML IV SOLN
INTRAVENOUS | Status: DC | PRN
  Administered 2018-04-14: 50 mg via INTRAVENOUS

## 2018-04-14 MED ORDER — LIDOCAINE HCL (PF) 2 % IJ SOLN
INTRAMUSCULAR | Status: AC
  Filled 2018-04-14: qty 10

## 2018-04-14 MED ORDER — MIDAZOLAM HCL 2 MG/2ML IJ SOLN
INTRAMUSCULAR | Status: DC | PRN
  Administered 2018-04-14: 2 mg via INTRAVENOUS

## 2018-04-14 MED ORDER — CEFAZOLIN SODIUM-DEXTROSE 2-3 GM-%(50ML) IV SOLR
INTRAVENOUS | Status: DC | PRN
  Administered 2018-04-14: 2 g via INTRAVENOUS

## 2018-04-14 MED ORDER — MIDAZOLAM HCL 2 MG/2ML IJ SOLN
INTRAMUSCULAR | Status: AC
  Filled 2018-04-14: qty 2

## 2018-04-14 MED ORDER — MELOXICAM 7.5 MG PO TABS
15.0000 mg | ORAL_TABLET | ORAL | Status: AC
  Administered 2018-04-14: 15 mg via ORAL

## 2018-04-14 MED ORDER — FENTANYL CITRATE (PF) 100 MCG/2ML IJ SOLN: 25.0000 ug | INTRAMUSCULAR | Status: DC | PRN

## 2018-04-14 MED ORDER — GABAPENTIN 300 MG PO CAPS
300.0000 mg | ORAL_CAPSULE | ORAL | Status: AC
  Administered 2018-04-14: 300 mg via ORAL

## 2018-04-14 MED ORDER — ONDANSETRON HCL 4 MG/2ML IJ SOLN
4.0000 mg | Freq: Once | INTRAMUSCULAR | Status: AC | PRN
  Administered 2018-04-14: 4 mg via INTRAVENOUS

## 2018-04-14 MED ORDER — LIDOCAINE HCL (CARDIAC) PF 100 MG/5ML IV SOSY
PREFILLED_SYRINGE | INTRAVENOUS | Status: DC | PRN
  Administered 2018-04-14: 100 mg via INTRAVENOUS

## 2018-04-14 MED ORDER — PROPOFOL 10 MG/ML IV BOLUS
INTRAVENOUS | Status: DC | PRN
  Administered 2018-04-14: 200 mg via INTRAVENOUS

## 2018-04-14 MED ORDER — MELOXICAM 15 MG PO TABS: 15.0000 mg | ORAL_TABLET | Freq: Every day | ORAL | 3 refills | Status: DC

## 2018-04-14 MED ORDER — SUGAMMADEX SODIUM 200 MG/2ML IV SOLN
INTRAVENOUS | Status: DC | PRN
  Administered 2018-04-14: 100 mg via INTRAVENOUS

## 2018-04-14 MED ORDER — LACTATED RINGERS IV SOLN
INTRAVENOUS | Status: DC
  Administered 2018-04-14: 11:00:00 via INTRAVENOUS

## 2018-04-14 MED ORDER — GABAPENTIN 400 MG PO CAPS: 400.0000 mg | ORAL_CAPSULE | Freq: Three times a day (TID) | ORAL | 3 refills | Status: DC

## 2018-04-14 MED ORDER — CEPHALEXIN 500 MG PO CAPS: 500.0000 mg | ORAL_CAPSULE | Freq: Three times a day (TID) | ORAL | 3 refills | Status: DC

## 2018-04-14 MED ORDER — FENTANYL CITRATE (PF) 100 MCG/2ML IJ SOLN
INTRAMUSCULAR | Status: DC | PRN
  Administered 2018-04-14 (×2): 50 ug via INTRAVENOUS
  Administered 2018-04-14: 100 ug via INTRAVENOUS

## 2018-04-14 MED ORDER — PROPOFOL 10 MG/ML IV BOLUS
INTRAVENOUS | Status: AC
  Filled 2018-04-14: qty 20

## 2018-04-14 MED ORDER — HYDROCODONE-ACETAMINOPHEN 5-325 MG PO TABS: 1.0000 | ORAL_TABLET | Freq: Four times a day (QID) | ORAL | 0 refills | Status: DC | PRN

## 2018-04-14 MED ORDER — SULFAMETHOXAZOLE-TRIMETHOPRIM 800-160 MG PO TABS: 1.0000 | ORAL_TABLET | Freq: Two times a day (BID) | ORAL | 3 refills | Status: DC

## 2018-04-14 MED ORDER — ONDANSETRON HCL 4 MG/2ML IJ SOLN
INTRAMUSCULAR | Status: DC | PRN
  Administered 2018-04-14: 4 mg via INTRAVENOUS

## 2018-04-14 MED ORDER — CLINDAMYCIN PHOSPHATE 600 MG/50ML IV SOLN
INTRAVENOUS | Status: DC | PRN
  Administered 2018-04-14: 600 mg via INTRAVENOUS

## 2018-04-14 MED ORDER — LACTATED RINGERS IV SOLN
INTRAVENOUS | Status: DC | PRN
  Administered 2018-04-14: 13:00:00 via INTRAVENOUS

## 2018-04-14 MED ORDER — ACETAMINOPHEN 10 MG/ML IV SOLN
INTRAVENOUS | Status: DC | PRN
  Administered 2018-04-14: 1000 mg via INTRAVENOUS

## 2018-04-14 SURGICAL SUPPLY — 38 items
BLADE SURG 15 STRL LF DISP TIS (BLADE) IMPLANT
BLADE SURG 15 STRL SS (BLADE) ×4
BNDG COHESIVE 4X5 TAN STRL (GAUZE/BANDAGES/DRESSINGS) ×2 IMPLANT
BNDG ESMARK 4X12 TAN STRL LF (GAUZE/BANDAGES/DRESSINGS) ×3 IMPLANT
CANISTER SUCT 1200ML W/VALVE (MISCELLANEOUS) ×3 IMPLANT
CHLORAPREP W/TINT 26ML (MISCELLANEOUS) ×6 IMPLANT
CNTNR SPEC 2.5X3XGRAD LEK (MISCELLANEOUS) ×1
CONT SPEC 4OZ STER OR WHT (MISCELLANEOUS) ×2
CONTAINER SPEC 2.5X3XGRAD LEK (MISCELLANEOUS) IMPLANT
COVER WAND RF STERILE (DRAPES) ×3 IMPLANT
CUFF TOURN 18 STER (MISCELLANEOUS) IMPLANT
CUFF TOURN 24 STER (MISCELLANEOUS) IMPLANT
DRAPE FLUOR MINI C-ARM 54X84 (DRAPES) ×3 IMPLANT
ELECT REM PT RETURN 9FT ADLT (ELECTROSURGICAL) ×3
ELECTRODE REM PT RTRN 9FT ADLT (ELECTROSURGICAL) ×1 IMPLANT
GAUZE PETRO XEROFOAM 1X8 (MISCELLANEOUS) ×1 IMPLANT
GAUZE SPONGE 4X4 12PLY STRL (GAUZE/BANDAGES/DRESSINGS) ×3 IMPLANT
GLOVE SURG ORTHO 8.0 STRL STRW (GLOVE) ×3 IMPLANT
GOWN STRL REUS W/ TWL XL LVL3 (GOWN DISPOSABLE) ×1 IMPLANT
GOWN STRL REUS W/TWL LRG LVL4 (GOWN DISPOSABLE) ×3 IMPLANT
GOWN STRL REUS W/TWL XL LVL3 (GOWN DISPOSABLE) ×2
KIT TURNOVER KIT A (KITS) ×3 IMPLANT
NS IRRIG 500ML POUR BTL (IV SOLUTION) ×3 IMPLANT
PACK EXTREMITY ARMC (MISCELLANEOUS) ×3 IMPLANT
PADDING CAST 4IN STRL (MISCELLANEOUS) ×4
PADDING CAST BLEND 4X4 STRL (MISCELLANEOUS) ×2 IMPLANT
SOL PREP PVP 2OZ (MISCELLANEOUS) ×3
SOLUTION PREP PVP 2OZ (MISCELLANEOUS) ×1 IMPLANT
STAPLER SKIN PROX 35W (STAPLE) ×2 IMPLANT
STOCKINETTE BIAS CUT 4 980044 (GAUZE/BANDAGES/DRESSINGS) ×3 IMPLANT
STOCKINETTE BIAS CUT 6 980064 (GAUZE/BANDAGES/DRESSINGS) ×3 IMPLANT
STOCKINETTE STRL 6IN 960660 (GAUZE/BANDAGES/DRESSINGS) ×3 IMPLANT
SUT ETHILON 4-0 (SUTURE) ×2
SUT ETHILON 4-0 FS2 18XMFL BLK (SUTURE) ×1
SUT VIC AB 2-0 CT1 (SUTURE) ×2 IMPLANT
SUT VIC AB 3-0 SH 27 (SUTURE) ×2
SUT VIC AB 3-0 SH 27X BRD (SUTURE) ×1 IMPLANT
SUTURE ETHLN 4-0 FS2 18XMF BLK (SUTURE) ×1 IMPLANT

## 2018-04-14 NOTE — Op Note (Signed)
04/14/2018  2:48 PM  PATIENT:  Russell Buchanan  42 y.o. male  PRE-OPERATIVE DIAGNOSIS:  local infection of the skin and subcutaneous   POST-OPERATIVE DIAGNOSIS:  Same  PROCEDURE:  HARDWARE REMOVAL-RIGHT ELBOW  SURGEON:  Liza Czerwinski E Antonio Woodhams MD  ASST:    ANESTHESIA:   General  EBL:  minimal  TOURNIQUET TIME:  71  OPERATIVE FINDINGS: There was granulation tissue around the distal screws but no frank pus.  3 screws were embedded in bone and were unable to be removed.  Therefore the plate was also left in place.  OPERATIVE PROCEDURE: The patient was brought to the operating room and underwent satisfactory general anesthesia in the supine position.  The right arm was prepped and draped in sterile fashion.  The arm was exsanguinated and tourniquet inflated to 300 mmHg.  His previous incision was reopened.  Dissection was carried out down through scar tissue to the radial side of the ulna and the plate was found here.  Cultures were taken at that time both fluid and tissue.  IV antibiotics were then given 2 g Kefzol and 600 mg Cleocin.  There were several Mersilene sutures embedded in scar from cerclage type fixation.  These were removed as much as possible.  The entire plate was exposed to sharp dissection.  I started removing screws and was able to remove 4 screws.  However 3 screws were embedded in bone so tightly that they could not be budged.  The screw removal set was brought out and I attempted to use this where one screw had  been completely stripped.  Although I was able to secure this the screw still would not budge.  I do not feel that any further progress could be made on the screws and elected to terminate the procedure at that time.  Granulation tissue was removed.  Wound was irrigated.  Fascia was closed with 2-0 Vicryl.  Subcutaneous tissue was closed with 3-0 Vicryl.  Skin was closed staples.  A padded dry sterile dressing was applied.  Tourniquet was deflated with good return of blood  flow.  Patient was awakened and taken recovery in good condition.  Deeann Saint, MD

## 2018-04-14 NOTE — Discharge Instructions (Addendum)
Hardware Removal, Care After Refer to this sheet in the next few weeks. These instructions provide you with information about caring for yourself after your procedure. Your health care provider may also give you more specific instructions. Your treatment has been planned according to current medical practices, but problems sometimes occur. Call your health care provider if you have any problems or questions after your procedure. What can I expect after the procedure? After the procedure, it is common to have:  Some pain.  Nausea.  Some swelling in the area where hardware was removed.  Follow these instructions at home:  Take medicines only as directed by your health care provider.  Follow instructions from your health care provider about: ? Rest. ? Physical activity. ? Bearing weight. Contact a health care provider if:  You have lasting pain.  You are unable to perform exercises or physical activity as directed by your health care provider. Get help right away if:  You have severe pain.  You have a fever or chills.  You have redness, heat, swelling, or pain at the site of your incision.  You have fluid, blood, or pus coming from your incision.  You have difficulty breathing.  You cannot pass gas.  You are unable to have a bowel movement within 2 days.  You have numbness for more than 24 hours in the area where hardware was removed. This information is not intended to replace advice given to you by your health care provider. Make sure you discuss any questions you have with your health care provider. Document Released: 10/05/2014 Document Revised: 10/27/2015 Document Reviewed: 05/17/2014 Elsevier Interactive Patient Education  2018 [employer]  AMBULATORY SURGERY  DISCHARGE INSTRUCTIONS   1) The drugs that you were given will stay in your system until tomorrow so for the next 24 hours you should not:  A) Drive an automobile B) Make any legal decisions C) Drink  any alcoholic beverage   2) You may resume regular meals tomorrow.  Today it is better to start with liquids and gradually work up to solid foods.  You may eat anything you prefer, but it is better to start with liquids, then soup and crackers, and gradually work up to solid foods.   3) Please notify your doctor immediately if you have any unusual bleeding, trouble breathing, redness and pain at the surgery site, drainage, fever, or pain not relieved by medication.    4) Additional Instructions:        Please contact your physician with any problems or Same Day Surgery at [phone], Monday through Friday 6 am to 4 pm, or [facility] number at [phone].

## 2018-04-14 NOTE — Transfer of Care (Signed)
Immediate Anesthesia Transfer of Care Note  Patient: Russell Buchanan  Procedure(s) Performed: HARDWARE REMOVAL-RIGHT ELBOW (Right )  Patient Location: PACU  Anesthesia Type:General  Level of Consciousness: awake  Airway & Oxygen Therapy: Patient Spontanous Breathing  Post-op Assessment: Report given to RN  Post vital signs: stable  Last Vitals:  Vitals Value Taken Time  BP 127/89 04/14/2018  2:58 PM  Temp    Pulse 66 04/14/2018  3:00 PM  Resp 11 04/14/2018  3:00 PM  SpO2 96 % 04/14/2018  3:00 PM  Vitals shown include unvalidated device data.  Last Pain:  Vitals:   04/14/18 1109  TempSrc: Tympanic  PainSc: 0-No pain         Complications: No apparent anesthesia complications

## 2018-04-14 NOTE — H&P (Signed)
THE PATIENT WAS SEEN PRIOR TO SURGERY TODAY.  HISTORY, ALLERGIES, HOME MEDICATIONS AND OPERATIVE PROCEDURE WERE REVIEWED. RISKS AND BENEFITS OF SURGERY DISCUSSED WITH PATIENT AGAIN.  NO CHANGES FROM INITIAL HISTORY AND PHYSICAL NOTED.    

## 2018-04-14 NOTE — Anesthesia Post-op Follow-up Note (Signed)
Anesthesia QCDR form completed.        

## 2018-04-14 NOTE — Anesthesia Procedure Notes (Signed)
Date/Time: 04/14/2018 1:18 PM Performed by: Camille Bal, CRNA Pre-anesthesia Checklist: Patient identified, Emergency Drugs available, Suction available, Patient being monitored and Timeout performed Patient Re-evaluated:Patient Re-evaluated prior to induction Oxygen Delivery Method: Circle system utilized Preoxygenation: Pre-oxygenation with 100% oxygen Induction Type: IV induction Ventilation: Mask ventilation without difficulty Laryngoscope Size: McGraph and 3 Grade View: Grade I Tube type: Oral Tube size: 7.0 mm Number of attempts: 1 Placement Confirmation: ETT inserted through vocal cords under direct vision,  positive ETCO2,  CO2 detector and breath sounds checked- equal and bilateral Secured at: 22 cm Tube secured with: Tape Dental Injury: Teeth and Oropharynx as per pre-operative assessment

## 2018-04-14 NOTE — Anesthesia Preprocedure Evaluation (Signed)
Anesthesia Evaluation  Patient identified by MRN, date of birth, ID band Patient awake    Reviewed: Allergy & Precautions, H&P , NPO status , Patient's Chart, lab work & pertinent test results, reviewed documented beta blocker date and time   Airway Mallampati: III  TM Distance: >3 FB Neck ROM: full    Dental  (+) Teeth Intact, Poor Dentition   Pulmonary neg pulmonary ROS, Current Smoker,    Pulmonary exam normal        Cardiovascular Exercise Tolerance: Good negative cardio ROS Normal cardiovascular exam Rhythm:regular Rate:Normal     Neuro/Psych  Headaches, negative psych ROS   GI/Hepatic Neg liver ROS, GERD  Medicated,  Endo/Other  negative endocrine ROS  Renal/GU negative Renal ROS  negative genitourinary   Musculoskeletal   Abdominal   Peds  Hematology negative hematology ROS (+)   Anesthesia Other Findings Past Medical History: No date: Antisocial personality disorder (HCC) No date: Bipolar disorder (HCC) No date: Environmental and seasonal allergies No date: GERD (gastroesophageal reflux disease) No date: Headache No date: Hyperlipidemia No date: Infected elbow (HCC) No date: Schizophrenia (HCC) No date: Tremors of nervous system Past Surgical History: No date: CLOSED REDUCTION WRIST FRACTURE; Left No date: ORIF ELBOW FRACTURE; Right No date: SCALP LACERATION REPAIR No date: WRIST RECONSTRUCTION; Left   Reproductive/Obstetrics negative OB ROS                             Anesthesia Physical Anesthesia Plan  ASA: III  Anesthesia Plan: General ETT   Post-op Pain Management:    Induction:   PONV Risk Score and Plan:   Airway Management Planned:   Additional Equipment:   Intra-op Plan:   Post-operative Plan:   Informed Consent: I have reviewed the patients History and Physical, chart, labs and discussed the procedure including the risks, benefits and  alternatives for the proposed anesthesia with the patient or authorized representative who has indicated his/her understanding and acceptance.   Dental Advisory Given  Plan Discussed with: CRNA  Anesthesia Plan Comments: (Very poor dentition with multiple abscessed teeth and missing teeth.  Risks to teeth addressed and accepted. JA)        Anesthesia Quick Evaluation

## 2018-04-15 ENCOUNTER — Encounter: Payer: Self-pay | Admitting: Specialist

## 2018-04-17 NOTE — Anesthesia Postprocedure Evaluation (Signed)
Anesthesia Post Note  Patient: Ra Knabe  Procedure(s) Performed: HARDWARE REMOVAL-RIGHT ELBOW (Right )  Patient location during evaluation: PACU Anesthesia Type: General Level of consciousness: awake and alert Pain management: pain level controlled Vital Signs Assessment: post-procedure vital signs reviewed and stable Respiratory status: spontaneous breathing, nonlabored ventilation, respiratory function stable and patient connected to nasal cannula oxygen Cardiovascular status: blood pressure returned to baseline and stable Postop Assessment: no apparent nausea or vomiting Anesthetic complications: no     Last Vitals:  Vitals:   04/14/18 1554 04/14/18 1629  BP: 108/86 115/82  Pulse: 61 74  Resp: 16 17  Temp: (!) 35.6 C (!) 36 C  SpO2: 93% 97%    Last Pain:  Vitals:   04/14/18 1629  TempSrc: Temporal  PainSc: 0-No pain                 Yevette Edwards

## 2018-04-20 LAB — AEROBIC/ANAEROBIC CULTURE W GRAM STAIN (SURGICAL/DEEP WOUND)
Gram Stain: NONE SEEN
Gram Stain: NONE SEEN

## 2018-04-20 LAB — AEROBIC/ANAEROBIC CULTURE (SURGICAL/DEEP WOUND)
CULTURE: NO GROWTH
CULTURE: NO GROWTH

## 2018-04-25 IMAGING — RF DG UGI W/ SMALL BOWEL HIGH DENSITY
15 of 22 series · 15 of 24 positions shown · non-contrast
Comparison: None.

CLINICAL DATA: Nausea, vomiting

EXAM:
UPPER GI SERIES WITH SMALL BOWEL FOLLOW-THROUGH
FLUOROSCOPY TIME:  Fluoroscopy Time:  1.4 minutes
Radiation Exposure Index (if provided by the fluoroscopic device):
108 mGy
Number of Acquired Spot Images: 0
TECHNIQUE: Combined double contrast and single contrast upper GI series using
effervescent crystals, thick barium, and thin barium. Subsequently,
serial images of the small bowel were obtained including spot views
of the terminal ileum.

[Series 1: t abdomen supine · 0.15mm/px · 1 of 1 slices shown]
[im 1/1]
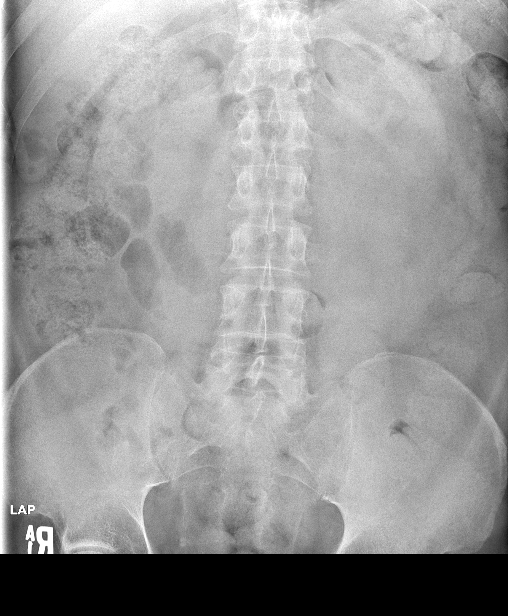

[Series 2: cp_standard · 0.51mm/px · 1 of 53 frames shown (1 of 11)]
[frame 8/53]
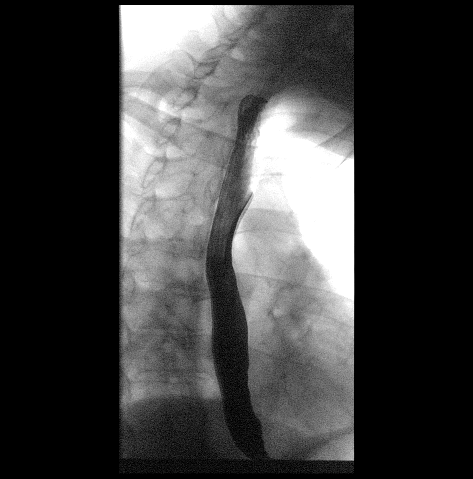

[Series 3: cp_standard · 0.25mm/px · 1 of 1 slices shown (2 of 11)]
[im 1/1]
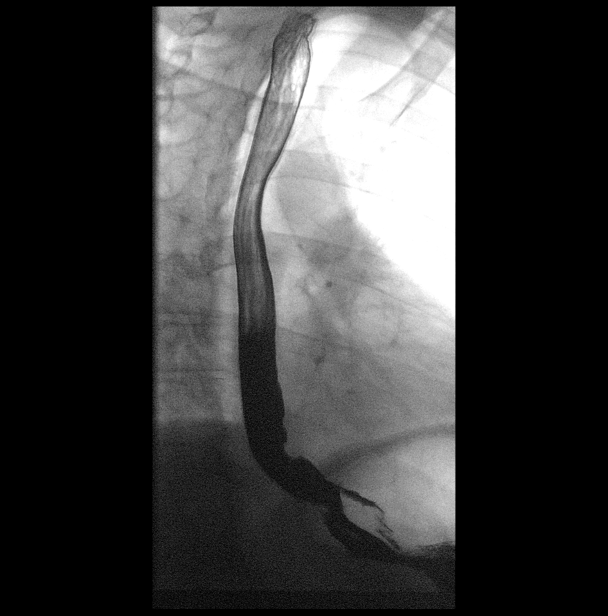

[Series 4: cp_standard · 0.26mm/px · 1 of 1 slices shown (3 of 11)]
[im 1/1]
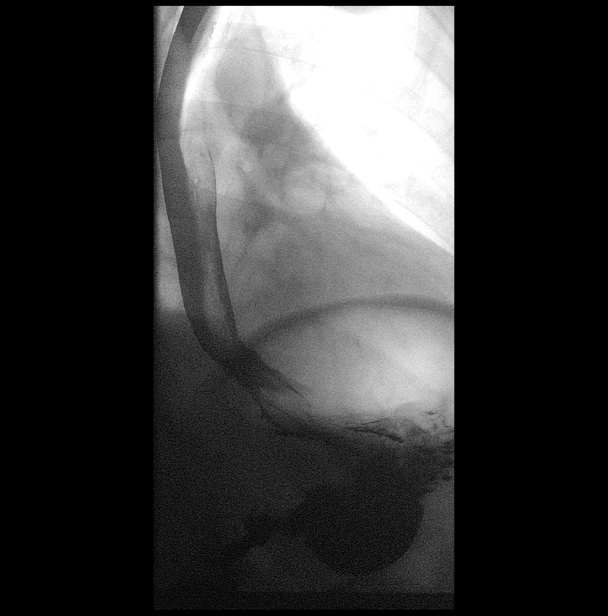

[Series 6: cp_standard · 0.26mm/px · 1 of 1 slices shown (4 of 11)]
[im 1/1]
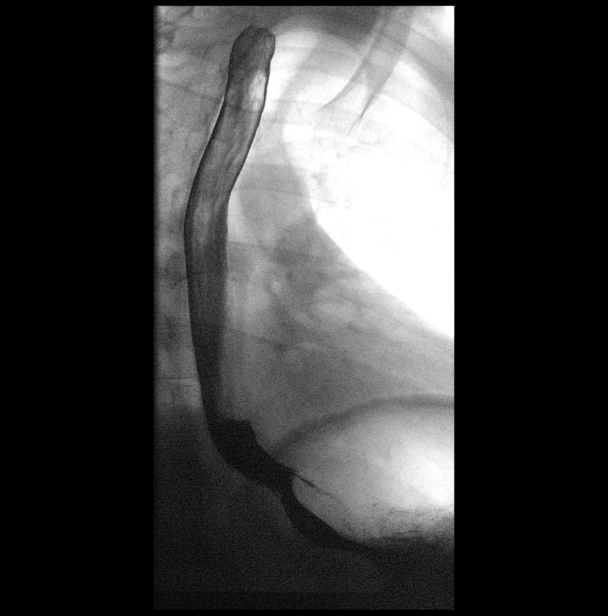

[Series 7: cp_standard · 0.27mm/px · 1 of 1 slices shown (5 of 11)]
[im 1/1]
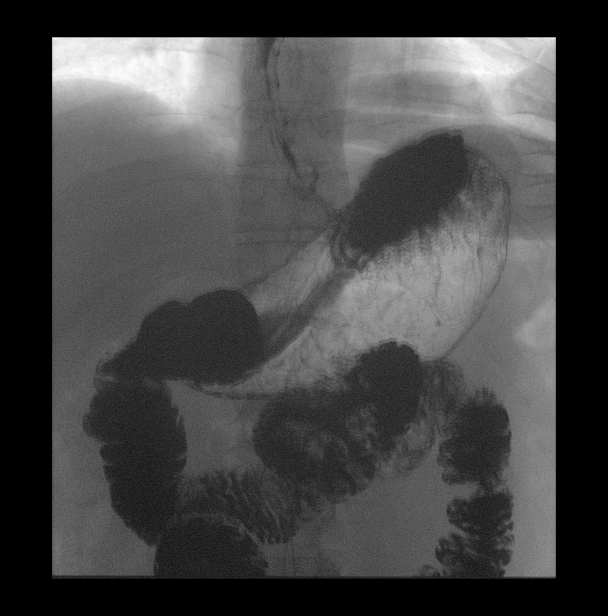

[Series 9: cp_standard · 0.28mm/px · 1 of 1 slices shown (6 of 11)]
[im 1/1]
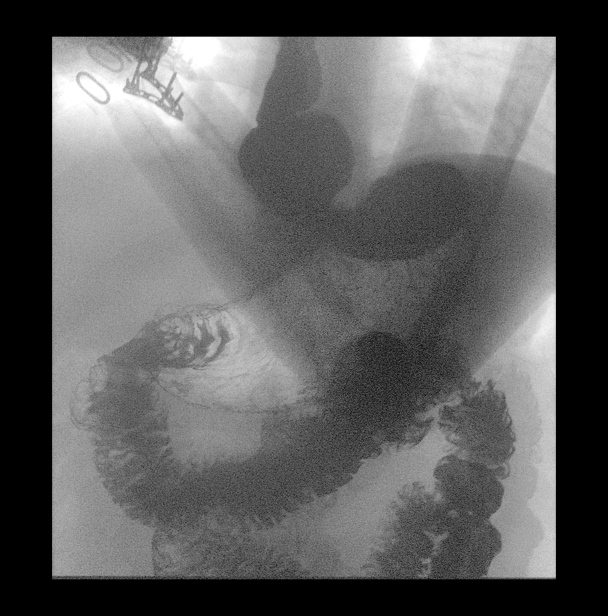

[Series 12: cp_standard · 0.29mm/px · 1 of 1 slices shown (7 of 11)]
[im 1/1]
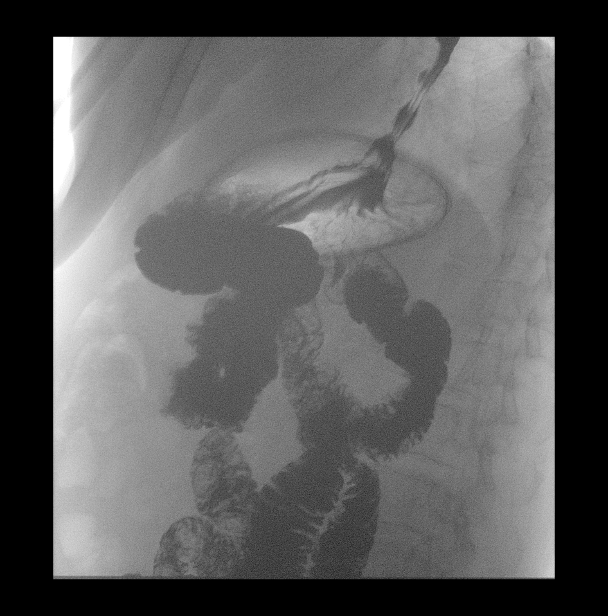

[Series 13: cp_standard · 0.29mm/px · 1 of 1 slices shown (8 of 11)]
[im 1/1]
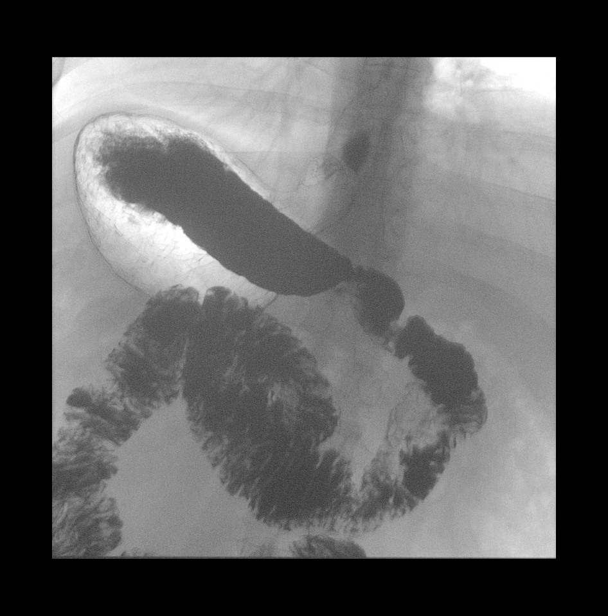

[Series 15: cp_standard · 0.27mm/px · 1 of 1 slices shown (9 of 11)]
[im 1/1]
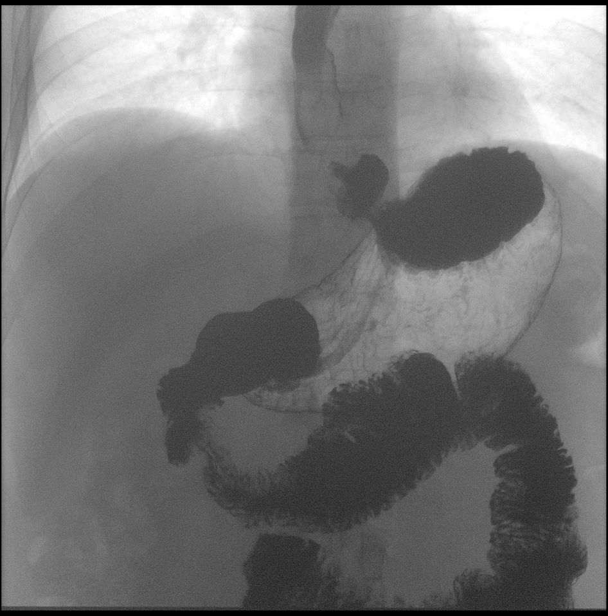

[Series 16: cp_standard · 0.27mm/px · 1 of 1 slices shown (10 of 11)]
[im 1/1]
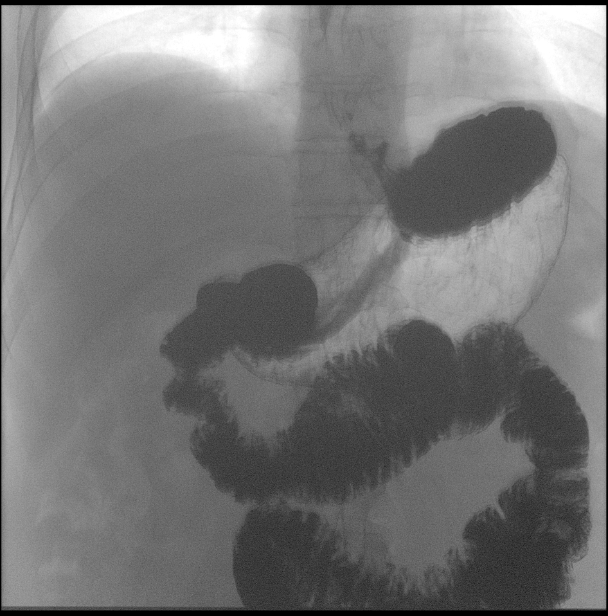

[Series 18: t abdomen barium ap · 0.15mm/px · 1 of 1 slices shown (1 of 3)]
[im 1/1]
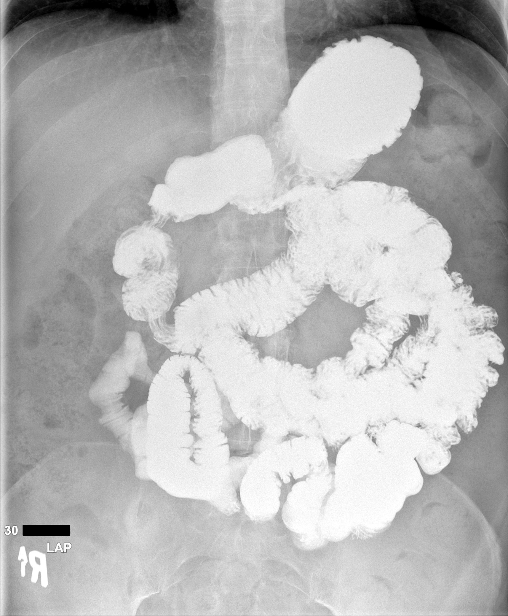

[Series 21: t abdomen barium ap · 0.15mm/px · 1 of 1 slices shown (2 of 3)]
[im 1/1]
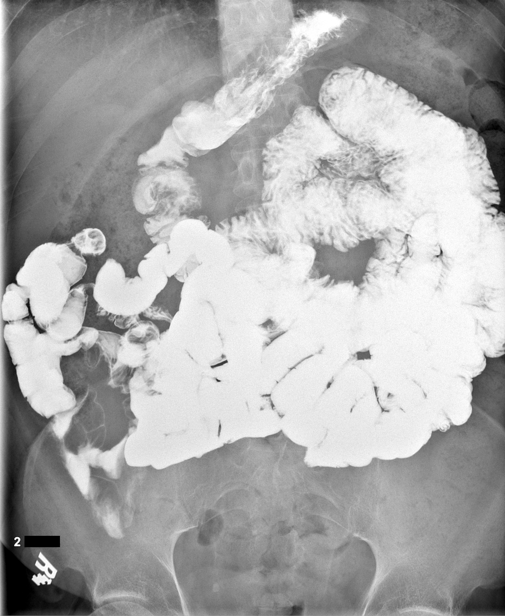

[Series 22: t abdomen barium ap · 0.15mm/px · 1 of 1 slices shown (3 of 3)]
[im 1/1]
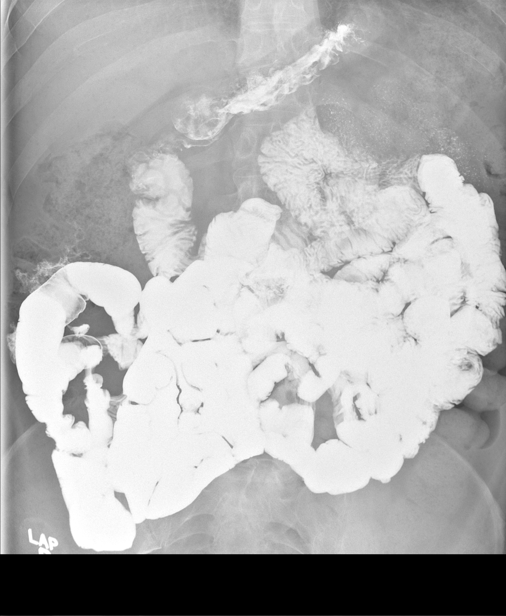

[Series 24: cp_standard · 0.27mm/px · 1 of 1 slices shown (11 of 11)]
[im 1/1]
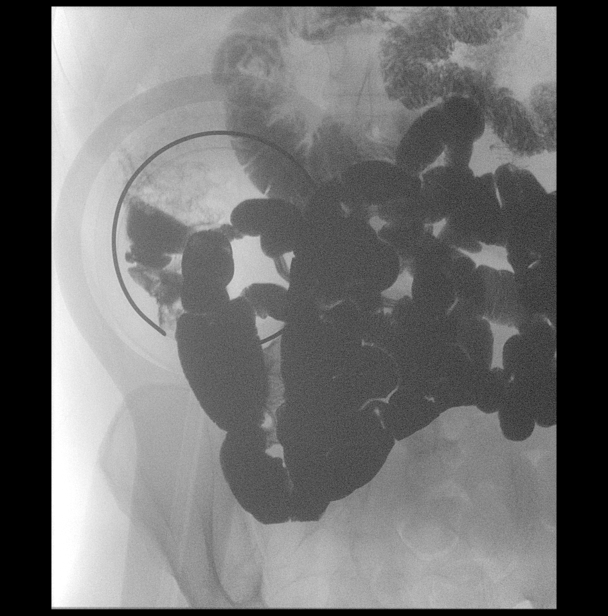

[15 of 24 positions shown; findings below may reference images not displayed]

FINDINGS: KUB:

There is a large amount of stool throughout the colon. There is no
bowel dilatation to suggest obstruction. There is no evidence of
pneumoperitoneum, portal venous gas or pneumatosis.

There are no pathologic calcifications along the expected course of
the ureters.

The osseous structures are unremarkable.

UPPER GI SMALL BOWEL FOLLOW-THROUGH:

Examination of the esophagus demonstrated normal esophageal
motility. Normal esophageal morphology without evidence of
esophagitis or ulceration. No esophageal stricture, diverticula, or
mass lesion. There is a small hiatal hernia. There is severe
gastroesophageal reflux. There is no spontaneous or inducible
gastroesophageal reflux.

Examination of the stomach demonstrated normal rugal folds and areae
gastricae. The gastric mucosa appeared unremarkable without evidence
of ulceration, scarring, or mass lesion. Gastric motility and
emptying was normal. Fluoroscopic examination of the duodenum
demonstrates normal motility and morphology without evidence of
ulceration or mass lesion.

Medium density barium was periodically observed under fluoroscopy to
travel from the stomach to the ascending colon (over a 120 minute
time period). There is no evidence of small bowel stricture or
obstruction. No large filling defects to suggest mass lesion. In
addition, there is no evidence of tethering or definite inflammatory
changes present within the small bowel.
IMPRESSION: 1. Small hiatal hernia.
2. Severe gastroesophageal reflux.
3. Otherwise normal upper GI.
4. No small bowel obstruction.  Normal small bowel follow-through.

## 2018-05-13 ENCOUNTER — Other Ambulatory Visit: Payer: Self-pay

## 2018-05-13 ENCOUNTER — Emergency Department
Admission: EM | Admit: 2018-05-13 | Discharge: 2018-05-13 | Disposition: A | Discharge status: Home / Self Care | Payer: Medicaid Other | Attending: Student in an Organized Health Care Education/Training Program | Admitting: Student in an Organized Health Care Education/Training Program

## 2018-05-13 ENCOUNTER — Encounter: Payer: Self-pay | Admitting: Emergency Medicine

## 2018-05-13 ENCOUNTER — Emergency Department: Payer: Medicaid Other

## 2018-05-13 DIAGNOSIS — Z7902 Long term (current) use of antithrombotics/antiplatelets: Secondary | ICD-10-CM | POA: Insufficient documentation

## 2018-05-13 DIAGNOSIS — F17210 Nicotine dependence, cigarettes, uncomplicated: Secondary | ICD-10-CM | POA: Insufficient documentation

## 2018-05-13 DIAGNOSIS — Z79899 Other long term (current) drug therapy: Secondary | ICD-10-CM | POA: Diagnosis not present

## 2018-05-13 DIAGNOSIS — R112 Nausea with vomiting, unspecified: Secondary | ICD-10-CM

## 2018-05-13 LAB — COMPREHENSIVE METABOLIC PANEL
ALT: 19 U/L (ref 0–44)
ANION GAP: 12 (ref 5–15)
AST: 25 U/L (ref 15–41)
Albumin: 3.9 g/dL (ref 3.5–5.0)
Alkaline Phosphatase: 88 U/L (ref 38–126)
BILIRUBIN TOTAL: 0.6 mg/dL (ref 0.3–1.2)
BUN: 6 mg/dL (ref 6–20)
CO2: 27 mmol/L (ref 22–32)
Calcium: 9.2 mg/dL (ref 8.9–10.3)
Chloride: 98 mmol/L (ref 98–111)
Creatinine, Ser: 0.73 mg/dL (ref 0.61–1.24)
Glucose, Bld: 99 mg/dL (ref 70–99)
POTASSIUM: 3.6 mmol/L (ref 3.5–5.1)
Sodium: 137 mmol/L (ref 135–145)
TOTAL PROTEIN: 7.1 g/dL (ref 6.5–8.1)

## 2018-05-13 LAB — CBC
HCT: 42.2 % (ref 39.0–52.0)
HEMOGLOBIN: 14.5 g/dL (ref 13.0–17.0)
MCH: 31 pg (ref 26.0–34.0)
MCHC: 34.4 g/dL (ref 30.0–36.0)
MCV: 90.4 fL (ref 80.0–100.0)
Platelets: 294 10*3/uL (ref 150–400)
RBC: 4.67 MIL/uL (ref 4.22–5.81)
RDW: 13.7 % (ref 11.5–15.5)
WBC: 8.2 10*3/uL (ref 4.0–10.5)
nRBC: 0 % (ref 0.0–0.2)

## 2018-05-13 LAB — LIPASE, BLOOD: Lipase: 44 U/L (ref 11–51)

## 2018-05-13 MED ORDER — SUCRALFATE 1 G PO TABS: 1.0000 g | ORAL_TABLET | Freq: Three times a day (TID) | ORAL | 0 refills | Status: DC

## 2018-05-13 MED ORDER — SUCRALFATE 1 G PO TABS
ORAL_TABLET | ORAL | Status: AC
  Filled 2018-05-13: qty 1

## 2018-05-13 MED ORDER — PROMETHAZINE HCL 25 MG/ML IJ SOLN: 12.5000 mg | Freq: Four times a day (QID) | INTRAMUSCULAR | Status: DC | PRN

## 2018-05-13 MED ORDER — SUCRALFATE 1 G PO TABS
1.0000 g | ORAL_TABLET | Freq: Once | ORAL | Status: AC
  Administered 2018-05-13: 1 g via ORAL
  Filled 2018-05-13: qty 1

## 2018-05-13 MED ORDER — PROMETHAZINE HCL 12.5 MG PO TABS: 12.5000 mg | ORAL_TABLET | Freq: Four times a day (QID) | ORAL | 0 refills | Status: DC | PRN

## 2018-05-13 NOTE — ED Notes (Signed)
Pt reports vomiting blood off and on since Friday, denies any diarrhea, pt reports his GI doctor told him to come to ED. Pt states he vomited blood and on top of the blood was brown liquid.  Pt states he has not been able to eat much or drink much. Pt is requesting to drink some gingerale at this time.

## 2018-05-13 NOTE — ED Triage Notes (Signed)
[facility] with concerns over nausea and vomiting since Friday. EMS reports HTN in route, all other vital WDL. Pt reports 2-3 episodes of emesis in the last 24 hours. Pt denies diarrhea. Pt reports "my stomach feels like it has ulcers or something in it, this has been going on for a long time." Pt in NAD

## 2018-05-13 NOTE — ED Provider Notes (Signed)
[name] Emergency Department Provider Note    First MD Initiated Contact with Patient 05/13/18 1749     (approximate)  I have reviewed the triage vital signs and the nursing notes.   HISTORY  Chief Complaint Emesis    HPI Russell Buchanan is a 42 y.o. male nausea vomiting.  This is been ongoing for quite some time.  Denies any fevers.  No chest pain.  No shortness of breath.  Does smoke.  Does not drink alcohol.  No recent sick contacts.  Has not tried anything for this at home.  States his symptoms are mild to moderate.  Nonradiating.  Denies any trouble swallowing.  States he tried applesauce which helped his sore throat from vomiting.   Past Medical History:  Diagnosis Date  . Antisocial personality disorder (HCC)   . Bipolar disorder (HCC)   . Environmental and seasonal allergies   . GERD (gastroesophageal reflux disease)   . Headache   . Hyperlipidemia   . Infected elbow (HCC)   . Schizophrenia (HCC)   . Tremors of nervous system    Family History  Family history unknown: Yes   Past Surgical History:  Procedure Laterality Date  . CLOSED REDUCTION WRIST FRACTURE Left   . HARDWARE REMOVAL Right 04/14/2018   Procedure: HARDWARE REMOVAL-RIGHT ELBOW;  Surgeon: Deeann Saint, MD;  Location: [facility] ORS;  Service: Orthopedics;  Laterality: Right;  . ORIF ELBOW FRACTURE Right   . SCALP LACERATION REPAIR    . WRIST RECONSTRUCTION Left    Patient Active Problem List   Diagnosis Date Noted  . Acute encephalopathy 12/04/2016  . GERD (gastroesophageal reflux disease) 12/04/2016  . HLD (hyperlipidemia) 12/04/2016  . Schizophrenia (HCC) 11/15/2016  . Medication side effects 11/15/2016      Prior to Admission medications   Medication Sig Start Date End Date Taking? Authorizing Provider  albuterol (PROVENTIL HFA;VENTOLIN HFA) 108 (90 Base) MCG/ACT inhaler Inhale 2 puffs into the lungs every 6 (six) hours as needed for wheezing or shortness of  breath. 01/30/17   Merrily Brittle, MD  benztropine (COGENTIN) 1 MG tablet Take 1 mg by mouth 2 (two) times daily as needed for tremors.     [provider]  buPROPion (WELLBUTRIN XL) 150 MG 24 hr tablet Take 150 mg by mouth daily.    [provider]  Butalbital-APAP-Caffeine 50-325-40 MG per capsule Take 1 capsule by mouth 2 (two) times daily as needed for headache.     [provider]  cephALEXin (KEFLEX) 500 MG capsule Take 1 capsule (500 mg total) by mouth 3 (three) times daily. 04/14/18   Deeann Saint, MD  cetirizine (ZYRTEC) 10 MG tablet Take 10 mg by mouth daily.    [provider]  clonazePAM (KLONOPIN) 1 MG tablet Take 1 mg by mouth 2 (two) times daily as needed for anxiety.    [provider]  clotrimazole-betamethasone (LOTRISONE) cream Apply 1 application topically 3 (three) times daily.    [provider]  divalproex (DEPAKOTE ER) 500 MG 24 hr tablet Take 500-1,000 mg by mouth 2 (two) times daily. Take 500 mg oral by mouth in the morning and take 1000 mg by mouth at bedtime.    [provider]  docusate sodium (COLACE) 100 MG capsule Take 100 mg by mouth daily.    [provider]  EPINEPHrine 0.3 mg/0.3 mL IJ SOAJ injection Inject 0.3 mg into the muscle as needed (allergic reactions).     [provider]  escitalopram (LEXAPRO) 20 MG tablet Take 20 mg by mouth daily.    [provider]  Fremanezumab-vfrm (AJOVY) 225 MG/1.5ML SOSY Inject into the skin every 28 (twenty-eight) days.    [provider]  gabapentin (NEURONTIN) 400 MG capsule Take 1 capsule (400 mg total) by mouth 3 (three) times daily. 04/14/18   Deeann Saint, MD  gemfibrozil (LOPID) 600 MG tablet Take 600 mg by mouth 2 (two) times daily before a meal.    [provider]  haloperidol (HALDOL) 10 MG tablet Take 10 mg by mouth 2 (two) times daily as needed (agitation).     [provider]    HYDROcodone-acetaminophen (NORCO) 5-325 MG tablet Take 1-2 tablets by mouth every 6 (six) hours as needed. 04/14/18   Deeann Saint, MD  ketoconazole (NIZORAL) 2 % cream Apply 1 application topically 2 (two) times daily.    [provider]  linaclotide (LINZESS) 72 MCG capsule Take 72 mcg by mouth every other day.    [provider]  lipase/protease/amylase (CREON) 36000 UNITS CPEP capsule Take 36,000 Units by mouth 3 (three) times daily with meals. 2 caps withmeals and 1 with snacks    [provider]  lipase/protease/amylase (CREON) 36000 UNITS CPEP capsule Take 36,000 Units by mouth. With snacks    [provider]  LORazepam (ATIVAN) 1 MG tablet Take 1 mg by mouth every 8 (eight) hours as needed for anxiety.    [provider]  magnesium gluconate (MAGONATE) 500 MG tablet Take 500 mg by mouth daily.    [provider]  meloxicam (MOBIC) 15 MG tablet Take 1 tablet (15 mg total) by mouth daily. 04/14/18   Deeann Saint, MD  nystatin cream (MYCOSTATIN) Apply 1 application topically 2 (two) times daily as needed for dry skin.     [provider]  ondansetron (ZOFRAN ODT) 4 MG disintegrating tablet Take 1 tablet (4 mg total) by mouth every 8 (eight) hours as needed for nausea or vomiting. 12/23/16   [name], MD  Paliperidone Palmitate ER (INVEGA TRINZA) 819 MG/2.625ML SUSY Inject into the muscle every 3 (three) months.    [provider]  pantoprazole (PROTONIX) 40 MG tablet Take 40 mg by mouth 2 (two) times daily.     [provider]  polyethylene glycol (MIRALAX / GLYCOLAX) packet Take 17 g by mouth daily.    [provider]  pravastatin (PRAVACHOL) 20 MG tablet Take 20 mg by mouth at bedtime.    [provider]  propranolol (INNOPRAN XL) 80 MG 24 hr capsule Take 80 mg by mouth daily.    [provider]  QUEtiapine (SEROQUEL XR) 400 MG 24 hr tablet Take 800 mg by mouth at bedtime.     [provider]  sucralfate (CARAFATE) 1 g tablet Take 1 g by mouth 4 (four) times daily -  with meals and at bedtime.    [provider]  Sulfamethoxazole-Trimethoprim (BACTRIM DS PO) Take by mouth every 12 (twelve) hours.    [provider]  sulfamethoxazole-trimethoprim (BACTRIM DS,SEPTRA DS) 800-160 MG tablet Take 1 tablet by mouth 2 (two) times daily. 04/14/18   Deeann Saint, MD  SUMAtriptan (IMITREX) 100 MG tablet Take 100 mg by mouth every 2 (two) hours as needed for migraine. May repeat in 2 hours if headache persists or recurs.    [provider]  tiotropium (SPIRIVA) 18 MCG inhalation capsule Place 18 mcg into inhaler and inhale daily.    [provider]  Allergies Bee venom    Social History Social History   Tobacco Use  . Smoking status: Current Every Day Smoker    Packs/day: 0.50    Years: 7.00    Pack years: 3.50    Types: Cigarettes  . Smokeless tobacco: Former Neurosurgeon    Types: Chew, Snuff  Substance Use Topics  . Alcohol use: No  . Drug use: No    Review of Systems Patient denies headaches, rhinorrhea, blurry vision, numbness, shortness of breath, chest pain, edema, cough, abdominal pain, nausea, vomiting, diarrhea, dysuria, fevers, rashes or hallucinations unless otherwise stated above in HPI. ____________________________________________   PHYSICAL EXAM:  VITAL SIGNS: Vitals:   05/13/18 1640  BP: (!) 143/91  Pulse: 83  Resp: 18  Temp: 98.3 F (36.8 C)  SpO2: 97%    Constitutional: Alert and oriented.  Eyes: Conjunctivae are normal.  Head: Atraumatic. Nose: No congestion/rhinnorhea. Mouth/Throat: Mucous membranes are moist.   Neck: No stridor. Painless ROM.  Cardiovascular: Normal rate, regular rhythm. Grossly normal heart sounds.  Good peripheral circulation. Respiratory: Normal respiratory effort.  No retractions. Lungs CTAB. Gastrointestinal: Soft and nontender. No distention. No abdominal bruits.  No CVA tenderness. Genitourinary:  Musculoskeletal: No lower extremity tenderness nor edema.  No joint effusions. Neurologic:  Normal speech and language. No gross focal neurologic deficits are appreciated. No facial droop Skin:  Skin is warm, dry and intact. No rash noted. Psychiatric: Speech and behavior are normal.  ____________________________________________   LABS (all labs ordered are listed, but only abnormal results are displayed)  Results for orders placed or performed during the hospital encounter of 05/13/18 (from the past 24 hour(s))  Lipase, blood     Status: None   Collection Time: 05/13/18  4:40 PM  Result Value Ref Range   Lipase 44 11 - 51 U/L  Comprehensive metabolic panel     Status: None   Collection Time: 05/13/18  4:40 PM  Result Value Ref Range   Sodium 137 135 - 145 mmol/L   Potassium 3.6 3.5 - 5.1 mmol/L   Chloride 98 98 - 111 mmol/L   CO2 27 22 - 32 mmol/L   Glucose, Bld 99 70 - 99 mg/dL   BUN 6 6 - 20 mg/dL   Creatinine, Ser 6.04 0.61 - 1.24 mg/dL   Calcium 9.2 8.9 - 54.0 mg/dL   Total Protein 7.1 6.5 - 8.1 g/dL   Albumin 3.9 3.5 - 5.0 g/dL   AST 25 15 - 41 U/L   ALT 19 0 - 44 U/L   Alkaline Phosphatase 88 38 - 126 U/L   Total Bilirubin 0.6 0.3 - 1.2 mg/dL   GFR calc non Af Amer >60 >60 mL/min   GFR calc Af Amer >60 >60 mL/min   Anion gap 12 5 - 15  CBC     Status: None   Collection Time: 05/13/18  4:40 PM  Result Value Ref Range   WBC 8.2 4.0 - 10.5 K/uL   RBC 4.67 4.22 - 5.81 MIL/uL   Hemoglobin 14.5 13.0 - 17.0 g/dL   HCT 98.1 19.1 - 47.8 %   MCV 90.4 80.0 - 100.0 fL   MCH 31.0 26.0 - 34.0 pg   MCHC 34.4 30.0 - 36.0 g/dL   RDW 29.5 62.1 - 30.8 %   Platelets 294 150 - 400 K/uL   nRBC 0.0 0.0 - 0.2 %   ____________________________________________ __________________________________  RADIOLOGY  I personally reviewed all radiographic images ordered to evaluate for the above  acute complaints and reviewed radiology reports and findings.   These findings were personally discussed with the patient.  Please see medical record for radiology report.  ____________________________________________   PROCEDURES  Procedure(s) performed:  Procedures    Critical Care performed: no ____________________________________________   INITIAL IMPRESSION / ASSESSMENT AND PLAN / ED COURSE  Pertinent labs & imaging results that were available during my care of the patient were reviewed by me and considered in my medical decision making (see chart for details).   DDX: Gastritis, esophagitis, pancreatitis, cholelithiasis, cholecystitis  Tige Meas is a 43 y.o. who presents to the ED with symptoms as described above.  Patient well-appearing in no acute distress.  Abdominal exam soft and benign.  Chest x-ray  reassuring.  Blood work is reassuring.  Patient with improvement in symptoms after Carafate.  Tolerating oral hydration.  Will treat for gastritis.  Will give antiemetic.  Discussed signs symptoms for which she should return to the ER.     As part of my medical decision making, I reviewed the following data within the electronic MEDICAL RECORD NUMBER Nursing notes reviewed and incorporated, Labs reviewed, notes from prior ED visits.   ____________________________________________   FINAL CLINICAL IMPRESSION(S) / ED DIAGNOSES  Final diagnoses:  Non-intractable vomiting with nausea, unspecified vomiting type      NEW MEDICATIONS STARTED DURING THIS VISIT:  New Prescriptions   No medications on file     Note:  This document was prepared using Dragon voice recognition software and may include unintentional dictation errors.    Willy Eddy, MD 05/13/18 [phone]

## 2018-06-25 ENCOUNTER — Emergency Department: Payer: Medicaid Other

## 2018-06-25 ENCOUNTER — Other Ambulatory Visit: Payer: Self-pay

## 2018-06-25 ENCOUNTER — Emergency Department
Admission: EM | Admit: 2018-06-25 | Discharge: 2018-06-25 | Disposition: A | Discharge status: Home / Self Care | Payer: Medicaid Other | Attending: Emergency Medicine | Admitting: Emergency Medicine

## 2018-06-25 DIAGNOSIS — F17210 Nicotine dependence, cigarettes, uncomplicated: Secondary | ICD-10-CM | POA: Insufficient documentation

## 2018-06-25 DIAGNOSIS — R600 Localized edema: Secondary | ICD-10-CM | POA: Insufficient documentation

## 2018-06-25 DIAGNOSIS — Z79899 Other long term (current) drug therapy: Secondary | ICD-10-CM | POA: Diagnosis not present

## 2018-06-25 DIAGNOSIS — R609 Edema, unspecified: Secondary | ICD-10-CM

## 2018-06-25 LAB — COMPREHENSIVE METABOLIC PANEL
ALT: 11 U/L (ref 0–44)
AST: 16 U/L (ref 15–41)
Albumin: 4 g/dL (ref 3.5–5.0)
Alkaline Phosphatase: 147 U/L — ABNORMAL HIGH (ref 38–126)
Anion gap: 8 (ref 5–15)
BUN: <5 mg/dL — ABNORMAL LOW (ref 6–20)
CHLORIDE: 95 mmol/L — AB (ref 98–111)
CO2: 30 mmol/L (ref 22–32)
Calcium: 9 mg/dL (ref 8.9–10.3)
Creatinine, Ser: 0.79 mg/dL (ref 0.61–1.24)
Glucose, Bld: 96 mg/dL (ref 70–99)
POTASSIUM: 4 mmol/L (ref 3.5–5.1)
Sodium: 133 mmol/L — ABNORMAL LOW (ref 135–145)
Total Bilirubin: 0.5 mg/dL (ref 0.3–1.2)
Total Protein: 6.9 g/dL (ref 6.5–8.1)

## 2018-06-25 LAB — CBC
HCT: 42.8 % (ref 39.0–52.0)
Hemoglobin: 14.3 g/dL (ref 13.0–17.0)
MCH: 30.5 pg (ref 26.0–34.0)
MCHC: 33.4 g/dL (ref 30.0–36.0)
MCV: 91.3 fL (ref 80.0–100.0)
PLATELETS: 247 10*3/uL (ref 150–400)
RBC: 4.69 MIL/uL (ref 4.22–5.81)
RDW: 13.2 % (ref 11.5–15.5)
WBC: 6 10*3/uL (ref 4.0–10.5)
nRBC: 0 % (ref 0.0–0.2)

## 2018-06-25 LAB — BRAIN NATRIURETIC PEPTIDE: B NATRIURETIC PEPTIDE 5: 62 pg/mL (ref 0.0–100.0)

## 2018-06-25 LAB — TROPONIN I: TROPONIN I: 0.03 ng/mL — AB (ref <0.03)

## 2018-06-25 MED ORDER — FUROSEMIDE 40 MG PO TABS: 40.0000 mg | ORAL_TABLET | Freq: Every day | ORAL | 0 refills | Status: DC

## 2018-06-25 MED ORDER — FUROSEMIDE 40 MG PO TABS: 40.0000 mg | ORAL_TABLET | Freq: Every day | ORAL | 11 refills | Status: DC

## 2018-06-25 NOTE — ED Notes (Signed)
Resumed care from alicia rn.  Pt alert.    

## 2018-06-25 NOTE — Discharge Instructions (Signed)
Please call the number provided to arrange follow-up with cardiology for further evaluation and work-up.  Please take your diuretic medication as prescribed.  Return to the emergency department for any chest pain, any trouble breathing, continued worsening swelling or pain in your legs, or any other symptom personally concerning to yourself.

## 2018-06-25 NOTE — ED Triage Notes (Signed)
Pt arrived via EMS from [facility] with c/o bilateral swelling in the legs. EMS reports pt and facility deny hx of CHF. Pt does have hx of schizophrenia.

## 2018-06-25 NOTE — ED Provider Notes (Addendum)
[facility] Emergency Department Provider Note  Time seen: 3:06 PM  I have reviewed the triage vital signs and the nursing notes.   HISTORY  Chief Complaint Leg Swelling    HPI Russell Buchanan is a 43 y.o. male with a past medical history of bipolar disorder, schizophrenia, presents to the emergency department from his group home for lower extremity edema reportedly new onset.  According to EMS report and patient report patient found to have new onset bilateral lower extremity edema over the past 1 week.  Denies any history of the same.  Patient denies any chest pain or shortness of breath.  Otherwise appears very well.   Past Medical History:  Diagnosis Date  . Antisocial personality disorder (HCC)   . Bipolar disorder (HCC)   . Environmental and seasonal allergies   . GERD (gastroesophageal reflux disease)   . Headache   . Hyperlipidemia   . Infected elbow (HCC)   . Schizophrenia (HCC)   . Tremors of nervous system     Patient Active Problem List   Diagnosis Date Noted  . Acute encephalopathy 12/04/2016  . GERD (gastroesophageal reflux disease) 12/04/2016  . HLD (hyperlipidemia) 12/04/2016  . Schizophrenia (HCC) 11/15/2016  . Medication side effects 11/15/2016    Past Surgical History:  Procedure Laterality Date  . CLOSED REDUCTION WRIST FRACTURE Left   . HARDWARE REMOVAL Right 04/14/2018   Procedure: HARDWARE REMOVAL-RIGHT ELBOW;  Surgeon: Deeann SaintMiller, Howard, MD;  Location: [facility] ORS;  Service: Orthopedics;  Laterality: Right;  . ORIF ELBOW FRACTURE Right   . SCALP LACERATION REPAIR    . WRIST RECONSTRUCTION Left     Prior to Admission medications   Medication Sig Start Date End Date Taking? Authorizing Provider  albuterol (PROVENTIL HFA;VENTOLIN HFA) 108 (90 Base) MCG/ACT inhaler Inhale 2 puffs into the lungs every 6 (six) hours as needed for wheezing or shortness of breath. 01/30/17   Merrily Brittleifenbark, Neil, MD  benztropine (COGENTIN) 1 MG tablet  Take 1 mg by mouth 2 (two) times daily as needed for tremors.     [provider]  buPROPion (WELLBUTRIN XL) 150 MG 24 hr tablet Take 150 mg by mouth daily.    [provider]  Butalbital-APAP-Caffeine 50-325-40 MG per capsule Take 1 capsule by mouth 2 (two) times daily as needed for headache.     [provider]  cephALEXin (KEFLEX) 500 MG capsule Take 1 capsule (500 mg total) by mouth 3 (three) times daily. 04/14/18   Deeann SaintMiller, Howard, MD  cetirizine (ZYRTEC) 10 MG tablet Take 10 mg by mouth daily.    [provider]  clonazePAM (KLONOPIN) 1 MG tablet Take 1 mg by mouth 2 (two) times daily as needed for anxiety.    [provider]  clotrimazole-betamethasone (LOTRISONE) cream Apply 1 application topically 3 (three) times daily.    [provider]  divalproex (DEPAKOTE ER) 500 MG 24 hr tablet Take 500-1,000 mg by mouth 2 (two) times daily. Take 500 mg oral by mouth in the morning and take 1000 mg by mouth at bedtime.    [provider]  docusate sodium (COLACE) 100 MG capsule Take 100 mg by mouth daily.    [provider]  EPINEPHrine 0.3 mg/0.3 mL IJ SOAJ injection Inject 0.3 mg into the muscle as needed (allergic reactions).     [provider]  escitalopram (LEXAPRO) 20 MG tablet Take 20 mg by mouth daily.    [provider]  Fremanezumab-vfrm (AJOVY) 225 MG/1.5ML SOSY  Inject into the skin every 28 (twenty-eight) days.    [provider]  gabapentin (NEURONTIN) 400 MG capsule Take 1 capsule (400 mg total) by mouth 3 (three) times daily. 04/14/18   Deeann Saint, MD  gemfibrozil (LOPID) 600 MG tablet Take 600 mg by mouth 2 (two) times daily before a meal.    [provider]  haloperidol (HALDOL) 10 MG tablet Take 10 mg by mouth 2 (two) times daily as needed (agitation).     [provider]  HYDROcodone-acetaminophen (NORCO) 5-325 MG tablet Take 1-2 tablets by mouth every 6 (six) hours  as needed. 04/14/18   Deeann Saint, MD  ketoconazole (NIZORAL) 2 % cream Apply 1 application topically 2 (two) times daily.    [provider]  linaclotide (LINZESS) 72 MCG capsule Take 72 mcg by mouth every other day.    [provider]  lipase/protease/amylase (CREON) 36000 UNITS CPEP capsule Take 36,000 Units by mouth 3 (three) times daily with meals. 2 caps withmeals and 1 with snacks    [provider]  lipase/protease/amylase (CREON) 36000 UNITS CPEP capsule Take 36,000 Units by mouth. With snacks    [provider]  LORazepam (ATIVAN) 1 MG tablet Take 1 mg by mouth every 8 (eight) hours as needed for anxiety.    [provider]  magnesium gluconate (MAGONATE) 500 MG tablet Take 500 mg by mouth daily.    [provider]  meloxicam (MOBIC) 15 MG tablet Take 1 tablet (15 mg total) by mouth daily. 04/14/18   Deeann Saint, MD  nystatin cream (MYCOSTATIN) Apply 1 application topically 2 (two) times daily as needed for dry skin.     [provider]  ondansetron (ZOFRAN ODT) 4 MG disintegrating tablet Take 1 tablet (4 mg total) by mouth every 8 (eight) hours as needed for nausea or vomiting. 12/23/16   [name], MD  Paliperidone Palmitate ER (INVEGA TRINZA) 819 MG/2.625ML SUSY Inject into the muscle every 3 (three) months.    [provider]  pantoprazole (PROTONIX) 40 MG tablet Take 40 mg by mouth 2 (two) times daily.     [provider]  polyethylene glycol (MIRALAX / GLYCOLAX) packet Take 17 g by mouth daily.    [provider]  pravastatin (PRAVACHOL) 20 MG tablet Take 20 mg by mouth at bedtime.    [provider]  promethazine (PHENERGAN) 12.5 MG tablet Take 1 tablet (12.5 mg total) by mouth every 6 (six) hours as needed for nausea or vomiting. 05/13/18   Willy Eddy, MD  propranolol (INNOPRAN XL) 80 MG 24 hr capsule Take 80 mg by mouth daily.    [provider]  QUEtiapine  (SEROQUEL XR) 400 MG 24 hr tablet Take 800 mg by mouth at bedtime.    [provider]  sucralfate (CARAFATE) 1 g tablet Take 1 tablet (1 g total) by mouth 4 (four) times daily -  with meals and at bedtime. 05/13/18   Willy Eddy, MD  Sulfamethoxazole-Trimethoprim (BACTRIM DS PO) Take by mouth every 12 (twelve) hours.    [provider]  sulfamethoxazole-trimethoprim (BACTRIM DS,SEPTRA DS) 800-160 MG tablet Take 1 tablet by mouth 2 (two) times daily. 04/14/18   Deeann Saint, MD  SUMAtriptan (IMITREX) 100 MG tablet Take 100 mg by mouth every 2 (two) hours as needed for migraine. May repeat in 2 hours if headache persists or recurs.    [provider]  tiotropium (SPIRIVA) 18 MCG inhalation capsule Place 18 mcg into inhaler  and inhale daily.    [provider]    Allergies  Allergen Reactions  . Bee Venom Hives    Family History  Family history unknown: Yes    Social History Social History   Tobacco Use  . Smoking status: Current Every Day Smoker    Packs/day: 0.50    Years: 7.00    Pack years: 3.50    Types: Cigarettes  . Smokeless tobacco: Former NeurosurgeonUser    Types: Chew, Snuff  Substance Use Topics  . Alcohol use: No  . Drug use: No    Review of Systems Constitutional: Negative for fever. Cardiovascular: Negative for chest pain. Respiratory: Negative for shortness of breath. Gastrointestinal: Negative for abdominal pain Musculoskeletal: Bilateral lower extremity swelling. Skin: Negative for skin complaints  Neurological: Negative for headache All other ROS negative  ____________________________________________   PHYSICAL EXAM:  VITAL SIGNS: ED Triage Vitals  Enc Vitals Group     BP 06/25/18 1334 (!) 140/97     Pulse Rate 06/25/18 1334 71     Resp 06/25/18 1334 18     Temp 06/25/18 1334 98.4 F (36.9 C)     Temp Source 06/25/18 1334 Oral     SpO2 06/25/18 1334 100 %     Weight 06/25/18 1335 226 lb (102.5 kg)     Height  06/25/18 1335 5\' 11"  (1.803 m)     Head Circumference --      Peak Flow --      Pain Score 06/25/18 1334 0     Pain Loc --      Pain Edu? --      Excl. in GC? --    Constitutional: Alert and oriented. Well appearing and in no distress. Eyes: Normal exam ENT   Head: Normocephalic and atraumatic.   Mouth/Throat: Mucous membranes are moist. Cardiovascular: Normal rate, regular rhythm.  No obvious murmur rub or gallop. Respiratory: Normal respiratory effort without tachypnea nor retractions. Breath sounds are clear, without wheeze rales or rhonchi. Gastrointestinal: Soft and nontender. No distention.   Musculoskeletal: 3+ lower extremity edema bilaterally, pitting, calves are largely nontender. Neurologic:  Normal speech and language. No gross focal neurologic deficits Skin:  Skin is warm, dry and intact.  Psychiatric: Mood and affect are normal.   ____________________________________________    EKG  EKG viewed and interpreted by myself shows normal sinus rhythm at 73 bpm with a narrow QRS, normal axis, normal intervals, no concerning ST changes.  ____________________________________________    RADIOLOGY  Chest x-ray negative  ____________________________________________   INITIAL IMPRESSION / ASSESSMENT AND PLAN / ED COURSE  Pertinent labs & imaging results that were available during my care of the patient were reviewed by me and considered in my medical decision making (see chart for details).  Patient presents to the emergency department for evaluation of new onset lower extremity edema over the past 1 week.  Patient currently has 3+ pitting edema bilaterally, nontender to palpation.  No significant erythema.  Patient denies any recent chest pain or shortness of breath.  Overall appears very well.  Patient's lab work has resulted largely within normal limits, negative troponin of 0.03, chest x-ray is normal.  We will dose Lasix, start the patient on outpatient Lasix.   I will discuss with cardiology to arrange for outpatient follow-up as the patient will need an echocardiogram.  I also discussed return precautions for any chest pain shortness of breath or continued worsening of lower extremity edema.  Patient agreeable plan of care.  I discussed the patient with Dr. Gwen Pounds, given a normal BNP, troponin with a clear chest x-ray and otherwise, I believe the patient safe for discharge home, Dr. Gwen Pounds will see in the office for an echocardiogram.  ____________________________________________   FINAL CLINICAL IMPRESSION(S) / ED DIAGNOSES  Peripheral edema   Minna Antis, MD 06/25/18 1511    Minna Antis, MD 06/25/18 1530

## 2018-09-11 ENCOUNTER — Other Ambulatory Visit: Payer: Self-pay | Admitting: Student

## 2018-09-11 DIAGNOSIS — K5909 Other constipation: Secondary | ICD-10-CM

## 2018-09-12 ENCOUNTER — Ambulatory Visit
Admission: RE | Admit: 2018-09-12 | Discharge: 2018-09-12 | Disposition: A | Discharge status: Home / Self Care | Payer: Medicaid Other | Source: Ambulatory Visit | Attending: Student | Admitting: Student

## 2018-09-12 ENCOUNTER — Other Ambulatory Visit: Payer: Self-pay

## 2018-09-12 ENCOUNTER — Ambulatory Visit
Admission: RE | Admit: 2018-09-12 | Discharge: 2018-09-12 | Disposition: A | Discharge status: Home / Self Care | Payer: Medicaid Other | Source: Ambulatory Visit | Attending: Orthopedic Surgery | Admitting: Orthopedic Surgery

## 2018-09-12 DIAGNOSIS — K5909 Other constipation: Secondary | ICD-10-CM

## 2019-05-28 ENCOUNTER — Emergency Department
Admission: EM | Admit: 2019-05-28 | Discharge: 2019-05-28 | Disposition: A | Discharge status: Home / Self Care | Payer: Medicaid Other | Attending: Emergency Medicine | Admitting: Emergency Medicine

## 2019-05-28 ENCOUNTER — Encounter: Payer: Self-pay | Admitting: Intensive Care

## 2019-05-28 ENCOUNTER — Emergency Department: Payer: Medicaid Other

## 2019-05-28 ENCOUNTER — Other Ambulatory Visit: Payer: Self-pay

## 2019-05-28 DIAGNOSIS — Z79899 Other long term (current) drug therapy: Secondary | ICD-10-CM | POA: Diagnosis not present

## 2019-05-28 DIAGNOSIS — R531 Weakness: Secondary | ICD-10-CM | POA: Diagnosis present

## 2019-05-28 DIAGNOSIS — R8290 Unspecified abnormal findings in urine: Secondary | ICD-10-CM | POA: Insufficient documentation

## 2019-05-28 DIAGNOSIS — F17210 Nicotine dependence, cigarettes, uncomplicated: Secondary | ICD-10-CM | POA: Diagnosis not present

## 2019-05-28 LAB — CBC WITH DIFFERENTIAL/PLATELET
Abs Immature Granulocytes: 0.03 10*3/uL (ref 0.00–0.07)
Basophils Absolute: 0 10*3/uL (ref 0.0–0.1)
Basophils Relative: 0 %
Eosinophils Absolute: 0 10*3/uL (ref 0.0–0.5)
Eosinophils Relative: 0 %
HCT: 42 % (ref 39.0–52.0)
Hemoglobin: 15 g/dL (ref 13.0–17.0)
Immature Granulocytes: 0 %
Lymphocytes Relative: 22 %
Lymphs Abs: 1.7 10*3/uL (ref 0.7–4.0)
MCH: 29.2 pg (ref 26.0–34.0)
MCHC: 35.7 g/dL (ref 30.0–36.0)
MCV: 81.7 fL (ref 80.0–100.0)
Monocytes Absolute: 0.9 10*3/uL (ref 0.1–1.0)
Monocytes Relative: 12 %
Neutro Abs: 4.8 10*3/uL (ref 1.7–7.7)
Neutrophils Relative %: 66 %
Platelets: 189 10*3/uL (ref 150–400)
RBC: 5.14 MIL/uL (ref 4.22–5.81)
RDW: 13.3 % (ref 11.5–15.5)
WBC: 7.5 10*3/uL (ref 4.0–10.5)
nRBC: 0 % (ref 0.0–0.2)

## 2019-05-28 LAB — COMPREHENSIVE METABOLIC PANEL
ALT: 19 U/L (ref 0–44)
AST: 46 U/L — ABNORMAL HIGH (ref 15–41)
Albumin: 3.9 g/dL (ref 3.5–5.0)
Alkaline Phosphatase: 96 U/L (ref 38–126)
Anion gap: 12 (ref 5–15)
BUN: 9 mg/dL (ref 6–20)
CO2: 23 mmol/L (ref 22–32)
Calcium: 9 mg/dL (ref 8.9–10.3)
Chloride: 101 mmol/L (ref 98–111)
Creatinine, Ser: 0.64 mg/dL (ref 0.61–1.24)
GFR calc Af Amer: >60 mL/min (ref >60)
GFR calc non Af Amer: >60 mL/min (ref >60)
Glucose, Bld: 103 mg/dL — ABNORMAL HIGH (ref 70–99)
Potassium: 4.2 mmol/L (ref 3.5–5.1)
Sodium: 136 mmol/L (ref 135–145)
Total Bilirubin: 0.6 mg/dL (ref 0.3–1.2)
Total Protein: 7.1 g/dL (ref 6.5–8.1)

## 2019-05-28 LAB — ETHANOL: Alcohol, Ethyl (B): <10 mg/dL (ref <10)

## 2019-05-28 LAB — URINE DRUG SCREEN, QUALITATIVE (ARMC ONLY)
Amphetamines, Ur Screen: NOT DETECTED
Barbiturates, Ur Screen: NOT DETECTED
Benzodiazepine, Ur Scrn: NOT DETECTED
Cannabinoid 50 Ng, Ur ~~LOC~~: NOT DETECTED
Cocaine Metabolite,Ur ~~LOC~~: NOT DETECTED
MDMA (Ecstasy)Ur Screen: NOT DETECTED
Methadone Scn, Ur: NOT DETECTED
Opiate, Ur Screen: NOT DETECTED
Phencyclidine (PCP) Ur S: NOT DETECTED
Tricyclic, Ur Screen: POSITIVE — AB

## 2019-05-28 LAB — URINALYSIS, COMPLETE (UACMP) WITH MICROSCOPIC
Bilirubin Urine: NEGATIVE
Glucose, UA: NEGATIVE mg/dL
Hgb urine dipstick: NEGATIVE
Ketones, ur: 5 mg/dL — AB
Leukocytes,Ua: NEGATIVE
Nitrite: NEGATIVE
Protein, ur: 30 mg/dL — AB
Specific Gravity, Urine: 1.017 (ref 1.005–1.030)
Squamous Epithelial / HPF: NONE SEEN (ref 0–5)
pH: 7 (ref 5.0–8.0)

## 2019-05-28 LAB — VALPROIC ACID LEVEL: Valproic Acid Lvl: 62 ug/mL (ref 50.0–100.0)

## 2019-05-28 NOTE — ED Notes (Addendum)
Pt stated to this RN "I dootied my pants." this RN finding assistance to help clean up patient at this time. Patient arrived in soiled undwerwear and pants from stool.

## 2019-05-28 NOTE — ED Notes (Signed)
Patient transported to CT 

## 2019-05-28 NOTE — ED Provider Notes (Signed)
[facility] Emergency Department Provider Note       Time seen: ----------------------------------------- 9:56 AM on 05/28/2019 -----------------------------------------   I have reviewed the triage vital signs and the nursing notes.  HISTORY   Chief Complaint No chief complaint on file.    HPI Russell Buchanan is a 43 y.o. male with a history of antisocial personality, bipolar, GERD, hyperlipidemia, schizophrenia who presents to the ED for generalized weakness.  Patient lives in a group home, reportedly has been taking his medications.  He reports diffuse weakness, denies any sickness.  He has been drinking and eating normally he states.  Past Medical History:  Diagnosis Date  . Antisocial personality disorder (HCC)   . Bipolar disorder (HCC)   . Environmental and seasonal allergies   . GERD (gastroesophageal reflux disease)   . Headache   . Hyperlipidemia   . Infected elbow (HCC)   . Schizophrenia (HCC)   . Tremors of nervous system     Patient Active Problem List   Diagnosis Date Noted  . Acute encephalopathy 12/04/2016  . GERD (gastroesophageal reflux disease) 12/04/2016  . HLD (hyperlipidemia) 12/04/2016  . Schizophrenia (HCC) 11/15/2016  . Medication side effects 11/15/2016    Past Surgical History:  Procedure Laterality Date  . CLOSED REDUCTION WRIST FRACTURE Left   . HARDWARE REMOVAL Right 04/14/2018   Procedure: HARDWARE REMOVAL-RIGHT ELBOW;  Surgeon: Deeann Saint, MD;  Location: [facility] ORS;  Service: Orthopedics;  Laterality: Right;  . ORIF ELBOW FRACTURE Right   . SCALP LACERATION REPAIR    . WRIST RECONSTRUCTION Left     Allergies Bee venom  Social History Social History   Tobacco Use  . Smoking status: Current Every Day Smoker    Packs/day: 0.50    Years: 7.00    Pack years: 3.50    Types: Cigarettes  . Smokeless tobacco: Former Neurosurgeon    Types: Chew, Snuff  Substance Use Topics  . Alcohol use: No  . Drug use: No    Review of Systems Constitutional: Negative for fever. Cardiovascular: Negative for chest pain. Respiratory: Negative for shortness of breath. Gastrointestinal: Negative for abdominal pain, vomiting and diarrhea. Musculoskeletal: Negative for back pain. Skin: Negative for rash. Neurological: Positive for generalized weakness  All systems negative/normal/unremarkable except as stated in the HPI  ____________________________________________   PHYSICAL EXAM:  VITAL SIGNS: ED Triage Vitals  Enc Vitals Group     BP      Pulse      Resp      Temp      Temp src      SpO2      Weight      Height      Head Circumference      Peak Flow      Pain Score      Pain Loc      Pain Edu?      Excl. in GC?     Constitutional: Alert and oriented.  Lethargic, no distress Eyes: Conjunctivae are normal. Normal extraocular movements. ENT      Head: Normocephalic and atraumatic.      Nose: No congestion/rhinnorhea.      Mouth/Throat: Mucous membranes are moist.      Neck: No stridor. Cardiovascular: Normal rate, regular rhythm. No murmurs, rubs, or gallops. Respiratory: Normal respiratory effort without tachypnea nor retractions. Breath sounds are clear and equal bilaterally. No wheezes/rales/rhonchi. Gastrointestinal: Soft and nontender. Normal bowel sounds Musculoskeletal: Nontender with normal range of motion in extremities. No  lower extremity tenderness nor edema. Neurologic:  Normal speech and language. No gross focal neurologic deficits are appreciated.  Skin:  Skin is warm, dry and intact. No rash noted. Psychiatric: Flat affect ____________________________________________  EKG: Interpreted by me.  Sinus rhythm rate of 66 bpm, normal PR interval, normal QRS, normal QT  ____________________________________________  ED COURSE:  As part of my medical decision making, I reviewed the following data within the [facility] History obtained from family if available,  nursing notes, old chart and ekg, as well as notes from prior ED visits. Patient presented for generalized weakness, we will assess with labs and imaging as indicated at this time.   Procedures  Russell Buchanan was evaluated in Emergency Department on 05/28/2019 for the symptoms described in the history of present illness. He was evaluated in the context of the global COVID-19 pandemic, which necessitated consideration that the patient might be at risk for infection with the SARS-CoV-2 virus that causes COVID-19. Institutional protocols and algorithms that pertain to the evaluation of patients at risk for COVID-19 are in a state of rapid change based on information released by regulatory bodies including the CDC and federal and state organizations. These policies and algorithms were followed during the patient's care in the ED.  ____________________________________________   LABS (pertinent positives/negatives)  Labs Reviewed  COMPREHENSIVE METABOLIC PANEL - Abnormal; Notable for the following components:      Result Value   Glucose, Bld 103 (*)    AST 46 (*)    All other components within normal limits  URINALYSIS, COMPLETE (UACMP) WITH MICROSCOPIC - Abnormal; Notable for the following components:   Color, Urine YELLOW (*)    APPearance CLEAR (*)    Ketones, ur 5 (*)    Protein, ur 30 (*)    Bacteria, UA RARE (*)    All other components within normal limits  URINE DRUG SCREEN, QUALITATIVE ([facility] ONLY) - Abnormal; Notable for the following components:   Tricyclic, Ur Screen POSITIVE (*)    All other components within normal limits  CBC WITH DIFFERENTIAL/PLATELET  ETHANOL  VALPROIC ACID LEVEL    RADIOLOGY Images were viewed by me  CT head IMPRESSION: No acute intracranial abnormality. ____________________________________________   DIFFERENTIAL DIAGNOSIS   Medication side effect, schizophrenia, electrolyte abnormality, dehydration, intoxication, CVA  FINAL ASSESSMENT AND  PLAN  Weakness   Plan: The patient had presented for generalized weakness. Patient's labs are unremarkable. Patient's imaging did not reveal any acute process.  Weakness is likely related to his mental health issues as I cannot find any other etiology.  Depakote levels are normal.  He appears cleared for outpatient follow-up.   Laurence Aly, MD    Note: This note was generated in part or whole with voice recognition software. Voice recognition is usually quite accurate but there are transcription errors that can and very often do occur. I apologize for any typographical errors that were not detected and corrected.     Earleen Newport, MD 05/28/19 [phone]

## 2019-05-28 NOTE — ED Notes (Signed)
Pt is from [place] [place] [address] phone [phone]. Per group home cab pt back as they have an acct with them.

## 2019-05-28 NOTE — ED Notes (Signed)
Pt was attempting to get out of bed when I arrived to insert an iv. Pt states "I wanted to stand to see if I fall". Got pt back in bed and a sitter arrived.

## 2019-05-28 NOTE — ED Notes (Signed)
Patient cleaned with incontinent wipes and wash cloths. Brief placed on patient and underpad. Two warm blankets given

## 2019-05-28 NOTE — ED Notes (Signed)
Patient belongings: Russell Buchanan, brown belt and chapstick placed in belongings bag at bedside. Patients soiled underwear and socks soiled with stool thrown away per patient agreement. Patients blue jeans placed in separate patient belongings bag at bedside

## 2019-05-28 NOTE — ED Triage Notes (Signed)
Patient arrived by EMS from [facility] on [address] [facility] in [place] for weakness that started last night. EMS reports patient has had falls due to weakness. Normally ambulatory with no assistance. Patient was in floor when EMS arrived and able to stand with assistance and get on stretcher. Patient able to speak in complete sentences and answer questions upon arrival. Speech clear. No facial droop. Grip equal with moderate grip

## 2019-05-28 NOTE — ED Notes (Signed)
IV has been removed from right arm.

## 2019-06-02 ENCOUNTER — Inpatient Hospital Stay
Admission: AD | Admit: 2019-06-02 | Payer: Medicaid Other | Source: Other Acute Inpatient Hospital | Admitting: Internal Medicine

## 2019-06-03 DIAGNOSIS — R569 Unspecified convulsions: Secondary | ICD-10-CM

## 2019-06-03 DIAGNOSIS — J449 Chronic obstructive pulmonary disease, unspecified: Secondary | ICD-10-CM

## 2019-06-04 DIAGNOSIS — J449 Chronic obstructive pulmonary disease, unspecified: Secondary | ICD-10-CM | POA: Diagnosis not present

## 2019-06-04 DIAGNOSIS — R569 Unspecified convulsions: Secondary | ICD-10-CM | POA: Diagnosis not present

## 2019-06-05 DIAGNOSIS — R569 Unspecified convulsions: Secondary | ICD-10-CM | POA: Diagnosis not present

## 2019-06-05 DIAGNOSIS — J449 Chronic obstructive pulmonary disease, unspecified: Secondary | ICD-10-CM | POA: Diagnosis not present

## 2019-06-25 ENCOUNTER — Inpatient Hospital Stay
Admission: EM | Admit: 2019-06-25 | Discharge: 2019-06-27 | DRG: 101 | Disposition: A | Discharge status: Home / Self Care | Payer: Medicaid Other | Attending: Hospitalist | Admitting: Hospitalist

## 2019-06-25 ENCOUNTER — Encounter: Payer: Self-pay | Admitting: *Deleted

## 2019-06-25 ENCOUNTER — Emergency Department: Payer: Medicaid Other

## 2019-06-25 ENCOUNTER — Other Ambulatory Visit: Payer: Self-pay

## 2019-06-25 DIAGNOSIS — F319 Bipolar disorder, unspecified: Secondary | ICD-10-CM | POA: Diagnosis present

## 2019-06-25 DIAGNOSIS — Z915 Personal history of self-harm: Secondary | ICD-10-CM | POA: Diagnosis not present

## 2019-06-25 DIAGNOSIS — F17210 Nicotine dependence, cigarettes, uncomplicated: Secondary | ICD-10-CM | POA: Diagnosis present

## 2019-06-25 DIAGNOSIS — K219 Gastro-esophageal reflux disease without esophagitis: Secondary | ICD-10-CM | POA: Diagnosis present

## 2019-06-25 DIAGNOSIS — K449 Diaphragmatic hernia without obstruction or gangrene: Secondary | ICD-10-CM | POA: Diagnosis present

## 2019-06-25 DIAGNOSIS — R569 Unspecified convulsions: Principal | ICD-10-CM

## 2019-06-25 DIAGNOSIS — F209 Schizophrenia, unspecified: Secondary | ICD-10-CM | POA: Diagnosis present

## 2019-06-25 DIAGNOSIS — R251 Tremor, unspecified: Secondary | ICD-10-CM | POA: Diagnosis present

## 2019-06-25 DIAGNOSIS — K5909 Other constipation: Secondary | ICD-10-CM | POA: Diagnosis present

## 2019-06-25 DIAGNOSIS — Z91030 Bee allergy status: Secondary | ICD-10-CM | POA: Diagnosis not present

## 2019-06-25 DIAGNOSIS — F259 Schizoaffective disorder, unspecified: Secondary | ICD-10-CM | POA: Diagnosis present

## 2019-06-25 DIAGNOSIS — Z20822 Contact with and (suspected) exposure to covid-19: Secondary | ICD-10-CM | POA: Diagnosis present

## 2019-06-25 DIAGNOSIS — E785 Hyperlipidemia, unspecified: Secondary | ICD-10-CM | POA: Diagnosis present

## 2019-06-25 DIAGNOSIS — Z791 Long term (current) use of non-steroidal anti-inflammatories (NSAID): Secondary | ICD-10-CM | POA: Diagnosis not present

## 2019-06-25 DIAGNOSIS — F602 Antisocial personality disorder: Secondary | ICD-10-CM | POA: Diagnosis present

## 2019-06-25 DIAGNOSIS — R400 Somnolence: Secondary | ICD-10-CM | POA: Diagnosis not present

## 2019-06-25 DIAGNOSIS — R4182 Altered mental status, unspecified: Secondary | ICD-10-CM | POA: Diagnosis present

## 2019-06-25 LAB — COMPREHENSIVE METABOLIC PANEL
ALT: 10 U/L (ref 0–44)
AST: 19 U/L (ref 15–41)
Albumin: 3.8 g/dL (ref 3.5–5.0)
Alkaline Phosphatase: 98 U/L (ref 38–126)
Anion gap: 10 (ref 5–15)
BUN: 8 mg/dL (ref 6–20)
CO2: 27 mmol/L (ref 22–32)
Calcium: 8.8 mg/dL — ABNORMAL LOW (ref 8.9–10.3)
Chloride: 99 mmol/L (ref 98–111)
Creatinine, Ser: 0.97 mg/dL (ref 0.61–1.24)
GFR calc Af Amer: >60 mL/min (ref >60)
GFR calc non Af Amer: >60 mL/min (ref >60)
Glucose, Bld: 120 mg/dL — ABNORMAL HIGH (ref 70–99)
Potassium: 3.8 mmol/L (ref 3.5–5.1)
Sodium: 136 mmol/L (ref 135–145)
Total Bilirubin: 0.9 mg/dL (ref 0.3–1.2)
Total Protein: 7 g/dL (ref 6.5–8.1)

## 2019-06-25 LAB — URINALYSIS, COMPLETE (UACMP) WITH MICROSCOPIC
Bacteria, UA: NONE SEEN
Bilirubin Urine: NEGATIVE
Glucose, UA: NEGATIVE mg/dL
Hgb urine dipstick: NEGATIVE
Ketones, ur: 5 mg/dL — AB
Nitrite: NEGATIVE
Protein, ur: 30 mg/dL — AB
Specific Gravity, Urine: 1.012 (ref 1.005–1.030)
Squamous Epithelial / HPF: NONE SEEN (ref 0–5)
WBC, UA: >50 WBC/hpf — ABNORMAL HIGH (ref 0–5)
pH: 6 (ref 5.0–8.0)

## 2019-06-25 LAB — CBC WITH DIFFERENTIAL/PLATELET
Abs Immature Granulocytes: 0.05 10*3/uL (ref 0.00–0.07)
Basophils Absolute: 0 10*3/uL (ref 0.0–0.1)
Basophils Relative: 0 %
Eosinophils Absolute: 0.1 10*3/uL (ref 0.0–0.5)
Eosinophils Relative: 1 %
HCT: 43.8 % (ref 39.0–52.0)
Hemoglobin: 15 g/dL (ref 13.0–17.0)
Immature Granulocytes: 1 %
Lymphocytes Relative: 40 %
Lymphs Abs: 2.1 10*3/uL (ref 0.7–4.0)
MCH: 29.1 pg (ref 26.0–34.0)
MCHC: 34.2 g/dL (ref 30.0–36.0)
MCV: 84.9 fL (ref 80.0–100.0)
Monocytes Absolute: 0.5 10*3/uL (ref 0.1–1.0)
Monocytes Relative: 9 %
Neutro Abs: 2.6 10*3/uL (ref 1.7–7.7)
Neutrophils Relative %: 49 %
Platelets: 180 10*3/uL (ref 150–400)
RBC: 5.16 MIL/uL (ref 4.22–5.81)
RDW: 13.2 % (ref 11.5–15.5)
WBC: 5.3 10*3/uL (ref 4.0–10.5)
nRBC: 0 % (ref 0.0–0.2)

## 2019-06-25 LAB — URINE DRUG SCREEN, QUALITATIVE (ARMC ONLY)
Amphetamines, Ur Screen: NOT DETECTED
Barbiturates, Ur Screen: NOT DETECTED
Benzodiazepine, Ur Scrn: NOT DETECTED
Cannabinoid 50 Ng, Ur ~~LOC~~: NOT DETECTED
Cocaine Metabolite,Ur ~~LOC~~: NOT DETECTED
MDMA (Ecstasy)Ur Screen: NOT DETECTED
Methadone Scn, Ur: NOT DETECTED
Opiate, Ur Screen: NOT DETECTED
Phencyclidine (PCP) Ur S: NOT DETECTED
Tricyclic, Ur Screen: NOT DETECTED

## 2019-06-25 LAB — RESPIRATORY PANEL BY RT PCR (FLU A&B, COVID)
Influenza A by PCR: NEGATIVE
Influenza B by PCR: NEGATIVE
SARS Coronavirus 2 by RT PCR: NEGATIVE

## 2019-06-25 LAB — VALPROIC ACID LEVEL: Valproic Acid Lvl: 84 ug/mL (ref 50.0–100.0)

## 2019-06-25 LAB — MRSA PCR SCREENING: MRSA by PCR: NEGATIVE

## 2019-06-25 MED ORDER — LEVETIRACETAM IN NACL 1500 MG/100ML IV SOLN
1500.0000 mg | Freq: Once | INTRAVENOUS | Status: AC
  Administered 2019-06-25: 17:00:00 1500 mg via INTRAVENOUS
  Filled 2019-06-25: qty 100

## 2019-06-25 MED ORDER — ONDANSETRON HCL 4 MG/2ML IJ SOLN: 4.0000 mg | Freq: Four times a day (QID) | INTRAMUSCULAR | Status: DC | PRN

## 2019-06-25 MED ORDER — ACETAMINOPHEN 500 MG PO TABS
1000.0000 mg | ORAL_TABLET | Freq: Three times a day (TID) | ORAL | Status: DC | PRN
  Administered 2019-06-26: 1000 mg via ORAL
  Filled 2019-06-25: qty 2

## 2019-06-25 MED ORDER — DOCUSATE SODIUM 100 MG PO CAPS: 100.0000 mg | ORAL_CAPSULE | Freq: Two times a day (BID) | ORAL | Status: DC | PRN

## 2019-06-25 MED ORDER — GUAIFENESIN-DM 100-10 MG/5ML PO SYRP
10.0000 mL | ORAL_SOLUTION | Freq: Four times a day (QID) | ORAL | Status: DC | PRN
  Filled 2019-06-25: qty 10

## 2019-06-25 MED ORDER — ALUM & MAG HYDROXIDE-SIMETH 200-200-20 MG/5ML PO SUSP: 15.0000 mL | Freq: Four times a day (QID) | ORAL | Status: DC | PRN

## 2019-06-25 MED ORDER — ENOXAPARIN SODIUM 40 MG/0.4ML ~~LOC~~ SOLN
40.0000 mg | SUBCUTANEOUS | Status: DC
  Administered 2019-06-25 – 2019-06-26 (×2): 40 mg via SUBCUTANEOUS
  Filled 2019-06-25 (×2): qty 0.4

## 2019-06-25 MED ORDER — ONDANSETRON 4 MG PO TBDP
4.0000 mg | ORAL_TABLET | Freq: Three times a day (TID) | ORAL | Status: DC | PRN
  Filled 2019-06-25: qty 1

## 2019-06-25 MED ORDER — CALCIUM CARBONATE ANTACID 500 MG PO CHEW: 1.0000 | CHEWABLE_TABLET | Freq: Three times a day (TID) | ORAL | Status: DC | PRN

## 2019-06-25 MED ORDER — POLYETHYLENE GLYCOL 3350 17 G PO PACK: 17.0000 g | PACK | Freq: Two times a day (BID) | ORAL | Status: DC | PRN

## 2019-06-25 NOTE — ED Notes (Signed)
External male urinary pouch applied.

## 2019-06-25 NOTE — ED Notes (Signed)
Patient moved to room 26 for privacy. Patient has been incontinent of urine. Patient had a few episodes of sitting forward in the stretcher, but was easily redirected. Patient is sleeping at this time.

## 2019-06-25 NOTE — ED Notes (Signed)
EEG tech at bedside. 

## 2019-06-25 NOTE — ED Notes (Signed)
Patient remains unresponsive to voice, breathing at 16RR, skin is warm and dry. Bed linens are dry. External male catheter in place.

## 2019-06-25 NOTE — H&P (Signed)
History and Physical    Russell Buchanan VVZ:[id number] DOB: Jan 20, 1976 DOA: 06/25/2019  PCP: Russell Gang, FNP  Patient coming from: Group home  I have personally briefly reviewed patient's old medical records in [facility]  Chief Complaint: AMS  HPI: Russell Buchanan is a 44 y.o. male with medical history significant of bipolar disorder, schizophrenia and headache who presented from [facility] with unresponsiveness.    Pt was not able to answer any questions or follow commands at time of my interview.  Per EMS report, "Patient is a resident at [facility] and was found with altered mental status today. Patient was unresponsive except to painful stimuli. Patient arrived incontinent of urine. Patient assisted with sitting up to remove his sweater, but has not open eyes and only murmurs when asked questions."  ED Course: initial vitals: afebrile, HR 68, BP 134/89, sating 94% on room air.  Labs unremarkable.  CT head no acute finding.  COVID-19 neg.  Pt remained somnolent in the ED, however, noted to have purposeful movements sometimes.  Complained about pain when I/O cath was performed and attempted to stop it.  IV Keppra 1.5 g given.  Neurology was consulted in the ED and didn't believe pt was in status, but ordered EEG.  Pt was admitted as inpatient due to need for close monitoring and likelihood of staying 2 nights.  Assessment/Plan Principal Problem:   AMS (altered mental status) Active Problems:   Schizophrenia (HCC)   Bipolar disorder (HCC)   Antisocial personality disorder (HCC)  # AMS --Presented unresponsive, not following commands, incontinent of urine and stools, however, demonstrating some purposeful movements.  Vitals stable, labs unremarkable, CT head no acute finding. --Per chart patient had seizures as a child.   --IV Keppra 1.5 g given in the ED. --Neuro consulted in the ED. PLAN: --EEG, per neuro --UDS --Would not start additional  anticonvulsant therapy at this time, per neuro --Psych consult if EEG neg for seizure  # Hx of bipolar disorder and schizophrenia  --Hold home psych meds, Cogentin, Wellbutrin, Depakote, Lexapro, Seroquel, Haldol, Klonopin, Ativan, for now since pt is somnolent and may not be safe to swallow.  Also on Invega IM injection. --Psych consult if EEG neg for seizure  # Hx of headache --Not exhibiting signs of pain in his head --Hold home Depakote and Imitrex for now due to somnolence.  # HLD # GERD with hiatal hernia # Chronic nausea --IV zofran # Chronic constipation   DVT prophylaxis: Lovenox SQ Code Status: Full code  Family Communication: not today  Disposition Plan: Back to group home  Consults called: Neurology, psych Admission status: Inpatient   Review of Systems: As per HPI otherwise 10 point review of systems negative.   Past Medical History:  Diagnosis Date  . Antisocial personality disorder (HCC)   . Bipolar disorder (HCC)   . Environmental and seasonal allergies   . GERD (gastroesophageal reflux disease)   . Headache   . Hyperlipidemia   . Infected elbow (HCC)   . Schizophrenia (HCC)   . Tremors of nervous system     Past Surgical History:  Procedure Laterality Date  . CLOSED REDUCTION WRIST FRACTURE Left   . HARDWARE REMOVAL Right 04/14/2018   Procedure: HARDWARE REMOVAL-RIGHT ELBOW;  Surgeon: Deeann Saint, MD;  Location: [facility] ORS;  Service: Orthopedics;  Laterality: Right;  . ORIF ELBOW FRACTURE Right   . SCALP LACERATION REPAIR    . WRIST RECONSTRUCTION Left  reports that he has been smoking cigarettes. He has a 3.50 pack-year smoking history. He has quit using smokeless tobacco.  His smokeless tobacco use included chew and snuff. He reports previous drug use. He reports that he does not drink alcohol.  Allergies  Allergen Reactions  . Bee Venom Hives    Family History  Family history unknown: Yes  Neg seizures in mother and  father.   Prior to Admission medications   Medication Sig Start Date End Date Taking? Authorizing Provider  benztropine (COGENTIN) 1 MG tablet Take 1 mg by mouth 2 (two) times daily as needed for tremors.    Yes [provider]  buPROPion (WELLBUTRIN XL) 150 MG 24 hr tablet Take 150 mg by mouth daily.   Yes [provider]  cetirizine (ZYRTEC) 10 MG tablet Take 10 mg by mouth daily.   Yes [provider]  divalproex (DEPAKOTE ER) 500 MG 24 hr tablet Take 500 mg by mouth 3 (three) times daily.    Yes [provider]  docusate sodium (COLACE) 100 MG capsule Take 100 mg by mouth daily.   Yes [provider]  escitalopram (LEXAPRO) 20 MG tablet Take 20 mg by mouth daily.   Yes [provider]  furosemide (LASIX) 40 MG tablet Take 1 tablet (40 mg total) by mouth daily for 30 days. 06/25/18 06/25/19 Yes Paduchowski, Caryn Bee, MD  gabapentin (NEURONTIN) 400 MG capsule Take 1 capsule (400 mg total) by mouth 3 (three) times daily. 04/14/18  Yes Deeann Saint, MD  gemfibrozil (LOPID) 600 MG tablet Take 600 mg by mouth 2 (two) times daily before a meal.   Yes [provider]  linaclotide (LINZESS) 72 MCG capsule Take 72 mcg by mouth daily.    Yes [provider]  lipase/protease/amylase (CREON) 36000 UNITS CPEP capsule Take 36,000 Units by mouth 3 (three) times daily with meals. 2 caps withmeals and 1 with snacks   Yes [provider]  meloxicam (MOBIC) 15 MG tablet Take 1 tablet (15 mg total) by mouth daily. 04/14/18  Yes Deeann Saint, MD  metoprolol tartrate (LOPRESSOR) 50 MG tablet Take 50 mg by mouth daily.   Yes [provider]  omeprazole (PRILOSEC) 40 MG capsule Take 40 mg by mouth 2 (two) times daily before a meal. 04/16/19  Yes [provider]  pravastatin (PRAVACHOL) 20 MG tablet Take 20 mg by mouth at bedtime.   Yes [provider]  propranolol (INNOPRAN XL) 80 MG 24 hr capsule Take 80 mg by  mouth daily.   Yes [provider]  simethicone (MYLICON) 80 MG chewable tablet Chew 80 mg by mouth 2 (two) times daily.   Yes [provider]  sucralfate (CARAFATE) 1 g tablet Take 1 tablet (1 g total) by mouth 4 (four) times daily -  with meals and at bedtime. 05/13/18  Yes Willy Eddy, MD  Tiotropium Bromide-Olodaterol (STIOLTO RESPIMAT IN) Inhale 2 puffs into the lungs daily.   Yes [provider]  albuterol (PROVENTIL HFA;VENTOLIN HFA) 108 (90 Base) MCG/ACT inhaler Inhale 2 puffs into the lungs every 6 (six) hours as needed for wheezing or shortness of breath. 01/30/17   Merrily Brittle, MD  Butalbital-APAP-Caffeine 50-325-40 MG per capsule Take 1 capsule by mouth 2 (two) times daily as needed for headache.     [provider]  cephALEXin (KEFLEX) 500 MG capsule Take 1 capsule (500 mg total) by mouth 3 (three) times daily. Patient not taking: Reported on 06/25/2019 04/14/18  Deeann Saint, MD  clonazePAM (KLONOPIN) 1 MG tablet Take 1 mg by mouth 2 (two) times daily as needed for anxiety.    [provider]  clotrimazole-betamethasone (LOTRISONE) cream Apply 1 application topically 3 (three) times daily.    [provider]  EPINEPHrine 0.3 mg/0.3 mL IJ SOAJ injection Inject 0.3 mg into the muscle as needed (allergic reactions).     [provider]  Fremanezumab-vfrm (AJOVY) 225 MG/1.5ML SOSY Inject into the skin every 28 (twenty-eight) days.    [provider]  haloperidol (HALDOL) 10 MG tablet Take 10 mg by mouth 2 (two) times daily as needed (agitation).     [provider]  HYDROcodone-acetaminophen (NORCO) 5-325 MG tablet Take 1-2 tablets by mouth every 6 (six) hours as needed. 04/14/18   Deeann Saint, MD  ketoconazole (NIZORAL) 2 % cream Apply 1 application topically 2 (two) times daily.    [provider]  lipase/protease/amylase (CREON) 36000 UNITS CPEP capsule Take 36,000 Units by mouth. With  snacks    [provider]  LORazepam (ATIVAN) 1 MG tablet Take 1 mg by mouth every 8 (eight) hours as needed for anxiety.    [provider]  magnesium gluconate (MAGONATE) 500 MG tablet Take 500 mg by mouth daily.    [provider]  nystatin cream (MYCOSTATIN) Apply 1 application topically 2 (two) times daily as needed for dry skin.     [provider]  ondansetron (ZOFRAN ODT) 4 MG disintegrating tablet Take 1 tablet (4 mg total) by mouth every 8 (eight) hours as needed for nausea or vomiting. 12/23/16   [name], MD  Paliperidone Palmitate ER (INVEGA TRINZA) 819 MG/2.625ML SUSY Inject into the muscle every 3 (three) months.    [provider]  pantoprazole (PROTONIX) 40 MG tablet Take 40 mg by mouth 2 (two) times daily.     [provider]  polyethylene glycol (MIRALAX / GLYCOLAX) packet Take 17 g by mouth daily.    [provider]  promethazine (PHENERGAN) 12.5 MG tablet Take 1 tablet (12.5 mg total) by mouth every 6 (six) hours as needed for nausea or vomiting. 05/13/18   Willy Eddy, MD  QUEtiapine (SEROQUEL XR) 400 MG 24 hr tablet Take 800 mg by mouth at bedtime.    [provider]  Sulfamethoxazole-Trimethoprim (BACTRIM DS PO) Take by mouth every 12 (twelve) hours.    [provider]  sulfamethoxazole-trimethoprim (BACTRIM DS,SEPTRA DS) 800-160 MG tablet Take 1 tablet by mouth 2 (two) times daily. 04/14/18   Deeann Saint, MD  SUMAtriptan (IMITREX) 100 MG tablet Take 100 mg by mouth every 2 (two) hours as needed for migraine. May repeat in 2 hours if headache persists or recurs.    [provider]  tiotropium (SPIRIVA) 18 MCG inhalation capsule Place 18 mcg into inhaler and inhale daily.    [provider]    Physical Exam: Vitals:   06/25/19 1500 06/25/19 1625 06/25/19 1630 06/25/19 1700  BP: (!) 150/86 (!) 136/99 133/84 118/75  Pulse: (!) 58 63 61 (!) 59  Resp: 14 16 13 15    Temp:      TempSrc:      SpO2: 97% 98% 98% 95%  Weight:      Height:        Constitutional: NAD, unresponsive, not following commands, not talking HEENT: conjunctivae and lids normal, pupils equal CV: RRR no M,R,G. Distal pulses +2.  No cyanosis.   RESP: CTA B/L, normal respiratory effort, on  RA GI: +BS, NTND Extremities: No effusions, edema, in BLE SKIN: warm, dry and intact   Labs on Admission: I have personally reviewed following labs and imaging studies  CBC: Recent Labs  Lab 06/25/19 1310  WBC 5.3  NEUTROABS 2.6  HGB 15.0  HCT 43.8  MCV 84.9  PLT 180   Basic Metabolic Panel: Recent Labs  Lab 06/25/19 1310  NA 136  K 3.8  CL 99  CO2 27  GLUCOSE 120*  BUN 8  CREATININE 0.97  CALCIUM 8.8*   GFR: Estimated Creatinine Clearance: 124.6 mL/min (by C-G formula based on SCr of 0.97 mg/dL). Liver Function Tests: Recent Labs  Lab 06/25/19 1310  AST 19  ALT 10  ALKPHOS 98  BILITOT 0.9  PROT 7.0  ALBUMIN 3.8   No results for input(s): LIPASE, AMYLASE in the last 168 hours. No results for input(s): AMMONIA in the last 168 hours. Coagulation Profile: No results for input(s): INR, PROTIME in the last 168 hours. Cardiac Enzymes: No results for input(s): CKTOTAL, CKMB, CKMBINDEX, TROPONINI in the last 168 hours. BNP (last 3 results) No results for input(s): PROBNP in the last 8760 hours. HbA1C: No results for input(s): HGBA1C in the last 72 hours. CBG: No results for input(s): GLUCAP in the last 168 hours. Lipid Profile: No results for input(s): CHOL, HDL, LDLCALC, TRIG, CHOLHDL, LDLDIRECT in the last 72 hours. Thyroid Function Tests: No results for input(s): TSH, T4TOTAL, FREET4, T3FREE, THYROIDAB in the last 72 hours. Anemia Panel: No results for input(s): VITAMINB12, FOLATE, FERRITIN, TIBC, IRON, RETICCTPCT in the last 72 hours. Urine analysis:    Component Value Date/Time   COLORURINE YELLOW (A) 06/25/2019 1532   APPEARANCEUR HAZY (A) 06/25/2019  1532   APPEARANCEUR Clear 02/27/2014 2152   LABSPEC 1.012 06/25/2019 1532   LABSPEC 1.004 02/27/2014 2152   PHURINE 6.0 06/25/2019 1532   GLUCOSEU NEGATIVE 06/25/2019 1532   GLUCOSEU Negative 02/27/2014 2152   HGBUR NEGATIVE 06/25/2019 1532   BILIRUBINUR NEGATIVE 06/25/2019 1532   BILIRUBINUR Negative 02/27/2014 2152   KETONESUR 5 (A) 06/25/2019 1532   PROTEINUR 30 (A) 06/25/2019 1532   NITRITE NEGATIVE 06/25/2019 1532   LEUKOCYTESUR MODERATE (A) 06/25/2019 1532   LEUKOCYTESUR Negative 02/27/2014 2152    Radiological Exams on Admission: EEG  Result Date: 06/25/2019 Thana Farr, MD     06/25/2019  6:06 PM ELECTROENCEPHALOGRAM REPORT Patient: Russell Buchanan       Room #: ED26A EEG No. ID: [id number] Age: 44 y.o.        Sex: male Referring Physician: Fran Lowes Report Date:  06/25/2019       Interpreting Physician: Thana Farr History: Emani Morad is an 44 y.o. male with altered mental status Medications: Depakote, Neurontin, Seroquel, Haldol, Klonopin, Cogentin, Wellbutrin Conditions of Recording:  This is a 21 channel routine scalp EEG performed with bipolar and monopolar montages arranged in accordance to the international 10/20 system of electrode placement. One channel was dedicated to EKG recording. The patient is in the altered state and uncooperative. Description:  Artifact is prominent during the recording often obscuring the background rhythm. When able to be visualized the background is slow and poorly organized.  Rarely a low voltage, polymorphic delta rhythm is seen that is diffusely distributed and continuous. No epileptiform activity is noted.  Hyperventilation was not performed. Intermittent photic stimulation was performed but failed to illicit any change in the tracing. IMPRESSION: This is a technically difficult record due to the predominance of muscle and movement artifact.  The  underlying background rhythm appears slow.  No epileptiform activity is noted.  Alexis Goodell, MD  Neurology [phone] 06/25/2019, 5:54 PM   CT Head Wo Contrast  Result Date: 06/25/2019 CLINICAL DATA:  Altered mental status EXAM: CT HEAD WITHOUT CONTRAST TECHNIQUE: Contiguous axial images were obtained from the base of the skull through the vertex without intravenous contrast. COMPARISON:  06/02/2019 FINDINGS: Brain: No evidence of acute infarction, hemorrhage, hydrocephalus, extra-axial collection or mass lesion/mass effect. Vascular: No hyperdense vessel or unexpected calcification. Skull: Normal. Negative for fracture or focal lesion. Sinuses/Orbits: Mucous retention cyst versus sinonasal polyp within the left maxillary sinus, unchanged. Orbital structures intact. Other: None. IMPRESSION: No acute intracranial findings. Electronically Signed   By: Davina Poke D.O.   On: 06/25/2019 13:07      Enzo Bi MD [facility]  If 7PM-7AM, please contact night-coverage 06/25/2019, 7:28 PM

## 2019-06-25 NOTE — ED Provider Notes (Signed)
-----------------------------------------   3:57 PM on 06/25/2019 -----------------------------------------  Patient has been seen by neurology, does not believe the patient is in status but low arrange for a stat EEG for the patient.  We will admit to the hospitalist service.  There states while attempting to do an in and out catheterization of the patient he yelled stop and attempted to grab the catheter.   Minna Antis, MD 06/25/19 1558

## 2019-06-25 NOTE — Progress Notes (Signed)
eeg completed ° °

## 2019-06-25 NOTE — Procedures (Signed)
ELECTROENCEPHALOGRAM REPORT   Patient: Russell Buchanan       Room #: EL07A EEG No. ID: [id number] Age: 44 y.o.        Sex: male Referring Physician: Fran Lowes Report Date:  06/25/2019        Interpreting Physician: Thana Farr  History: Azzam Mehra is an 44 y.o. male with altered mental status  Medications:  Depakote, Neurontin, Seroquel, Haldol, Klonopin, Cogentin, Wellbutrin  Conditions of Recording:  This is a 21 channel routine scalp EEG performed with bipolar and monopolar montages arranged in accordance to the international 10/20 system of electrode placement. One channel was dedicated to EKG recording.  The patient is in the altered state and uncooperative.  Description:  Artifact is prominent during the recording often obscuring the background rhythm. When able to be visualized the background is slow and poorly organized.  Rarely a low voltage, polymorphic delta rhythm is seen that is diffusely distributed and continuous. No epileptiform activity is noted.   Hyperventilation was not performed. Intermittent photic stimulation was performed but failed to illicit any change in the tracing.    IMPRESSION: This is a technically difficult record due to the predominance of muscle and movement artifact.  The underlying background rhythm appears slow.  No epileptiform activity is noted.     Thana Farr, MD Neurology [phone] 06/25/2019, 5:54 PM

## 2019-06-25 NOTE — ED Provider Notes (Signed)
[facility] Emergency Department Provider Note    First MD Initiated Contact with Patient 06/25/19 1241     (approximate)  I have reviewed the triage vital signs and the nursing notes.   HISTORY  Chief Complaint Seizures  Level V Caveat:  AMS   HPI Russell Buchanan is a 44 y.o. male with the below listed past medical history presents the ER for altered mental status and reported seizure like activity.    Patient not providing additional history.  Will open eyes and follows simple commands but not participatory with exam for the most part.  Will speak in one to two word phrases.  Denies any pain.  Does not have any documented history of seizure disorder that can see the medical records.   Unknown last seen normal/   Past Medical History:  Diagnosis Date  . Antisocial personality disorder ([place])   . Bipolar disorder ([place])   . Environmental and seasonal allergies   . GERD (gastroesophageal reflux disease)   . Headache   . Hyperlipidemia   . Infected elbow ([place])   . Schizophrenia ([place])   . Tremors of nervous system    Family History  Family history unknown: Yes   Past Surgical History:  Procedure Laterality Date  . CLOSED REDUCTION WRIST FRACTURE Left   . HARDWARE REMOVAL Right 04/14/2018   Procedure: HARDWARE REMOVAL-RIGHT ELBOW;  Surgeon: Earnestine Leys, MD;  Location: [facility] ORS;  Service: Orthopedics;  Laterality: Right;  . ORIF ELBOW FRACTURE Right   . SCALP LACERATION REPAIR    . WRIST RECONSTRUCTION Left    Patient Active Problem List   Diagnosis Date Noted  . Acute encephalopathy 12/04/2016  . GERD (gastroesophageal reflux disease) 12/04/2016  . HLD (hyperlipidemia) 12/04/2016  . Schizophrenia ([place]) 11/15/2016  . Medication side effects 11/15/2016      Prior to Admission medications   Medication Sig Start Date End Date Taking? Authorizing Provider  albuterol (PROVENTIL HFA;VENTOLIN HFA) 108 (90 Base) MCG/ACT inhaler Inhale 2 puffs  into the lungs every 6 (six) hours as needed for wheezing or shortness of breath. 01/30/17   Darel Hong, MD  benztropine (COGENTIN) 1 MG tablet Take 1 mg by mouth 2 (two) times daily as needed for tremors.     [provider]  buPROPion (WELLBUTRIN XL) 150 MG 24 hr tablet Take 150 mg by mouth daily.    [provider]  Butalbital-APAP-Caffeine 50-325-40 MG per capsule Take 1 capsule by mouth 2 (two) times daily as needed for headache.     [provider]  cephALEXin (KEFLEX) 500 MG capsule Take 1 capsule (500 mg total) by mouth 3 (three) times daily. 04/14/18   Earnestine Leys, MD  cetirizine (ZYRTEC) 10 MG tablet Take 10 mg by mouth daily.    [provider]  clonazePAM (KLONOPIN) 1 MG tablet Take 1 mg by mouth 2 (two) times daily as needed for anxiety.    [provider]  clotrimazole-betamethasone (LOTRISONE) cream Apply 1 application topically 3 (three) times daily.    [provider]  divalproex (DEPAKOTE ER) 500 MG 24 hr tablet Take 500-1,000 mg by mouth 2 (two) times daily. Take 500 mg oral by mouth in the morning and take 1000 mg by mouth at bedtime.    [provider]  docusate sodium (COLACE) 100 MG capsule Take 100 mg by mouth daily.    [provider]  EPINEPHrine 0.3 mg/0.3 mL IJ SOAJ injection Inject 0.3 mg into the muscle as  needed (allergic reactions).     [provider]  escitalopram (LEXAPRO) 20 MG tablet Take 20 mg by mouth daily.    [provider]  Fremanezumab-vfrm (AJOVY) 225 MG/1.5ML SOSY Inject into the skin every 28 (twenty-eight) days.    [provider]  furosemide (LASIX) 40 MG tablet Take 1 tablet (40 mg total) by mouth daily for 30 days. 06/25/18 07/25/18  Minna Antis, MD  gabapentin (NEURONTIN) 400 MG capsule Take 1 capsule (400 mg total) by mouth 3 (three) times daily. 04/14/18   Deeann Saint, MD  gemfibrozil (LOPID) 600 MG tablet Take 600 mg by mouth 2 (two)  times daily before a meal.    [provider]  haloperidol (HALDOL) 10 MG tablet Take 10 mg by mouth 2 (two) times daily as needed (agitation).     [provider]  HYDROcodone-acetaminophen (NORCO) 5-325 MG tablet Take 1-2 tablets by mouth every 6 (six) hours as needed. 04/14/18   Deeann Saint, MD  ketoconazole (NIZORAL) 2 % cream Apply 1 application topically 2 (two) times daily.    [provider]  linaclotide (LINZESS) 72 MCG capsule Take 72 mcg by mouth every other day.    [provider]  lipase/protease/amylase (CREON) 36000 UNITS CPEP capsule Take 36,000 Units by mouth 3 (three) times daily with meals. 2 caps withmeals and 1 with snacks    [provider]  lipase/protease/amylase (CREON) 36000 UNITS CPEP capsule Take 36,000 Units by mouth. With snacks    [provider]  LORazepam (ATIVAN) 1 MG tablet Take 1 mg by mouth every 8 (eight) hours as needed for anxiety.    [provider]  magnesium gluconate (MAGONATE) 500 MG tablet Take 500 mg by mouth daily.    [provider]  meloxicam (MOBIC) 15 MG tablet Take 1 tablet (15 mg total) by mouth daily. 04/14/18   Deeann Saint, MD  nystatin cream (MYCOSTATIN) Apply 1 application topically 2 (two) times daily as needed for dry skin.     [provider]  ondansetron (ZOFRAN ODT) 4 MG disintegrating tablet Take 1 tablet (4 mg total) by mouth every 8 (eight) hours as needed for nausea or vomiting. 12/23/16   [name], MD  Paliperidone Palmitate ER (INVEGA TRINZA) 819 MG/2.625ML SUSY Inject into the muscle every 3 (three) months.    [provider]  pantoprazole (PROTONIX) 40 MG tablet Take 40 mg by mouth 2 (two) times daily.     [provider]  polyethylene glycol (MIRALAX / GLYCOLAX) packet Take 17 g by mouth daily.    [provider]  pravastatin (PRAVACHOL) 20 MG tablet Take 20 mg by mouth at bedtime.    [provider]    promethazine (PHENERGAN) 12.5 MG tablet Take 1 tablet (12.5 mg total) by mouth every 6 (six) hours as needed for nausea or vomiting. 05/13/18   Willy Eddy, MD  propranolol (INNOPRAN XL) 80 MG 24 hr capsule Take 80 mg by mouth daily.    [provider]  QUEtiapine (SEROQUEL XR) 400 MG 24 hr tablet Take 800 mg by mouth at bedtime.    [provider]  sucralfate (CARAFATE) 1 g tablet Take 1 tablet (1 g total) by mouth 4 (four) times daily -  with meals and at bedtime. 05/13/18   Willy Eddy, MD  Sulfamethoxazole-Trimethoprim (BACTRIM DS PO) Take by mouth every 12 (twelve) hours.    [provider]  sulfamethoxazole-trimethoprim (BACTRIM DS,SEPTRA DS) 800-160 MG tablet Take 1 tablet  by mouth 2 (two) times daily. 04/14/18   Deeann Saint, MD  SUMAtriptan (IMITREX) 100 MG tablet Take 100 mg by mouth every 2 (two) hours as needed for migraine. May repeat in 2 hours if headache persists or recurs.    [provider]  tiotropium (SPIRIVA) 18 MCG inhalation capsule Place 18 mcg into inhaler and inhale daily.    [provider]    Allergies Bee venom    Social History Social History   Tobacco Use  . Smoking status: Current Every Day Smoker    Packs/day: 0.50    Years: 7.00    Pack years: 3.50    Types: Cigarettes  . Smokeless tobacco: Former Neurosurgeon    Types: Chew, Snuff  Substance Use Topics  . Alcohol use: No  . Drug use: Not Currently    Review of Systems Patient denies headaches, rhinorrhea, blurry vision, numbness, shortness of breath, chest pain, edema, cough, abdominal pain, nausea, vomiting, diarrhea, dysuria, fevers, rashes or hallucinations unless otherwise stated above in HPI. ____________________________________________   PHYSICAL EXAM:  VITAL SIGNS: Vitals:   06/25/19 1235 06/25/19 1338  BP: 129/87 122/87  Pulse: 60 60  Resp: 16 16  Temp: 98.7 F (37.1 C)   SpO2: 98% 96%    Constitutional: opens eyes to  voice,  Tracks,  Follows simple commands but won't provide any additional history Eyes: Conjunctivae are normal.  Head: Atraumatic. Nose: No congestion/rhinnorhea. Mouth/Throat: Mucous membranes are moist.   Neck: No stridor. Painless ROM.  Cardiovascular: Normal rate, regular rhythm. Grossly normal heart sounds.  Good peripheral circulation. Respiratory: Normal respiratory effort.  No retractions. Lungs CTAB. Gastrointestinal: Soft and nontender. No distention. No abdominal bruits. No CVA tenderness. Genitourinary:  Musculoskeletal: No lower extremity tenderness nor edema.  No joint effusions. Neurologic:  MAE spontaneously,  No clonus or posturing.  Responds to painful stimuli,  Gag reflex intact Skin:  Skin is warm, dry and intact. No rash noted. Psychiatric unable to assess ____________________________________________   LABS (all labs ordered are listed, but only abnormal results are displayed)  Results for orders placed or performed during the hospital encounter of 06/25/19 (from the past 24 hour(s))  CBC with Differential/Platelet     Status: None   Collection Time: 06/25/19  1:10 PM  Result Value Ref Range   WBC 5.3 4.0 - 10.5 K/uL   RBC 5.16 4.22 - 5.81 MIL/uL   Hemoglobin 15.0 13.0 - 17.0 g/dL   HCT 26.9 48.5 - 46.2 %   MCV 84.9 80.0 - 100.0 fL   MCH 29.1 26.0 - 34.0 pg   MCHC 34.2 30.0 - 36.0 g/dL   RDW 70.3 50.0 - 93.8 %   Platelets 180 150 - 400 K/uL   nRBC 0.0 0.0 - 0.2 %   Neutrophils Relative % 49 %   Neutro Abs 2.6 1.7 - 7.7 K/uL   Lymphocytes Relative 40 %   Lymphs Abs 2.1 0.7 - 4.0 K/uL   Monocytes Relative 9 %   Monocytes Absolute 0.5 0.1 - 1.0 K/uL   Eosinophils Relative 1 %   Eosinophils Absolute 0.1 0.0 - 0.5 K/uL   Basophils Relative 0 %   Basophils Absolute 0.0 0.0 - 0.1 K/uL   Immature Granulocytes 1 %   Abs Immature Granulocytes 0.05 0.00 - 0.07 K/uL  Comprehensive metabolic panel     Status: Abnormal   Collection Time: 06/25/19  1:10 PM   Result Value Ref Range   Sodium 136 135 - 145 mmol/L  Potassium 3.8 3.5 - 5.1 mmol/L   Chloride 99 98 - 111 mmol/L   CO2 27 22 - 32 mmol/L   Glucose, Bld 120 (H) 70 - 99 mg/dL   BUN 8 6 - 20 mg/dL   Creatinine, Ser 3.87 0.61 - 1.24 mg/dL   Calcium 8.8 (L) 8.9 - 10.3 mg/dL   Total Protein 7.0 6.5 - 8.1 g/dL   Albumin 3.8 3.5 - 5.0 g/dL   AST 19 15 - 41 U/L   ALT 10 0 - 44 U/L   Alkaline Phosphatase 98 38 - 126 U/L   Total Bilirubin 0.9 0.3 - 1.2 mg/dL   GFR calc non Af Amer >60 >60 mL/min   GFR calc Af Amer >60 >60 mL/min   Anion gap 10 5 - 15  Valproic acid level     Status: None   Collection Time: 06/25/19  1:10 PM  Result Value Ref Range   Valproic Acid Lvl 84 50.0 - 100.0 ug/mL   ____________________________________________  EKG My review and personal interpretation at Time: 14:47   Indication: ams  Rate: 60  Rhythm: sinus Axis: normal Other: normal intervals, no stemi ____________________________________________  RADIOLOGY  I personally reviewed all radiographic images ordered to evaluate for the above acute complaints and reviewed radiology reports and findings.  These findings were personally discussed with the patient.  Please see medical record for radiology report.  ____________________________________________   PROCEDURES  Procedure(s) performed:  Procedures    Critical Care performed: no ____________________________________________   INITIAL IMPRESSION / ASSESSMENT AND PLAN / ED COURSE  Pertinent labs & imaging results that were available during my care of the patient were reviewed by me and considered in my medical decision making (see chart for details).   DDX: Dehydration, sepsis, pna, uti, hypoglycemia, cva, drug effect, withdrawal, encephalitis   Russell Buchanan is a 44 y.o. who presents to the ED with symptoms as presented above.  Patient currently protecting his airway.  Report concerning for possible seizure now possibly postictal.  CT  imaging will be ordered.  Not very cooperative with exam.  Will be monitored.  No report of any trauma.  Unknown last seen normal.  Not a code stroke.  Also not with any lateralizing features at this time.  Clinical Course as of Jun 25 1535  Thu Jun 25, 2019  1416 Note found in care everywhere suggesting possible sz disorder. " 5. Suspected status epilepticus - treated at [facility] (records scanned) Previously ordered by Dr. Garald Braver at [facility] Depakote 500 mg three times a day  -Continue taking Depakote 500 mg three times a day  - We will order routine EEG   "    [PR]  1416 Patient now asking for help using restroom.    [PR]  1509 Patient still "post ictal" Very difficult to tease out his exam given waxing and waning presentation.  potentially seizure related versus underlying psychiatric illness related.  He will follow commands intermittently and speak.  He is not having any lateralizing weakness on my exam right now but does seem to have waxing and waning periods of alertness.  Will consult with neurology.   [PR]  1521 Gust case with Dr. Thad Ranger of neurology who kindly agrees to evaluate patient at bedside.  I will give loading dose of Keppra.  Does not seem to be having active seizure right now.  He is protecting his airway.  May require transfer to facility with continuous EEG capability.  Depakote level appears normal.   [  PR]    Clinical Course User Index [PR] Willy Eddy, MD    The patient was evaluated in Emergency Department today for the symptoms described in the history of present illness. He/she was evaluated in the context of the global COVID-19 pandemic, which necessitated consideration that the patient might be at risk for infection with the SARS-CoV-2 virus that causes COVID-19. Institutional protocols and algorithms that pertain to the evaluation of patients at risk for COVID-19 are in a state of rapid change based on information released by  regulatory bodies including the CDC and federal and state organizations. These policies and algorithms were followed during the patient's care in the ED.  As part of my medical decision making, I reviewed the following data within the electronic MEDICAL RECORD NUMBER Nursing notes reviewed and incorporated, Labs reviewed, notes from prior ED visits and [place] Controlled Substance Database   ____________________________________________   FINAL CLINICAL IMPRESSION(S) / ED DIAGNOSES  Final diagnoses:  Altered mental status, unspecified altered mental status type      NEW MEDICATIONS STARTED DURING THIS VISIT:  New Prescriptions   No medications on file     Note:  This document was prepared using Dragon voice recognition software and may include unintentional dictation errors.    Willy Eddy, MD 06/25/19 1537

## 2019-06-25 NOTE — ED Notes (Signed)
Patient appears more active, rubbing mask over nose after nasal swab, taking blanket off his legs. Patient's eyes remain closed, and patient does not answer questions.

## 2019-06-25 NOTE — ED Notes (Addendum)
Charted under wrong pt

## 2019-06-25 NOTE — Consult Note (Signed)
Reason for Consult:AMS Referring Physician: Roxan Hockey  CC: AMS  HPI: Russell Buchanan is an 44 y.o. male with a history of BPD and schizophrenia who is unable to provide any history today due to mental status therefore all history obtained from the chart.  At care home patient was found altered.  Was unresponsive except to painful stimuli.  EMS called and patient brought in for evaluation.  Was incontinent of urine and stool and remained altered.  Despite being altered was able to help take his clothes off.  Was also purposely resisting catheter placement and told nursing "Ouch that hurts".  Consult called for question of possible seizure activity.  Per chart patient had seizures as a child.    Past Medical History:  Diagnosis Date  . Antisocial personality disorder (HCC)   . Bipolar disorder (HCC)   . Environmental and seasonal allergies   . GERD (gastroesophageal reflux disease)   . Headache   . Hyperlipidemia   . Infected elbow (HCC)   . Schizophrenia (HCC)   . Tremors of nervous system     Past Surgical History:  Procedure Laterality Date  . CLOSED REDUCTION WRIST FRACTURE Left   . HARDWARE REMOVAL Right 04/14/2018   Procedure: HARDWARE REMOVAL-RIGHT ELBOW;  Surgeon: Deeann Saint, MD;  Location: [facility] ORS;  Service: Orthopedics;  Laterality: Right;  . ORIF ELBOW FRACTURE Right   . SCALP LACERATION REPAIR    . WRIST RECONSTRUCTION Left     Family History  Family history unknown: Yes    Social History:  reports that he has been smoking cigarettes. He has a 3.50 pack-year smoking history. He has quit using smokeless tobacco.  His smokeless tobacco use included chew and snuff. He reports previous drug use. He reports that he does not drink alcohol.  Allergies  Allergen Reactions  . Bee Venom Hives    Medications: I have reviewed the patient's current medications. Prior to Admission:  Prior to Admission medications   Medication Sig Start Date End Date Taking? Authorizing  Provider  albuterol (PROVENTIL HFA;VENTOLIN HFA) 108 (90 Base) MCG/ACT inhaler Inhale 2 puffs into the lungs every 6 (six) hours as needed for wheezing or shortness of breath. 01/30/17   Merrily Brittle, MD  benztropine (COGENTIN) 1 MG tablet Take 1 mg by mouth 2 (two) times daily as needed for tremors.     [provider]  buPROPion (WELLBUTRIN XL) 150 MG 24 hr tablet Take 150 mg by mouth daily.    [provider]  Butalbital-APAP-Caffeine 50-325-40 MG per capsule Take 1 capsule by mouth 2 (two) times daily as needed for headache.     [provider]  cephALEXin (KEFLEX) 500 MG capsule Take 1 capsule (500 mg total) by mouth 3 (three) times daily. 04/14/18   Deeann Saint, MD  cetirizine (ZYRTEC) 10 MG tablet Take 10 mg by mouth daily.    [provider]  clonazePAM (KLONOPIN) 1 MG tablet Take 1 mg by mouth 2 (two) times daily as needed for anxiety.    [provider]  clotrimazole-betamethasone (LOTRISONE) cream Apply 1 application topically 3 (three) times daily.    [provider]  divalproex (DEPAKOTE ER) 500 MG 24 hr tablet Take 500-1,000 mg by mouth 2 (two) times daily. Take 500 mg oral by mouth in the morning and take 1000 mg by mouth at bedtime.    [provider]  docusate sodium (COLACE) 100 MG capsule Take 100 mg by mouth daily.    [provider]  EPINEPHrine 0.3 mg/0.3 mL IJ SOAJ injection Inject 0.3 mg into the muscle as needed (allergic reactions).     [provider]  escitalopram (LEXAPRO) 20 MG tablet Take 20 mg by mouth daily.    [provider]  Fremanezumab-vfrm (AJOVY) 225 MG/1.5ML SOSY Inject into the skin every 28 (twenty-eight) days.    [provider]  furosemide (LASIX) 40 MG tablet Take 1 tablet (40 mg total) by mouth daily for 30 days. 06/25/18 07/25/18  Minna Antis, MD  gabapentin (NEURONTIN) 400 MG capsule Take 1 capsule (400 mg total) by mouth 3 (three) times daily.  04/14/18   Deeann Saint, MD  gemfibrozil (LOPID) 600 MG tablet Take 600 mg by mouth 2 (two) times daily before a meal.    [provider]  haloperidol (HALDOL) 10 MG tablet Take 10 mg by mouth 2 (two) times daily as needed (agitation).     [provider]  HYDROcodone-acetaminophen (NORCO) 5-325 MG tablet Take 1-2 tablets by mouth every 6 (six) hours as needed. 04/14/18   Deeann Saint, MD  ketoconazole (NIZORAL) 2 % cream Apply 1 application topically 2 (two) times daily.    [provider]  linaclotide (LINZESS) 72 MCG capsule Take 72 mcg by mouth every other day.    [provider]  lipase/protease/amylase (CREON) 36000 UNITS CPEP capsule Take 36,000 Units by mouth 3 (three) times daily with meals. 2 caps withmeals and 1 with snacks    [provider]  lipase/protease/amylase (CREON) 36000 UNITS CPEP capsule Take 36,000 Units by mouth. With snacks    [provider]  LORazepam (ATIVAN) 1 MG tablet Take 1 mg by mouth every 8 (eight) hours as needed for anxiety.    [provider]  magnesium gluconate (MAGONATE) 500 MG tablet Take 500 mg by mouth daily.    [provider]  meloxicam (MOBIC) 15 MG tablet Take 1 tablet (15 mg total) by mouth daily. 04/14/18   Deeann Saint, MD  nystatin cream (MYCOSTATIN) Apply 1 application topically 2 (two) times daily as needed for dry skin.     [provider]  ondansetron (ZOFRAN ODT) 4 MG disintegrating tablet Take 1 tablet (4 mg total) by mouth every 8 (eight) hours as needed for nausea or vomiting. 12/23/16   [name], MD  Paliperidone Palmitate ER (INVEGA TRINZA) 819 MG/2.625ML SUSY Inject into the muscle every 3 (three) months.    [provider]  pantoprazole (PROTONIX) 40 MG tablet Take 40 mg by mouth 2 (two) times daily.     [provider]  polyethylene glycol (MIRALAX / GLYCOLAX) packet Take 17 g by mouth daily.    [provider]   pravastatin (PRAVACHOL) 20 MG tablet Take 20 mg by mouth at bedtime.    [provider]  promethazine (PHENERGAN) 12.5 MG tablet Take 1 tablet (12.5 mg total) by mouth every 6 (six) hours as needed for nausea or vomiting. 05/13/18   Willy Eddy, MD  propranolol (INNOPRAN XL) 80 MG 24 hr capsule Take 80 mg by mouth daily.    [provider]  QUEtiapine (SEROQUEL XR) 400 MG 24 hr tablet Take 800 mg by mouth at bedtime.    [provider]  sucralfate (CARAFATE) 1 g tablet Take 1 tablet (1 g total) by mouth 4 (four) times daily -  with meals and at bedtime. 05/13/18   Willy Eddy, MD  Sulfamethoxazole-Trimethoprim (BACTRIM DS PO) Take by mouth every 12 (twelve) hours.    [provider]  sulfamethoxazole-trimethoprim (BACTRIM DS,SEPTRA DS) 800-160 MG tablet Take 1 tablet by mouth 2 (two) times daily. 04/14/18   Earnestine Leys, MD  SUMAtriptan (IMITREX) 100 MG tablet Take 100 mg by mouth every 2 (two) hours as needed for migraine. May repeat in 2 hours if headache persists or recurs.    [provider]  tiotropium (SPIRIVA) 18 MCG inhalation capsule Place 18 mcg into inhaler and inhale daily.    [provider]    ROS: Unable to obtain due to mental status  Physical Examination: Blood pressure 122/87, pulse 60, temperature 98.7 F (37.1 C), temperature source Axillary, resp. rate 16, height 6\' 1"  (1.854 m), weight 104.3 kg, SpO2 96 %.  HEENT-  Normocephalic, no lesions, without obvious abnormality.  Normal external eye and conjunctiva.  Normal TM's bilaterally.  Normal auditory canals and external ears. Normal external nose, mucus membranes and septum.  Normal pharynx. Cardiovascular- S1, S2 normal, pulses palpable throughout   Lungs- chest clear, no wheezing, rales, normal symmetric air entry Abdomen- soft, non-tender; bowel sounds normal; no masses,  no organomegaly Extremities- no edema Lymph-no adenopathy  palpable Musculoskeletal-no joint tenderness, deformity or swelling Skin-warm and dry, no hyperpigmentation, vitiligo, or suspicious lesions  Neurological Examination   Mental Status: Patient does not respond to verbal stimuli.  Grimaces to deep sternal rub.  Does not follow commands.  Responds "no" to questioning at times.  Making snoring noises. Cranial Nerves: II: patient does not respond confrontation bilaterally, pupils right 3 mm, left 3 mm,and reactive bilaterally III,IV,VI: Oculocephalic response absent bilaterally.  V,VII: corneal reflex present bilaterally  VIII: patient does not respond to verbal stimuli IX,X: gag reflex unable to be tested, XI: trapezius strength unable to test bilaterally XII: tongue strength unable to test Motor: Does not move extremities spontaneously but when arms lifted does not allow them to hit his face.  When legs lifted maintains before dropping to the bed Sensory: Grimaces to noxious stimuli in all extremities Deep Tendon Reflexes:  Symmetric throughout. Plantars: Mute bilaterally Cerebellar: Unable to perform   Laboratory Studies:   Basic Metabolic Panel: Recent Labs  Lab 06/25/19 1310  NA 136  K 3.8  CL 99  CO2 27  GLUCOSE 120*  BUN 8  CREATININE 0.97  CALCIUM 8.8*    Liver Function Tests: Recent Labs  Lab 06/25/19 1310  AST 19  ALT 10  ALKPHOS 98  BILITOT 0.9  PROT 7.0  ALBUMIN 3.8   No results for input(s): LIPASE, AMYLASE in the last 168 hours. No results for input(s): AMMONIA in the last 168 hours.  CBC: Recent Labs  Lab 06/25/19 1310  WBC 5.3  NEUTROABS 2.6  HGB 15.0  HCT 43.8  MCV 84.9  PLT 180    Cardiac Enzymes: No results for input(s): CKTOTAL, CKMB, CKMBINDEX, TROPONINI in the last 168 hours.  BNP: Invalid input(s): POCBNP  CBG: No results for input(s): GLUCAP in the last 168 hours.  Microbiology: Results for orders placed or performed during the hospital encounter of 04/14/18   Aerobic/Anaerobic Culture (surgical/deep wound)     Status: None   Collection Time: 04/14/18 11:57 AM   Specimen: Wound; Body Fluid  Result Value Ref Range Status   Specimen Description   Final    WOUND Performed at [facility], [address] [place], [place] [address]    Special Requests   Final    NONE Performed at [facility], [address] [place], [place] [address]  Gram Stain NO WBC SEEN NO ORGANISMS SEEN   Final   Culture   Final    No growth aerobically or anaerobically. Performed at [facility], [address], [place], [place] [address]    Report Status 04/20/2018 FINAL  Final  Aerobic/Anaerobic Culture (surgical/deep wound)     Status: None   Collection Time: 04/14/18 11:57 AM   Specimen: Wound; Tissue  Result Value Ref Range Status   Specimen Description   Final    WOUND Performed at [facility], [address] [place], [place] [address]    Special Requests   Final    NONE Performed at [facility], [address] [place], [place] [address]    Gram Stain NO WBC SEEN NO ORGANISMS SEEN   Final   Culture   Final    No growth aerobically or anaerobically. Performed at [facility], [address], [place], [place] [address]    Report Status 04/20/2018 FINAL  Final    Coagulation Studies: No results for input(s): LABPROT, INR in the last 72 hours.  Urinalysis:  Recent Labs  Lab 06/25/19 1532  COLORURINE YELLOW*  LABSPEC 1.012  PHURINE 6.0  GLUCOSEU NEGATIVE  HGBUR NEGATIVE  BILIRUBINUR NEGATIVE  KETONESUR 5*  PROTEINUR 30*  NITRITE NEGATIVE  LEUKOCYTESUR MODERATE*    Lipid Panel:     Component Value Date/Time   CHOL 192 03/27/2012 0613   TRIG 139 03/27/2012 0613   HDL 38 (L) 03/27/2012 0613   VLDL 28 03/27/2012 0613   LDLCALC 126 (H) 03/27/2012 0613    HgbA1C: No results found for: HGBA1C  Urine Drug Screen:      Component Value Date/Time   LABOPIA NONE DETECTED  05/28/2019 0956   COCAINSCRNUR NONE DETECTED 05/28/2019 0956   LABBENZ NONE DETECTED 05/28/2019 0956   AMPHETMU NONE DETECTED 05/28/2019 0956   THCU NONE DETECTED 05/28/2019 0956   LABBARB NONE DETECTED 05/28/2019 0956    Alcohol Level: No results for input(s): ETH in the last 168 hours.  Other results: EKG: sinus rhythm at 59 bpm.  Imaging: CT Head Wo Contrast  Result Date: 06/25/2019 CLINICAL DATA:  Altered mental status EXAM: CT HEAD WITHOUT CONTRAST TECHNIQUE: Contiguous axial images were obtained from the base of the skull through the vertex without intravenous contrast. COMPARISON:  06/02/2019 FINDINGS: Brain: No evidence of acute infarction, hemorrhage, hydrocephalus, extra-axial collection or mass lesion/mass effect. Vascular: No hyperdense vessel or unexpected calcification. Skull: Normal. Negative for fracture or focal lesion. Sinuses/Orbits: Mucous retention cyst versus sinonasal polyp within the left maxillary sinus, unchanged. Orbital structures intact. Other: None. IMPRESSION: No acute intracranial findings. Electronically Signed   By: Duanne Guess D.O.   On: 06/25/2019 13:07     Assessment/Plan: 44 y.o. male with a history of BPD and schizophrenia presenting with altered mental status with question of possible seizure activity.  Patient has multiple features on examination and since presentation that suggests that this is a functional presentation.  Patient on Depakote and Neurontin at baseline likely for psychiatric disease.  Depakote level of 84.  Had seizures as a child.   Head CT reviewed and shows no acute changes.    Recommendations: 1. Seizure precautions 2. EEG stat.  Discussed with technician. 3. Would not start additional anticonvulsant therapy at this time.  4. UDS  5. If EEG shows no evidence of seizure activity would have psychiatry evaluate the patient.    Thana Farr, MD Neurology [phone] 06/25/2019, 4:19 PM

## 2019-06-25 NOTE — ED Triage Notes (Signed)
Per EMS report, Patient is a resident at [facility] and was found with altered mental status today. Patient was unresponsive except to painful stimuli. Patient arrived incontinent of urine. Patient assisted with sitting up to remove his sweater, but has not open eyes and only murmurs when asked questions. Bedrails padded with blankets for safety.

## 2019-06-25 NOTE — ED Notes (Signed)
Patient has been moved to the hallway. Patient is sleeping at this time. NAD. Waiting for the floor bed to be ready.

## 2019-06-25 NOTE — ED Notes (Signed)
Patient was cleaned of urine and stool. Patient tolerated it well. Linens were changed. Patient had no reaction to turning or repositioning. Patient continued snoring. Patient did say "Ouch, that hurts." when being In and Out cath. Dr. Roxan Hockey aware.

## 2019-06-25 NOTE — ED Notes (Signed)
Patient taken to CT scan. Patient is moving legs and arms independently. NAD.

## 2019-06-25 NOTE — ED Notes (Signed)
Two calls made to floor and floor refused to accept patient at this time in order to move him to another room for visibility.

## 2019-06-25 NOTE — ED Notes (Signed)
This Clinical research associate was called to the room. Patient became restless, sitting up during EEG. Patient was instructed to lie back and patient states he didn't want to lie back. Patient did cooperate with removal of EEG leads. Patient took off B/P cuff and tried to take off his blanket, but now is leaving everything alone.

## 2019-06-26 ENCOUNTER — Inpatient Hospital Stay: Payer: Medicaid Other

## 2019-06-26 ENCOUNTER — Other Ambulatory Visit: Payer: Self-pay

## 2019-06-26 LAB — BASIC METABOLIC PANEL
Anion gap: 11 (ref 5–15)
BUN: 11 mg/dL (ref 6–20)
CO2: 27 mmol/L (ref 22–32)
Calcium: 9 mg/dL (ref 8.9–10.3)
Chloride: 102 mmol/L (ref 98–111)
Creatinine, Ser: 0.7 mg/dL (ref 0.61–1.24)
GFR calc Af Amer: >60 mL/min (ref >60)
GFR calc non Af Amer: >60 mL/min (ref >60)
Glucose, Bld: 92 mg/dL (ref 70–99)
Potassium: 3.4 mmol/L — ABNORMAL LOW (ref 3.5–5.1)
Sodium: 140 mmol/L (ref 135–145)

## 2019-06-26 LAB — CBC
HCT: 41.4 % (ref 39.0–52.0)
Hemoglobin: 14.7 g/dL (ref 13.0–17.0)
MCH: 30.1 pg (ref 26.0–34.0)
MCHC: 35.5 g/dL (ref 30.0–36.0)
MCV: 84.7 fL (ref 80.0–100.0)
Platelets: 174 10*3/uL (ref 150–400)
RBC: 4.89 MIL/uL (ref 4.22–5.81)
RDW: 13.2 % (ref 11.5–15.5)
WBC: 5 10*3/uL (ref 4.0–10.5)
nRBC: 0 % (ref 0.0–0.2)

## 2019-06-26 LAB — MAGNESIUM: Magnesium: 2 mg/dL (ref 1.7–2.4)

## 2019-06-26 LAB — HIV ANTIBODY (ROUTINE TESTING W REFLEX): HIV Screen 4th Generation wRfx: NONREACTIVE

## 2019-06-26 MED ORDER — METOPROLOL TARTRATE 50 MG PO TABS
50.0000 mg | ORAL_TABLET | Freq: Every day | ORAL | Status: DC
  Administered 2019-06-27: 50 mg via ORAL
  Filled 2019-06-26: qty 1

## 2019-06-26 MED ORDER — CLONAZEPAM 1 MG PO TABS: 1.0000 mg | ORAL_TABLET | Freq: Two times a day (BID) | ORAL | Status: DC | PRN

## 2019-06-26 MED ORDER — PANCRELIPASE (LIP-PROT-AMYL) 12000-38000 UNITS PO CPEP
12000.0000 [IU] | ORAL_CAPSULE | Freq: Two times a day (BID) | ORAL | Status: DC | PRN
  Filled 2019-06-26: qty 1

## 2019-06-26 MED ORDER — PANCRELIPASE (LIP-PROT-AMYL) 12000-38000 UNITS PO CPEP
36000.0000 [IU] | ORAL_CAPSULE | Freq: Three times a day (TID) | ORAL | Status: DC
  Administered 2019-06-27: 36000 [IU] via ORAL
  Filled 2019-06-26 (×3): qty 3

## 2019-06-26 MED ORDER — DIVALPROEX SODIUM 500 MG PO DR TAB
500.0000 mg | DELAYED_RELEASE_TABLET | Freq: Every day | ORAL | Status: DC
  Administered 2019-06-27: 500 mg via ORAL
  Filled 2019-06-26: qty 1

## 2019-06-26 MED ORDER — BUTALBITAL-APAP-CAFFEINE 50-325-40 MG PO TABS
1.0000 | ORAL_TABLET | Freq: Two times a day (BID) | ORAL | Status: DC | PRN
  Filled 2019-06-26: qty 1

## 2019-06-26 MED ORDER — SUMATRIPTAN SUCCINATE 50 MG PO TABS
100.0000 mg | ORAL_TABLET | ORAL | Status: DC | PRN
  Filled 2019-06-26: qty 2

## 2019-06-26 MED ORDER — GABAPENTIN 400 MG PO CAPS
400.0000 mg | ORAL_CAPSULE | Freq: Three times a day (TID) | ORAL | Status: DC
  Administered 2019-06-26 – 2019-06-27 (×3): 400 mg via ORAL
  Filled 2019-06-26 (×3): qty 1

## 2019-06-26 MED ORDER — DIVALPROEX SODIUM 500 MG PO DR TAB
1000.0000 mg | DELAYED_RELEASE_TABLET | Freq: Every day | ORAL | Status: DC
  Filled 2019-06-26: qty 2

## 2019-06-26 MED ORDER — QUETIAPINE FUMARATE 25 MG PO TABS
75.0000 mg | ORAL_TABLET | Freq: Every day | ORAL | Status: DC
  Administered 2019-06-26: 75 mg via ORAL
  Filled 2019-06-26: qty 3

## 2019-06-26 MED ORDER — PRAVASTATIN SODIUM 20 MG PO TABS
40.0000 mg | ORAL_TABLET | Freq: Every evening | ORAL | Status: DC
  Administered 2019-06-26: 40 mg via ORAL
  Filled 2019-06-26: qty 2

## 2019-06-26 MED ORDER — ALBUTEROL SULFATE (2.5 MG/3ML) 0.083% IN NEBU: 2.5000 mg | INHALATION_SOLUTION | Freq: Four times a day (QID) | RESPIRATORY_TRACT | Status: DC | PRN

## 2019-06-26 MED ORDER — DIVALPROEX SODIUM 500 MG PO DR TAB
1000.0000 mg | DELAYED_RELEASE_TABLET | Freq: Two times a day (BID) | ORAL | Status: AC
  Administered 2019-06-26 (×2): 1000 mg via ORAL
  Filled 2019-06-26 (×2): qty 2

## 2019-06-26 MED ORDER — TIOTROPIUM BROMIDE MONOHYDRATE 18 MCG IN CAPS
18.0000 ug | ORAL_CAPSULE | Freq: Every day | RESPIRATORY_TRACT | Status: DC
  Administered 2019-06-27: 18 ug via RESPIRATORY_TRACT
  Filled 2019-06-26: qty 5

## 2019-06-26 MED ORDER — PANTOPRAZOLE SODIUM 40 MG PO TBEC
40.0000 mg | DELAYED_RELEASE_TABLET | Freq: Every day | ORAL | Status: DC
  Administered 2019-06-27: 40 mg via ORAL
  Filled 2019-06-26: qty 1

## 2019-06-26 MED ORDER — GADOBUTROL 1 MMOL/ML IV SOLN
10.0000 mL | Freq: Once | INTRAVENOUS | Status: AC | PRN
  Administered 2019-06-26: 10 mL via INTRAVENOUS

## 2019-06-26 MED ORDER — ESCITALOPRAM OXALATE 10 MG PO TABS
20.0000 mg | ORAL_TABLET | Freq: Every day | ORAL | Status: DC
  Administered 2019-06-26 – 2019-06-27 (×2): 20 mg via ORAL
  Filled 2019-06-26 (×2): qty 2

## 2019-06-26 MED ORDER — LINACLOTIDE 72 MCG PO CAPS
72.0000 ug | ORAL_CAPSULE | Freq: Every day | ORAL | Status: DC
  Administered 2019-06-27: 72 ug via ORAL
  Filled 2019-06-26 (×2): qty 1

## 2019-06-26 NOTE — Progress Notes (Signed)
PROGRESS NOTE    Russell Buchanan  NFA:[id number] DOB: 01-13-1976 DOA: 06/25/2019 PCP: Armando Gang, FNP    Assessment & Plan:   Principal Problem:   AMS (altered mental status) Active Problems:   Schizophrenia (HCC)   Bipolar disorder (HCC)   Antisocial personality disorder (HCC)    Russell Buchanan is a 44 y.o. male with medical history significant of bipolar disorder, schizophrenia and headache who presented from [facility] with unresponsiveness.     # AMS, resolved # Possible seizures --Presented unresponsive, not following commands, incontinent of urine and stools, however, demonstrating some purposeful movements.  Vitals stable, labs unremarkable, CT head no acute finding. --Per chart patient had seizures as a child.  --IV Keppra 1.5 g given in the ED. --Neuro consulted in the ED. --EEG No epileptiform activity is noted, per neuro PLAN per Neuro: 1. restart Depakote with 1000mg  BID to be administered today and returning back to 500mg  in the morning and 1000mg  at night starting tomorrow. 2. Repeat EEG today 3. Restart Neurontin at 400 mg TID 4. Will not continue Keppra 5. Agree with discontinuation of Wellbutrin 6. Unclear how much Klonopin is required at facility.  If taking regularly would restart this as well to prevent possible withdrawal. 7. Patient to sign release for obtaining records from [place].   8. MRI of the brain with and without contrast 9. If patient remains stable on above regimen, may consider discharge.   Will likely benefit from prolonged EEG monitoring after discharge.  To follow up with neurology on an outpatient basis.    --Psych consult today and rec no change in home psych medication   # Hx of bipolar disorder and schizophrenia  --resume home psych meds, Cogentin, Depakote, Lexapro, Seroquel, Haldol, Klonopin, Ativan.  Also on Invega IM injection. --d/c Wellbutrin  # Hx of headache --Resume home Depakote and Imitrex   #  HLD # GERD with hiatal hernia # Chronic nausea --IV zofran # Chronic constipation   DVT prophylaxis: Lovenox SQ Code Status: Full code  Family Communication: not today Disposition Plan: Back to group home in 1-2 days   Subjective and Interval History:  Woke up this morning and completely coherent, though elusive in answering history question.  Does not remember events from yesterday.  Complained of some headache and being hungry, otherwise, No fever, dyspnea, chest pain, abdominal pain, N/V/D, dysuria.   Objective: Vitals:   06/25/19 2006 06/25/19 2110 06/26/19 0832 06/26/19 1435  BP: (!) 156/98 (!) 151/81 116/70 110/82  Pulse: 62 (!) 57 61 92  Resp: 16 18 18 18   Temp:  97.8 F (36.6 C) (!) 97.3 F (36.3 C) 98.1 F (36.7 C)  TempSrc:  Oral Oral Oral  SpO2: 97% 98% 98% 97%  Weight:      Height:       No intake or output data in the 24 hours ending 06/26/19 1951 Filed Weights   06/25/19 1236  Weight: 104.3 kg    Examination:   Constitutional: NAD, AAOx3, conversant, coherent HEENT: conjunctivae and lids normal, EOMI CV: RRR no M,R,G. Distal pulses +2.  No cyanosis.   RESP: CTA B/L, normal respiratory effort  GI: +BS, NTND Extremities: No effusions, edema, or tenderness in BLE SKIN: warm, dry and intact Neuro: II - XII grossly intact.  Sensation intact Psych: Normal mood and affect.  Appropriate judgement and reason   Data Reviewed: I have personally reviewed following labs and imaging studies  CBC: Recent Labs  Lab 06/25/19 1310 06/26/19  0515  WBC 5.3 5.0  NEUTROABS 2.6  --   HGB 15.0 14.7  HCT 43.8 41.4  MCV 84.9 84.7  PLT 180 174   Basic Metabolic Panel: Recent Labs  Lab 06/25/19 1310 06/26/19 0515  NA 136 140  K 3.8 3.4*  CL 99 102  CO2 27 27  GLUCOSE 120* 92  BUN 8 11  CREATININE 0.97 0.70  CALCIUM 8.8* 9.0  MG  --  2.0   GFR: Estimated Creatinine Clearance: 151.1 mL/min (by C-G formula based on SCr of 0.7 mg/dL). Liver Function  Tests: Recent Labs  Lab 06/25/19 1310  AST 19  ALT 10  ALKPHOS 98  BILITOT 0.9  PROT 7.0  ALBUMIN 3.8   No results for input(s): LIPASE, AMYLASE in the last 168 hours. No results for input(s): AMMONIA in the last 168 hours. Coagulation Profile: No results for input(s): INR, PROTIME in the last 168 hours. Cardiac Enzymes: No results for input(s): CKTOTAL, CKMB, CKMBINDEX, TROPONINI in the last 168 hours. BNP (last 3 results) No results for input(s): PROBNP in the last 8760 hours. HbA1C: No results for input(s): HGBA1C in the last 72 hours. CBG: No results for input(s): GLUCAP in the last 168 hours. Lipid Profile: No results for input(s): CHOL, HDL, LDLCALC, TRIG, CHOLHDL, LDLDIRECT in the last 72 hours. Thyroid Function Tests: No results for input(s): TSH, T4TOTAL, FREET4, T3FREE, THYROIDAB in the last 72 hours. Anemia Panel: No results for input(s): VITAMINB12, FOLATE, FERRITIN, TIBC, IRON, RETICCTPCT in the last 72 hours. Sepsis Labs: No results for input(s): PROCALCITON, LATICACIDVEN in the last 168 hours.  Recent Results (from the past 240 hour(s))  Respiratory Panel by RT PCR (Flu A&B, Covid) - Nasopharyngeal Swab     Status: None   Collection Time: 06/25/19  3:32 PM   Specimen: Nasopharyngeal Swab  Result Value Ref Range Status   SARS Coronavirus 2 by RT PCR NEGATIVE NEGATIVE Final    Comment: (NOTE) SARS-CoV-2 target nucleic acids are NOT DETECTED. The SARS-CoV-2 RNA is generally detectable in upper respiratoy specimens during the acute phase of infection. The lowest concentration of SARS-CoV-2 viral copies this assay can detect is 131 copies/mL. A negative result does not preclude SARS-Cov-2 infection and should not be used as the sole basis for treatment or other patient management decisions. A negative result may occur with  improper specimen collection/handling, submission of specimen other than nasopharyngeal swab, presence of viral mutation(s) within  the areas targeted by this assay, and inadequate number of viral copies (<131 copies/mL). A negative result must be combined with clinical observations, patient history, and epidemiological information. The expected result is Negative. Fact Sheet for Patients:  https://www.moore.com/ Fact Sheet for Healthcare Providers:  https://www.young.biz/ This test is not yet ap proved or cleared by the [place] FDA and  has been authorized for detection and/or diagnosis of SARS-CoV-2 by FDA under an Emergency Use Authorization (EUA). This EUA will remain  in effect (meaning this test can be used) for the duration of the COVID-19 declaration under Section 564(b)(1) of the Act, 21 U.S.C. section 360bbb-3(b)(1), unless the authorization is terminated or revoked sooner.    Influenza A by PCR NEGATIVE NEGATIVE Final   Influenza B by PCR NEGATIVE NEGATIVE Final    Comment: (NOTE) The Xpert Xpress SARS-CoV-2/FLU/RSV assay is intended as an aid in  the diagnosis of influenza from Nasopharyngeal swab specimens and  should not be used as a sole basis for treatment. Nasal washings and  aspirates are unacceptable for  Xpert Xpress SARS-CoV-2/FLU/RSV  testing. Fact Sheet for Patients: PinkCheek.be Fact Sheet for Healthcare Providers: GravelBags.it This test is not yet approved or cleared by the [place] FDA and  has been authorized for detection and/or diagnosis of SARS-CoV-2 by  FDA under an Emergency Use Authorization (EUA). This EUA will remain  in effect (meaning this test can be used) for the duration of the  Covid-19 declaration under Section 564(b)(1) of the Act, 21  U.S.C. section 360bbb-3(b)(1), unless the authorization is  terminated or revoked. Performed at [facility], [address] [place], [place] [address]   MRSA PCR Screening     Status: None   Collection Time: 06/25/19   9:17 PM   Specimen: Nasopharyngeal  Result Value Ref Range Status   MRSA by PCR NEGATIVE NEGATIVE Final    Comment:        The GeneXpert MRSA Assay (FDA approved for NASAL specimens only), is one component of a comprehensive MRSA colonization surveillance program. It is not intended to diagnose MRSA infection nor to guide or monitor treatment for MRSA infections. Performed at [facility], [address], [place], [place] [address]       Radiology Studies: EEG  Result Date: 06/26/2019 Alexis Goodell, MD     06/26/2019  5:36 PM ELECTROENCEPHALOGRAM REPORT Patient: Veron Senner       Room #: 150A-AA EEG No. ID: [id number] Age: 45 y.o.        Sex: male Referring Physician: Billie Ruddy Report Date:  06/26/2019       Interpreting Physician: Alexis Goodell History: Lanson Randle is an 44 y.o. male with seizure like activity Medications: Depakote, Neurontin Conditions of Recording:  This is a 21 channel routine scalp EEG performed with bipolar and monopolar montages arranged in accordance to the international 10/20 system of electrode placement. One channel was dedicated to EKG recording. The patient is in the awake state. Description:  The waking background activity consists of a low voltage, symmetrical, fairly well organized, 7 Hz theta activity, seen from the parieto-occipital and posterior temporal regions.  Low voltage fast activity, poorly organized, is seen anteriorly and is at times superimposed on more posterior regions.  A mixture of theta and alpha rhythms are seen from the central and temporal regions with theta activity being most predominant. The patient does not drowse or sleep. No epileptiform activity is noted.  Hyperventilation was not performed.  Intermittent photic stimulation was performed but failed to illicit any change in the tracing. IMPRESSION: This is an abnormal EEG secondary to posterior background slowing.  This finding may be seen with a diffuse gray matter  disturbance that is etiologically nonspecific, but may include a dementia, among other possibilities.  No epileptiform activity is noted.  Alexis Goodell, MD Neurology [phone] 06/26/2019, 5:32 PM   EEG  Result Date: 06/25/2019 Alexis Goodell, MD     06/25/2019  6:06 PM ELECTROENCEPHALOGRAM REPORT Patient: Demitrus Francisco       Room #: TK16W EEG No. ID: [id number] Age: 44 y.o.        Sex: male Referring Physician: Billie Ruddy Report Date:  06/25/2019       Interpreting Physician: Alexis Goodell History: Candy Ziegler is an 44 y.o. male with altered mental status Medications: Depakote, Neurontin, Seroquel, Haldol, Klonopin, Cogentin, Wellbutrin Conditions of Recording:  This is a 21 channel routine scalp EEG performed with bipolar and monopolar montages arranged in accordance to the international 10/20 system of electrode placement. One channel was dedicated to EKG recording.  The patient is in the altered state and uncooperative. Description:  Artifact is prominent during the recording often obscuring the background rhythm. When able to be visualized the background is slow and poorly organized.  Rarely a low voltage, polymorphic delta rhythm is seen that is diffusely distributed and continuous. No epileptiform activity is noted.  Hyperventilation was not performed. Intermittent photic stimulation was performed but failed to illicit any change in the tracing. IMPRESSION: This is a technically difficult record due to the predominance of muscle and movement artifact.  The underlying background rhythm appears slow.  No epileptiform activity is noted.  Thana Farr, MD Neurology [phone] 06/25/2019, 5:54 PM   CT Head Wo Contrast  Result Date: 06/25/2019 CLINICAL DATA:  Altered mental status EXAM: CT HEAD WITHOUT CONTRAST TECHNIQUE: Contiguous axial images were obtained from the base of the skull through the vertex without intravenous contrast. COMPARISON:  06/02/2019 FINDINGS: Brain: No evidence of acute  infarction, hemorrhage, hydrocephalus, extra-axial collection or mass lesion/mass effect. Vascular: No hyperdense vessel or unexpected calcification. Skull: Normal. Negative for fracture or focal lesion. Sinuses/Orbits: Mucous retention cyst versus sinonasal polyp within the left maxillary sinus, unchanged. Orbital structures intact. Other: None. IMPRESSION: No acute intracranial findings. Electronically Signed   By: Duanne Guess D.O.   On: 06/25/2019 13:07     Scheduled Meds: . divalproex  1,000 mg Oral BID  . [START ON 06/27/2019] divalproex  1,000 mg Oral QHS  . [START ON 06/27/2019] divalproex  500 mg Oral Daily  . enoxaparin (LOVENOX) injection  40 mg Subcutaneous Q24H  . gabapentin  400 mg Oral TID   Continuous Infusions:   LOS: 1 day     Darlin Priestly, MD [facility] If 7PM-7AM, please contact night-coverage 06/26/2019, 7:51 PM

## 2019-06-26 NOTE — Progress Notes (Signed)
Pt refused  To have  Mri done

## 2019-06-26 NOTE — Progress Notes (Signed)
eeg completed ° °

## 2019-06-26 NOTE — Procedures (Signed)
ELECTROENCEPHALOGRAM REPORT   Patient: Russell Buchanan       Room #: 150A-AA EEG No. ID: [id number] Age: 44 y.o.        Sex: male Referring Physician: Fran Lowes Report Date:  06/26/2019        Interpreting Physician: Thana Farr  History: Russell Buchanan is an 44 y.o. male with seizure like activity  Medications:  Depakote, Neurontin  Conditions of Recording:  This is a 21 channel routine scalp EEG performed with bipolar and monopolar montages arranged in accordance to the international 10/20 system of electrode placement. One channel was dedicated to EKG recording.  The patient is in the awake state.  Description:  The waking background activity consists of a low voltage, symmetrical, fairly well organized, 7 Hz theta activity, seen from the parieto-occipital and posterior temporal regions.  Low voltage fast activity, poorly organized, is seen anteriorly and is at times superimposed on more posterior regions.  A mixture of theta and alpha rhythms are seen from the central and temporal regions with theta activity being most predominant. The patient does not drowse or sleep. No epileptiform activity is noted.   Hyperventilation was not performed.  Intermittent photic stimulation was performed but failed to illicit any change in the tracing.   IMPRESSION: This is an abnormal EEG secondary to posterior background slowing.  This finding may be seen with a diffuse gray matter disturbance that is etiologically nonspecific, but may include a dementia, among other possibilities.  No epileptiform activity is noted.      Thana Farr, MD Neurology [phone] 06/26/2019, 5:32 PM

## 2019-06-26 NOTE — Progress Notes (Signed)
Subjective: Patient awake and alert today.  Reports feeling at baseline.  Does not recall events of yesterday.  Objective: Current vital signs: BP 116/70 (BP Location: Right Arm)   Pulse 61   Temp (!) 97.3 F (36.3 C) (Oral)   Resp 18   Ht 6\' 1"  (1.854 m)   Wt 104.3 kg   SpO2 98%   BMI 30.34 kg/m  Vital signs in last 24 hours: Temp:  [97.3 F (36.3 C)-98.7 F (37.1 C)] 97.3 F (36.3 C) (01/22 0832) Pulse Rate:  [57-63] 61 (01/22 0832) Resp:  [13-18] 18 (01/22 0832) BP: (116-156)/(70-99) 116/70 (01/22 0832) SpO2:  [95 %-98 %] 98 % (01/22 0832) Weight:  [104.3 kg] 104.3 kg (01/21 1236)  Intake/Output from previous day: 01/21 0701 - 01/22 0700 In: 94.1 [IV Piggyback:94.1] Out: -  Intake/Output this shift: No intake/output data recorded. Nutritional status:  Diet Order            Diet NPO time specified Except for: Ice Chips  Diet effective now              Neurologic Exam: Mental Status: Alert, oriented, thought content appropriate.  Speech fluent without evidence of aphasia.  Able to follow 3 step commands without difficulty. Cranial Nerves: II:  Visual fields grossly normal, pupils equal, round, reactive to light and accommodation III,IV, VI: ptosis not present, extra-ocular motions intact bilaterally V,VII: smile symmetric, facial light touch sensation normal bilaterally VIII: hearing normal bilaterally IX,X: gag reflex present XI: bilateral shoulder shrug XII: midline tongue extension Motor: Right : Upper extremity   5/5    Left:     Upper extremity   5/5  Lower extremity   5/5     Lower extremity   5/5 Tone and bulk:normal tone throughout; no atrophy noted Sensory: Pinprick and light touch intact throughout, bilaterally Cerebellar: Normal finger-to-nose testing bilaterally   Lab Results: Basic Metabolic Panel: Recent Labs  Lab 06/25/19 1310 06/26/19 0515  NA 136 140  K 3.8 3.4*  CL 99 102  CO2 27 27  GLUCOSE 120* 92  BUN 8 11  CREATININE  0.97 0.70  CALCIUM 8.8* 9.0  MG  --  2.0    Liver Function Tests: Recent Labs  Lab 06/25/19 1310  AST 19  ALT 10  ALKPHOS 98  BILITOT 0.9  PROT 7.0  ALBUMIN 3.8   No results for input(s): LIPASE, AMYLASE in the last 168 hours. No results for input(s): AMMONIA in the last 168 hours.  CBC: Recent Labs  Lab 06/25/19 1310 06/26/19 0515  WBC 5.3 5.0  NEUTROABS 2.6  --   HGB 15.0 14.7  HCT 43.8 41.4  MCV 84.9 84.7  PLT 180 174    Cardiac Enzymes: No results for input(s): CKTOTAL, CKMB, CKMBINDEX, TROPONINI in the last 168 hours.  Lipid Panel: No results for input(s): CHOL, TRIG, HDL, CHOLHDL, VLDL, LDLCALC in the last 168 hours.  CBG: No results for input(s): GLUCAP in the last 168 hours.  Microbiology: Results for orders placed or performed during the hospital encounter of 06/25/19  Respiratory Panel by RT PCR (Flu A&B, Covid) - Nasopharyngeal Swab     Status: None   Collection Time: 06/25/19  3:32 PM   Specimen: Nasopharyngeal Swab  Result Value Ref Range Status   SARS Coronavirus 2 by RT PCR NEGATIVE NEGATIVE Final    Comment: (NOTE) SARS-CoV-2 target nucleic acids are NOT DETECTED. The SARS-CoV-2 RNA is generally detectable in upper respiratoy specimens during the acute phase  of infection. The lowest concentration of SARS-CoV-2 viral copies this assay can detect is 131 copies/mL. A negative result does not preclude SARS-Cov-2 infection and should not be used as the sole basis for treatment or other patient management decisions. A negative result may occur with  improper specimen collection/handling, submission of specimen other than nasopharyngeal swab, presence of viral mutation(s) within the areas targeted by this assay, and inadequate number of viral copies (<131 copies/mL). A negative result must be combined with clinical observations, patient history, and epidemiological information. The expected result is Negative. Fact Sheet for Patients:   https://www.moore.com/ Fact Sheet for Healthcare Providers:  https://www.young.biz/ This test is not yet ap proved or cleared by the [place] FDA and  has been authorized for detection and/or diagnosis of SARS-CoV-2 by FDA under an Emergency Use Authorization (EUA). This EUA will remain  in effect (meaning this test can be used) for the duration of the COVID-19 declaration under Section 564(b)(1) of the Act, 21 U.S.C. section 360bbb-3(b)(1), unless the authorization is terminated or revoked sooner.    Influenza A by PCR NEGATIVE NEGATIVE Final   Influenza B by PCR NEGATIVE NEGATIVE Final    Comment: (NOTE) The Xpert Xpress SARS-CoV-2/FLU/RSV assay is intended as an aid in  the diagnosis of influenza from Nasopharyngeal swab specimens and  should not be used as a sole basis for treatment. Nasal washings and  aspirates are unacceptable for Xpert Xpress SARS-CoV-2/FLU/RSV  testing. Fact Sheet for Patients: https://www.moore.com/ Fact Sheet for Healthcare Providers: https://www.young.biz/ This test is not yet approved or cleared by the [place] FDA and  has been authorized for detection and/or diagnosis of SARS-CoV-2 by  FDA under an Emergency Use Authorization (EUA). This EUA will remain  in effect (meaning this test can be used) for the duration of the  Covid-19 declaration under Section 564(b)(1) of the Act, 21  U.S.C. section 360bbb-3(b)(1), unless the authorization is  terminated or revoked. Performed at [facility], [address] [place], [place] [address]   MRSA PCR Screening     Status: None   Collection Time: 06/25/19  9:17 PM   Specimen: Nasopharyngeal  Result Value Ref Range Status   MRSA by PCR NEGATIVE NEGATIVE Final    Comment:        The GeneXpert MRSA Assay (FDA approved for NASAL specimens only), is one component of a comprehensive MRSA colonization surveillance  program. It is not intended to diagnose MRSA infection nor to guide or monitor treatment for MRSA infections. Performed at [facility], [address] [place], [place] [address]     Coagulation Studies: No results for input(s): LABPROT, INR in the last 72 hours.  Imaging: EEG  Result Date: 06/25/2019 Russell Farr, MD     06/25/2019  6:06 PM ELECTROENCEPHALOGRAM REPORT Patient: Russell Buchanan       Room #: IB70W EEG No. ID: [id number] Age: 44 y.o.        Sex: male Referring Physician: Fran Lowes Report Date:  06/25/2019       Interpreting Physician: Russell Buchanan History: Tristyn Demarest is an 44 y.o. male with altered mental status Medications: Depakote, Neurontin, Seroquel, Haldol, Klonopin, Cogentin, Wellbutrin Conditions of Recording:  This is a 21 channel routine scalp EEG performed with bipolar and monopolar montages arranged in accordance to the international 10/20 system of electrode placement. One channel was dedicated to EKG recording. The patient is in the altered state and uncooperative. Description:  Artifact is prominent during the recording often obscuring  the background rhythm. When able to be visualized the background is slow and poorly organized.  Rarely a low voltage, polymorphic delta rhythm is seen that is diffusely distributed and continuous. No epileptiform activity is noted.  Hyperventilation was not performed. Intermittent photic stimulation was performed but failed to illicit any change in the tracing. IMPRESSION: This is a technically difficult record due to the predominance of muscle and movement artifact.  The underlying background rhythm appears slow.  No epileptiform activity is noted.  Russell Farr, MD Neurology [phone] 06/25/2019, 5:54 PM   CT Head Wo Contrast  Result Date: 06/25/2019 CLINICAL DATA:  Altered mental status EXAM: CT HEAD WITHOUT CONTRAST TECHNIQUE: Contiguous axial images were obtained from the base of the skull through the vertex  without intravenous contrast. COMPARISON:  06/02/2019 FINDINGS: Brain: No evidence of acute infarction, hemorrhage, hydrocephalus, extra-axial collection or mass lesion/mass effect. Vascular: No hyperdense vessel or unexpected calcification. Skull: Normal. Negative for fracture or focal lesion. Sinuses/Orbits: Mucous retention cyst versus sinonasal polyp within the left maxillary sinus, unchanged. Orbital structures intact. Other: None. IMPRESSION: No acute intracranial findings. Electronically Signed   By: Duanne Guess D.O.   On: 06/25/2019 13:07    Medications:  I have reviewed the patient's current medications. Scheduled: . enoxaparin (LOVENOX) injection  40 mg Subcutaneous Q24H    Assessment/Plan: 44 y.o. male with a history of BPD and schizophrenia presenting with altered mental status with question of possible seizure activity.  Patient has multiple features on examination and since presentation that suggests that this is a functional presentation but can not rule out a prolonged post-ictal state.  Patient on Depakote and Neurontin prior to admission.  Depakote level of 84.  Had seizures as a child.  Head CT reviewed and shows no acute changes.   Today patient at baseline.  EEG reviewed and with significant artifact but did not appear to show epileptiform activity with some question of slowing.  Patient loaded with Keppra.  Recommendations: 1. Will restart Depakote with 1000mg  BID to be administered today and returning back to 500mg  in the morning and 1000mg  at night starting tomorrow. 2. Repeat EEG today 3. Restart Neurontin at 400 mg TID 4. Will not continue Keppra 5. Agree with discontinuation of Wellbutrin 6. Unclear how much Klonopin is required at facility.  If taking regularly would restart this as well to prevent possible withdrawal. 7. Patient to sign release for obtaining records from [place].   8. MRI of the brain with and without contrast 9. If patient remains stable on  above regimen, may consider discharge.  Will likely benefit from prolonged EEG monitoring after discharge.  To follow up with neurology on an outpatient basis.     LOS: 1 day   , MD Neurology [phone] 06/26/2019  12:22 PM

## 2019-06-26 NOTE — Consult Note (Signed)
[facility] Face-to-Face Psychiatry Consult   Reason for Consult:  schizophrenia Referring Physician: Dr. Fran Lowes Patient Identification: Russell Buchanan MRN:  [id number] Principal Diagnosis: AMS (altered mental status) Diagnosis:  Principal Problem:   AMS (altered mental status) Active Problems:   Schizophrenia (HCC)   Bipolar disorder (HCC)   Antisocial personality disorder (HCC)   Total Time spent with patient: 45 minutes  Subjective:   Russell Buchanan is a 44 y.o. male patient admitted with altered mental status.  HPI: Patient is a 44 year old male with history of schizophrenia and questionable seizure history presents with altered mental status.  Per chart review patient was noncommunicative but making somewhat purposeful movements.  Medical team was working up for potential seizure however was unable to see any seizure activity on EEG.   Upon evaluation this morning patient has a change in mental status and is, talking,  and cooperative.  He states that he is unsure what happened but he thinks that he may have had a seizure.  He denies any recent psychiatric issues or changes in his psychiatric medications.  Denies any suicidal ideation or homicidal ideation.  He does acknowledge chronic hallucinations including auditory hallucinations at night.  Patient reports that he has had these for years and they are not distressing to him at this time.  Patient is currently taking his medication and compliant with his outpatient appointments  Past Psychiatric History: Patient endorses a history of schizophrenia with multiple hospitalizations previous suicide attempts as well as previous psychotic symptoms.  He reports he relatively stable group home for quite some time now.  Risk to Self:  No Risk to Others:  No Prior Inpatient Therapy:  Yes Prior Outpatient Therapy:  Yes  Past Medical History:  Past Medical History:  Diagnosis Date  . Antisocial personality disorder (HCC)   . Bipolar disorder  (HCC)   . Environmental and seasonal allergies   . GERD (gastroesophageal reflux disease)   . Headache   . Hyperlipidemia   . Infected elbow (HCC)   . Schizophrenia (HCC)   . Tremors of nervous system     Past Surgical History:  Procedure Laterality Date  . CLOSED REDUCTION WRIST FRACTURE Left   . HARDWARE REMOVAL Right 04/14/2018   Procedure: HARDWARE REMOVAL-RIGHT ELBOW;  Surgeon: Deeann Saint, MD;  Location: [facility] ORS;  Service: Orthopedics;  Laterality: Right;  . ORIF ELBOW FRACTURE Right   . SCALP LACERATION REPAIR    . WRIST RECONSTRUCTION Left    Family History:  Family History  Family history unknown: Yes   Family Psychiatric  History:  Social History:  Social History   Substance and Sexual Activity  Alcohol Use No     Social History   Substance and Sexual Activity  Drug Use Not Currently    Social History   Socioeconomic History  . Marital status: Single    Spouse name: Not on file  . Number of children: Not on file  . Years of education: Not on file  . Highest education level: Not on file  Occupational History  . Not on file  Tobacco Use  . Smoking status: Current Every Day Smoker    Packs/day: 0.50    Years: 7.00    Pack years: 3.50    Types: Cigarettes  . Smokeless tobacco: Former Neurosurgeon    Types: Chew, Snuff  Substance and Sexual Activity  . Alcohol use: No  . Drug use: Not Currently  . Sexual activity: Not on file  Other Topics Concern  . Not  on file  Social History Narrative  . Not on file   Social Determinants of Health   Financial Resource Strain:   . Difficulty of Paying Living Expenses: Not on file  Food Insecurity:   . Worried About Charity fundraiser in the Last Year: Not on file  . Ran Out of Food in the Last Year: Not on file  Transportation Needs:   . Lack of Transportation (Medical): Not on file  . Lack of Transportation (Non-Medical): Not on file  Physical Activity:   . Days of Exercise per Week: Not on file  . Minutes  of Exercise per Session: Not on file  Stress:   . Feeling of Stress : Not on file  Social Connections:   . Frequency of Communication with Friends and Family: Not on file  . Frequency of Social Gatherings with Friends and Family: Not on file  . Attends Religious Services: Not on file  . Active Member of Clubs or Organizations: Not on file  . Attends Archivist Meetings: Not on file  . Marital Status: Not on file   Additional Social History: Patient currently living at the group home.  Reports liking his placement there and the staff members that he works with.    Allergies:   Allergies  Allergen Reactions  . Bee Venom Hives    Labs:  Results for orders placed or performed during the hospital encounter of 06/25/19 (from the past 48 hour(s))  CBC with Differential/Platelet     Status: None   Collection Time: 06/25/19  1:10 PM  Result Value Ref Range   WBC 5.3 4.0 - 10.5 K/uL   RBC 5.16 4.22 - 5.81 MIL/uL   Hemoglobin 15.0 13.0 - 17.0 g/dL   HCT 43.8 39.0 - 52.0 %   MCV 84.9 80.0 - 100.0 fL   MCH 29.1 26.0 - 34.0 pg   MCHC 34.2 30.0 - 36.0 g/dL   RDW 13.2 11.5 - 15.5 %   Platelets 180 150 - 400 K/uL   nRBC 0.0 0.0 - 0.2 %   Neutrophils Relative % 49 %   Neutro Abs 2.6 1.7 - 7.7 K/uL   Lymphocytes Relative 40 %   Lymphs Abs 2.1 0.7 - 4.0 K/uL   Monocytes Relative 9 %   Monocytes Absolute 0.5 0.1 - 1.0 K/uL   Eosinophils Relative 1 %   Eosinophils Absolute 0.1 0.0 - 0.5 K/uL   Basophils Relative 0 %   Basophils Absolute 0.0 0.0 - 0.1 K/uL   Immature Granulocytes 1 %   Abs Immature Granulocytes 0.05 0.00 - 0.07 K/uL    Comment: Performed at [facility], [address] [place], [place] [address]  Comprehensive metabolic panel     Status: Abnormal   Collection Time: 06/25/19  1:10 PM  Result Value Ref Range   Sodium 136 135 - 145 mmol/L   Potassium 3.8 3.5 - 5.1 mmol/L    Comment: HEMOLYSIS AT THIS LEVEL MAY AFFECT RESULT   Chloride 99 98 - 111  mmol/L   CO2 27 22 - 32 mmol/L   Glucose, Bld 120 (H) 70 - 99 mg/dL   BUN 8 6 - 20 mg/dL   Creatinine, Ser 0.97 0.61 - 1.24 mg/dL   Calcium 8.8 (L) 8.9 - 10.3 mg/dL   Total Protein 7.0 6.5 - 8.1 g/dL   Albumin 3.8 3.5 - 5.0 g/dL   AST 19 15 - 41 U/L   ALT 10 0 - 44 U/L  Alkaline Phosphatase 98 38 - 126 U/L   Total Bilirubin 0.9 0.3 - 1.2 mg/dL   GFR calc non Af Amer >60 >60 mL/min   GFR calc Af Amer >60 >60 mL/min   Anion gap 10 5 - 15    Comment: Performed at [facility], [address] [place], [place] [address]  Valproic acid level     Status: None   Collection Time: 06/25/19  1:10 PM  Result Value Ref Range   Valproic Acid Lvl 84 50.0 - 100.0 ug/mL    Comment: Performed at [facility], [address] [place], [place] [address]  Urinalysis, Complete w Microscopic     Status: Abnormal   Collection Time: 06/25/19  3:32 PM  Result Value Ref Range   Color, Urine YELLOW (A) YELLOW   APPearance HAZY (A) CLEAR   Specific Gravity, Urine 1.012 1.005 - 1.030   pH 6.0 5.0 - 8.0   Glucose, UA NEGATIVE NEGATIVE mg/dL   Hgb urine dipstick NEGATIVE NEGATIVE   Bilirubin Urine NEGATIVE NEGATIVE   Ketones, ur 5 (A) NEGATIVE mg/dL   Protein, ur 30 (A) NEGATIVE mg/dL   Nitrite NEGATIVE NEGATIVE   Leukocytes,Ua MODERATE (A) NEGATIVE   RBC / HPF 0-5 0 - 5 RBC/hpf   WBC, UA >50 (H) 0 - 5 WBC/hpf   Bacteria, UA NONE SEEN NONE SEEN   Squamous Epithelial / LPF NONE SEEN 0 - 5   Mucus PRESENT     Comment: Performed at [facility], [address], [place], [place] [address]  Respiratory Panel by RT PCR (Flu A&B, Covid) - Nasopharyngeal Swab     Status: None   Collection Time: 06/25/19  3:32 PM   Specimen: Nasopharyngeal Swab  Result Value Ref Range   SARS Coronavirus 2 by RT PCR NEGATIVE NEGATIVE    Comment: (NOTE) SARS-CoV-2 target nucleic acids are NOT DETECTED. The SARS-CoV-2 RNA is generally detectable in upper respiratoy specimens during the acute  phase of infection. The lowest concentration of SARS-CoV-2 viral copies this assay can detect is 131 copies/mL. A negative result does not preclude SARS-Cov-2 infection and should not be used as the sole basis for treatment or other patient management decisions. A negative result may occur with  improper specimen collection/handling, submission of specimen other than nasopharyngeal swab, presence of viral mutation(s) within the areas targeted by this assay, and inadequate number of viral copies (<131 copies/mL). A negative result must be combined with clinical observations, patient history, and epidemiological information. The expected result is Negative. Fact Sheet for Patients:  https://www.moore.com/ Fact Sheet for Healthcare Providers:  https://www.young.biz/ This test is not yet ap proved or cleared by the [place] FDA and  has been authorized for detection and/or diagnosis of SARS-CoV-2 by FDA under an Emergency Use Authorization (EUA). This EUA will remain  in effect (meaning this test can be used) for the duration of the COVID-19 declaration under Section 564(b)(1) of the Act, 21 U.S.C. section 360bbb-3(b)(1), unless the authorization is terminated or revoked sooner.    Influenza A by PCR NEGATIVE NEGATIVE   Influenza B by PCR NEGATIVE NEGATIVE    Comment: (NOTE) The Xpert Xpress SARS-CoV-2/FLU/RSV assay is intended as an aid in  the diagnosis of influenza from Nasopharyngeal swab specimens and  should not be used as a sole basis for treatment. Nasal washings and  aspirates are unacceptable for Xpert Xpress SARS-CoV-2/FLU/RSV  testing. Fact Sheet for Patients: https://www.moore.com/ Fact Sheet for Healthcare Providers: https://www.young.biz/ This test  is not yet approved or cleared by the [place] and  has been authorized for detection and/or diagnosis of SARS-CoV-2 by  FDA under an  Emergency Use Authorization (EUA). This EUA will remain  in effect (meaning this test can be used) for the duration of the  Covid-19 declaration under Section 564(b)(1) of the Act, 21  U.S.C. section 360bbb-3(b)(1), unless the authorization is  terminated or revoked. Performed at [facility], [address] [place], [place] [address]   Urine Drug Screen, Qualitative [facility] only)     Status: None   Collection Time: 06/25/19  3:32 PM  Result Value Ref Range   Tricyclic, Ur Screen NONE DETECTED NONE DETECTED   Amphetamines, Ur Screen NONE DETECTED NONE DETECTED   MDMA (Ecstasy)Ur Screen NONE DETECTED NONE DETECTED   Cocaine Metabolite,Ur Sultan NONE DETECTED NONE DETECTED   Opiate, Ur Screen NONE DETECTED NONE DETECTED   Phencyclidine (PCP) Ur S NONE DETECTED NONE DETECTED   Cannabinoid 50 Ng, Ur Kramer NONE DETECTED NONE DETECTED   Barbiturates, Ur Screen NONE DETECTED NONE DETECTED   Benzodiazepine, Ur Scrn NONE DETECTED NONE DETECTED   Methadone Scn, Ur NONE DETECTED NONE DETECTED    Comment: (NOTE) Tricyclics + metabolites, urine    Cutoff 1000 ng/mL Amphetamines + metabolites, urine  Cutoff 1000 ng/mL MDMA (Ecstasy), urine              Cutoff 500 ng/mL Cocaine Metabolite, urine          Cutoff 300 ng/mL Opiate + metabolites, urine        Cutoff 300 ng/mL Phencyclidine (PCP), urine         Cutoff 25 ng/mL Cannabinoid, urine                 Cutoff 50 ng/mL Barbiturates + metabolites, urine  Cutoff 200 ng/mL Benzodiazepine, urine              Cutoff 200 ng/mL Methadone, urine                   Cutoff 300 ng/mL The urine drug screen provides only a preliminary, unconfirmed analytical test result and should not be used for non-medical purposes. Clinical consideration and professional judgment should be applied to any positive drug screen result due to possible interfering substances. A more specific alternate chemical method must be used in order to obtain a confirmed analytical  result. Gas chromatography / mass spectrometry (GC/MS) is the preferred confirmat ory method. Performed at [facility], [address] [place], [place] [address]   MRSA PCR Screening     Status: None   Collection Time: 06/25/19  9:17 PM   Specimen: Nasopharyngeal  Result Value Ref Range   MRSA by PCR NEGATIVE NEGATIVE    Comment:        The GeneXpert MRSA Assay (FDA approved for NASAL specimens only), is one component of a comprehensive MRSA colonization surveillance program. It is not intended to diagnose MRSA infection nor to guide or monitor treatment for MRSA infections. Performed at [facility], [address] [place], [place] [address]   Basic metabolic panel     Status: Abnormal   Collection Time: 06/26/19  5:15 AM  Result Value Ref Range   Sodium 140 135 - 145 mmol/L   Potassium 3.4 (L) 3.5 - 5.1 mmol/L   Chloride 102 98 - 111 mmol/L   CO2 27 22 - 32 mmol/L   Glucose, Bld 92 70 - 99  mg/dL   BUN 11 6 - 20 mg/dL   Creatinine, Ser 1.61 0.61 - 1.24 mg/dL   Calcium 9.0 8.9 - 09.6 mg/dL   GFR calc non Af Amer >60 >60 mL/min   GFR calc Af Amer >60 >60 mL/min   Anion gap 11 5 - 15    Comment: Performed at [facility], [address] [place], [place] [address]  CBC     Status: None   Collection Time: 06/26/19  5:15 AM  Result Value Ref Range   WBC 5.0 4.0 - 10.5 K/uL   RBC 4.89 4.22 - 5.81 MIL/uL   Hemoglobin 14.7 13.0 - 17.0 g/dL   HCT 98.1 19.1 - 47.8 %   MCV 84.7 80.0 - 100.0 fL   MCH 30.1 26.0 - 34.0 pg   MCHC 35.5 30.0 - 36.0 g/dL   RDW 29.5 62.1 - 30.8 %   Platelets 174 150 - 400 K/uL   nRBC 0.0 0.0 - 0.2 %    Comment: Performed at [facility], [address], [place], [place] [address]  Magnesium     Status: None   Collection Time: 06/26/19  5:15 AM  Result Value Ref Range   Magnesium 2.0 1.7 - 2.4 mg/dL    Comment: Performed at [facility], [address] [place], [place] [address]  HIV Antibody  (routine testing w rflx)     Status: None   Collection Time: 06/26/19  5:15 AM  Result Value Ref Range   HIV Screen 4th Generation wRfx NON REACTIVE NON REACTIVE    Comment: Performed at [facility], [address], [place], [place] [address]    Current Facility-Administered Medications  Medication Dose Route Frequency Provider Last Rate Last Admin  . acetaminophen (TYLENOL) tablet 1,000 mg  1,000 mg Oral Q8H PRN Darlin Priestly, MD      . alum & mag hydroxide-simeth (MAALOX/MYLANTA) 200-200-20 MG/5ML suspension 15 mL  15 mL Oral Q6H PRN Darlin Priestly, MD      . calcium carbonate (TUMS - dosed in mg elemental calcium) chewable tablet 200 mg of elemental calcium  1 tablet Oral TID PRN Darlin Priestly, MD      . divalproex (DEPAKOTE) DR tablet 1,000 mg  1,000 mg Oral BID Thana Farr, MD      . Melene Muller ON 06/27/2019] divalproex (DEPAKOTE) DR tablet 1,000 mg  1,000 mg Oral QHS Thana Farr, MD      . Melene Muller ON 06/27/2019] divalproex (DEPAKOTE) DR tablet 500 mg  500 mg Oral Daily Thana Farr, MD      . docusate sodium (COLACE) capsule 100 mg  100 mg Oral BID PRN Darlin Priestly, MD      . enoxaparin (LOVENOX) injection 40 mg  40 mg Subcutaneous Q24H Darlin Priestly, MD   40 mg at 06/25/19 2256  . gabapentin (NEURONTIN) capsule 400 mg  400 mg Oral TID Thana Farr, MD      . guaiFENesin-dextromethorphan [facility] DM) 100-10 MG/5ML syrup 10 mL  10 mL Oral Q6H PRN Darlin Priestly, MD      . ondansetron [facility]) injection 4 mg  4 mg Intravenous Q6H PRN Darlin Priestly, MD      . ondansetron (ZOFRAN-ODT) disintegrating tablet 4 mg  4 mg Oral Q8H PRN Darlin Priestly, MD      . polyethylene glycol (MIRALAX / GLYCOLAX) packet 17 g  17 g Oral BID PRN Darlin Priestly, MD        Musculoskeletal: Strength & Muscle  Tone: within normal limits Gait & Station: normal Patient leans: N/A  Psychiatric Specialty Exam: Physical Exam  Constitutional: He appears well-developed and well-nourished.  HENT:  Head: Normocephalic.   Musculoskeletal:     Cervical back: Normal range of motion.  Psychiatric: He has a normal mood and affect. His behavior is normal.    Review of Systems  Blood pressure 116/70, pulse 61, temperature (!) 97.3 F (36.3 C), temperature source Oral, resp. rate 18, height 6\' 1"  (1.854 m), weight 104.3 kg, SpO2 98 %.Body mass index is 30.34 kg/m.  General Appearance: Casual  Eye Contact:  Good  Speech:  Clear and Coherent  Volume:  Normal  Mood:  Euthymic  Affect:  Appropriate  Thought Process:  Coherent  Orientation:  Full (Time, Place, and Person)  Thought Content:  Logical and Hallucinations: Auditory  Suicidal Thoughts:  No  Homicidal Thoughts:  No  Memory:  Recent;   Fair  Judgement:  Intact  Insight:  Present  Psychomotor Activity:  Normal  Concentration:  Concentration: Fair  Recall:  of Knowledge:  Fair  Language:  Fair  Akathisia:  No  Handed:  Right  AIMS (if indicated):     Assets:  Communication Skills Desire for Improvement Housing Resilience Social Support  ADL's:  Intact  Cognition:  WNL  Sleep:        Treatment Plan Summary: Patient is a 44 year old male with a history of schizophrenia who presents for AMS.  Patient's abrupt change in status this morning points away from a psychiatric cause for his altered mental status.  Furthermore patient has no recent changes in his medication regimen as compliant with his outpatient appointments and medications.  No disturbances of mood at this time.  Patient's condition at the beginning of his admission was not believed to be volitional.  Plan: Continue home doses of psychiatric medications  Continue to search out causes of altered mental status including potential metabolic, infectious or neurologic causes.  Perhaps a longer EEG may be more helpful.  Also patient's urinalysis was somewhat abnormal.   Patient can return back to his group home upon discharge.  Psychiatric diagnosis: Schizoaffective  disorder   Disposition: No evidence of imminent risk to self or others at present.   Patient does not meet criteria for psychiatric inpatient admission. Supportive therapy provided about ongoing stressors. Discussed crisis plan, support from social network, calling 911, coming to the Emergency Department, and calling Suicide Hotline.  55, MD 06/26/2019 12:37 PM

## 2019-06-26 NOTE — Progress Notes (Signed)
Pt resting quietly at present. Pt to have mri this evening/ pt refused mri earlier.  Awake alert and oriented  And talking. No  S/s seizure activity noted today.started diet and tol well.depokote and gabapentin ordered and given today.

## 2019-06-27 LAB — BASIC METABOLIC PANEL
Anion gap: 11 (ref 5–15)
BUN: 15 mg/dL (ref 6–20)
CO2: 26 mmol/L (ref 22–32)
Calcium: 8.9 mg/dL (ref 8.9–10.3)
Chloride: 102 mmol/L (ref 98–111)
Creatinine, Ser: 0.8 mg/dL (ref 0.61–1.24)
GFR calc Af Amer: >60 mL/min (ref >60)
GFR calc non Af Amer: >60 mL/min (ref >60)
Glucose, Bld: 94 mg/dL (ref 70–99)
Potassium: 3.5 mmol/L (ref 3.5–5.1)
Sodium: 139 mmol/L (ref 135–145)

## 2019-06-27 LAB — CBC
HCT: 40.5 % (ref 39.0–52.0)
Hemoglobin: 14 g/dL (ref 13.0–17.0)
MCH: 29.7 pg (ref 26.0–34.0)
MCHC: 34.6 g/dL (ref 30.0–36.0)
MCV: 86 fL (ref 80.0–100.0)
Platelets: 161 10*3/uL (ref 150–400)
RBC: 4.71 MIL/uL (ref 4.22–5.81)
RDW: 13.2 % (ref 11.5–15.5)
WBC: 6.3 10*3/uL (ref 4.0–10.5)
nRBC: 0 % (ref 0.0–0.2)

## 2019-06-27 LAB — MAGNESIUM: Magnesium: 1.9 mg/dL (ref 1.7–2.4)

## 2019-06-27 MED ORDER — DIVALPROEX SODIUM 500 MG PO DR TAB: 1000.0000 mg | DELAYED_RELEASE_TABLET | Freq: Every day | ORAL | Status: DC

## 2019-06-27 MED ORDER — DIVALPROEX SODIUM 500 MG PO DR TAB: 500.0000 mg | DELAYED_RELEASE_TABLET | Freq: Every morning | ORAL | 11 refills | Status: DC

## 2019-06-27 MED ORDER — PROPRANOLOL HCL ER BEADS 80 MG PO CP24: ORAL_CAPSULE | ORAL | Status: DC

## 2019-06-27 MED ORDER — PROPRANOLOL HCL ER 80 MG PO CP24: ORAL_CAPSULE | ORAL | Status: DC

## 2019-06-27 MED ORDER — TIOTROPIUM BROMIDE MONOHYDRATE 18 MCG IN CAPS: ORAL_CAPSULE | RESPIRATORY_TRACT | 12 refills | Status: DC

## 2019-06-27 MED ORDER — MELOXICAM 15 MG PO TABS: 15.0000 mg | ORAL_TABLET | Freq: Every day | ORAL | 3 refills | Status: DC | PRN

## 2019-06-27 NOTE — Progress Notes (Addendum)
Patient is being discharged back to his group home. DC and Rx instructions printed and given to patient to give to his caregiver. IV removed.  NT will help patient get dressed and pack belongings. Group home asks that we call a cab for patient and they pay for it when he gets there.

## 2019-06-27 NOTE — TOC Transition Note (Signed)
Transition of Care [facility]) - CM/SW Discharge Note   Patient Details  Name: Russell Buchanan MRN: [id number] Date of Birth: 07-11-1975  [facility]) CM/SW Contact:  Larwance Rote, LCSW Phone Number: 06/27/2019, 2:50 PM   Clinical Narrative:   Patient going discharging to family care home.  Patient will will be going home by [place] taxi.  Care home notified and guardian.    Patient Goals and CMS Choice     Discharge Placement               Discharge Plan and Services         Social Determinants of Health (SDOH) Interventions    Readmission Risk Interventions No flowsheet data found.

## 2019-06-27 NOTE — Discharge Summary (Signed)
Physician Discharge Summary   Russell Buchanan  male DOB: 1976/01/07  QPY:[id number]  PCP: Remi Haggard, FNP  Admit date: 06/25/2019 Discharge date:   Admitted From: [facility] Disposition:  [place]: Full code  Discharge Instructions    Diet - low sodium heart healthy   Complete by: As directed    Discharge instructions   Complete by: As directed    Please follow up with outpatient neurology for prolonged EEG monitoring after discharge.   Dr. Enzo Bi - -   Increase activity slowly   Complete by: As directed        Hospital Course:  For full details, please see H&P, progress notes, consult notes and ancillary notes.  Briefly,  Russell Buchanan a 44 y.o.malewith medical history significant ofbipolar disorder,schizophreniaand headache [facility]    # AMS, resolved # Possible seizures Presented unresponsive, not following commands, incontinent of urine and stools, however, demonstrating some purposeful movements. Vitals stable, labs unremarkable, CT head no acute finding.  Per chart patient had seizures as a child.IV Keppra 1.5 g givenin the ED.  Neuro consulted in the ED.  EEG no epileptiform activity was noted, per neuro.  Patient on Depakote and Neurontin prior to admission. Depakote level of 84.  All home PO medication were on on presentation due to pt being unresponsive and unable to take in oral safely.  The morning after admission, pt woke up and was back to baseline, alert, oriented and coherent.  Pt didn't remember any events from the past day.  Pt was resumed on Depakote at 500mg  in the morning and 1000mg  at night.  Neurontin was also resumed at 400 mg TID.  Keppra was NOT continued.  Wellbutrin is discontinued due to it's possible effect in lowering seizure threshold.  MRI of brain was done which was normal.  Pt has an outpatient neurologist and is advised to follow up  with outpatient neurology 1-2 weeks after discharge.  Will likely benefit from prolonged EEG monitoring after discharge.   Psych was also consulted and recommended no change in home psych regimen.  # Hx of bipolar disorder andschizophrenia, stable Pt is discharged back on home psych meds, Cogentin, Depakote, Lexapro, Seroquel, Haldol, Klonopin, Ativan. Also on Invega IM injection.  Wellbutrin is discontinued due to it's possible effect in lowering seizure threshold.  # Hx of headache Resumed home Depakote and Imitrex   # HLD # GERD with hiatal hernia # Chronic nausea # Chronic constipation Continued on home regimen.   Discharge Diagnoses:  Principal Problem:   AMS (altered mental status) Active Problems:   Schizophrenia ([place])   Bipolar disorder ([place])   Antisocial personality disorder [facility])    Discharge Instructions:  Allergies as of 06/27/2019      Reactions   Bee Venom Hives      Medication List    STOP taking these medications   buPROPion 150 MG 24 hr tablet Commonly known as: WELLBUTRIN XL   ondansetron 4 MG disintegrating tablet Commonly known as: Zofran ODT   promethazine 12.5 MG tablet Commonly known as: PHENERGAN     TAKE these medications   Ajovy 225 MG/1.5ML Sosy Generic drug: Fremanezumab-vfrm Inject into the skin every 28 (twenty-eight) days.   albuterol 108 (90 Base) MCG/ACT inhaler Commonly known as: VENTOLIN HFA Inhale 2 puffs into the lungs every 6 (six) hours as needed for wheezing or shortness of breath.   benztropine 1 MG tablet Commonly known as:  COGENTIN Take 1 mg by mouth 2 (two) times daily as needed for tremors.   Butalbital-APAP-Caffeine 50-325-40 MG capsule Take 1 capsule by mouth 2 (two) times daily as needed for headache.   cetirizine 10 MG tablet Commonly known as: ZYRTEC Take 10 mg by mouth daily.   clonazePAM 1 MG tablet Commonly known as: KLONOPIN Take 1 mg by mouth 2 (two) times daily as needed for anxiety.     clotrimazole-betamethasone cream Commonly known as: LOTRISONE Apply 1 application topically 3 (three) times daily.   Creon 36000 UNITS Cpep capsule Generic drug: lipase/protease/amylase Take 36,000 Units by mouth 3 (three) times daily with meals. 2 caps withmeals and 1 with snacks   Creon 36000 UNITS Cpep capsule Generic drug: lipase/protease/amylase Take 36,000 Units by mouth. With snacks   divalproex 500 MG DR tablet Commonly known as: DEPAKOTE Take 1 tablet (500 mg total) by mouth every morning. What changed: when to take this   divalproex 500 MG DR tablet Commonly known as: DEPAKOTE Take 2 tablets (1,000 mg total) by mouth at bedtime. What changed: You were already taking a medication with the same name, and this prescription was added. Make sure you understand how and when to take each.   docusate sodium 100 MG capsule Commonly known as: COLACE Take 100 mg by mouth daily.   EPINEPHrine 0.3 mg/0.3 mL Soaj injection Commonly known as: EPI-PEN Inject 0.3 mg into the muscle as needed (allergic reactions).   escitalopram 20 MG tablet Commonly known as: LEXAPRO Take 20 mg by mouth daily.   furosemide 40 MG tablet Commonly known as: Lasix Take 1 tablet (40 mg total) by mouth daily for 30 days.   gabapentin 400 MG capsule Commonly known as: Neurontin Take 1 capsule (400 mg total) by mouth 3 (three) times daily.   gemfibrozil 600 MG tablet Commonly known as: LOPID Take 600 mg by mouth 2 (two) times daily before a meal.   haloperidol 10 MG tablet Commonly known as: HALDOL Take 10 mg by mouth 2 (two) times daily as needed (agitation).   Invega Trinza 819 MG/2.625ML Susy Generic drug: Paliperidone Palmitate ER Inject into the muscle every 3 (three) months.   ketoconazole 2 % cream Commonly known as: NIZORAL Apply 1 application topically 2 (two) times daily.   Linzess 72 MCG capsule Generic drug: linaclotide Take 72 mcg by mouth daily.   LORazepam 1 MG  tablet Commonly known as: ATIVAN Take 1 mg by mouth every 8 (eight) hours as needed for anxiety.   magnesium gluconate 500 MG tablet Commonly known as: MAGONATE Take 500 mg by mouth daily.   meloxicam 15 MG tablet Commonly known as: MOBIC Take 1 tablet (15 mg total) by mouth daily as needed for pain. What changed:   when to take this  reasons to take this   metoprolol tartrate 50 MG tablet Commonly known as: LOPRESSOR Take 50 mg by mouth daily.   nystatin cream Commonly known as: MYCOSTATIN Apply 1 application topically 2 (two) times daily as needed for dry skin.   omeprazole 40 MG capsule Commonly known as: PRILOSEC Take 40 mg by mouth 2 (two) times daily before a meal.   ondansetron 8 MG tablet Commonly known as: ZOFRAN Take 8 mg by mouth every 8 (eight) hours as needed for nausea.   polyethylene glycol 17 g packet Commonly known as: MIRALAX / GLYCOLAX Take 17 g by mouth daily.   pravastatin 40 MG tablet Commonly known as: PRAVACHOL Take 40 mg by mouth  every evening.   propranolol 80 MG 24 hr capsule Commonly known as: INNOPRAN XL Don't need this if taking Lopressor. What changed:   how much to take  how to take this  when to take this  additional instructions   propranolol ER 80 MG 24 hr capsule Commonly known as: INDERAL LA Don't need this if taking Lopressor. What changed:   how much to take  how to take this  when to take this  additional instructions   QUEtiapine 50 MG tablet Commonly known as: SEROQUEL Take 75 mg by mouth at bedtime.   simethicone 80 MG chewable tablet Commonly known as: MYLICON Chew 80 mg by mouth 2 (two) times daily.   STIOLTO RESPIMAT IN Inhale 2 puffs into the lungs daily.   sucralfate 1 g tablet Commonly known as: CARAFATE Take 1 tablet (1 g total) by mouth 4 (four) times daily -  with meals and at bedtime.   SUMAtriptan 100 MG tablet Commonly known as: IMITREX Take 100 mg by mouth every 2 (two) hours as  needed for migraine. May repeat in 2 hours if headache persists or recurs.   tiotropium 18 MCG inhalation capsule Commonly known as: SPIRIVA Don't need this if taking Stiolto. What changed:   how much to take  how to take this  when to take this  additional instructions       Follow-up Information    Morene Crocker, MD Follow up.   Specialty: Neurology Why: follow up for possible seizures, and to receive prolonged EEG test. Contact information: [address] [place] [address] [phone]           Allergies  Allergen Reactions  . Bee Venom Hives     The results of significant diagnostics from this hospitalization (including imaging, microbiology, ancillary and laboratory) are listed below for reference.   Consultations:   Procedures/Studies: EEG  Result Date: 06/26/2019 Thana Farr, MD     06/26/2019  5:36 PM ELECTROENCEPHALOGRAM REPORT Patient: Russell Buchanan       Room #: 150A-AA EEG No. ID: [id number] Age: 44 y.o.        Sex: male Referring Physician: Fran Lowes Report Date:  06/26/2019       Interpreting Physician: Thana Farr History: Marquice Uddin is an 44 y.o. male with seizure like activity Medications: Depakote, Neurontin Conditions of Recording:  This is a 21 channel routine scalp EEG performed with bipolar and monopolar montages arranged in accordance to the international 10/20 system of electrode placement. One channel was dedicated to EKG recording. The patient is in the awake state. Description:  The waking background activity consists of a low voltage, symmetrical, fairly well organized, 7 Hz theta activity, seen from the parieto-occipital and posterior temporal regions.  Low voltage fast activity, poorly organized, is seen anteriorly and is at times superimposed on more posterior regions.  A mixture of theta and alpha rhythms are seen from the central and temporal regions with theta activity being most predominant. The patient does not drowse or  sleep. No epileptiform activity is noted.  Hyperventilation was not performed.  Intermittent photic stimulation was performed but failed to illicit any change in the tracing. IMPRESSION: This is an abnormal EEG secondary to posterior background slowing.  This finding may be seen with a diffuse gray matter disturbance that is etiologically nonspecific, but may include a dementia, among other possibilities.  No epileptiform activity is noted.  Thana Farr, MD Neurology [phone] 06/26/2019, 5:32 PM   EEG  Result Date: 06/25/2019 Thana Farr, MD     06/25/2019  6:06 PM ELECTROENCEPHALOGRAM REPORT Patient: Russell Buchanan       Room #: JJ88C EEG No. ID: [id number] Age: 44 y.o.        Sex: male Referring Physician: Fran Lowes Report Date:  06/25/2019       Interpreting Physician: Thana Farr History: Cuauhtemoc Huegel is an 44 y.o. male with altered mental status Medications: Depakote, Neurontin, Seroquel, Haldol, Klonopin, Cogentin, Wellbutrin Conditions of Recording:  This is a 21 channel routine scalp EEG performed with bipolar and monopolar montages arranged in accordance to the international 10/20 system of electrode placement. One channel was dedicated to EKG recording. The patient is in the altered state and uncooperative. Description:  Artifact is prominent during the recording often obscuring the background rhythm. When able to be visualized the background is slow and poorly organized.  Rarely a low voltage, polymorphic delta rhythm is seen that is diffusely distributed and continuous. No epileptiform activity is noted.  Hyperventilation was not performed. Intermittent photic stimulation was performed but failed to illicit any change in the tracing. IMPRESSION: This is a technically difficult record due to the predominance of muscle and movement artifact.  The underlying background rhythm appears slow.  No epileptiform activity is noted.  Thana Farr, MD Neurology [phone] 06/25/2019, 5:54 PM   CT  Head Wo Contrast  Result Date: 06/25/2019 CLINICAL DATA:  Altered mental status EXAM: CT HEAD WITHOUT CONTRAST TECHNIQUE: Contiguous axial images were obtained from the base of the skull through the vertex without intravenous contrast. COMPARISON:  06/02/2019 FINDINGS: Brain: No evidence of acute infarction, hemorrhage, hydrocephalus, extra-axial collection or mass lesion/mass effect. Vascular: No hyperdense vessel or unexpected calcification. Skull: Normal. Negative for fracture or focal lesion. Sinuses/Orbits: Mucous retention cyst versus sinonasal polyp within the left maxillary sinus, unchanged. Orbital structures intact. Other: None. IMPRESSION: No acute intracranial findings. Electronically Signed   By: Duanne Guess D.O.   On: 06/25/2019 13:07   MR BRAIN W WO CONTRAST  Addendum Date: 06/27/2019   ADDENDUM REPORT: 06/27/2019 00:39 ADDENDUM: Dedicated thin-section coronal oblique images were obtained. The hippocampi are normal and symmetric in size and signal. There is no cortical ectopia or dysplasia. Electronically Signed   By: Deatra Robinson M.D.   On: 06/27/2019 00:39   Result Date: 06/27/2019 CLINICAL DATA:  Seizure encephalopathy EXAM: MRI HEAD WITHOUT AND WITH CONTRAST TECHNIQUE: Multiplanar, multiecho pulse sequences of the brain and surrounding structures were obtained without and with intravenous contrast. CONTRAST:  25mL GADAVIST GADOBUTROL 1 MMOL/ML IV SOLN COMPARISON:  Brain MRI 06/04/2019 FINDINGS: Thirteen sequences are provided for interpretation. At the time of dictation, oblique coronal images had not been acquired. BRAIN: No acute infarct, acute hemorrhage or extra-axial collection. Normal white matter signal for age. Normal volume of brain parenchyma and CSF spaces. Midline structures are normal. There is no abnormal contrast enhancement. VASCULAR: Major flow voids are preserved. Susceptibility-sensitive sequences show no chronic microhemorrhage or superficial siderosis. SKULL AND  UPPER CERVICAL SPINE: Normal calvarium and skull base. Visualized upper cervical spine and soft tissues are normal. SINUSES/ORBITS: No fluid levels or advanced mucosal thickening. No mastoid or middle ear effusion. Normal orbits. IMPRESSION: Normal MRI of the brain. Electronically Signed: By: Deatra Robinson M.D. On: 06/26/2019 23:59      Labs: BNP (last 3 results) No results for input(s): BNP in the last 8760 hours. Basic Metabolic Panel: Recent Labs  Lab 06/25/19 1310 06/26/19 0515 06/27/19 0700  NA 136 140  139  K 3.8 3.4* 3.5  CL 99 102 102  CO2 27 27 26   GLUCOSE 120* 92 94  BUN 8 11 15   CREATININE 0.97 0.70 0.80  CALCIUM 8.8* 9.0 8.9  MG  --  2.0 1.9   Liver Function Tests: Recent Labs  Lab 06/25/19 1310  AST 19  ALT 10  ALKPHOS 98  BILITOT 0.9  PROT 7.0  ALBUMIN 3.8   No results for input(s): LIPASE, AMYLASE in the last 168 hours. No results for input(s): AMMONIA in the last 168 hours. CBC: Recent Labs  Lab 06/25/19 1310 06/26/19 0515 06/27/19 0700  WBC 5.3 5.0 6.3  NEUTROABS 2.6  --   --   HGB 15.0 14.7 14.0  HCT 43.8 41.4 40.5  MCV 84.9 84.7 86.0  PLT 180 174 161   Cardiac Enzymes: No results for input(s): CKTOTAL, CKMB, CKMBINDEX, TROPONINI in the last 168 hours. BNP: Invalid input(s): POCBNP CBG: No results for input(s): GLUCAP in the last 168 hours. D-Dimer No results for input(s): DDIMER in the last 72 hours. Hgb A1c No results for input(s): HGBA1C in the last 72 hours. Lipid Profile No results for input(s): CHOL, HDL, LDLCALC, TRIG, CHOLHDL, LDLDIRECT in the last 72 hours. Thyroid function studies No results for input(s): TSH, T4TOTAL, T3FREE, THYROIDAB in the last 72 hours.  Invalid input(s): FREET3 Anemia work up No results for input(s): VITAMINB12, FOLATE, FERRITIN, TIBC, IRON, RETICCTPCT in the last 72 hours. Urinalysis    Component Value Date/Time   COLORURINE YELLOW (A) 06/25/2019 1532   APPEARANCEUR HAZY (A) 06/25/2019 1532    APPEARANCEUR Clear 02/27/2014 2152   LABSPEC 1.012 06/25/2019 1532   LABSPEC 1.004 02/27/2014 2152   PHURINE 6.0 06/25/2019 1532   GLUCOSEU NEGATIVE 06/25/2019 1532   GLUCOSEU Negative 02/27/2014 2152   HGBUR NEGATIVE 06/25/2019 1532   BILIRUBINUR NEGATIVE 06/25/2019 1532   BILIRUBINUR Negative 02/27/2014 2152   KETONESUR 5 (A) 06/25/2019 1532   PROTEINUR 30 (A) 06/25/2019 1532   NITRITE NEGATIVE 06/25/2019 1532   LEUKOCYTESUR MODERATE (A) 06/25/2019 1532   LEUKOCYTESUR Negative 02/27/2014 2152   Sepsis Labs Invalid input(s): PROCALCITONIN,  WBC,  LACTICIDVEN Microbiology Recent Results (from the past 240 hour(s))  Respiratory Panel by RT PCR (Flu A&B, Covid) - Nasopharyngeal Swab     Status: None   Collection Time: 06/25/19  3:32 PM   Specimen: Nasopharyngeal Swab  Result Value Ref Range Status   SARS Coronavirus 2 by RT PCR NEGATIVE NEGATIVE Final    Comment: (NOTE) SARS-CoV-2 target nucleic acids are NOT DETECTED. The SARS-CoV-2 RNA is generally detectable in upper respiratoy specimens during the acute phase of infection. The lowest concentration of SARS-CoV-2 viral copies this assay can detect is 131 copies/mL. A negative result does not preclude SARS-Cov-2 infection and should not be used as the sole basis for treatment or other patient management decisions. A negative result may occur with  improper specimen collection/handling, submission of specimen other than nasopharyngeal swab, presence of viral mutation(s) within the areas targeted by this assay, and inadequate number of viral copies (<131 copies/mL). A negative result must be combined with clinical observations, patient history, and epidemiological information. The expected result is Negative. Fact Sheet for Patients:  2153 Fact Sheet for Healthcare Providers:  06/27/19 This test is not yet ap proved or cleared by the https://www.moore.com/ FDA and    has been authorized for detection and/or diagnosis of SARS-CoV-2 by FDA under an Emergency Use Authorization (EUA). This EUA will remain  in effect (meaning this test can be used) for the duration of the COVID-19 declaration under Section 564(b)(1) of the Act, 21 U.S.C. section 360bbb-3(b)(1), unless the authorization is terminated or revoked sooner.    Influenza A by PCR NEGATIVE NEGATIVE Final   Influenza B by PCR NEGATIVE NEGATIVE Final    Comment: (NOTE) The Xpert Xpress SARS-CoV-2/FLU/RSV assay is intended as an aid in  the diagnosis of influenza from Nasopharyngeal swab specimens and  should not be used as a sole basis for treatment. Nasal washings and  aspirates are unacceptable for Xpert Xpress SARS-CoV-2/FLU/RSV  testing. Fact Sheet for Patients: https://www.moore.com/ Fact Sheet for Healthcare Providers: https://www.young.biz/ This test is not yet approved or cleared by the [place] FDA and  has been authorized for detection and/or diagnosis of SARS-CoV-2 by  FDA under an Emergency Use Authorization (EUA). This EUA will remain  in effect (meaning this test can be used) for the duration of the  Covid-19 declaration under Section 564(b)(1) of the Act, 21  U.S.C. section 360bbb-3(b)(1), unless the authorization is  terminated or revoked. Performed at [facility], [address] [place], [place] [address]   MRSA PCR Screening     Status: None   Collection Time: 06/25/19  9:17 PM   Specimen: Nasopharyngeal  Result Value Ref Range Status   MRSA by PCR NEGATIVE NEGATIVE Final    Comment:        The GeneXpert MRSA Assay (FDA approved for NASAL specimens only), is one component of a comprehensive MRSA colonization surveillance program. It is not intended to diagnose MRSA infection nor to guide or monitor treatment for MRSA infections. Performed at [facility], [address] [place], [place] [address]       Total time spend on discharging this patient, including the last patient exam, discussing the hospital stay, instructions for ongoing care as it relates to all pertinent caregivers, as well as preparing the medical discharge records, prescriptions, and/or referrals as applicable, is 40 minutes.    Darlin Priestly, MD  [facility] 06/27/2019, 12:40 PM  If 7PM-7AM, please contact night-coverage

## 2019-06-27 NOTE — Progress Notes (Addendum)
Subjective: Patient awake and alert today, follows commands.  S/p MRI and EEG  Objective: Current vital signs: BP (!) 152/91 (BP Location: Left Arm)   Pulse 64   Temp 98.4 F (36.9 C) (Oral)   Resp 16   Ht 6\' 1"  (1.854 m)   Wt 104.3 kg   SpO2 99%   BMI 30.34 kg/m  Vital signs in last 24 hours: Temp:  [98.1 F (36.7 C)-98.4 F (36.9 C)] 98.4 F (36.9 C) (01/23 0003) Pulse Rate:  [64-92] 64 (01/23 0829) Resp:  [16-18] 16 (01/23 0003) BP: (110-152)/(82-91) 152/91 (01/23 0829) SpO2:  [97 %-99 %] 99 % (01/23 0829)  Intake/Output from previous day: No intake/output data recorded. Intake/Output this shift: No intake/output data recorded. Nutritional status:  Diet Order            Diet - low sodium heart healthy        Diet regular Room service appropriate? Yes; Fluid consistency: Thin  Diet effective now              Neurologic Exam: Mental Status: Alert, oriented, thought content appropriate.  Speech fluent without evidence of aphasia.  Able to follow 3 step commands without difficulty. Cranial Nerves: II:  Visual fields grossly normal, pupils equal, round, reactive to light and accommodation III,IV, VI: ptosis not present, extra-ocular motions intact bilaterally V,VII: smile symmetric, facial light touch sensation normal bilaterally VIII: hearing normal bilaterally IX,X: gag reflex present XI: bilateral shoulder shrug XII: midline tongue extension Motor: Right : Upper extremity   5/5    Left:     Upper extremity   5/5  Lower extremity   5/5     Lower extremity   5/5 Tone and bulk:normal tone throughout; no atrophy noted Sensory: Pinprick and light touch intact throughout, bilaterally Cerebellar: Normal finger-to-nose testing bilaterally   Lab Results: Basic Metabolic Panel: Recent Labs  Lab 06/25/19 1310 06/26/19 0515 06/27/19 0700  NA 136 140 139  K 3.8 3.4* 3.5  CL 99 102 102  CO2 27 27 26   GLUCOSE 120* 92 94  BUN 8 11 15   CREATININE 0.97 0.70  0.80  CALCIUM 8.8* 9.0 8.9  MG  --  2.0 1.9    Liver Function Tests: Recent Labs  Lab 06/25/19 1310  AST 19  ALT 10  ALKPHOS 98  BILITOT 0.9  PROT 7.0  ALBUMIN 3.8   No results for input(s): LIPASE, AMYLASE in the last 168 hours. No results for input(s): AMMONIA in the last 168 hours.  CBC: Recent Labs  Lab 06/25/19 1310 06/26/19 0515 06/27/19 0700  WBC 5.3 5.0 6.3  NEUTROABS 2.6  --   --   HGB 15.0 14.7 14.0  HCT 43.8 41.4 40.5  MCV 84.9 84.7 86.0  PLT 180 174 161    Cardiac Enzymes: No results for input(s): CKTOTAL, CKMB, CKMBINDEX, TROPONINI in the last 168 hours.  Lipid Panel: No results for input(s): CHOL, TRIG, HDL, CHOLHDL, VLDL, LDLCALC in the last 168 hours.  CBG: No results for input(s): GLUCAP in the last 168 hours.  Microbiology: Results for orders placed or performed during the hospital encounter of 06/25/19  Respiratory Panel by RT PCR (Flu A&B, Covid) - Nasopharyngeal Swab     Status: None   Collection Time: 06/25/19  3:32 PM   Specimen: Nasopharyngeal Swab  Result Value Ref Range Status   SARS Coronavirus 2 by RT PCR NEGATIVE NEGATIVE Final    Comment: (NOTE) SARS-CoV-2 target nucleic acids are NOT DETECTED.  The SARS-CoV-2 RNA is generally detectable in upper respiratoy specimens during the acute phase of infection. The lowest concentration of SARS-CoV-2 viral copies this assay can detect is 131 copies/mL. A negative result does not preclude SARS-Cov-2 infection and should not be used as the sole basis for treatment or other patient management decisions. A negative result may occur with  improper specimen collection/handling, submission of specimen other than nasopharyngeal swab, presence of viral mutation(s) within the areas targeted by this assay, and inadequate number of viral copies (<131 copies/mL). A negative result must be combined with clinical observations, patient history, and epidemiological information. The expected result is  Negative. Fact Sheet for Patients:  https://www.moore.com/ Fact Sheet for Healthcare Providers:  https://www.young.biz/ This test is not yet ap proved or cleared by the [place] FDA and  has been authorized for detection and/or diagnosis of SARS-CoV-2 by FDA under an Emergency Use Authorization (EUA). This EUA will remain  in effect (meaning this test can be used) for the duration of the COVID-19 declaration under Section 564(b)(1) of the Act, 21 U.S.C. section 360bbb-3(b)(1), unless the authorization is terminated or revoked sooner.    Influenza A by PCR NEGATIVE NEGATIVE Final   Influenza B by PCR NEGATIVE NEGATIVE Final    Comment: (NOTE) The Xpert Xpress SARS-CoV-2/FLU/RSV assay is intended as an aid in  the diagnosis of influenza from Nasopharyngeal swab specimens and  should not be used as a sole basis for treatment. Nasal washings and  aspirates are unacceptable for Xpert Xpress SARS-CoV-2/FLU/RSV  testing. Fact Sheet for Patients: https://www.moore.com/ Fact Sheet for Healthcare Providers: https://www.young.biz/ This test is not yet approved or cleared by the [place] FDA and  has been authorized for detection and/or diagnosis of SARS-CoV-2 by  FDA under an Emergency Use Authorization (EUA). This EUA will remain  in effect (meaning this test can be used) for the duration of the  Covid-19 declaration under Section 564(b)(1) of the Act, 21  U.S.C. section 360bbb-3(b)(1), unless the authorization is  terminated or revoked. Performed at [facility], [address] [place], [place] [address]   MRSA PCR Screening     Status: None   Collection Time: 06/25/19  9:17 PM   Specimen: Nasopharyngeal  Result Value Ref Range Status   MRSA by PCR NEGATIVE NEGATIVE Final    Comment:        The GeneXpert MRSA Assay (FDA approved for NASAL specimens only), is one component of  a comprehensive MRSA colonization surveillance program. It is not intended to diagnose MRSA infection nor to guide or monitor treatment for MRSA infections. Performed at [facility], [address] [place], [place] [address]     Coagulation Studies: No results for input(s): LABPROT, INR in the last 72 hours.  Imaging: EEG  Result Date: 06/26/2019 Thana Farr, MD     06/26/2019  5:36 PM ELECTROENCEPHALOGRAM REPORT Patient: Russell Buchanan       Room #: 150A-AA EEG No. ID: [id number] Age: 44 y.o.        Sex: male Referring Physician: Fran Lowes Report Date:  06/26/2019       Interpreting Physician: Thana Farr History: Russell Buchanan is an 44 y.o. male with seizure like activity Medications: Depakote, Neurontin Conditions of Recording:  This is a 21 channel routine scalp EEG performed with bipolar and monopolar montages arranged in accordance to the international 10/20 system of electrode placement. One channel was dedicated to EKG recording. The patient is in the awake state. Description:  The  waking background activity consists of a low voltage, symmetrical, fairly well organized, 7 Hz theta activity, seen from the parieto-occipital and posterior temporal regions.  Low voltage fast activity, poorly organized, is seen anteriorly and is at times superimposed on more posterior regions.  A mixture of theta and alpha rhythms are seen from the central and temporal regions with theta activity being most predominant. The patient does not drowse or sleep. No epileptiform activity is noted.  Hyperventilation was not performed.  Intermittent photic stimulation was performed but failed to illicit any change in the tracing. IMPRESSION: This is an abnormal EEG secondary to posterior background slowing.  This finding may be seen with a diffuse gray matter disturbance that is etiologically nonspecific, but may include a dementia, among other possibilities.  No epileptiform activity is noted.  Alexis Goodell, MD Neurology [phone] 06/26/2019, 5:32 PM   EEG  Result Date: 06/25/2019 Alexis Goodell, MD     06/25/2019  6:06 PM ELECTROENCEPHALOGRAM REPORT Patient: Russell Buchanan       Room #: EN27P EEG No. ID: [id number] Age: 44 y.o.        Sex: male Referring Physician: Billie Ruddy Report Date:  06/25/2019       Interpreting Physician: Alexis Goodell History: Russell Buchanan is an 44 y.o. male with altered mental status Medications: Depakote, Neurontin, Seroquel, Haldol, Klonopin, Cogentin, Wellbutrin Conditions of Recording:  This is a 21 channel routine scalp EEG performed with bipolar and monopolar montages arranged in accordance to the international 10/20 system of electrode placement. One channel was dedicated to EKG recording. The patient is in the altered state and uncooperative. Description:  Artifact is prominent during the recording often obscuring the background rhythm. When able to be visualized the background is slow and poorly organized.  Rarely a low voltage, polymorphic delta rhythm is seen that is diffusely distributed and continuous. No epileptiform activity is noted.  Hyperventilation was not performed. Intermittent photic stimulation was performed but failed to illicit any change in the tracing. IMPRESSION: This is a technically difficult record due to the predominance of muscle and movement artifact.  The underlying background rhythm appears slow.  No epileptiform activity is noted.  Alexis Goodell, MD Neurology [phone] 06/25/2019, 5:54 PM   CT Head Wo Contrast  Result Date: 06/25/2019 CLINICAL DATA:  Altered mental status EXAM: CT HEAD WITHOUT CONTRAST TECHNIQUE: Contiguous axial images were obtained from the base of the skull through the vertex without intravenous contrast. COMPARISON:  06/02/2019 FINDINGS: Brain: No evidence of acute infarction, hemorrhage, hydrocephalus, extra-axial collection or mass lesion/mass effect. Vascular: No hyperdense vessel or unexpected calcification.  Skull: Normal. Negative for fracture or focal lesion. Sinuses/Orbits: Mucous retention cyst versus sinonasal polyp within the left maxillary sinus, unchanged. Orbital structures intact. Other: None. IMPRESSION: No acute intracranial findings. Electronically Signed   By: Davina Poke D.O.   On: 06/25/2019 13:07   MR BRAIN W WO CONTRAST  Addendum Date: 06/27/2019   ADDENDUM REPORT: 06/27/2019 00:39 ADDENDUM: Dedicated thin-section coronal oblique images were obtained. The hippocampi are normal and symmetric in size and signal. There is no cortical ectopia or dysplasia. Electronically Signed   By: Ulyses Jarred M.D.   On: 06/27/2019 00:39   Result Date: 06/27/2019 CLINICAL DATA:  Seizure encephalopathy EXAM: MRI HEAD WITHOUT AND WITH CONTRAST TECHNIQUE: Multiplanar, multiecho pulse sequences of the brain and surrounding structures were obtained without and with intravenous contrast. CONTRAST:  69mL GADAVIST GADOBUTROL 1 MMOL/ML IV SOLN COMPARISON:  Brain MRI 06/04/2019 FINDINGS: Thirteen sequences are  provided for interpretation. At the time of dictation, oblique coronal images had not been acquired. BRAIN: No acute infarct, acute hemorrhage or extra-axial collection. Normal white matter signal for age. Normal volume of brain parenchyma and CSF spaces. Midline structures are normal. There is no abnormal contrast enhancement. VASCULAR: Major flow voids are preserved. Susceptibility-sensitive sequences show no chronic microhemorrhage or superficial siderosis. SKULL AND UPPER CERVICAL SPINE: Normal calvarium and skull base. Visualized upper cervical spine and soft tissues are normal. SINUSES/ORBITS: No fluid levels or advanced mucosal thickening. No mastoid or middle ear effusion. Normal orbits. IMPRESSION: Normal MRI of the brain. Electronically Signed: By: Deatra Robinson M.D. On: 06/26/2019 23:59    Medications:  I have reviewed the patient's current medications. Scheduled: . divalproex  1,000 mg Oral QHS   . divalproex  500 mg Oral Daily  . enoxaparin (LOVENOX) injection  40 mg Subcutaneous Q24H  . escitalopram  20 mg Oral Daily  . gabapentin  400 mg Oral TID  . linaclotide  72 mcg Oral Daily  . lipase/protease/amylase  36,000 Units Oral TID WC  . metoprolol tartrate  50 mg Oral Daily  . pantoprazole  40 mg Oral Daily  . pravastatin  40 mg Oral QPM  . QUEtiapine  75 mg Oral QHS  . tiotropium  18 mcg Inhalation Daily    Assessment/Plan: 44 y.o. male with a history of BPD and schizophrenia presenting with altered mental status with question of possible seizure activity.  Patient has multiple features on examination and since presentation that suggests that this is a functional presentation but can not rule out a prolonged post-ictal state.  Patient on Depakote and Neurontin prior to admission.  Depakote level of 84.  Had seizures as a child.  Head CT reviewed and shows no acute changes.   Today patient at baseline.  EEG reviewed and with significant artifact but did not appear to show epileptiform activity with some question of slowing.  Patient loaded with Keppra. EEG with generalized slowing   Recommendations: - VPA 500mg  AM and 1g nightly - MRI brain no acute abnormality  - stop Keppra as doing well - Out of bed assistance with physical therapy - d/c planning from neurological standpoint

## 2019-07-07 ENCOUNTER — Emergency Department: Payer: Medicaid Other

## 2019-07-07 ENCOUNTER — Emergency Department
Admission: EM | Admit: 2019-07-07 | Discharge: 2019-07-08 | Disposition: A | Discharge status: Home / Self Care | Payer: Medicaid Other | Attending: Student in an Organized Health Care Education/Training Program | Admitting: Student in an Organized Health Care Education/Training Program

## 2019-07-07 ENCOUNTER — Encounter: Payer: Self-pay | Admitting: Intensive Care

## 2019-07-07 ENCOUNTER — Other Ambulatory Visit: Payer: Self-pay

## 2019-07-07 DIAGNOSIS — G40909 Epilepsy, unspecified, not intractable, without status epilepticus: Secondary | ICD-10-CM | POA: Diagnosis not present

## 2019-07-07 DIAGNOSIS — E785 Hyperlipidemia, unspecified: Secondary | ICD-10-CM | POA: Diagnosis not present

## 2019-07-07 DIAGNOSIS — F17210 Nicotine dependence, cigarettes, uncomplicated: Secondary | ICD-10-CM | POA: Insufficient documentation

## 2019-07-07 DIAGNOSIS — R4182 Altered mental status, unspecified: Secondary | ICD-10-CM

## 2019-07-07 DIAGNOSIS — Z79899 Other long term (current) drug therapy: Secondary | ICD-10-CM | POA: Insufficient documentation

## 2019-07-07 DIAGNOSIS — Z91030 Bee allergy status: Secondary | ICD-10-CM | POA: Diagnosis not present

## 2019-07-07 DIAGNOSIS — F209 Schizophrenia, unspecified: Secondary | ICD-10-CM | POA: Insufficient documentation

## 2019-07-07 HISTORY — DX: Unspecified convulsions: R56.9

## 2019-07-07 LAB — CBC
HCT: 46.7 % (ref 39.0–52.0)
Hemoglobin: 15.9 g/dL (ref 13.0–17.0)
MCH: 29.4 pg (ref 26.0–34.0)
MCHC: 34 g/dL (ref 30.0–36.0)
MCV: 86.3 fL (ref 80.0–100.0)
Platelets: 229 10*3/uL (ref 150–400)
RBC: 5.41 MIL/uL (ref 4.22–5.81)
RDW: 13.7 % (ref 11.5–15.5)
WBC: 6.7 10*3/uL (ref 4.0–10.5)
nRBC: 0 % (ref 0.0–0.2)

## 2019-07-07 LAB — URINE DRUG SCREEN, QUALITATIVE (ARMC ONLY)
Amphetamines, Ur Screen: NOT DETECTED
Barbiturates, Ur Screen: NOT DETECTED
Benzodiazepine, Ur Scrn: POSITIVE — AB
Cannabinoid 50 Ng, Ur ~~LOC~~: NOT DETECTED
Cocaine Metabolite,Ur ~~LOC~~: NOT DETECTED
MDMA (Ecstasy)Ur Screen: NOT DETECTED
Methadone Scn, Ur: NOT DETECTED
Opiate, Ur Screen: NOT DETECTED
Phencyclidine (PCP) Ur S: NOT DETECTED
Tricyclic, Ur Screen: NOT DETECTED

## 2019-07-07 LAB — COMPREHENSIVE METABOLIC PANEL
ALT: 25 U/L (ref 0–44)
AST: 25 U/L (ref 15–41)
Albumin: 4.5 g/dL (ref 3.5–5.0)
Alkaline Phosphatase: 86 U/L (ref 38–126)
Anion gap: 13 (ref 5–15)
BUN: 10 mg/dL (ref 6–20)
CO2: 26 mmol/L (ref 22–32)
Calcium: 9.4 mg/dL (ref 8.9–10.3)
Chloride: 99 mmol/L (ref 98–111)
Creatinine, Ser: 0.84 mg/dL (ref 0.61–1.24)
GFR calc Af Amer: >60 mL/min (ref >60)
GFR calc non Af Amer: >60 mL/min (ref >60)
Glucose, Bld: 124 mg/dL — ABNORMAL HIGH (ref 70–99)
Potassium: 3.5 mmol/L (ref 3.5–5.1)
Sodium: 138 mmol/L (ref 135–145)
Total Bilirubin: 0.7 mg/dL (ref 0.3–1.2)
Total Protein: 7.9 g/dL (ref 6.5–8.1)

## 2019-07-07 LAB — VALPROIC ACID LEVEL: Valproic Acid Lvl: 72 ug/mL (ref 50.0–100.0)

## 2019-07-07 LAB — GLUCOSE, CAPILLARY: Glucose-Capillary: 129 mg/dL — ABNORMAL HIGH (ref 70–99)

## 2019-07-07 MED ORDER — LORAZEPAM 2 MG/ML IJ SOLN
1.0000 mg | Freq: Once | INTRAMUSCULAR | Status: AC
  Administered 2019-07-07: 1 mg via INTRAVENOUS
  Filled 2019-07-07: qty 1

## 2019-07-07 MED ORDER — LEVETIRACETAM IN NACL 1000 MG/100ML IV SOLN
1000.0000 mg | Freq: Once | INTRAVENOUS | Status: AC
  Administered 2019-07-07: 1000 mg via INTRAVENOUS
  Filled 2019-07-07: qty 100

## 2019-07-07 NOTE — ED Notes (Signed)
This RN attempted to wake pt up via sternal rub and pinch. Pt only response to painful stimuli was to clench jaw. After this RN would release painful stimuli, pt would unclench jaw. While this RN inserted IV, pt flinched and moaned, but no verbal response. Pt still non-verbal after ativan administration. Oxygen currently 99% on room air. This RN will continue to monitor.

## 2019-07-07 NOTE — ED Triage Notes (Signed)
Patient arrived from [facility]. Staff reports patient has been lethargic/AMS. Staff reports in the last hour it has been worse. HX similar presentation

## 2019-07-07 NOTE — ED Notes (Signed)
Pt found to be incontinent of urine.  Linens changed, clothing placed in belongings bag.  Patient opened eyes briefly during this time but went back to sleep.  Condom catheter applied.

## 2019-07-07 NOTE — ED Provider Notes (Addendum)
[facility] Emergency Department Provider Note    First MD Initiated Contact with Patient 07/07/19 1721     (approximate)  I have reviewed the triage vital signs and the nursing notes.   HISTORY  Chief Complaint Altered Mental Status  Level V Caveat:  AMS  HPI Russell Buchanan is a 44 y.o. male the below listed past medical history with recent admission to the hospital for altered mental status.  Presents the ER for abnormal behavior becoming increasingly altered.  No witnessed seizure-like activity.  Reportedly was follow-up for cardiology clinic today and found to have low O2 sats however is satting well on room air currently.  I saw patient from previous admission and his presentation today is almost identical.  Does respond to pain but is uncooperative with exam.  No lateralizing deficits grossly.  Does have some motor strength against gravity.    Past Medical History:  Diagnosis Date  . Antisocial personality disorder ([place])   . Bipolar disorder ([place])   . Environmental and seasonal allergies   . GERD (gastroesophageal reflux disease)   . Headache   . Hyperlipidemia   . Infected elbow ([place])   . Schizophrenia ([place])   . Seizures ([place])   . Tremors of nervous system    Family History  Family history unknown: Yes   Past Surgical History:  Procedure Laterality Date  . CLOSED REDUCTION WRIST FRACTURE Left   . HARDWARE REMOVAL Right 04/14/2018   Procedure: HARDWARE REMOVAL-RIGHT ELBOW;  Surgeon: Earnestine Leys, MD;  Location: [facility] ORS;  Service: Orthopedics;  Laterality: Right;  . ORIF ELBOW FRACTURE Right   . SCALP LACERATION REPAIR    . WRIST RECONSTRUCTION Left    Patient Active Problem List   Diagnosis Date Noted  . AMS (altered mental status) 06/25/2019  . Bipolar disorder ([place])   . 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 Acute encephalopathy 12/04/2016  . GERD (gastroesophageal reflux disease) 12/04/2016  . HLD (hyperlipidemia)  12/04/2016  . Schizophrenia ([place]) 11/15/2016  . Medication side effects 11/15/2016      Prior to Admission medications   Medication Sig Start Date End Date Taking? Authorizing Provider  albuterol (PROVENTIL HFA;VENTOLIN HFA) 108 (90 Base) MCG/ACT inhaler Inhale 2 puffs into the lungs every 6 (six) hours as needed for wheezing or shortness of breath. 01/30/17   Darel Hong, MD  benztropine (COGENTIN) 1 MG tablet Take 1 mg by mouth 2 (two) times daily as needed for tremors.     [provider]  Butalbital-APAP-Caffeine 50-325-40 MG per capsule Take 1 capsule by mouth 2 (two) times daily as needed for headache.     [provider]  cetirizine (ZYRTEC) 10 MG tablet Take 10 mg by mouth daily.    [provider]  clonazePAM (KLONOPIN) 1 MG tablet Take 1 mg by mouth 2 (two) times daily as needed for anxiety.    [provider]  clotrimazole-betamethasone (LOTRISONE) cream Apply 1 application topically 3 (three) times daily.    [provider]  divalproex (DEPAKOTE) 500 MG DR tablet Take 1 tablet (500 mg total) by mouth every morning. 06/27/19 06/26/20  Enzo Bi, MD  divalproex (DEPAKOTE) 500 MG DR tablet Take 2 tablets (1,000 mg total) by mouth at bedtime. 06/27/19   Enzo Bi, MD  docusate sodium (COLACE) 100 MG capsule Take 100 mg by mouth daily.    [provider]  EPINEPHrine 0.3 mg/0.3 mL IJ SOAJ injection Inject 0.3 mg into the muscle as needed (  allergic reactions).     [provider]  escitalopram (LEXAPRO) 20 MG tablet Take 20 mg by mouth daily.    [provider]  Fremanezumab-vfrm (AJOVY) 225 MG/1.5ML SOSY Inject into the skin every 28 (twenty-eight) days.    [provider]  furosemide (LASIX) 40 MG tablet Take 1 tablet (40 mg total) by mouth daily for 30 days. 06/25/18 06/25/19  Minna Antis, MD  gabapentin (NEURONTIN) 400 MG capsule Take 1 capsule (400 mg total) by mouth 3 (three) times daily. 04/14/18    Deeann Saint, MD  gemfibrozil (LOPID) 600 MG tablet Take 600 mg by mouth 2 (two) times daily before a meal.    [provider]  haloperidol (HALDOL) 10 MG tablet Take 10 mg by mouth 2 (two) times daily as needed (agitation).     [provider]  ketoconazole (NIZORAL) 2 % cream Apply 1 application topically 2 (two) times daily.    [provider]  linaclotide (LINZESS) 72 MCG capsule Take 72 mcg by mouth daily.     [provider]  lipase/protease/amylase (CREON) 36000 UNITS CPEP capsule Take 36,000 Units by mouth 3 (three) times daily with meals. 2 caps withmeals and 1 with snacks    [provider]  lipase/protease/amylase (CREON) 36000 UNITS CPEP capsule Take 36,000 Units by mouth. With snacks    [provider]  LORazepam (ATIVAN) 1 MG tablet Take 1 mg by mouth every 8 (eight) hours as needed for anxiety.    [provider]  magnesium gluconate (MAGONATE) 500 MG tablet Take 500 mg by mouth daily.    [provider]  meloxicam (MOBIC) 15 MG tablet Take 1 tablet (15 mg total) by mouth daily as needed for pain. 06/27/19   Darlin Priestly, MD  metoprolol tartrate (LOPRESSOR) 50 MG tablet Take 50 mg by mouth daily.    [provider]  nystatin cream (MYCOSTATIN) Apply 1 application topically 2 (two) times daily as needed for dry skin.     [provider]  omeprazole (PRILOSEC) 40 MG capsule Take 40 mg by mouth 2 (two) times daily before a meal. 04/16/19   [provider]  ondansetron (ZOFRAN) 8 MG tablet Take 8 mg by mouth every 8 (eight) hours as needed for nausea. 06/16/19   [provider]  Paliperidone Palmitate ER (INVEGA TRINZA) 819 MG/2.625ML SUSY Inject into the muscle every 3 (three) months.    [provider]  polyethylene glycol (MIRALAX / GLYCOLAX) packet Take 17 g by mouth daily.    [provider]  pravastatin (PRAVACHOL) 40 MG tablet Take 40 mg by mouth every evening.  06/24/19   [provider]  propranolol (INNOPRAN XL) 80 MG 24 hr capsule Don't need this if taking Lopressor. 06/27/19   Darlin Priestly, MD  propranolol ER (INDERAL LA) 80 MG 24 hr capsule Don't need this if taking Lopressor. 06/27/19   Darlin Priestly, MD  QUEtiapine (SEROQUEL) 50 MG tablet Take 75 mg by mouth at bedtime. 06/24/19   [provider]  simethicone (MYLICON) 80 MG chewable tablet Chew 80 mg by mouth 2 (two) times daily.    [provider]  sucralfate (CARAFATE) 1 g tablet Take 1 tablet (1 g total) by mouth 4 (four) times daily -  with meals and at bedtime. 05/13/18   Willy Eddy, MD  SUMAtriptan (IMITREX) 100 MG tablet Take 100 mg by mouth every 2 (two) hours as needed for migraine. May repeat in 2 hours if headache persists  or recurs.    [provider]  tiotropium (SPIRIVA) 18 MCG inhalation capsule Don't need this if taking Stiolto. 06/27/19   Darlin Priestly, MD  Tiotropium Bromide-Olodaterol (STIOLTO RESPIMAT IN) Inhale 2 puffs into the lungs daily.    [provider]    Allergies Bee venom    Social History Social History   Tobacco Use  . Smoking status: Current Every Day Smoker    Packs/day: 0.50    Years: 7.00    Pack years: 3.50    Types: Cigarettes  . Smokeless tobacco: Former Neurosurgeon    Types: Chew, Snuff  Substance Use Topics  . Alcohol use: No  . Drug use: Not Currently    Review of Systems Patient denies headaches, rhinorrhea, blurry vision, numbness, shortness of breath, chest pain, edema, cough, abdominal pain, nausea, vomiting, diarrhea, dysuria, fevers, rashes or hallucinations unless otherwise stated above in HPI. ____________________________________________   PHYSICAL EXAM:  VITAL SIGNS: Vitals:   07/07/19 2139 07/07/19 2223  BP: 121/67 132/82  Pulse: (!) 52 (!) 52  Resp: 18 16  Temp:    SpO2: 98% 95%    Constitutional: resting in ED bed in No distress. Protecting airway Eyes: Conjunctivae are normal.   Pupils equal and reactive Head: Atraumatic. Nose: No congestion/rhinnorhea. Mouth/Throat: Mucous membranes are moist.   Neck: No stridor. Painless ROM.  Cardiovascular: Normal rate, regular rhythm. Grossly normal heart sounds.  Good peripheral circulation. Respiratory: Normal respiratory effort.  No retractions. Lungs CTAB. Gastrointestinal: Soft and nontender. No distention. No abdominal bruits. No CVA tenderness. Genitourinary:  Musculoskeletal: No lower extremity tenderness nor edema.  No joint effusions. Neurologic:  uncooperative with exam,  Withdraws to pain,  Hold BUE with effort against gravity briefly Skin:  Skin is warm, dry and intact. No rash noted. Psychiatric:unable to assess ____________________________________________   LABS (all labs ordered are listed, but only abnormal results are displayed)  Results for orders placed or performed during the hospital encounter of 07/07/19 (from the past 24 hour(s))  Glucose, capillary     Status: Abnormal   Collection Time: 07/07/19  2:55 PM  Result Value Ref Range   Glucose-Capillary 129 (H) 70 - 99 mg/dL  Comprehensive metabolic panel     Status: Abnormal   Collection Time: 07/07/19  3:04 PM  Result Value Ref Range   Sodium 138 135 - 145 mmol/L   Potassium 3.5 3.5 - 5.1 mmol/L   Chloride 99 98 - 111 mmol/L   CO2 26 22 - 32 mmol/L   Glucose, Bld 124 (H) 70 - 99 mg/dL   BUN 10 6 - 20 mg/dL   Creatinine, Ser 9.39 0.61 - 1.24 mg/dL   Calcium 9.4 8.9 - 03.0 mg/dL   Total Protein 7.9 6.5 - 8.1 g/dL   Albumin 4.5 3.5 - 5.0 g/dL   AST 25 15 - 41 U/L   ALT 25 0 - 44 U/L   Alkaline Phosphatase 86 38 - 126 U/L   Total Bilirubin 0.7 0.3 - 1.2 mg/dL   GFR calc non Af Amer >60 >60 mL/min   GFR calc Af Amer >60 >60 mL/min   Anion gap 13 5 - 15  CBC     Status: None   Collection Time: 07/07/19  3:04 PM  Result Value Ref Range   WBC 6.7 4.0 - 10.5 K/uL   RBC 5.41 4.22 - 5.81 MIL/uL   Hemoglobin 15.9 13.0 - 17.0 g/dL   HCT 09.2  33.0 - 07.6 %   MCV  86.3 80.0 - 100.0 fL   MCH 29.4 26.0 - 34.0 pg   MCHC 34.0 30.0 - 36.0 g/dL   RDW 85.4 62.7 - 03.5 %   Platelets 229 150 - 400 K/uL   nRBC 0.0 0.0 - 0.2 %  Valproic acid level     Status: None   Collection Time: 07/07/19  6:56 PM  Result Value Ref Range   Valproic Acid Lvl 72 50.0 - 100.0 ug/mL   ____________________________________________  EKG My review and personal interpretation at Time: 18:11   Indication: ams  Rate: 60  Rhythm: sinus Axis: normal Other: iRBBB, no stemi ____________________________________________  RADIOLOGY  I personally reviewed all radiographic images ordered to evaluate for the above acute complaints and reviewed radiology reports and findings.  These findings were personally discussed with the patient.  Please see medical record for radiology report.  ____________________________________________   PROCEDURES  Procedure(s) performed:  Procedures    Critical Care performed: no ____________________________________________   INITIAL IMPRESSION / ASSESSMENT AND PLAN / ED COURSE  Pertinent labs & imaging results that were available during my care of the patient were reviewed by me and considered in my medical decision making (see chart for details).   DDX: Dehydration, cva, seizure, status, hypoglycemia,  drug effect, withdrawal, catatonia,    Russell Buchanan is a 44 y.o. who presents to the ED with symptoms as described above.  Very similar presentation for which was just recently seen in the ER admitted to the hospital for neuro work-up which was reassuring and felt to more likely functional pathology that seizure.  T  neuroimaging repeated with no acute abnormality. his does not appear consistent with seizure or status.  Patient given a dose of Ativan to see if he was having severe anxiety or psychosis to see if he had any sort of change in behavior.  No significant change.  As he is was to be on Keppra will give his evening  dose and continue to observe.  Though patient is uncooperative with full exam, remainder of exam is reassuring.  He is hemodynamically stable.  Clinical Course as of Jul 07 2235  Tue Jul 07, 2019  0093 Patient reassessed.  Still uncooperative with exam.  Does respond to pain.  No seizure like activity.  Appears more catatonic. Responded to IV stick.  After recent admission for neurology work-up with reassuring findings I have a high suspicion that this is psychiatric related.  Will consult psych for further recommendations.   [PR]    Clinical Course User Index [PR] Willy Eddy, MD    The patient was evaluated in Emergency Department today for the symptoms described in the history of present illness. He/she was evaluated in the context of the global COVID-19 pandemic, which necessitated consideration that the patient might be at risk for infection with the SARS-CoV-2 virus that causes COVID-19. Institutional protocols and algorithms that pertain to the evaluation of patients at risk for COVID-19 are in a state of rapid change based on information released by regulatory bodies including the CDC and federal and state organizations. These policies and algorithms were followed during the patient's care in the ED.  As part of my medical decision making, I reviewed the following data within the electronic MEDICAL RECORD NUMBER Nursing notes reviewed and incorporated, Labs reviewed, notes from prior ED visits and [place] Controlled Substance Database   ____________________________________________   FINAL CLINICAL IMPRESSION(S) / ED DIAGNOSES  Final diagnoses:  Altered mental status, unspecified altered mental status type  NEW MEDICATIONS STARTED DURING THIS VISIT:  New Prescriptions   No medications on file     Note:  This document was prepared using Dragon voice recognition software and may include unintentional dictation errors.    Willy Eddy, MD 07/07/19 Regan Lemming    Willy Eddy, MD 07/07/19 2237

## 2019-07-07 NOTE — ED Notes (Signed)
Pt nurse number, Rowan Blase, [phone]  Pt transportation driver, Loraine Leriche, number is [phone]  [facility] number is [phone]

## 2019-07-08 NOTE — ED Notes (Signed)
This spoke with caregiver, Marlene Bast to confirm address facility as [address], [place].

## 2019-07-08 NOTE — ED Notes (Signed)
[facility] [employer] arrived. This Rn taken to cab via wheelchair. Pt A/Ox4. NAD noted upon DC.

## 2019-07-08 NOTE — Progress Notes (Signed)
Russell Buchanan is a 44 y.o. male the below listed past medical history with recent admission to the hospital for altered mental status.  Presents the ER for abnormal behavior becoming increasingly altered. This provider was unable to assess the patient due to him not responding. Attempted to wake the patient up via sternal rub, it was unsuccessful.

## 2019-07-08 NOTE — ED Notes (Signed)
Patient woke up briefly and was able to give his name and birth date and stated he was in the hospital, then he closed his eyes and did not answer any more questions.

## 2019-07-08 NOTE — ED Notes (Signed)
Pt walked to the lobby w/o notifying this Rn.This Rn redirected pt to 1H safely , pt walking with a steady gait and  advised pt to wait until [employer] called this Rn to let her know they have arrived. Pt st "I just wanted some fresh air" this RN reassured pt with [facility] ETA of 1 hr. Pt verbalizes understanding of this RN instructions.

## 2019-07-08 NOTE — ED Provider Notes (Signed)
-----------------------------------------   1:07 PM on 07/08/2019 -----------------------------------------  Patient evaluated by psychiatry this morning and he seems to have returned to his baseline.  He has a history of schizophrenia with prior episodes of encephalopathy.  It is possible that the Imitrex he takes for migraines is interacting with his psychiatric medications and psychiatry recommends discontinuing this.  They state that he will be appropriate for discharge home and follow-up with his psychiatrist as well as neurology as an outpatient.  Patient is awake and alert and appropriately interactive at this time, appropriate for discharge home.   Chesley Noon, MD 07/08/19 1308

## 2019-07-08 NOTE — Discharge Instructions (Addendum)
Please stop taking your migraine medicine (Imitrex) until you are able to follow-up with your neurologist.

## 2019-07-08 NOTE — ED Notes (Signed)
This RN spoke with RN Rowan Blase [phone] who st 'call [employer] we have an arrangement for transportation". This Rn will call [employer] to confirm transportation.

## 2019-07-08 NOTE — ED Notes (Signed)
This RN changed pt into blue scrub pants; pt taken to toilet via wheelchair per pt's request. Pt A/Ox4. NAD noted by this RN. Pt calm and cooperative at this time.  Pt given grape juice per pt's request.

## 2019-07-08 NOTE — ED Notes (Signed)
Pt provided with lunch tray and fluids at this time.  Pt provided with belongings, pt confirms everything in belonging bags 2/2 are his and that is al he came in with.   Pt taken to toilet to have a BM.

## 2019-07-08 NOTE — Consult Note (Signed)
[facility] Face-to-Face Psychiatry Consult   Reason for Consult: AMS Referring Physician: Dr. Larinda Buttery Patient Identification: Russell Buchanan MRN:  [id number] Principal Diagnosis: <principal problem not specified> Diagnosis:  Active Problems:   * No active hospital problems. *   Total Time spent with patient: 45 minutes  Subjective:   Russell Buchanan is a 44 y.o. male patient who presented with altered mental status  HPI: Patient is a 44 year old male patient with a history of schizophrenia who presents with altered mental status.  Writer is familiar as patient was here about a week ago with similar complaints.  At that time patient was admitted for medical reasons and seizure work-up was thus far nonconclusive.  When I had gone to see the patient he had already made a spontaneous recovery by himself.  Unclear exactly would have prompted such episode, although catatonia was deemed to be unlikely given patient's mental state upon his recovery.  The plan at that time was to discharge back to the group home for follow-up with his outpatient neurologist.  It appears that patient did not make it to this  neurology appointment for further EEG monitoring.  Today the presentation is very similar.  Last night patient was unable to speak and unarousable.  Today patient is reports feeling much better with little to no memory of what had happened over the past 12 hours.  He reports remembering going to a cardiology appointment and not much so after that.  Patient denies any suicidal homicidal ideation.  He denies any psychosis or current psychiatric complaints.  He does acknowledge hallucinations at night that have been there for many years which are chronic and nondistressing to him.    Past Psychiatric History: History of schizophrenia currently resides in a group home Risk to Self:  No Risk to Others:  No Prior Inpatient Therapy:  Yes Prior Outpatient Therapy:  Yes  Past Medical History:  Past Medical  History:  Diagnosis Date  . Antisocial personality disorder (HCC)   . Bipolar disorder (HCC)   . Environmental and seasonal allergies   . GERD (gastroesophageal reflux disease)   . Headache   . Hyperlipidemia   . Infected elbow (HCC)   . Schizophrenia (HCC)   . Seizures (HCC)   . Tremors of nervous system     Past Surgical History:  Procedure Laterality Date  . CLOSED REDUCTION WRIST FRACTURE Left   . HARDWARE REMOVAL Right 04/14/2018   Procedure: HARDWARE REMOVAL-RIGHT ELBOW;  Surgeon: Deeann Saint, MD;  Location: [facility] ORS;  Service: Orthopedics;  Laterality: Right;  . ORIF ELBOW FRACTURE Right   . SCALP LACERATION REPAIR    . WRIST RECONSTRUCTION Left    Family History:  Family History  Family history unknown: Yes   Family Psychiatric  History: Unknown Social History:  Social History   Substance and Sexual Activity  Alcohol Use No     Social History   Substance and Sexual Activity  Drug Use Not Currently    Social History   Socioeconomic History  . Marital status: Single    Spouse name: Not on file  . Number of children: Not on file  . Years of education: Not on file  . Highest education level: Not on file  Occupational History  . Not on file  Tobacco Use  . Smoking status: Current Every Day Smoker    Packs/day: 0.50    Years: 7.00    Pack years: 3.50    Types: Cigarettes  . Smokeless tobacco: Former Neurosurgeon  Types: Chew, Snuff  Substance and Sexual Activity  . Alcohol use: No  . Drug use: Not Currently  . Sexual activity: Not on file  Other Topics Concern  . Not on file  Social History Narrative  . Not on file   Social Determinants of Health   Financial Resource Strain:   . Difficulty of Paying Living Expenses: Not on file  Food Insecurity:   . Worried About Programme researcher, broadcasting/film/video in the Last Year: Not on file  . Ran Out of Food in the Last Year: Not on file  Transportation Needs:   . Lack of Transportation (Medical): Not on file  . Lack of  Transportation (Non-Medical): Not on file  Physical Activity:   . Days of Exercise per Week: Not on file  . Minutes of Exercise per Session: Not on file  Stress:   . Feeling of Stress : Not on file  Social Connections:   . Frequency of Communication with Friends and Family: Not on file  . Frequency of Social Gatherings with Friends and Family: Not on file  . Attends Religious Services: Not on file  . Active Member of Clubs or Organizations: Not on file  . Attends Banker Meetings: Not on file  . Marital Status: Not on file   Additional Social History:    Allergies:   Allergies  Allergen Reactions  . Bee Venom Hives    Labs:  Results for orders placed or performed during the hospital encounter of 07/07/19 (from the past 48 hour(s))  Glucose, capillary     Status: Abnormal   Collection Time: 07/07/19  2:55 PM  Result Value Ref Range   Glucose-Capillary 129 (H) 70 - 99 mg/dL  Comprehensive metabolic panel     Status: Abnormal   Collection Time: 07/07/19  3:04 PM  Result Value Ref Range   Sodium 138 135 - 145 mmol/L   Potassium 3.5 3.5 - 5.1 mmol/L   Chloride 99 98 - 111 mmol/L   CO2 26 22 - 32 mmol/L   Glucose, Bld 124 (H) 70 - 99 mg/dL   BUN 10 6 - 20 mg/dL   Creatinine, Ser 9.93 0.61 - 1.24 mg/dL   Calcium 9.4 8.9 - 71.6 mg/dL   Total Protein 7.9 6.5 - 8.1 g/dL   Albumin 4.5 3.5 - 5.0 g/dL   AST 25 15 - 41 U/L   ALT 25 0 - 44 U/L   Alkaline Phosphatase 86 38 - 126 U/L   Total Bilirubin 0.7 0.3 - 1.2 mg/dL   GFR calc non Af Amer >60 >60 mL/min   GFR calc Af Amer >60 >60 mL/min   Anion gap 13 5 - 15    Comment: Performed at [facility], [address] [place], [place] [address]  CBC     Status: None   Collection Time: 07/07/19  3:04 PM  Result Value Ref Range   WBC 6.7 4.0 - 10.5 K/uL   RBC 5.41 4.22 - 5.81 MIL/uL   Hemoglobin 15.9 13.0 - 17.0 g/dL   HCT 38.1 01.7 - 51.0 %   MCV 86.3 80.0 - 100.0 fL   MCH 29.4 26.0 - 34.0 pg   MCHC 34.0  30.0 - 36.0 g/dL   RDW 25.8 52.7 - 78.2 %   Platelets 229 150 - 400 K/uL   nRBC 0.0 0.0 - 0.2 %    Comment: Performed at [facility], [address], [place], [place] [address]  Valproic acid  level     Status: None   Collection Time: 07/07/19  6:56 PM  Result Value Ref Range   Valproic Acid Lvl 72 50.0 - 100.0 ug/mL    Comment: Performed at [facility], [address] [place], [place] [address]  Urine Drug Screen, Qualitative ([place] only)     Status: Abnormal   Collection Time: 07/07/19 11:02 PM  Result Value Ref Range   Tricyclic, Ur Screen NONE DETECTED NONE DETECTED   Amphetamines, Ur Screen NONE DETECTED NONE DETECTED   MDMA (Ecstasy)Ur Screen NONE DETECTED NONE DETECTED   Cocaine Metabolite,Ur [place] NONE DETECTED NONE DETECTED   Opiate, Ur Screen NONE DETECTED NONE DETECTED   Phencyclidine (PCP) Ur S NONE DETECTED NONE DETECTED   Cannabinoid 50 Ng, Ur [place] NONE DETECTED NONE DETECTED   Barbiturates, Ur Screen NONE DETECTED NONE DETECTED   Benzodiazepine, Ur Scrn POSITIVE (A) NONE DETECTED   Methadone Scn, Ur NONE DETECTED NONE DETECTED    Comment: (NOTE) Tricyclics + metabolites, urine    Cutoff 1000 ng/mL Amphetamines + metabolites, urine  Cutoff 1000 ng/mL MDMA (Ecstasy), urine              Cutoff 500 ng/mL Cocaine Metabolite, urine          Cutoff 300 ng/mL Opiate + metabolites, urine        Cutoff 300 ng/mL Phencyclidine (PCP), urine         Cutoff 25 ng/mL Cannabinoid, urine                 Cutoff 50 ng/mL Barbiturates + metabolites, urine  Cutoff 200 ng/mL Benzodiazepine, urine              Cutoff 200 ng/mL Methadone, urine                   Cutoff 300 ng/mL The urine drug screen provides only a preliminary, unconfirmed analytical test result and should not be used for non-medical purposes. Clinical consideration and professional judgment should be applied to any positive drug screen result due to possible interfering substances. A more specific  alternate chemical method must be used in order to obtain a confirmed analytical result. Gas chromatography / mass spectrometry (GC/MS) is the preferred confirmat ory method. Performed at [facility], [address] [place], [place] [address]     No current facility-administered medications for this encounter.   Current Outpatient Medications  Medication Sig Dispense Refill  . albuterol (PROVENTIL HFA;VENTOLIN HFA) 108 (90 Base) MCG/ACT inhaler Inhale 2 puffs into the lungs every 6 (six) hours as needed for wheezing or shortness of breath. 1 Inhaler 0  . benztropine (COGENTIN) 1 MG tablet Take 1 mg by mouth 2 (two) times daily as needed for tremors.     . Butalbital-APAP-Caffeine 50-325-40 MG per capsule Take 1 capsule by mouth 2 (two) times daily as needed for headache.     . cetirizine (ZYRTEC) 10 MG tablet Take 10 mg by mouth daily.    . clonazePAM (KLONOPIN) 1 MG tablet Take 1 mg by mouth 2 (two) times daily as needed for anxiety.    . clotrimazole-betamethasone (LOTRISONE) cream Apply 1 application topically 3 (three) times daily.    . divalproex (DEPAKOTE) 500 MG DR tablet Take 1 tablet (500 mg total) by mouth every morning. 30 tablet 11  . divalproex (DEPAKOTE) 500 MG DR tablet Take 2 tablets (1,000 mg total) by mouth at bedtime.    . docusate sodium (COLACE) 100 MG capsule  Take 100 mg by mouth daily.    Marland Kitchen EPINEPHrine 0.3 mg/0.3 mL IJ SOAJ injection Inject 0.3 mg into the muscle as needed (allergic reactions).     Marland Kitchen escitalopram (LEXAPRO) 20 MG tablet Take 20 mg by mouth daily.    . Fremanezumab-vfrm (AJOVY) 225 MG/1.5ML SOSY Inject into the skin every 28 (twenty-eight) days.    . furosemide (LASIX) 40 MG tablet Take 1 tablet (40 mg total) by mouth daily for 30 days. 30 tablet 0  . gabapentin (NEURONTIN) 400 MG capsule Take 1 capsule (400 mg total) by mouth 3 (three) times daily. 60 capsule 3  . gemfibrozil (LOPID) 600 MG tablet Take 600 mg by mouth 2 (two) times daily  before a meal.    . haloperidol (HALDOL) 10 MG tablet Take 10 mg by mouth 2 (two) times daily as needed (agitation).     Marland Kitchen ketoconazole (NIZORAL) 2 % cream Apply 1 application topically 2 (two) times daily.    Marland Kitchen linaclotide (LINZESS) 72 MCG capsule Take 72 mcg by mouth daily.     . lipase/protease/amylase (CREON) 36000 UNITS CPEP capsule Take 36,000 Units by mouth 3 (three) times daily with meals. 2 caps withmeals and 1 with snacks    . lipase/protease/amylase (CREON) 36000 UNITS CPEP capsule Take 36,000 Units by mouth. With snacks    . LORazepam (ATIVAN) 1 MG tablet Take 1 mg by mouth every 8 (eight) hours as needed for anxiety.    . magnesium gluconate (MAGONATE) 500 MG tablet Take 500 mg by mouth daily.    . meloxicam (MOBIC) 15 MG tablet Take 1 tablet (15 mg total) by mouth daily as needed for pain. 30 tablet 3  . metoprolol tartrate (LOPRESSOR) 50 MG tablet Take 50 mg by mouth daily.    Marland Kitchen nystatin cream (MYCOSTATIN) Apply 1 application topically 2 (two) times daily as needed for dry skin.     Marland Kitchen omeprazole (PRILOSEC) 40 MG capsule Take 40 mg by mouth 2 (two) times daily before a meal.    . ondansetron (ZOFRAN) 8 MG tablet Take 8 mg by mouth every 8 (eight) hours as needed for nausea.    . Paliperidone Palmitate ER (INVEGA TRINZA) 819 MG/2.625ML SUSY Inject into the muscle every 3 (three) months.    . polyethylene glycol (MIRALAX / GLYCOLAX) packet Take 17 g by mouth daily.    . pravastatin (PRAVACHOL) 40 MG tablet Take 40 mg by mouth every evening.    . propranolol (INNOPRAN XL) 80 MG 24 hr capsule Don't need this if taking Lopressor.    . propranolol ER (INDERAL LA) 80 MG 24 hr capsule Don't need this if taking Lopressor.    Marland Kitchen QUEtiapine (SEROQUEL) 50 MG tablet Take 75 mg by mouth at bedtime.    . simethicone (MYLICON) 80 MG chewable tablet Chew 80 mg by mouth 2 (two) times daily.    . sucralfate (CARAFATE) 1 g tablet Take 1 tablet (1 g total) by mouth 4 (four) times daily -  with meals and  at bedtime. 60 tablet 0  . SUMAtriptan (IMITREX) 100 MG tablet Take 100 mg by mouth every 2 (two) hours as needed for migraine. May repeat in 2 hours if headache persists or recurs.    Marland Kitchen tiotropium (SPIRIVA) 18 MCG inhalation capsule Don't need this if taking Stiolto. 30 capsule 12  . Tiotropium Bromide-Olodaterol (STIOLTO RESPIMAT IN) Inhale 2 puffs into the lungs daily.      Musculoskeletal: Strength & Muscle Tone: within normal limits Gait &  Station: normal Patient leans: N/A  Psychiatric Specialty Exam: Physical Exam  Review of Systems  Blood pressure 120/79, pulse 61, temperature 97.6 F (36.4 C), temperature source Oral, resp. rate 14, height 6\' 1"  (1.854 m), weight 104.3 kg, SpO2 98 %.Body mass index is 30.34 kg/m.  General Appearance: Negative and Casual  Eye Contact:  Fair  Speech:  Clear and Coherent  Volume:  Normal  Mood:  Euthymic  Affect:  Congruent  Thought Process:  Coherent  Orientation:  Full (Time, Place, and Person)  Thought Content:  Logical  Suicidal Thoughts:  No  Homicidal Thoughts:  No  Memory:  Recent;   Fair  Judgement:  Intact  Insight:  Fair  Psychomotor Activity:  Negative  Concentration:  Concentration: Fair  Recall:  of Knowledge:  Fair  Language:  Fair  Akathisia:  No  Handed:  Right  AIMS (if indicated):     Assets:  Communication Skills Desire for Improvement Financial Resources/Insurance Housing Leisure Time Resilience Social Support  ADL's:  Intact  Cognition:  WNL  Sleep:        Treatment Plan Summary: 44 year old male presenting with AMS. No psychiatric concerns at this time. Catatonia or other functional neurologic symptoms are thought to be very unlikely given his status as well as spontaneous return to baseline. Writer strongly feels that patient is not feigning the symptoms in any way. Careful look at his chart shows use of Imitrex along with several psychiatric medications which could potentially result in  serotonin syndrome-like episodes which may precipitate seizure.  Recommendations: Patient will likely require further neurologic work-up unclear if this is best done as inpatient or outpatient.  Hold Imitrex and do not use along with other psychiatric medications.  No need for psychiatric hospitalization.  Disposition: No evidence of imminent risk to self or others at present.   Patient does not meet criteria for psychiatric inpatient admission. Supportive therapy provided about ongoing stressors. Discussed crisis plan, support from social network, calling 911, coming to the Emergency Department, and calling Suicide Hotline.  55, MD 07/08/2019 1:31 PM

## 2019-07-08 NOTE — ED Notes (Signed)
This Rn spike with caregiver, Marlene Bast [phone] to provide DC instructions and pt status update. Russell Buchanan verbalizes understanding of DC instructions.Marland Kitchen

## 2019-07-08 NOTE — ED Notes (Signed)
This RN [facility] co. And confirmed transportation with [employer]. ETA 1hour.

## 2019-07-08 NOTE — ED Notes (Signed)
Patient awake at this time asking to use restroom. Asked this RN "when can I go home? How long have I been out of it?" This RN explained to patient he is waiting to speak with psychiatry. Patient given coke and sprite

## 2019-08-02 ENCOUNTER — Emergency Department: Payer: Medicaid Other

## 2019-08-02 ENCOUNTER — Other Ambulatory Visit: Payer: Self-pay

## 2019-08-02 ENCOUNTER — Emergency Department
Admission: EM | Admit: 2019-08-02 | Discharge: 2019-08-02 | Disposition: A | Discharge status: Home / Self Care | Payer: Medicaid Other | Attending: Emergency Medicine | Admitting: Emergency Medicine

## 2019-08-02 DIAGNOSIS — F17210 Nicotine dependence, cigarettes, uncomplicated: Secondary | ICD-10-CM | POA: Diagnosis not present

## 2019-08-02 DIAGNOSIS — R569 Unspecified convulsions: Secondary | ICD-10-CM | POA: Diagnosis not present

## 2019-08-02 DIAGNOSIS — R4182 Altered mental status, unspecified: Secondary | ICD-10-CM | POA: Insufficient documentation

## 2019-08-02 LAB — URINE DRUG SCREEN, QUALITATIVE (ARMC ONLY)
Amphetamines, Ur Screen: NOT DETECTED
Barbiturates, Ur Screen: NOT DETECTED
Benzodiazepine, Ur Scrn: NOT DETECTED
Cannabinoid 50 Ng, Ur ~~LOC~~: NOT DETECTED
Cocaine Metabolite,Ur ~~LOC~~: NOT DETECTED
MDMA (Ecstasy)Ur Screen: NOT DETECTED
Methadone Scn, Ur: NOT DETECTED
Opiate, Ur Screen: NOT DETECTED
Phencyclidine (PCP) Ur S: NOT DETECTED
Tricyclic, Ur Screen: NOT DETECTED

## 2019-08-02 LAB — COMPREHENSIVE METABOLIC PANEL
ALT: 21 U/L (ref 0–44)
AST: 30 U/L (ref 15–41)
Albumin: 4.4 g/dL (ref 3.5–5.0)
Alkaline Phosphatase: 113 U/L (ref 38–126)
Anion gap: 15 (ref 5–15)
BUN: 6 mg/dL (ref 6–20)
CO2: 26 mmol/L (ref 22–32)
Calcium: 9.8 mg/dL (ref 8.9–10.3)
Chloride: 98 mmol/L (ref 98–111)
Creatinine, Ser: 0.73 mg/dL (ref 0.61–1.24)
GFR calc Af Amer: >60 mL/min (ref >60)
GFR calc non Af Amer: >60 mL/min (ref >60)
Glucose, Bld: 107 mg/dL — ABNORMAL HIGH (ref 70–99)
Potassium: 3.8 mmol/L (ref 3.5–5.1)
Sodium: 139 mmol/L (ref 135–145)
Total Bilirubin: 0.8 mg/dL (ref 0.3–1.2)
Total Protein: 7.8 g/dL (ref 6.5–8.1)

## 2019-08-02 LAB — URINALYSIS, COMPLETE (UACMP) WITH MICROSCOPIC
Glucose, UA: NEGATIVE mg/dL
Hgb urine dipstick: NEGATIVE
Ketones, ur: 20 mg/dL — AB
Leukocytes,Ua: NEGATIVE
Nitrite: NEGATIVE
Protein, ur: 100 mg/dL — AB
Specific Gravity, Urine: 1.021 (ref 1.005–1.030)
Squamous Epithelial / HPF: NONE SEEN (ref 0–5)
pH: 6 (ref 5.0–8.0)

## 2019-08-02 LAB — LACTIC ACID, PLASMA
Lactic Acid, Venous: 2.4 mmol/L (ref 0.5–1.9)
Lactic Acid, Venous: 2.6 mmol/L (ref 0.5–1.9)
Lactic Acid, Venous: 2.4 mmol/L (ref 0.5–1.9)

## 2019-08-02 LAB — ETHANOL: Alcohol, Ethyl (B): <10 mg/dL (ref <10)

## 2019-08-02 LAB — CBC WITH DIFFERENTIAL/PLATELET
Abs Immature Granulocytes: 0.03 10*3/uL (ref 0.00–0.07)
Basophils Absolute: 0 10*3/uL (ref 0.0–0.1)
Basophils Relative: 0 %
Eosinophils Absolute: 0 10*3/uL (ref 0.0–0.5)
Eosinophils Relative: 0 %
HCT: 50.2 % (ref 39.0–52.0)
Hemoglobin: 17.2 g/dL — ABNORMAL HIGH (ref 13.0–17.0)
Immature Granulocytes: 0 %
Lymphocytes Relative: 15 %
Lymphs Abs: 1.5 10*3/uL (ref 0.7–4.0)
MCH: 29.6 pg (ref 26.0–34.0)
MCHC: 34.3 g/dL (ref 30.0–36.0)
MCV: 86.3 fL (ref 80.0–100.0)
Monocytes Absolute: 1 10*3/uL (ref 0.1–1.0)
Monocytes Relative: 10 %
Neutro Abs: 7.1 10*3/uL (ref 1.7–7.7)
Neutrophils Relative %: 75 %
Platelets: 198 10*3/uL (ref 150–400)
RBC: 5.82 MIL/uL — ABNORMAL HIGH (ref 4.22–5.81)
RDW: 13.7 % (ref 11.5–15.5)
WBC: 9.6 10*3/uL (ref 4.0–10.5)
nRBC: 0 % (ref 0.0–0.2)

## 2019-08-02 LAB — SALICYLATE LEVEL: Salicylate Lvl: <7 mg/dL — ABNORMAL LOW (ref 7.0–30.0)

## 2019-08-02 LAB — ACETAMINOPHEN LEVEL: Acetaminophen (Tylenol), Serum: <10 ug/mL — ABNORMAL LOW (ref 10–30)

## 2019-08-02 LAB — TROPONIN I (HIGH SENSITIVITY): Troponin I (High Sensitivity): 4 ng/L (ref <18)

## 2019-08-02 LAB — VALPROIC ACID LEVEL: Valproic Acid Lvl: 92 ug/mL (ref 50.0–100.0)

## 2019-08-02 MED ORDER — SODIUM CHLORIDE 0.9 % IV BOLUS
1000.0000 mL | Freq: Once | INTRAVENOUS | Status: AC
  Administered 2019-08-02: 1000 mL via INTRAVENOUS

## 2019-08-02 MED ORDER — SODIUM CHLORIDE 0.9 % IV BOLUS
2000.0000 mL | Freq: Once | INTRAVENOUS | Status: AC
  Administered 2019-08-02: 17:00:00 2000 mL via INTRAVENOUS

## 2019-08-02 NOTE — ED Notes (Signed)
In and out catheter was performed with sterile technique per orders.  Pt tolerated well, no reaction to procedure noted.  Pt remains lethargic, difficult to arouse and confused speech.  Will continue to monitor/reassess.

## 2019-08-02 NOTE — ED Notes (Signed)
Pt to ct via gurney at this time with Radiology Tech.

## 2019-08-02 NOTE — ED Notes (Signed)
Pt becoming uncooperative with care.  Pulled out IV and is walking around room going through belongings. Moved to hallway bed 5 for better observation and safety.  Pt requires frequent coaching and redirecting.  Will continue to monitor/reassess.

## 2019-08-02 NOTE — ED Notes (Signed)
Pt awake and alert.  Cannot recall events leading up to arrival, however, alert to person, place, and time.  Able to state address and name of group home as well.  No further acute changes noted.  Will continue to monitor/reassess

## 2019-08-02 NOTE — ED Provider Notes (Signed)
  Physical Exam  BP 137/89 (BP Location: Left Arm)   Pulse 89   Temp 98.6 F (37 C) (Axillary)   Resp 18   Ht 6\' 1"  (1.854 m)   Wt 104 kg   SpO2 97%   BMI 30.25 kg/m   Physical Exam  ED Course/Procedures     Procedures  MDM  Care assumed at 3 PM.  Patient has a history of schizophrenia and seizure and here with altered mental status.  Initial lactate was elevated 2.4 signout pending reassessment of his mental status and repeat lactate.  7:18 PM   CT head unremarkable.  Lactate remained stable at 2.4.  Patient has been eating and drinking well and back to baseline. Stable for discharge back to facility    , MD 08/02/19 1919

## 2019-08-02 NOTE — ED Notes (Signed)
Pt back from CT via gurney without incident.  Placed back on bedside monitor.  Will continue to monitor/reassess.

## 2019-08-02 NOTE — ED Triage Notes (Signed)
Pt arrives via ems for altered mental status, found by staff lying on the tile floor, wet in urine, responding to pain , will move eyes to look and groans at times, last time seen himself was last pm, at this care home patients are allowed to come and go as they please according to staff in report to ems

## 2019-08-02 NOTE — ED Notes (Signed)
Pt ambulated to chair from bed without incident.  Linens changed, pt given clean gown and cleaned himself with Premoistened wipes.  Pt given regular diet tray per orders and request. Tolerating well. Updated on plan of care, verbalized understanding will continue to monitor/reassess.

## 2019-08-02 NOTE — Discharge Instructions (Signed)
Continue your current meds   See your doctor  Return to ER if you have another seizure, vomiting, fever

## 2019-08-02 NOTE — ED Notes (Signed)
Contacted [facility] in [place].  Facility has account with [name] for transfer back to facility.

## 2019-08-02 NOTE — ED Provider Notes (Addendum)
[facility] Emergency Department Provider Note ____________________________________________   First MD Initiated Contact with Patient 08/02/19 1046     (approximate)  I have reviewed the triage vital signs and the nursing notes.   HISTORY  Chief Complaint Altered Mental Status  Level 5 caveat: History present illness limited due to altered mental status  HPI Russell Buchanan is a 44 y.o. male with PMH as noted below including schizophrenia and seizure disorder who presents from his care home with altered mental status.  Per EMS, the patient was found by staff lying on the floor this morning and his clothing was soaked in urine.  He was last seen yesterday evening.  Patients at this care home are allowed to leave the facility on their own.  The patient is able to answer some questions, although cannot give clear history.  He denies any acute pain.    Past Medical History:  Diagnosis Date  . Antisocial personality disorder (HCC)   . Bipolar disorder (HCC)   . Environmental and seasonal allergies   . GERD (gastroesophageal reflux disease)   . Headache   . Hyperlipidemia   . Infected elbow (HCC)   . Schizophrenia (HCC)   . Seizures (HCC)   . Tremors of nervous system     Patient Active Problem List   Diagnosis Date Noted  . AMS (altered mental status) 06/25/2019  . Bipolar disorder (HCC)   . Antisocial personality disorder (HCC)   . Acute encephalopathy 12/04/2016  . GERD (gastroesophageal reflux disease) 12/04/2016  . HLD (hyperlipidemia) 12/04/2016  . Schizophrenia (HCC) 11/15/2016  . Medication side effects 11/15/2016    Past Surgical History:  Procedure Laterality Date  . CLOSED REDUCTION WRIST FRACTURE Left   . HARDWARE REMOVAL Right 04/14/2018   Procedure: HARDWARE REMOVAL-RIGHT ELBOW;  Surgeon: Deeann Saint, MD;  Location: [facility] ORS;  Service: Orthopedics;  Laterality: Right;  . ORIF ELBOW FRACTURE Right   . SCALP LACERATION REPAIR      . WRIST RECONSTRUCTION Left     Prior to Admission medications   Medication Sig Start Date End Date Taking? Authorizing Provider  albuterol (PROVENTIL HFA;VENTOLIN HFA) 108 (90 Base) MCG/ACT inhaler Inhale 2 puffs into the lungs every 6 (six) hours as needed for wheezing or shortness of breath. 01/30/17   Merrily Brittle, MD  benztropine (COGENTIN) 1 MG tablet Take 1 mg by mouth 2 (two) times daily as needed for tremors.     [provider]  Butalbital-APAP-Caffeine 50-325-40 MG per capsule Take 1 capsule by mouth 2 (two) times daily as needed for headache.     [provider]  cetirizine (ZYRTEC) 10 MG tablet Take 10 mg by mouth daily.    [provider]  clonazePAM (KLONOPIN) 1 MG tablet Take 1 mg by mouth 2 (two) times daily as needed for anxiety.    [provider]  clotrimazole-betamethasone (LOTRISONE) cream Apply 1 application topically 3 (three) times daily.    [provider]  divalproex (DEPAKOTE) 500 MG DR tablet Take 1 tablet (500 mg total) by mouth every morning. 06/27/19 06/26/20  Darlin Priestly, MD  divalproex (DEPAKOTE) 500 MG DR tablet Take 2 tablets (1,000 mg total) by mouth at bedtime. 06/27/19   Darlin Priestly, MD  docusate sodium (COLACE) 100 MG capsule Take 100 mg by mouth daily.    [provider]  EPINEPHrine 0.3 mg/0.3 mL IJ SOAJ injection Inject 0.3 mg into the muscle as needed (allergic reactions).     [provider]  escitalopram (LEXAPRO) 20 MG tablet Take 20 mg by mouth daily.    [provider]  Fremanezumab-vfrm (AJOVY) 225 MG/1.5ML SOSY Inject into the skin every 28 (twenty-eight) days.    [provider]  furosemide (LASIX) 40 MG tablet Take 1 tablet (40 mg total) by mouth daily for 30 days. 06/25/18 06/25/19  Minna Antis, MD  gabapentin (NEURONTIN) 400 MG capsule Take 1 capsule (400 mg total) by mouth 3 (three) times daily. 04/14/18   Deeann Saint, MD  gemfibrozil (LOPID) 600 MG tablet  Take 600 mg by mouth 2 (two) times daily before a meal.    [provider]  haloperidol (HALDOL) 10 MG tablet Take 10 mg by mouth 2 (two) times daily as needed (agitation).     [provider]  ketoconazole (NIZORAL) 2 % cream Apply 1 application topically 2 (two) times daily.    [provider]  linaclotide (LINZESS) 72 MCG capsule Take 72 mcg by mouth daily.     [provider]  lipase/protease/amylase (CREON) 36000 UNITS CPEP capsule Take 36,000 Units by mouth 3 (three) times daily with meals. 2 caps withmeals and 1 with snacks    [provider]  lipase/protease/amylase (CREON) 36000 UNITS CPEP capsule Take 36,000 Units by mouth. With snacks    [provider]  LORazepam (ATIVAN) 1 MG tablet Take 1 mg by mouth every 8 (eight) hours as needed for anxiety.    [provider]  magnesium gluconate (MAGONATE) 500 MG tablet Take 500 mg by mouth daily.    [provider]  meloxicam (MOBIC) 15 MG tablet Take 1 tablet (15 mg total) by mouth daily as needed for pain. 06/27/19   Darlin Priestly, MD  metoprolol tartrate (LOPRESSOR) 50 MG tablet Take 50 mg by mouth daily.    [provider]  nystatin cream (MYCOSTATIN) Apply 1 application topically 2 (two) times daily as needed for dry skin.     [provider]  omeprazole (PRILOSEC) 40 MG capsule Take 40 mg by mouth 2 (two) times daily before a meal. 04/16/19   [provider]  ondansetron (ZOFRAN) 8 MG tablet Take 8 mg by mouth every 8 (eight) hours as needed for nausea. 06/16/19   [provider]  Paliperidone Palmitate ER (INVEGA TRINZA) 819 MG/2.625ML SUSY Inject into the muscle every 3 (three) months.    [provider]  polyethylene glycol (MIRALAX / GLYCOLAX) packet Take 17 g by mouth daily.    [provider]  pravastatin (PRAVACHOL) 40 MG tablet Take 40 mg by mouth every evening. 06/24/19   [provider]  propranolol  (INNOPRAN XL) 80 MG 24 hr capsule Don't need this if taking Lopressor. 06/27/19   Darlin Priestly, MD  propranolol ER (INDERAL LA) 80 MG 24 hr capsule Don't need this if taking Lopressor. 06/27/19   Darlin Priestly, MD  QUEtiapine (SEROQUEL) 50 MG tablet Take 75 mg by mouth at bedtime. 06/24/19   [provider]  simethicone (MYLICON) 80 MG chewable tablet Chew 80 mg by mouth 2 (two) times daily.    [provider]  sucralfate (CARAFATE) 1 g tablet Take 1 tablet (1 g total) by mouth 4 (four) times daily -  with meals and at bedtime. 05/13/18   Willy Eddy, MD  SUMAtriptan (IMITREX) 100 MG tablet Take 100 mg by mouth every 2 (two) hours as needed for migraine. May repeat in 2 hours if headache persists or recurs.    [provider]  tiotropium (SPIRIVA) 18 MCG inhalation capsule Don't need this if taking Stiolto. 06/27/19   Enzo Bi, MD  Tiotropium Bromide-Olodaterol (STIOLTO RESPIMAT IN) Inhale 2 puffs into the lungs daily.    [provider]    Allergies Bee venom  Family History  Family history unknown: Yes    Social History Social History   Tobacco Use  . Smoking status: Current Every Day Smoker    Packs/day: 0.50    Years: 7.00    Pack years: 3.50    Types: Cigarettes  . Smokeless tobacco: Former Systems developer    Types: Chew, Snuff  Substance Use Topics  . Alcohol use: No  . Drug use: Not Currently    Review of Systems Level 5 caveat: Unable to obtain review of systems due to altered mental status    ____________________________________________   PHYSICAL EXAM:  VITAL SIGNS: ED Triage Vitals  Enc Vitals Group     BP 08/02/19 1046 124/90     Pulse Rate 08/02/19 1046 70     Resp 08/02/19 1046 16     Temp 08/02/19 1046 98.6 F (37 C)     Temp Source 08/02/19 1046 Axillary     SpO2 08/02/19 1046 98 %     Weight 08/02/19 1048 229 lb 4.5 oz (104 kg)     Height 08/02/19 1048 6\' 1"  (1.854 m)     Head Circumference --      Peak Flow --      Pain  Score 08/02/19 1047 0     Pain Loc --      Pain Edu? --      Excl. in [place]? --     Constitutional: Somnolent appearing but opening eyes and responding to some questions. Eyes: Conjunctivae are normal.  EOMI.  PERRLA. Head: Atraumatic. Nose: No congestion/rhinnorhea. Mouth/Throat: Mucous membranes are dry.   Neck: Normal range of motion.  Cardiovascular: Normal rate, regular rhythm. Grossly normal heart sounds.  Good peripheral circulation. Respiratory: Normal respiratory effort.  No retractions. Lungs CTAB. Gastrointestinal: Soft and nontender. No distention.  Genitourinary: No flank tenderness. Musculoskeletal: Extremities warm and well perfused.  Neurologic: Motor intact in all extremities.  Intermittent twitching to the extremities. Skin:  Skin is warm and dry. No rash noted. Psychiatric: Unable to assess.  ____________________________________________   LABS (all labs ordered are listed, but only abnormal results are displayed)  Labs Reviewed  COMPREHENSIVE METABOLIC PANEL - Abnormal; Notable for the following components:      Result Value   Glucose, Bld 107 (*)    All other components within normal limits  ACETAMINOPHEN LEVEL - Abnormal; Notable for the following components:   Acetaminophen (Tylenol), Serum <10 (*)    All other components within normal limits  SALICYLATE LEVEL - Abnormal; Notable for the following components:   Salicylate Lvl <8.3 (*)    All other components within normal limits  LACTIC ACID, PLASMA - Abnormal; Notable for the following components:   Lactic Acid, Venous 2.4 (*)    All other components within normal limits  CBC WITH DIFFERENTIAL/PLATELET - Abnormal; Notable for the following components:   RBC 5.82 (*)    Hemoglobin 17.2 (*)    All other components within normal limits  URINALYSIS, COMPLETE (UACMP) WITH MICROSCOPIC - Abnormal; Notable for the following components:   Color, Urine AMBER (*)    APPearance CLEAR (*)    Bilirubin Urine SMALL  (*)    Ketones, ur 20 (*)    Protein, ur 100 (*)  Bacteria, UA RARE (*)    All other components within normal limits  ETHANOL  URINE DRUG SCREEN, QUALITATIVE ([facility] ONLY)  VALPROIC ACID LEVEL  LACTIC ACID, PLASMA  LEVETIRACETAM LEVEL  TROPONIN I (HIGH SENSITIVITY)  TROPONIN I (HIGH SENSITIVITY)   ____________________________________________  EKG  ED ECG REPORT I, Dionne Bucy, the attending physician, personally viewed and interpreted this ECG.  Date: 08/02/2019 EKG Time: 1044 Rate: 68 Rhythm: normal sinus rhythm QRS Axis: normal Intervals: normal ST/T Wave abnormalities: normal Narrative Interpretation: no evidence of acute ischemia  ____________________________________________  RADIOLOGY  CT head: No ICH or other acute abnormality  ____________________________________________   PROCEDURES  Procedure(s) performed: No  Procedures  Critical Care performed: No ____________________________________________   INITIAL IMPRESSION / ASSESSMENT AND PLAN / ED COURSE  Pertinent labs & imaging results that were available during my care of the patient were reviewed by me and considered in my medical decision making (see chart for details).  44 year old male with PMH as noted above including a history of schizophrenia and seizure disorder presents with altered mental status.  He was found on the ground this morning and his clothing was soaked in urine.  He was last seen yesterday evening.  The patient is unable to give any significant history; he initially responded "JR" to several of my questions, but was able to state his name and when asked if he was having any pain said no.  I reviewed the past medical records in epic.  The patient was admitted last month with altered mental status, thought to be due to possible seizures.  He had an EEG showing no epileptiform activity, and a negative MRI.  He returned to his baseline without intervention.  Subsequently, he was  seen in the ED earlier this month with altered mental status with a negative medical work-up.  He was then evaluated by psychiatry but also returned to his baseline and was cleared for discharge.  On exam today, the vital signs are normal.  The patient is awake and lying in the bed with his eyes mostly open but slow to respond.  He was able to tell me his name, answered no when asked if he had pain, but also responded "JR" to several unrelated questions.  He is able to squeeze my hands and move both legs but also demonstrates intermittent twitching.  The remainder of the exam is as described above.  This appears to be similar to the patient's prior presentations.  Differential includes seizure/postictal state, medication side effects, substance abuse, metabolic etiology, or possible psychiatric etiology such as some type of catatonia.  We will obtain a CT, lab work-up, and reassess.  If the work-up is negative for acute findings, anticipate that we will observe the patient until his mental status returns to baseline.  ----------------------------------------- 2:53 PM on 08/02/2019 -----------------------------------------  The CT head was negative.  The lab work-up is unremarkable except the patient's initial lactate was slightly elevated.  On reassessment, his mental status has improved and he now is much more alert and able to answer questions appropriately.  He denies acute complaints and states he feels "so-so."  Overall I suspect that the patient had a seizure and was postictal.  He is currently receiving fluids and we will repeat the lactate.  There is no clinical evidence for sepsis.  If the lactate clears and his mental status continues to normalize, I anticipate he will be appropriate for discharge back to his facility.  I am signing him out to the  oncoming physician. ____________________________________________   FINAL CLINICAL IMPRESSION(S) / ED DIAGNOSES  Final diagnoses:  Altered  mental status, unspecified altered mental status type      NEW MEDICATIONS STARTED DURING THIS VISIT:  New Prescriptions   No medications on file     Note:  This document was prepared using Dragon voice recognition software and may include unintentional dictation errors.    Dionne Bucy, MD 08/02/19 1454    Dionne Bucy, MD 08/02/19 1513

## 2019-08-14 LAB — LEVETIRACETAM LEVEL: Levetiracetam Lvl: <1 ug/mL — ABNORMAL LOW (ref 10.0–40.0)

## 2019-11-21 ENCOUNTER — Encounter: Payer: Self-pay | Admitting: *Deleted

## 2019-11-21 ENCOUNTER — Other Ambulatory Visit: Payer: Self-pay

## 2019-11-21 ENCOUNTER — Emergency Department
Admission: EM | Admit: 2019-11-21 | Discharge: 2019-11-21 | Disposition: A | Discharge status: Home / Self Care | Payer: Medicaid Other | Attending: Emergency Medicine | Admitting: Emergency Medicine

## 2019-11-21 ENCOUNTER — Emergency Department: Payer: Medicaid Other

## 2019-11-21 DIAGNOSIS — S93401A Sprain of unspecified ligament of right ankle, initial encounter: Secondary | ICD-10-CM

## 2019-11-21 DIAGNOSIS — Y92198 Other place in other specified residential institution as the place of occurrence of the external cause: Secondary | ICD-10-CM | POA: Insufficient documentation

## 2019-11-21 DIAGNOSIS — Y9389 Activity, other specified: Secondary | ICD-10-CM | POA: Insufficient documentation

## 2019-11-21 DIAGNOSIS — Y999 Unspecified external cause status: Secondary | ICD-10-CM | POA: Insufficient documentation

## 2019-11-21 DIAGNOSIS — W010XXA Fall on same level from slipping, tripping and stumbling without subsequent striking against object, initial encounter: Secondary | ICD-10-CM | POA: Insufficient documentation

## 2019-11-21 DIAGNOSIS — F17210 Nicotine dependence, cigarettes, uncomplicated: Secondary | ICD-10-CM | POA: Diagnosis not present

## 2019-11-21 DIAGNOSIS — S99911A Unspecified injury of right ankle, initial encounter: Secondary | ICD-10-CM | POA: Diagnosis present

## 2019-11-21 MED ORDER — KETOROLAC TROMETHAMINE 30 MG/ML IJ SOLN
30.0000 mg | Freq: Once | INTRAMUSCULAR | Status: AC
  Administered 2019-11-21: 30 mg via INTRAMUSCULAR
  Filled 2019-11-21: qty 1

## 2019-11-21 MED ORDER — NAPROXEN 500 MG PO TABS: 500.0000 mg | ORAL_TABLET | Freq: Two times a day (BID) | ORAL | 0 refills | Status: DC

## 2019-11-21 NOTE — ED Notes (Signed)
Ace Wrap applied to Right Ankle.

## 2019-11-21 NOTE — ED Notes (Signed)
Patient unable to sign due to [facility] not having E-Signature pad and computer in room being un-operational.

## 2019-11-21 NOTE — ED Notes (Signed)
Urine sample sent if needed

## 2019-11-21 NOTE — ED Notes (Signed)
Upon transferring pt to treatment bed pt needed an increased amount of assistance. Pt reported to this RN, "It is hard to stand when there is bone sticking out of your ankle." RN clarified by asking if pt thought there was bones sticking out of his ankle at this time and pt verified he thought that was the cause of his pain. MD aware.

## 2019-11-21 NOTE — ED Triage Notes (Addendum)
Pt to ED from [facility] after falling out of bed and reporting pain on the top of his feet. Pt is very drowsy upon arrival but takes Seroquel per EMS and is normally this way at night. No reports or signs of head injury.

## 2019-11-21 NOTE — ED Notes (Signed)
Pt having difficulty ambulating in lobby and states he is having back bilateral leg pains. Pt needed staff assist to ambulate in lobby which he states is not normal for him.

## 2019-11-21 NOTE — ED Notes (Signed)
Patient informed RN that we will need to call a taxi for patient. RN informed patient that the hospital is not responsible for transportation to and from the hospital that is not within the patients plan of care.   RN informed patient there is a phone in the front lobby to call friends, family or an [employer] [facility] that is free to use.

## 2019-11-21 NOTE — ED Provider Notes (Signed)
[facility] Emergency Department Provider Note   ____________________________________________    I have reviewed the triage vital signs and the nursing notes.   HISTORY  Chief Complaint Foot Pain     HPI Russell Buchanan is a 44 y.o. male with a history as noted below who presents from group home after a mechanical fall.  Patient reports he walked into the hallway and lost his balance and fell forward with his legs flexing underneath his buttocks.  He complains primarily of right ankle pain.  Reports the pain radiates in his right ankle up towards his knee as well.  Very minimal left ankle discomfort.  Has not take anything for this.  Past Medical History:  Diagnosis Date  . Antisocial personality disorder (HCC)   . Bipolar disorder (HCC)   . Environmental and seasonal allergies   . GERD (gastroesophageal reflux disease)   . Headache   . Hyperlipidemia   . Infected elbow (HCC)   . Schizophrenia (HCC)   . Seizures (HCC)   . Tremors of nervous system     Patient Active Problem List   Diagnosis Date Noted  . AMS (altered mental status) 06/25/2019  . Bipolar disorder (HCC)   . Antisocial personality disorder (HCC)   . Acute encephalopathy 12/04/2016  . GERD (gastroesophageal reflux disease) 12/04/2016  . HLD (hyperlipidemia) 12/04/2016  . Schizophrenia (HCC) 11/15/2016  . Medication side effects 11/15/2016    Past Surgical History:  Procedure Laterality Date  . CLOSED REDUCTION WRIST FRACTURE Left   . HARDWARE REMOVAL Right 04/14/2018   Procedure: HARDWARE REMOVAL-RIGHT ELBOW;  Surgeon: Deeann Saint, MD;  Location: [facility] ORS;  Service: Orthopedics;  Laterality: Right;  . ORIF ELBOW FRACTURE Right   . SCALP LACERATION REPAIR    . WRIST RECONSTRUCTION Left     Prior to Admission medications   Medication Sig Start Date End Date Taking? Authorizing Provider  albuterol (PROVENTIL HFA;VENTOLIN HFA) 108 (90 Base) MCG/ACT inhaler Inhale 2  puffs into the lungs every 6 (six) hours as needed for wheezing or shortness of breath. 01/30/17   Merrily Brittle, MD  benztropine (COGENTIN) 1 MG tablet Take 1 mg by mouth 2 (two) times daily as needed for tremors.     [provider]  Butalbital-APAP-Caffeine 50-325-40 MG per capsule Take 1 capsule by mouth 2 (two) times daily as needed for headache.     [provider]  cetirizine (ZYRTEC) 10 MG tablet Take 10 mg by mouth daily.    [provider]  clonazePAM (KLONOPIN) 1 MG tablet Take 1 mg by mouth 2 (two) times daily as needed for anxiety.    [provider]  clotrimazole-betamethasone (LOTRISONE) cream Apply 1 application topically 3 (three) times daily.    [provider]  divalproex (DEPAKOTE) 500 MG DR tablet Take 1 tablet (500 mg total) by mouth every morning. 06/27/19 06/26/20  Darlin Priestly, MD  divalproex (DEPAKOTE) 500 MG DR tablet Take 2 tablets (1,000 mg total) by mouth at bedtime. 06/27/19   Darlin Priestly, MD  docusate sodium (COLACE) 100 MG capsule Take 100 mg by mouth daily.    [provider]  EPINEPHrine 0.3 mg/0.3 mL IJ SOAJ injection Inject 0.3 mg into the muscle as needed (allergic reactions).     [provider]  escitalopram (LEXAPRO) 20 MG tablet Take 20 mg by mouth daily.    [provider]  Fremanezumab-vfrm (AJOVY) 225 MG/1.5ML SOSY Inject into the skin every 28 (twenty-eight) days.  [provider]  furosemide (LASIX) 40 MG tablet Take 1 tablet (40 mg total) by mouth daily for 30 days. 06/25/18 06/25/19  Minna Antis, MD  gabapentin (NEURONTIN) 400 MG capsule Take 1 capsule (400 mg total) by mouth 3 (three) times daily. 04/14/18   Deeann Saint, MD  gemfibrozil (LOPID) 600 MG tablet Take 600 mg by mouth 2 (two) times daily before a meal.    [provider]  haloperidol (HALDOL) 10 MG tablet Take 10 mg by mouth 2 (two) times daily as needed (agitation).     [provider]    ketoconazole (NIZORAL) 2 % cream Apply 1 application topically 2 (two) times daily.    [provider]  linaclotide (LINZESS) 72 MCG capsule Take 72 mcg by mouth daily.     [provider]  lipase/protease/amylase (CREON) 36000 UNITS CPEP capsule Take 36,000 Units by mouth 3 (three) times daily with meals. 2 caps withmeals and 1 with snacks    [provider]  lipase/protease/amylase (CREON) 36000 UNITS CPEP capsule Take 36,000 Units by mouth. With snacks    [provider]  LORazepam (ATIVAN) 1 MG tablet Take 1 mg by mouth every 8 (eight) hours as needed for anxiety.    [provider]  magnesium gluconate (MAGONATE) 500 MG tablet Take 500 mg by mouth daily.    [provider]  meloxicam (MOBIC) 15 MG tablet Take 1 tablet (15 mg total) by mouth daily as needed for pain. 06/27/19   Darlin Priestly, MD  metoprolol tartrate (LOPRESSOR) 50 MG tablet Take 50 mg by mouth daily.    [provider]  naproxen (NAPROSYN) 500 MG tablet Take 1 tablet (500 mg total) by mouth 2 (two) times daily with a meal. 11/21/19   Jene Every, MD  nystatin cream (MYCOSTATIN) Apply 1 application topically 2 (two) times daily as needed for dry skin.     [provider]  omeprazole (PRILOSEC) 40 MG capsule Take 40 mg by mouth 2 (two) times daily before a meal. 04/16/19   [provider]  ondansetron (ZOFRAN) 8 MG tablet Take 8 mg by mouth every 8 (eight) hours as needed for nausea. 06/16/19   [provider]  Paliperidone Palmitate ER (INVEGA TRINZA) 819 MG/2.625ML SUSY Inject into the muscle every 3 (three) months.    [provider]  polyethylene glycol (MIRALAX / GLYCOLAX) packet Take 17 g by mouth daily.    [provider]  pravastatin (PRAVACHOL) 40 MG tablet Take 40 mg by mouth every evening. 06/24/19   [provider]  propranolol (INNOPRAN XL) 80 MG 24 hr capsule Don't need this if taking Lopressor. 06/27/19    Darlin Priestly, MD  propranolol ER (INDERAL LA) 80 MG 24 hr capsule Don't need this if taking Lopressor. 06/27/19   Darlin Priestly, MD  QUEtiapine (SEROQUEL) 50 MG tablet Take 75 mg by mouth at bedtime. 06/24/19   [provider]  simethicone (MYLICON) 80 MG chewable tablet Chew 80 mg by mouth 2 (two) times daily.    [provider]  sucralfate (CARAFATE) 1 g tablet Take 1 tablet (1 g total) by mouth 4 (four) times daily -  with meals and at bedtime. 05/13/18   Willy Eddy, MD  SUMAtriptan (IMITREX) 100 MG tablet Take 100 mg by mouth every 2 (two) hours as needed for migraine. May repeat in 2 hours if headache persists or recurs.    [provider]  tiotropium (SPIRIVA) 18 MCG inhalation capsule Don't  need this if taking Stiolto. 06/27/19   Darlin Priestly, MD  Tiotropium Bromide-Olodaterol (STIOLTO RESPIMAT IN) Inhale 2 puffs into the lungs daily.    [provider]     Allergies Bee venom  Family History  Family history unknown: Yes    Social History Social History   Tobacco Use  . Smoking status: Current Every Day Smoker    Packs/day: 0.50    Years: 7.00    Pack years: 3.50    Types: Cigarettes  . Smokeless tobacco: Former Neurosurgeon    Types: Chew, Snuff  Vaping Use  . Vaping Use: Never used  Substance Use Topics  . Alcohol use: No  . Drug use: Not Currently    Review of Systems  Constitutional: No fever/chills Eyes: No visual changes.  ENT: No neck pain Cardiovascular: Denies chest pain. Respiratory: Denies shortness of breath. Gastrointestinal: No abdominal pain.    Genitourinary: Negative for dysuria. Musculoskeletal: As above, no back pain Skin: Negative for rash. Neurological: Negative for headaches   ____________________________________________   PHYSICAL EXAM:  VITAL SIGNS: ED Triage Vitals [11/21/19 0419]  Enc Vitals Group     BP 126/87     Pulse Rate 72     Resp 16     Temp 98.5 F (36.9 C)     Temp Source Oral     SpO2 100  %     Weight 104 kg (229 lb 4.5 oz)     Height 1.854 m (6\' 1" )     Head Circumference      Peak Flow      Pain Score 10     Pain Loc      Pain Edu?      Excl. in GC?     Constitutional: Alert and oriented.   Head: Atraumatic. Nose: No swelling or epistaxis Mouth/Throat: Mucous membranes are moist.   Neck:  Painless ROM, no vertebral tenderness to palpation Cardiovascular: Normal rate, regular rhythm.   Good peripheral circulation.  No chest wall tenderness palpation Respiratory: Normal respiratory effort.  No retractions. Lungs CTAB. Gastrointestinal: Soft soft, no tenderness or bruising  Musculoskeletal: Extremities warm and well perfused.  Left ankle very minimal swelling, tenderness primarily on the dorsal aspect of the ankle, no bony abnormality palpated Neurologic:  Normal speech and language. No gross focal neurologic deficits are appreciated.  Skin:  Skin is warm, dry and intact. No rash noted. Psychiatric: Mood and affect are normal. Speech and behavior are normal.  ____________________________________________   LABS (all labs ordered are listed, but only abnormal results are displayed)  Labs Reviewed - No data to display ____________________________________________  EKG  None ____________________________________________  RADIOLOGY  Ankle x-ray ____________________________________________   PROCEDURES  Procedure(s) performed: No  Procedures   Critical Care performed: No ____________________________________________   INITIAL IMPRESSION / ASSESSMENT AND PLAN / ED COURSE  Pertinent labs & imaging results that were available during my care of the patient were reviewed by me and considered in my medical decision making (see chart for details).  Patient presents after mechanical fall.  Exam is most consistent with left ankle sprain, pending x-rays.  Will treat with IM Toradol.  X-rays negative for acute fracture, Ace wrap applied to the right ankle,  recommend Naprosyn, outpatient follow-up    ____________________________________________   FINAL CLINICAL IMPRESSION(S) / ED DIAGNOSES  Final diagnoses:  Sprain of right ankle, unspecified ligament, initial encounter        Note:  This document was prepared using Dragon voice recognition  software and may include unintentional dictation errors.   Lavonia Drafts, MD 11/21/19 1016

## 2020-01-05 ENCOUNTER — Emergency Department: Payer: Medicaid Other

## 2020-01-05 ENCOUNTER — Emergency Department
Admission: EM | Admit: 2020-01-05 | Discharge: 2020-01-05 | Payer: Medicaid Other | Attending: Emergency Medicine | Admitting: Emergency Medicine

## 2020-01-05 ENCOUNTER — Encounter: Payer: Self-pay | Admitting: Emergency Medicine

## 2020-01-05 ENCOUNTER — Other Ambulatory Visit: Payer: Self-pay

## 2020-01-05 DIAGNOSIS — G40909 Epilepsy, unspecified, not intractable, without status epilepticus: Secondary | ICD-10-CM

## 2020-01-05 DIAGNOSIS — G40419 Other generalized epilepsy and epileptic syndromes, intractable, without status epilepticus: Secondary | ICD-10-CM | POA: Insufficient documentation

## 2020-01-05 DIAGNOSIS — R569 Unspecified convulsions: Secondary | ICD-10-CM

## 2020-01-05 DIAGNOSIS — F17210 Nicotine dependence, cigarettes, uncomplicated: Secondary | ICD-10-CM | POA: Insufficient documentation

## 2020-01-05 DIAGNOSIS — R4182 Altered mental status, unspecified: Secondary | ICD-10-CM | POA: Diagnosis not present

## 2020-01-05 LAB — COMPREHENSIVE METABOLIC PANEL
ALT: 10 U/L (ref 0–44)
AST: 16 U/L (ref 15–41)
Albumin: 3.9 g/dL (ref 3.5–5.0)
Alkaline Phosphatase: 127 U/L — ABNORMAL HIGH (ref 38–126)
Anion gap: 11 (ref 5–15)
BUN: 7 mg/dL (ref 6–20)
CO2: 24 mmol/L (ref 22–32)
Calcium: 9.2 mg/dL (ref 8.9–10.3)
Chloride: 100 mmol/L (ref 98–111)
Creatinine, Ser: 0.71 mg/dL (ref 0.61–1.24)
GFR calc Af Amer: >60 mL/min (ref >60)
GFR calc non Af Amer: >60 mL/min (ref >60)
Glucose, Bld: 99 mg/dL (ref 70–99)
Potassium: 4.2 mmol/L (ref 3.5–5.1)
Sodium: 135 mmol/L (ref 135–145)
Total Bilirubin: 0.8 mg/dL (ref 0.3–1.2)
Total Protein: 7.1 g/dL (ref 6.5–8.1)

## 2020-01-05 LAB — CBC WITH DIFFERENTIAL/PLATELET
Abs Immature Granulocytes: 0.03 10*3/uL (ref 0.00–0.07)
Basophils Absolute: 0 10*3/uL (ref 0.0–0.1)
Basophils Relative: 0 %
Eosinophils Absolute: 0.1 10*3/uL (ref 0.0–0.5)
Eosinophils Relative: 1 %
HCT: 44.3 % (ref 39.0–52.0)
Hemoglobin: 15.4 g/dL (ref 13.0–17.0)
Immature Granulocytes: 0 %
Lymphocytes Relative: 26 %
Lymphs Abs: 2.4 10*3/uL (ref 0.7–4.0)
MCH: 30.3 pg (ref 26.0–34.0)
MCHC: 34.8 g/dL (ref 30.0–36.0)
MCV: 87.2 fL (ref 80.0–100.0)
Monocytes Absolute: 0.9 10*3/uL (ref 0.1–1.0)
Monocytes Relative: 10 %
Neutro Abs: 5.6 10*3/uL (ref 1.7–7.7)
Neutrophils Relative %: 63 %
Platelets: 253 10*3/uL (ref 150–400)
RBC: 5.08 MIL/uL (ref 4.22–5.81)
RDW: 13.5 % (ref 11.5–15.5)
WBC: 9 10*3/uL (ref 4.0–10.5)
nRBC: 0 % (ref 0.0–0.2)

## 2020-01-05 LAB — ETHANOL: Alcohol, Ethyl (B): <10 mg/dL (ref <10)

## 2020-01-05 LAB — URINALYSIS, COMPLETE (UACMP) WITH MICROSCOPIC
Bacteria, UA: NONE SEEN
Bilirubin Urine: NEGATIVE
Glucose, UA: NEGATIVE mg/dL
Hgb urine dipstick: NEGATIVE
Ketones, ur: 5 mg/dL — AB
Leukocytes,Ua: NEGATIVE
Nitrite: NEGATIVE
Protein, ur: NEGATIVE mg/dL
Specific Gravity, Urine: 1.008 (ref 1.005–1.030)
Squamous Epithelial / HPF: NONE SEEN (ref 0–5)
pH: 6 (ref 5.0–8.0)

## 2020-01-05 LAB — URINE DRUG SCREEN, QUALITATIVE (ARMC ONLY)
Amphetamines, Ur Screen: NOT DETECTED
Barbiturates, Ur Screen: NOT DETECTED
Benzodiazepine, Ur Scrn: NOT DETECTED
Cannabinoid 50 Ng, Ur ~~LOC~~: NOT DETECTED
Cocaine Metabolite,Ur ~~LOC~~: NOT DETECTED
MDMA (Ecstasy)Ur Screen: NOT DETECTED
Methadone Scn, Ur: NOT DETECTED
Opiate, Ur Screen: NOT DETECTED
Phencyclidine (PCP) Ur S: NOT DETECTED
Tricyclic, Ur Screen: NOT DETECTED

## 2020-01-05 LAB — VALPROIC ACID LEVEL: Valproic Acid Lvl: 61 ug/mL (ref 50.0–100.0)

## 2020-01-05 MED ORDER — LACTATED RINGERS IV BOLUS
1000.0000 mL | Freq: Once | INTRAVENOUS | Status: AC
  Administered 2020-01-05: 1000 mL via INTRAVENOUS

## 2020-01-05 NOTE — ED Notes (Signed)
IVF initiated by this RN. Pt taken to radiology for CT and X-ray. Pt requesting his morning medications EDP made aware.

## 2020-01-05 NOTE — ED Triage Notes (Signed)
Pt presnets to ED via [facility] from group home with possible seizure like activity. Per EMS initially called out for possible stroke, stroke screen negative. Per staff pt ambulating differently than normal. Per EMS pt with noted incontinence on arrival. Pt with hx of seizures.   CBG 156 VSS   Upon arrival pt states was told he had a seizure by his roommate, pt noted to be lethargic upon arrival to ED.

## 2020-01-05 NOTE — ED Notes (Signed)
This RN spoke with Lucendia Herrlich from [facility] to ensure pt has assistance getting into home from cab, reports pt usually takes cab home and has no problem. Lucendia Herrlich reports group home has contract with cab company that pays for cab

## 2020-01-05 NOTE — ED Provider Notes (Signed)
[facility] Emergency Department Provider Note   ____________________________________________   First MD Initiated Contact with Patient 01/05/20 0940     (approximate)  I have reviewed the triage vital signs and the nursing notes.   HISTORY  Chief Complaint Altered Mental Status    HPI Russell Buchanan is a 44 y.o. male with past medical history of schizophrenia, bipolar disorder, and seizures who presents to the ED for altered mental status.  History is limited due to patient's altered mental status.  He states that his roommate at the group home told him he had a seizure. He reports a history of seizures and states he has been taking his medication as prescribed, although he is not sure when he takes.  He thinks he fell to the ground, but is not sure whether he hit his head, now states he feels "bad" with generalized weakness.  He denies any recent fevers, cough, chest pain, shortness of breath, abdominal pain, vomiting, diarrhea, or dysuria.  EMS did note that he was incontinent of urine and staff at group home was concerned that he was not acting like his usual self, slower to respond than usual.        Past Medical History:  Diagnosis Date  . Antisocial personality disorder (HCC)   . Bipolar disorder (HCC)   . Environmental and seasonal allergies   . GERD (gastroesophageal reflux disease)   . Headache   . Hyperlipidemia   . Infected elbow (HCC)   . Schizophrenia (HCC)   . Seizures (HCC)   . Tremors of nervous system     Patient Active Problem List   Diagnosis Date Noted  . AMS (altered mental status) 06/25/2019  . Bipolar disorder (HCC)   . Antisocial personality disorder (HCC)   . Acute encephalopathy 12/04/2016  . GERD (gastroesophageal reflux disease) 12/04/2016  . HLD (hyperlipidemia) 12/04/2016  . Schizophrenia (HCC) 11/15/2016  . Medication side effects 11/15/2016    Past Surgical History:  Procedure Laterality Date  . CLOSED  REDUCTION WRIST FRACTURE Left   . HARDWARE REMOVAL Right 04/14/2018   Procedure: HARDWARE REMOVAL-RIGHT ELBOW;  Surgeon: Deeann Saint, MD;  Location: [facility] ORS;  Service: Orthopedics;  Laterality: Right;  . ORIF ELBOW FRACTURE Right   . SCALP LACERATION REPAIR    . WRIST RECONSTRUCTION Left     Prior to Admission medications   Medication Sig Start Date End Date Taking? Authorizing Provider  albuterol (PROVENTIL HFA;VENTOLIN HFA) 108 (90 Base) MCG/ACT inhaler Inhale 2 puffs into the lungs every 6 (six) hours as needed for wheezing or shortness of breath. 01/30/17   Merrily Brittle, MD  benztropine (COGENTIN) 1 MG tablet Take 1 mg by mouth 2 (two) times daily as needed for tremors.     [provider]  Butalbital-APAP-Caffeine 50-325-40 MG per capsule Take 1 capsule by mouth 2 (two) times daily as needed for headache.     [provider]  cetirizine (ZYRTEC) 10 MG tablet Take 10 mg by mouth daily.    [provider]  clonazePAM (KLONOPIN) 1 MG tablet Take 1 mg by mouth 2 (two) times daily as needed for anxiety.    [provider]  clotrimazole-betamethasone (LOTRISONE) cream Apply 1 application topically 3 (three) times daily.    [provider]  divalproex (DEPAKOTE) 500 MG DR tablet Take 1 tablet (500 mg total) by mouth every morning. 06/27/19 06/26/20  Darlin Priestly, MD  divalproex (DEPAKOTE) 500 MG DR tablet Take 2 tablets (1,000 mg total)  by mouth at bedtime. 06/27/19   Darlin Priestly, MD  docusate sodium (COLACE) 100 MG capsule Take 100 mg by mouth daily.    [provider]  EPINEPHrine 0.3 mg/0.3 mL IJ SOAJ injection Inject 0.3 mg into the muscle as needed (allergic reactions).     [provider]  escitalopram (LEXAPRO) 20 MG tablet Take 20 mg by mouth daily.    [provider]  Fremanezumab-vfrm (AJOVY) 225 MG/1.5ML SOSY Inject into the skin every 28 (twenty-eight) days.    [provider]  furosemide (LASIX) 40 MG  tablet Take 1 tablet (40 mg total) by mouth daily for 30 days. 06/25/18 06/25/19  Minna Antis, MD  gabapentin (NEURONTIN) 400 MG capsule Take 1 capsule (400 mg total) by mouth 3 (three) times daily. 04/14/18   Deeann Saint, MD  gemfibrozil (LOPID) 600 MG tablet Take 600 mg by mouth 2 (two) times daily before a meal.    [provider]  haloperidol (HALDOL) 10 MG tablet Take 10 mg by mouth 2 (two) times daily as needed (agitation).     [provider]  ketoconazole (NIZORAL) 2 % cream Apply 1 application topically 2 (two) times daily.    [provider]  linaclotide (LINZESS) 72 MCG capsule Take 72 mcg by mouth daily.     [provider]  lipase/protease/amylase (CREON) 36000 UNITS CPEP capsule Take 36,000 Units by mouth 3 (three) times daily with meals. 2 caps withmeals and 1 with snacks    [provider]  lipase/protease/amylase (CREON) 36000 UNITS CPEP capsule Take 36,000 Units by mouth. With snacks    [provider]  LORazepam (ATIVAN) 1 MG tablet Take 1 mg by mouth every 8 (eight) hours as needed for anxiety.    [provider]  magnesium gluconate (MAGONATE) 500 MG tablet Take 500 mg by mouth daily.    [provider]  meloxicam (MOBIC) 15 MG tablet Take 1 tablet (15 mg total) by mouth daily as needed for pain. 06/27/19   Darlin Priestly, MD  metoprolol tartrate (LOPRESSOR) 50 MG tablet Take 50 mg by mouth daily.    [provider]  naproxen (NAPROSYN) 500 MG tablet Take 1 tablet (500 mg total) by mouth 2 (two) times daily with a meal. 11/21/19   Jene Every, MD  nystatin cream (MYCOSTATIN) Apply 1 application topically 2 (two) times daily as needed for dry skin.     [provider]  omeprazole (PRILOSEC) 40 MG capsule Take 40 mg by mouth 2 (two) times daily before a meal. 04/16/19   [provider]  ondansetron (ZOFRAN) 8 MG tablet Take 8 mg by mouth every 8 (eight) hours as needed for nausea.  06/16/19   [provider]  Paliperidone Palmitate ER (INVEGA TRINZA) 819 MG/2.625ML SUSY Inject into the muscle every 3 (three) months.    [provider]  polyethylene glycol (MIRALAX / GLYCOLAX) packet Take 17 g by mouth daily.    [provider]  pravastatin (PRAVACHOL) 40 MG tablet Take 40 mg by mouth every evening. 06/24/19   [provider]  propranolol (INNOPRAN XL) 80 MG 24 hr capsule Don't need this if taking Lopressor. 06/27/19   Darlin Priestly, MD  propranolol ER (INDERAL LA) 80 MG 24 hr capsule Don't need this if taking Lopressor. 06/27/19   Darlin Priestly, MD  QUEtiapine (SEROQUEL) 50 MG tablet Take 75 mg by mouth at bedtime. 06/24/19   [provider]  simethicone (MYLICON) 80 MG chewable tablet Chew  80 mg by mouth 2 (two) times daily.    [provider]  sucralfate (CARAFATE) 1 g tablet Take 1 tablet (1 g total) by mouth 4 (four) times daily -  with meals and at bedtime. 05/13/18   Willy Eddy, MD  SUMAtriptan (IMITREX) 100 MG tablet Take 100 mg by mouth every 2 (two) hours as needed for migraine. May repeat in 2 hours if headache persists or recurs.    [provider]  tiotropium (SPIRIVA) 18 MCG inhalation capsule Don't need this if taking Stiolto. 06/27/19   Darlin Priestly, MD  Tiotropium Bromide-Olodaterol (STIOLTO RESPIMAT IN) Inhale 2 puffs into the lungs daily.    [provider]    Allergies Bee venom  Family History  Family history unknown: Yes    Social History Social History   Tobacco Use  . Smoking status: Current Every Day Smoker    Packs/day: 0.50    Years: 7.00    Pack years: 3.50    Types: Cigarettes  . Smokeless tobacco: Former Neurosurgeon    Types: Chew, Snuff  Vaping Use  . Vaping Use: Never used  Substance Use Topics  . Alcohol use: No  . Drug use: Not Currently    Review of Systems  Constitutional: No fever/chills.  Positive for fatigue and generalized weakness. Eyes: No visual  changes. ENT: No sore throat. Cardiovascular: Denies chest pain. Respiratory: Denies shortness of breath. Gastrointestinal: No abdominal pain.  No nausea, no vomiting.  No diarrhea.  No constipation. Genitourinary: Negative for dysuria. Musculoskeletal: Negative for back pain. Skin: Negative for rash. Neurological: Negative for headaches, focal weakness or numbness.  Positive for seizure.  ____________________________________________   PHYSICAL EXAM:  VITAL SIGNS: ED Triage Vitals  Enc Vitals Group     BP 01/05/20 0936 118/82     Pulse Rate 01/05/20 0936 63     Resp 01/05/20 0936 17     Temp 01/05/20 0936 98.3 F (36.8 C)     Temp Source 01/05/20 0936 Oral     SpO2 01/05/20 0936 100 %     Weight 01/05/20 0934 229 lb 4.5 oz (104 kg)     Height 01/05/20 0934 6\' 1"  (1.854 m)     Head Circumference --      Peak Flow --      Pain Score 01/05/20 0934 0     Pain Loc --      Pain Edu? --      Excl. in GC? --     Constitutional: Alert and oriented. Eyes: Conjunctivae are normal. Head: Atraumatic. Nose: No congestion/rhinnorhea. Mouth/Throat: Mucous membranes are moist. Neck: Normal ROM Cardiovascular: Normal rate, regular rhythm. Grossly normal heart sounds. Respiratory: Normal respiratory effort.  No retractions. Lungs CTAB. Gastrointestinal: Soft and nontender. No distention. Genitourinary: deferred Musculoskeletal: No lower extremity tenderness nor edema. Neurologic:  Normal speech and language.  Global weakness with no gross focal neurologic deficits appreciated. Skin:  Skin is warm, dry and intact. No rash noted. Psychiatric: Mood and affect are normal. Speech and behavior are normal.  ____________________________________________   LABS (all labs ordered are listed, but only abnormal results are displayed)  Labs Reviewed  URINALYSIS, COMPLETE (UACMP) WITH MICROSCOPIC - Abnormal; Notable for the following components:      Result Value   Color, Urine YELLOW (*)     APPearance CLEAR (*)    Ketones, ur 5 (*)    All other components within normal limits  COMPREHENSIVE METABOLIC PANEL - Abnormal; Notable for the following  components:   Alkaline Phosphatase 127 (*)    All other components within normal limits  CBC WITH DIFFERENTIAL/PLATELET  VALPROIC ACID LEVEL  ETHANOL  URINE DRUG SCREEN, QUALITATIVE ([facility] ONLY)   ____________________________________________  EKG  ED ECG REPORT I, Chesley Noon, the attending physician, personally viewed and interpreted this ECG.   Date: 01/05/2020  EKG Time: 9:35  Rate: 62  Rhythm: normal sinus rhythm  Axis: Normal  Intervals:none  ST&T Change: None   PROCEDURES  Procedure(s) performed (including Critical Care):  Procedures   ____________________________________________   INITIAL IMPRESSION / ASSESSMENT AND PLAN / ED COURSE       44 year old male with past medical history of schizophrenia, bipolar disorder, and seizure disorder who presents to the ED for generalized weakness and slowness to respond for which staff at his group home called EMS.  Patient reports that he had a seizure witnessed by his roommate, although he is a poor historian.  He states he is feeling generally weak, but is unclear whether this predated any seizure activity.  Vital signs are reassuring and do not suggest sepsis but we will screen for infectious process.  Given questionable head trauma, we will also check head CT.  Differential also includes electrolyte abnormality, Depakote toxicity, or encephalopathy related to his multiple psychotropic medications.  We will hydrate with IV fluids and observe pending results.  CT head negative for acute process, lab work unremarkable and Depakote level is therapeutic. Patient has had gradual improvement in his condition, is no longer feeling weak and states he feels back to normal. He has no focal deficits on reexamination with strength intact in all 4 extremities. I suspect his initial  generalized weakness was due to postictal state given reported seizure activity. He has had no further seizure activity here in the ED and is appropriate for discharge home, was counseled to follow-up with his neurologist. Patient counseled to return to the ED for new or worsening symptoms, patient agrees with plan.      ____________________________________________   FINAL CLINICAL IMPRESSION(S) / ED DIAGNOSES  Final diagnoses:  Seizure disorder (HCC)  Post-ictal state [facility])     ED Discharge Orders    None       Note:  This document was prepared using Dragon voice recognition software and may include unintentional dictation errors.   Chesley Noon, MD 01/05/20 1318

## 2020-01-05 NOTE — ED Notes (Signed)
Pt signed paper copy of d/c instructions, denies questions concerns.

## 2020-01-05 NOTE — ED Notes (Signed)
Pt assisted with ambulation to toilet to provide urine sample

## 2020-01-05 NOTE — ED Notes (Signed)
Pt returned from radiology at this time. Pt requesting something to eat, this RN explained nothing to eat/drink at this time. Pt given urinal. Lights dimmed for patient comfort. Call bell within reach at this time.

## 2020-01-10 ENCOUNTER — Observation Stay
Admission: EM | Admit: 2020-01-10 | Discharge: 2020-01-11 | Disposition: A | Discharge status: Home / Self Care | Payer: Medicaid Other | Attending: Internal Medicine | Admitting: Internal Medicine

## 2020-01-10 ENCOUNTER — Other Ambulatory Visit: Payer: Self-pay

## 2020-01-10 DIAGNOSIS — E785 Hyperlipidemia, unspecified: Secondary | ICD-10-CM | POA: Insufficient documentation

## 2020-01-10 DIAGNOSIS — F209 Schizophrenia, unspecified: Secondary | ICD-10-CM | POA: Diagnosis not present

## 2020-01-10 DIAGNOSIS — K219 Gastro-esophageal reflux disease without esophagitis: Secondary | ICD-10-CM | POA: Diagnosis not present

## 2020-01-10 DIAGNOSIS — F17210 Nicotine dependence, cigarettes, uncomplicated: Secondary | ICD-10-CM | POA: Insufficient documentation

## 2020-01-10 DIAGNOSIS — Z20822 Contact with and (suspected) exposure to covid-19: Secondary | ICD-10-CM | POA: Diagnosis not present

## 2020-01-10 DIAGNOSIS — Z79899 Other long term (current) drug therapy: Secondary | ICD-10-CM | POA: Diagnosis not present

## 2020-01-10 DIAGNOSIS — R569 Unspecified convulsions: Secondary | ICD-10-CM | POA: Diagnosis not present

## 2020-01-10 DIAGNOSIS — F319 Bipolar disorder, unspecified: Secondary | ICD-10-CM | POA: Diagnosis not present

## 2020-01-10 LAB — CBC
HCT: 43 % (ref 39.0–52.0)
Hemoglobin: 14.8 g/dL (ref 13.0–17.0)
MCH: 30.1 pg (ref 26.0–34.0)
MCHC: 34.4 g/dL (ref 30.0–36.0)
MCV: 87.6 fL (ref 80.0–100.0)
Platelets: 240 10*3/uL (ref 150–400)
RBC: 4.91 MIL/uL (ref 4.22–5.81)
RDW: 13.6 % (ref 11.5–15.5)
WBC: 6.3 10*3/uL (ref 4.0–10.5)
nRBC: 0 % (ref 0.0–0.2)

## 2020-01-10 LAB — COMPREHENSIVE METABOLIC PANEL
ALT: 14 U/L (ref 0–44)
AST: 21 U/L (ref 15–41)
Albumin: 3.9 g/dL (ref 3.5–5.0)
Alkaline Phosphatase: 135 U/L — ABNORMAL HIGH (ref 38–126)
Anion gap: 13 (ref 5–15)
BUN: 11 mg/dL (ref 6–20)
CO2: 24 mmol/L (ref 22–32)
Calcium: 9.3 mg/dL (ref 8.9–10.3)
Chloride: 100 mmol/L (ref 98–111)
Creatinine, Ser: 0.67 mg/dL (ref 0.61–1.24)
GFR calc Af Amer: >60 mL/min (ref >60)
GFR calc non Af Amer: >60 mL/min (ref >60)
Glucose, Bld: 101 mg/dL — ABNORMAL HIGH (ref 70–99)
Potassium: 4.3 mmol/L (ref 3.5–5.1)
Sodium: 137 mmol/L (ref 135–145)
Total Bilirubin: 0.8 mg/dL (ref 0.3–1.2)
Total Protein: 7.2 g/dL (ref 6.5–8.1)

## 2020-01-10 LAB — VALPROIC ACID LEVEL: Valproic Acid Lvl: 77 ug/mL (ref 50.0–100.0)

## 2020-01-10 LAB — GLUCOSE, CAPILLARY: Glucose-Capillary: 98 mg/dL (ref 70–99)

## 2020-01-10 LAB — CK: Total CK: 514 U/L — ABNORMAL HIGH (ref 49–397)

## 2020-01-10 LAB — SARS CORONAVIRUS 2 BY RT PCR (HOSPITAL ORDER, PERFORMED IN ~~LOC~~ HOSPITAL LAB): SARS Coronavirus 2: NEGATIVE

## 2020-01-10 LAB — MAGNESIUM: Magnesium: 2.1 mg/dL (ref 1.7–2.4)

## 2020-01-10 MED ORDER — PANCRELIPASE (LIP-PROT-AMYL) 12000-38000 UNITS PO CPEP
36000.0000 [IU] | ORAL_CAPSULE | Freq: Three times a day (TID) | ORAL | Status: DC
  Administered 2020-01-11 (×3): 36000 [IU] via ORAL
  Filled 2020-01-10 (×4): qty 3

## 2020-01-10 MED ORDER — QUETIAPINE FUMARATE 25 MG PO TABS: 100.0000 mg | ORAL_TABLET | Freq: Every day | ORAL | Status: DC

## 2020-01-10 MED ORDER — GABAPENTIN 600 MG PO TABS
600.0000 mg | ORAL_TABLET | Freq: Three times a day (TID) | ORAL | Status: DC
  Administered 2020-01-11 (×2): 600 mg via ORAL
  Filled 2020-01-10 (×2): qty 1

## 2020-01-10 MED ORDER — ENOXAPARIN SODIUM 40 MG/0.4ML ~~LOC~~ SOLN
40.0000 mg | SUBCUTANEOUS | Status: DC
  Administered 2020-01-10: 23:00:00 40 mg via SUBCUTANEOUS
  Filled 2020-01-10: qty 0.4

## 2020-01-10 MED ORDER — ALBUTEROL SULFATE (2.5 MG/3ML) 0.083% IN NEBU: 3.0000 mL | INHALATION_SOLUTION | Freq: Four times a day (QID) | RESPIRATORY_TRACT | Status: DC | PRN

## 2020-01-10 MED ORDER — ESCITALOPRAM OXALATE 10 MG PO TABS
20.0000 mg | ORAL_TABLET | Freq: Every day | ORAL | Status: DC
  Administered 2020-01-11: 20 mg via ORAL
  Filled 2020-01-10: qty 2

## 2020-01-10 MED ORDER — SODIUM CHLORIDE 0.9 % IV SOLN
100.0000 mg | INTRAVENOUS | Status: AC
  Administered 2020-01-10: 100 mg via INTRAVENOUS
  Filled 2020-01-10: qty 10

## 2020-01-10 MED ORDER — PRAVASTATIN SODIUM 40 MG PO TABS
40.0000 mg | ORAL_TABLET | Freq: Every evening | ORAL | Status: DC
  Administered 2020-01-11: 17:00:00 40 mg via ORAL
  Filled 2020-01-10: qty 1
  Filled 2020-01-10: qty 2

## 2020-01-10 MED ORDER — GEMFIBROZIL 600 MG PO TABS
600.0000 mg | ORAL_TABLET | Freq: Two times a day (BID) | ORAL | Status: DC
  Administered 2020-01-11 (×2): 600 mg via ORAL
  Filled 2020-01-10 (×3): qty 1

## 2020-01-10 MED ORDER — SODIUM CHLORIDE 0.9 % IV SOLN: INTRAVENOUS | Status: AC

## 2020-01-10 MED ORDER — ACETAMINOPHEN 650 MG RE SUPP: 650.0000 mg | RECTAL | Status: DC | PRN

## 2020-01-10 MED ORDER — LINACLOTIDE 72 MCG PO CAPS
72.0000 ug | ORAL_CAPSULE | Freq: Every day | ORAL | Status: DC
  Administered 2020-01-11: 09:00:00 72 ug via ORAL
  Filled 2020-01-10: qty 1

## 2020-01-10 MED ORDER — LACOSAMIDE 200 MG PO TABS
100.0000 mg | ORAL_TABLET | Freq: Two times a day (BID) | ORAL | Status: DC
  Administered 2020-01-11: 09:00:00 100 mg via ORAL
  Filled 2020-01-10: qty 1

## 2020-01-10 MED ORDER — LACOSAMIDE 200 MG PO TABS: 100.0000 mg | ORAL_TABLET | Freq: Two times a day (BID) | ORAL | Status: DC

## 2020-01-10 MED ORDER — PROPRANOLOL HCL ER 80 MG PO CP24
160.0000 mg | ORAL_CAPSULE | Freq: Every day | ORAL | Status: DC
  Administered 2020-01-11: 09:00:00 160 mg via ORAL
  Filled 2020-01-10: qty 2

## 2020-01-10 MED ORDER — ACETAMINOPHEN 325 MG PO TABS: 650.0000 mg | ORAL_TABLET | ORAL | Status: DC | PRN

## 2020-01-10 MED ORDER — DIVALPROEX SODIUM 500 MG PO DR TAB
1000.0000 mg | DELAYED_RELEASE_TABLET | Freq: Every day | ORAL | Status: DC
  Filled 2020-01-10 (×2): qty 2

## 2020-01-10 MED ORDER — SENNOSIDES-DOCUSATE SODIUM 8.6-50 MG PO TABS: 1.0000 | ORAL_TABLET | Freq: Every evening | ORAL | Status: DC | PRN

## 2020-01-10 MED ORDER — PANTOPRAZOLE SODIUM 40 MG PO TBEC
80.0000 mg | DELAYED_RELEASE_TABLET | Freq: Every day | ORAL | Status: DC
  Administered 2020-01-11: 09:00:00 80 mg via ORAL
  Filled 2020-01-10: qty 2

## 2020-01-10 MED ORDER — DIVALPROEX SODIUM 500 MG PO DR TAB
500.0000 mg | DELAYED_RELEASE_TABLET | Freq: Every morning | ORAL | Status: DC
  Administered 2020-01-11: 09:00:00 500 mg via ORAL
  Filled 2020-01-10: qty 1

## 2020-01-10 MED ORDER — TIOTROPIUM BROMIDE-OLODATEROL 2.5-2.5 MCG/ACT IN AERS: INHALATION_SPRAY | Freq: Every day | RESPIRATORY_TRACT | Status: DC

## 2020-01-10 MED ORDER — LORAZEPAM 2 MG/ML IJ SOLN: 1.0000 mg | INTRAMUSCULAR | Status: DC | PRN

## 2020-01-10 NOTE — ED Triage Notes (Signed)
Pt arrives via [facility] from [facility] with c/o seizures. Pt was here 3 days ago for the same but has not followed up with his neurologist yet. Pt has hx of seizure disorder since childhood. Per facility staff, pt has had an increase in his seizure for the past year but most noticeably the past few days. Pt also appears weaker than usual and has been complaining of dizziness. Pt lethargic on arrival.   EMS VSS- cbg 96, bp 125/77, hr 66, o2 sats 97% RA.

## 2020-01-10 NOTE — ED Notes (Signed)
Seizure pads placed on sizerails for patient safety. Suction available at bedside.

## 2020-01-10 NOTE — H&P (Signed)
History and Physical    Russell Buchanan UEA:[id number] DOB: 06-06-1975 DOA: 01/10/2020  PCP: Armando Gang, FNP  [facility] from: [facility]  I have personally briefly reviewed patient's old medical records in [facility]  Chief Complaint: Breakthrough seizures  HPI: Russell Buchanan is a 44 y.o. male with medical history significant for schizophrenia/bipolar disorder, seizure disorder, migraines, hyperlipidemia, chronic nausea and constipation, GERD, and tobacco use who presents to the ED for evaluation of seizure disorder.  Patient recently seen in the ED on 01/05/2020 for generalized weakness suspected due to postictal state from seizure.  He was provided supportive care with IV fluids.  CT head without contrast was the normal study.  Valproic acid level was therapeutic at 61.  He was discharged back to his group home with advice to follow-up with his neurologist.  Patient states that he has had recurrent seizures beginning this morning (01/10/2020), described as generalized weakness, lethargy, slow and slurred speech.  He says he had another spell this afternoon and EMS services were called and he was brought to the ED for further evaluation.  He reports having continued generalized weakness and some discomfort in his left axillary region.  He feels that his speech is still slow.  He said he had some shortness of breath earlier which has since improved.  He denies any subjective fevers, chills, chest pain, abdominal pain, nausea, vomiting, dysuria, diarrhea, or constipation.  He says he has been able to take his home medications today.  ED Course:  Initial vitals showed BP 125/81, pulse 65, RR 16, temp 98.8 Fahrenheit, SPO2 94% on room air.  Labs are notable for sodium 137, potassium 4.3, bicarb 24, BUN 11, creatinine 0.67, LFTs within normal limits, serum glucose 101, WBC 6.3, hemoglobin 14.8, platelets 240,000.  Valproate level was obtained and pending.  SARS-CoV-2 PCR is  ordered and pending.  EDP discussed with on-call neurology who recommended loading with IV Vimpat and observing overnight for further seizure activity.  The hospitalist service was consulted to admit for further evaluation and management.  Review of Systems: All systems reviewed and are negative except as documented in history of present illness above.   Past Medical History:  Diagnosis Date  . Antisocial personality disorder (HCC)   . Bipolar disorder (HCC)   . Environmental and seasonal allergies   . GERD (gastroesophageal reflux disease)   . Headache   . Hyperlipidemia   . Infected elbow (HCC)   . Schizophrenia (HCC)   . Seizures (HCC)   . Tremors of nervous system     Past Surgical History:  Procedure Laterality Date  . CLOSED REDUCTION WRIST FRACTURE Left   . HARDWARE REMOVAL Right 04/14/2018   Procedure: HARDWARE REMOVAL-RIGHT ELBOW;  Surgeon: Deeann Saint, MD;  Location: [facility] ORS;  Service: Orthopedics;  Laterality: Right;  . ORIF ELBOW FRACTURE Right   . SCALP LACERATION REPAIR    . WRIST RECONSTRUCTION Left     Social History:  reports that he has been smoking cigarettes. He has a 3.50 pack-year smoking history. He has quit using smokeless tobacco.  His smokeless tobacco use included chew and snuff. He reports previous drug use. He reports that he does not drink alcohol.  Allergies  Allergen Reactions  . Bee Venom Hives    Family History  Family history unknown: Yes     Prior to Admission medications   Medication Sig Start Date End Date Taking? Authorizing Provider  albuterol (PROVENTIL HFA;VENTOLIN HFA) 108 (90 Base) MCG/ACT  inhaler Inhale 2 puffs into the lungs every 6 (six) hours as needed for wheezing or shortness of breath. 01/30/17   Merrily Brittle, MD  benztropine (COGENTIN) 1 MG tablet Take 1 mg by mouth 2 (two) times daily as needed for tremors.     [provider]  Butalbital-APAP-Caffeine 50-325-40 MG per capsule Take 1 capsule by mouth 2  (two) times daily as needed for headache.     [provider]  cetirizine (ZYRTEC) 10 MG tablet Take 10 mg by mouth daily.    [provider]  clonazePAM (KLONOPIN) 1 MG tablet Take 1 mg by mouth 2 (two) times daily as needed for anxiety.    [provider]  clotrimazole-betamethasone (LOTRISONE) cream Apply 1 application topically 3 (three) times daily.    [provider]  divalproex (DEPAKOTE) 500 MG DR tablet Take 1 tablet (500 mg total) by mouth every morning. 06/27/19 06/26/20  Darlin Priestly, MD  divalproex (DEPAKOTE) 500 MG DR tablet Take 2 tablets (1,000 mg total) by mouth at bedtime. 06/27/19   Darlin Priestly, MD  docusate sodium (COLACE) 100 MG capsule Take 100 mg by mouth daily.    [provider]  EPINEPHrine 0.3 mg/0.3 mL IJ SOAJ injection Inject 0.3 mg into the muscle as needed (allergic reactions).     [provider]  escitalopram (LEXAPRO) 20 MG tablet Take 20 mg by mouth daily.    [provider]  Fremanezumab-vfrm (AJOVY) 225 MG/1.5ML SOSY Inject into the skin every 28 (twenty-eight) days.    [provider]  furosemide (LASIX) 40 MG tablet Take 1 tablet (40 mg total) by mouth daily for 30 days. 06/25/18 06/25/19  Minna Antis, MD  gabapentin (NEURONTIN) 400 MG capsule Take 1 capsule (400 mg total) by mouth 3 (three) times daily. 04/14/18   Deeann Saint, MD  gemfibrozil (LOPID) 600 MG tablet Take 600 mg by mouth 2 (two) times daily before a meal.    [provider]  haloperidol (HALDOL) 10 MG tablet Take 10 mg by mouth 2 (two) times daily as needed (agitation).     [provider]  ketoconazole (NIZORAL) 2 % cream Apply 1 application topically 2 (two) times daily.    [provider]  linaclotide (LINZESS) 72 MCG capsule Take 72 mcg by mouth daily.     [provider]  lipase/protease/amylase (CREON) 36000 UNITS CPEP capsule Take 36,000 Units by mouth 3 (three) times daily with  meals. 2 caps withmeals and 1 with snacks    [provider]  lipase/protease/amylase (CREON) 36000 UNITS CPEP capsule Take 36,000 Units by mouth. With snacks    [provider]  LORazepam (ATIVAN) 1 MG tablet Take 1 mg by mouth every 8 (eight) hours as needed for anxiety.    [provider]  magnesium gluconate (MAGONATE) 500 MG tablet Take 500 mg by mouth daily.    [provider]  meloxicam (MOBIC) 15 MG tablet Take 1 tablet (15 mg total) by mouth daily as needed for pain. 06/27/19   Darlin Priestly, MD  metoprolol tartrate (LOPRESSOR) 50 MG tablet Take 50 mg by mouth daily.    [provider]  naproxen (NAPROSYN) 500 MG tablet Take 1 tablet (500 mg total) by mouth 2 (two) times daily with a meal. 11/21/19   Jene Every, MD  nystatin cream (MYCOSTATIN) Apply 1 application topically 2 (two) times daily as needed for dry skin.     [provider]  omeprazole (PRILOSEC) 40 MG capsule  Take 40 mg by mouth 2 (two) times daily before a meal. 04/16/19   [provider]  ondansetron (ZOFRAN) 8 MG tablet Take 8 mg by mouth every 8 (eight) hours as needed for nausea. 06/16/19   [provider]  Paliperidone Palmitate ER (INVEGA TRINZA) 819 MG/2.625ML SUSY Inject into the muscle every 3 (three) months.    [provider]  polyethylene glycol (MIRALAX / GLYCOLAX) packet Take 17 g by mouth daily.    [provider]  pravastatin (PRAVACHOL) 40 MG tablet Take 40 mg by mouth every evening. 06/24/19   [provider]  QUEtiapine (SEROQUEL) 50 MG tablet Take 75 mg by mouth at bedtime. 06/24/19   [provider]  simethicone (MYLICON) 80 MG chewable tablet Chew 80 mg by mouth 2 (two) times daily.    [provider]  sucralfate (CARAFATE) 1 g tablet Take 1 tablet (1 g total) by mouth 4 (four) times daily -  with meals and at bedtime. 05/13/18   Willy Eddy, MD  SUMAtriptan (IMITREX) 100 MG tablet Take 100  mg by mouth every 2 (two) hours as needed for migraine. May repeat in 2 hours if headache persists or recurs.    [provider]  tiotropium (SPIRIVA) 18 MCG inhalation capsule Don't need this if taking Stiolto. 06/27/19   Darlin Priestly, MD  Tiotropium Bromide-Olodaterol (STIOLTO RESPIMAT IN) Inhale 2 puffs into the lungs daily.    [provider]    Physical Exam: Vitals:   01/10/20 1843 01/10/20 1844 01/10/20 1904 01/10/20 1928  BP: 125/81   127/81  Pulse:  63  63  Resp: (!) 23 16  (!) 22  Temp:   98.8 F (37.1 C)   TempSrc:   Oral   SpO2:  94%  94%  Weight: 104 kg     Height: 6\' 1"  (1.854 m)      Constitutional: Resting supine in bed, lethargic but in NAD, calm, comfortable Eyes: PERRL, lids and conjunctivae normal ENMT: Mucous membranes are dry. Posterior pharynx clear of any exudate or lesions.Normal dentition.  Neck: normal, supple, no masses. Respiratory: clear to auscultation anteriorly. Normal respiratory effort. No accessory muscle use.  Cardiovascular: Regular rate and rhythm, no murmurs / rubs / gallops. No extremity edema. 2+ pedal pulses. Abdomen: no tenderness, no masses palpated. No hepatosplenomegaly. Bowel sounds positive.  Musculoskeletal: no clubbing / cyanosis. No joint deformity upper and lower extremities. Good ROM, no contractures. Normal muscle tone.  Skin: Diaphoretic, no rashes, lesions, ulcers. No induration Neurologic: Somewhat slow speech otherwise CN 2-12 grossly intact. Sensation intact, generalized weakness otherwise strength 5/5 in all 4 with easy fatigability.  Psychiatric: Alert and oriented x 3.  Lethargic.  Labs on Admission: I have personally reviewed following labs and imaging studies  CBC: Recent Labs  Lab 01/05/20 0936 01/10/20 1900  WBC 9.0 6.3  NEUTROABS 5.6  --   HGB 15.4 14.8  HCT 44.3 43.0  MCV 87.2 87.6  PLT 253 240   Basic Metabolic Panel: Recent Labs  Lab 01/05/20 0936 01/10/20 1900  NA 135 137  K 4.2 4.3   CL 100 100  CO2 24 24  GLUCOSE 99 101*  BUN 7 11  CREATININE 0.71 0.67  CALCIUM 9.2 9.3   GFR: Estimated Creatinine Clearance: 150.7 mL/min (by C-G formula based on SCr of 0.67 mg/dL). Liver Function Tests: Recent Labs  Lab 01/05/20 0936 01/10/20 1900  AST 16 21  ALT 10 14  ALKPHOS 127* 135*  BILITOT  0.8 0.8  PROT 7.1 7.2  ALBUMIN 3.9 3.9   No results for input(s): LIPASE, AMYLASE in the last 168 hours. No results for input(s): AMMONIA in the last 168 hours. Coagulation Profile: No results for input(s): INR, PROTIME in the last 168 hours. Cardiac Enzymes: No results for input(s): CKTOTAL, CKMB, CKMBINDEX, TROPONINI in the last 168 hours. BNP (last 3 results) No results for input(s): PROBNP in the last 8760 hours. HbA1C: No results for input(s): HGBA1C in the last 72 hours. CBG: Recent Labs  Lab 01/10/20 1941  GLUCAP 98   Lipid Profile: No results for input(s): CHOL, HDL, LDLCALC, TRIG, CHOLHDL, LDLDIRECT in the last 72 hours. Thyroid Function Tests: No results for input(s): TSH, T4TOTAL, FREET4, T3FREE, THYROIDAB in the last 72 hours. Anemia Panel: No results for input(s): VITAMINB12, FOLATE, FERRITIN, TIBC, IRON, RETICCTPCT in the last 72 hours. Urine analysis:    Component Value Date/Time   COLORURINE YELLOW (A) 01/05/2020 1154   APPEARANCEUR CLEAR (A) 01/05/2020 1154   APPEARANCEUR Clear 02/27/2014 2152   LABSPEC 1.008 01/05/2020 1154   LABSPEC 1.004 02/27/2014 2152   PHURINE 6.0 01/05/2020 1154   GLUCOSEU NEGATIVE 01/05/2020 1154   GLUCOSEU Negative 02/27/2014 2152   HGBUR NEGATIVE 01/05/2020 1154   BILIRUBINUR NEGATIVE 01/05/2020 1154   BILIRUBINUR Negative 02/27/2014 2152   KETONESUR 5 (A) 01/05/2020 1154   PROTEINUR NEGATIVE 01/05/2020 1154   NITRITE NEGATIVE 01/05/2020 1154   LEUKOCYTESUR NEGATIVE 01/05/2020 1154   LEUKOCYTESUR Negative 02/27/2014 2152    Radiological Exams on Admission: No results found.  EKG: Independently reviewed. Sinus  rhythm without acute ischemic changes.  Assessment/Plan Principal Problem:   Seizure (HCC) Active Problems:   Schizophrenia (HCC)   GERD (gastroesophageal reflux disease)   HLD (hyperlipidemia)   Bipolar disorder (HCC)  Russell Buchanan is a 44 y.o. male with medical history significant for schizophrenia/bipolar disorder, seizure disorder, migraines, hyperlipidemia, chronic nausea and constipation, GERD, and tobacco use who is admitted after seizure activity.  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  Appears to be in postictal state at time of admission.  EDP discussed with on-call neurology who recommended loading with IV Vimpat with continued observation overnight. -Receiving IV Vimpat 100 mg load -Resume home regimen of Depakote 500 mg a.m. and 1000 mg nightly, gabapentin 600 mg 3 times daily, Vimpat 100 mg twice daily tomorrow -IV Ativan if needed for breakthrough seizure -Obtain EEG -Check CK, magnesium level, follow-up Depakote level -Start IV fluid hydration overnight -Monitor on telemetry, continue seizure precautions  Schizophrenia/bipolar disorder: Appears postictal with otherwise stable mood.  Resume home Lexapro, Seroquel.  Hyperlipidemia: Continue pravastatin and gemfibrozil.  GERD Chronic nausea Chronic constipation: Resume home PPI, Linzess, Creon.  Drug-induced tremors: Continue propranolol.  DVT prophylaxis: Lovenox Code Status: Full code Family Communication: Discussed with patient, he states he has no family.  He says group home staff are aware. Disposition Plan: Likely discharge to prior environment Consults called: EDP discussed with on-call neurology Admission status:  Status is: Observation  The patient remains OBS appropriate and will d/c before 2 midnights.  Dispo: The patient is from: Group home              Anticipated d/c is to: Group home               Anticipated d/c date is: 1 day pending improvement in postictal state without further breakthrough seizure.  Patient currently is not medically stable to d/c.   Darreld Mclean MD [facility]  If 7PM-7AM, please contact night-coverage [email or web address]  01/10/2020, 8:02 PM

## 2020-01-10 NOTE — ED Provider Notes (Signed)
[facility] Emergency Department Provider Note   ____________________________________________   First MD Initiated Contact with Patient 01/10/20 1843     (approximate)  I have reviewed the triage vital signs and the nursing notes.   HISTORY  Chief Complaint Seizures    HPI Russell Buchanan is a 44 y.o. male history of bipolar disorder, schizophrenia seizure disorder   Patient reports about 3 to 4 days ago he had a seizure and was seen at the ER.  Then over the last several days he has had additional seizures, including several perhaps 3 or more today.  He has been taking his medications.  He does not know of anything that would be causing this to happen.  He has not been recently ill.  No pain or discomfort.  No drug use.  No recent changes in his medication has been on Vimpat for a couple months now and takes his medications as prescribed.  Of note is facility note does demonstrate that he has been compliant with taking his medication as well    Past Medical History:  Diagnosis Date  . Antisocial personality disorder (HCC)   . Bipolar disorder (HCC)   . Environmental and seasonal allergies   . GERD (gastroesophageal reflux disease)   . Headache   . Hyperlipidemia   . Infected elbow (HCC)   . Schizophrenia (HCC)   . Seizures (HCC)   . Tremors of nervous system     Patient Active Problem List   Diagnosis Date Noted  . Seizure (HCC) 01/10/2020  . AMS (altered mental status) 06/25/2019  . Bipolar disorder (HCC)   . Antisocial personality disorder (HCC)   . Acute encephalopathy 12/04/2016  . GERD (gastroesophageal reflux disease) 12/04/2016  . HLD (hyperlipidemia) 12/04/2016  . Schizophrenia (HCC) 11/15/2016  . Medication side effects 11/15/2016    Past Surgical History:  Procedure Laterality Date  . CLOSED REDUCTION WRIST FRACTURE Left   . HARDWARE REMOVAL Right 04/14/2018   Procedure: HARDWARE REMOVAL-RIGHT ELBOW;  Surgeon: Deeann Saint, MD;  Location: [facility] ORS;  Service: Orthopedics;  Laterality: Right;  . ORIF ELBOW FRACTURE Right   . SCALP LACERATION REPAIR    . WRIST RECONSTRUCTION Left     Prior to Admission medications   Medication Sig Start Date End Date Taking? Authorizing Provider  albuterol (PROVENTIL HFA;VENTOLIN HFA) 108 (90 Base) MCG/ACT inhaler Inhale 2 puffs into the lungs every 6 (six) hours as needed for wheezing or shortness of breath. 01/30/17   Merrily Brittle, MD  benztropine (COGENTIN) 1 MG tablet Take 1 mg by mouth 2 (two) times daily as needed for tremors.     [provider]  Butalbital-APAP-Caffeine 50-325-40 MG per capsule Take 1 capsule by mouth 2 (two) times daily as needed for headache.     [provider]  cetirizine (ZYRTEC) 10 MG tablet Take 10 mg by mouth daily.    [provider]  clonazePAM (KLONOPIN) 1 MG tablet Take 1 mg by mouth 2 (two) times daily as needed for anxiety.    [provider]  clotrimazole-betamethasone (LOTRISONE) cream Apply 1 application topically 3 (three) times daily.    [provider]  divalproex (DEPAKOTE) 500 MG DR tablet Take 1 tablet (500 mg total) by mouth every morning. 06/27/19 06/26/20  Darlin Priestly, MD  divalproex (DEPAKOTE) 500 MG DR tablet Take 2 tablets (1,000 mg total) by mouth at bedtime. 06/27/19   Darlin Priestly, MD  docusate sodium (COLACE) 100 MG capsule Take 100 mg  by mouth daily.    [provider]  EPINEPHrine 0.3 mg/0.3 mL IJ SOAJ injection Inject 0.3 mg into the muscle as needed (allergic reactions).     [provider]  escitalopram (LEXAPRO) 20 MG tablet Take 20 mg by mouth daily.    [provider]  Fremanezumab-vfrm (AJOVY) 225 MG/1.5ML SOSY Inject into the skin every 28 (twenty-eight) days.    [provider]  furosemide (LASIX) 40 MG tablet Take 1 tablet (40 mg total) by mouth daily for 30 days. 06/25/18 06/25/19  Minna Antis, MD  gabapentin (NEURONTIN) 400 MG  capsule Take 1 capsule (400 mg total) by mouth 3 (three) times daily. 04/14/18   Deeann Saint, MD  gemfibrozil (LOPID) 600 MG tablet Take 600 mg by mouth 2 (two) times daily before a meal.    [provider]  haloperidol (HALDOL) 10 MG tablet Take 10 mg by mouth 2 (two) times daily as needed (agitation).     [provider]  ketoconazole (NIZORAL) 2 % cream Apply 1 application topically 2 (two) times daily.    [provider]  linaclotide (LINZESS) 72 MCG capsule Take 72 mcg by mouth daily.     [provider]  lipase/protease/amylase (CREON) 36000 UNITS CPEP capsule Take 36,000 Units by mouth 3 (three) times daily with meals. 2 caps withmeals and 1 with snacks    [provider]  lipase/protease/amylase (CREON) 36000 UNITS CPEP capsule Take 36,000 Units by mouth. With snacks    [provider]  LORazepam (ATIVAN) 1 MG tablet Take 1 mg by mouth every 8 (eight) hours as needed for anxiety.    [provider]  magnesium gluconate (MAGONATE) 500 MG tablet Take 500 mg by mouth daily.    [provider]  meloxicam (MOBIC) 15 MG tablet Take 1 tablet (15 mg total) by mouth daily as needed for pain. 06/27/19   Darlin Priestly, MD  metoprolol tartrate (LOPRESSOR) 50 MG tablet Take 50 mg by mouth daily.    [provider]  naproxen (NAPROSYN) 500 MG tablet Take 1 tablet (500 mg total) by mouth 2 (two) times daily with a meal. 11/21/19   Jene Every, MD  nystatin cream (MYCOSTATIN) Apply 1 application topically 2 (two) times daily as needed for dry skin.     [provider]  omeprazole (PRILOSEC) 40 MG capsule Take 40 mg by mouth 2 (two) times daily before a meal. 04/16/19   [provider]  ondansetron (ZOFRAN) 8 MG tablet Take 8 mg by mouth every 8 (eight) hours as needed for nausea. 06/16/19   [provider]  Paliperidone Palmitate ER (INVEGA TRINZA) 819 MG/2.625ML SUSY Inject into the muscle every 3  (three) months.    [provider]  polyethylene glycol (MIRALAX / GLYCOLAX) packet Take 17 g by mouth daily.    [provider]  pravastatin (PRAVACHOL) 40 MG tablet Take 40 mg by mouth every evening. 06/24/19   [provider]  QUEtiapine (SEROQUEL) 50 MG tablet Take 75 mg by mouth at bedtime. 06/24/19   [provider]  simethicone (MYLICON) 80 MG chewable tablet Chew 80 mg by mouth 2 (two) times daily.    [provider]  sucralfate (CARAFATE) 1 g tablet Take 1 tablet (1 g total) by mouth 4 (four) times daily -  with meals and at bedtime. 05/13/18   Willy Eddy, MD  SUMAtriptan (IMITREX) 100 MG tablet Take 100 mg by mouth every 2 (two) hours as needed for  migraine. May repeat in 2 hours if headache persists or recurs.    [provider]  tiotropium (SPIRIVA) 18 MCG inhalation capsule Don't need this if taking Stiolto. 06/27/19   Darlin Priestly, MD  Tiotropium Bromide-Olodaterol (STIOLTO RESPIMAT IN) Inhale 2 puffs into the lungs daily.    [provider]    Allergies Bee venom  Family History  Family history unknown: Yes    Social History Social History   Tobacco Use  . Smoking status: Current Every Day Smoker    Packs/day: 0.50    Years: 7.00    Pack years: 3.50    Types: Cigarettes  . Smokeless tobacco: Former Neurosurgeon    Types: Chew, Snuff  Vaping Use  . Vaping Use: Never used  Substance Use Topics  . Alcohol use: No  . Drug use: Not Currently    Review of Systems Constitutional: No fever/chills or recent illness except for seizures Eyes: No visual changes.  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 Cardiovascular: Denies chest pain. Respiratory: Denies shortness of breath. Gastrointestinal: No abdominal pain.   Genitourinary: Negative for dysuria. Musculoskeletal: Negative for back pain. Skin: Negative for rash. Neurological: Negative for headaches, areas of focal weakness or numbness.  Positive for  seizures today several.  Denies any falls or injuries.    ____________________________________________   PHYSICAL EXAM:  VITAL SIGNS: ED Triage Vitals  Enc Vitals Group     BP 01/10/20 1843 125/81     Pulse Rate 01/10/20 1844 63     Resp 01/10/20 1843 (!) 23     Temp --      Temp src --      SpO2 01/10/20 1844 94 %     Weight 01/10/20 1843 229 lb 4.5 oz (104 kg)     Height 01/10/20 1843 6\' 1"  (1.854 m)     Head Circumference --      Peak Flow --      Pain Score 01/10/20 1843 0     Pain Loc --      Pain Edu? --      Excl. in GC? --     Constitutional: Alert and oriented. Well appearing and in no acute distress. Eyes: Conjunctivae are normal. Head: Atraumatic.  Left eye demonstrates esotropia, patient reports is chronic.  Moves all extremities well 4-5 strength, reports chronically weak.  No pronator drift.  Equal smile and cranial nerve exam with exception to esotropia which again reports is chronic Nose: No congestion/rhinnorhea. Mouth/Throat: Mucous membranes are moist. Neck: No stridor.  Cardiovascular: Normal rate, regular rhythm. Grossly normal heart sounds.  Good peripheral circulation. Respiratory: Normal respiratory effort.  No retractions. Lungs CTAB. Gastrointestinal: Soft and nontender. No distention. Musculoskeletal: No lower extremity tenderness nor edema. Neurologic:  Normal speech and language. No gross focal neurologic deficits are appreciated.  Skin:  Skin is warm, dry and intact. No rash noted. Psychiatric: Mood and affect are normal. Speech and behavior are normal.  ____________________________________________   LABS (all labs ordered are listed, but only abnormal results are displayed)  Labs Reviewed  COMPREHENSIVE METABOLIC PANEL - Abnormal; Notable for the following components:      Result Value   Glucose, Bld 101 (*)    Alkaline Phosphatase 135 (*)    All other components within normal limits  SARS CORONAVIRUS 2 BY RT PCR (HOSPITAL ORDER,  PERFORMED IN [place] [facility])  CBC  VALPROIC ACID LEVEL  CBG MONITORING, ED   ____________________________________________  EKG  Reviewed interpreted by me at 1845 Heart rate 65 QRs 100 QTc 450 Normal sinus rhythm, no evidence of acute ischemia or ____________________________________________  RADIOLOGY  I do not see indication for imaging at this time.  Patient fully recovered without ongoing seizure activity with no complaint of headache or acute neuro deficits.  Known history of seizure disorder and no report of trauma ____________________________________________   PROCEDURES  Procedure(s) performed: None  Procedures  Critical Care performed: No  ____________________________________________   INITIAL IMPRESSION / ASSESSMENT AND PLAN / ED COURSE  Pertinent labs & imaging results that were available during my care of the patient were reviewed by me and considered in my medical decision making (see chart for details).   Seizures.  Ongoing, reporting multiple seizures.  History of seizure disorder.  Discussed with our neurologist, advised to give Vimpat dose loading (Dr. Derry Lory) 150 mg.  He would recommend patient be observed in the hospital for seizures due to the frequency and the patient's history of multiple seizures despite compliance with medications.  Patient appears quite well and in no distress.  Resting comfortably neurologically intact.  No obvious precipitating factors, labs from just a few days ago are reviewed and reassuring.  Discussed with the patient and we will admit for observation after additional Vimpat load which has been ordered as recommended by neurology.  Discussed case with hospitalist who is in agreement, discussed with Dr. Darrin Luis was evaluated in Emergency Department on 01/10/2020 for the symptoms described in the history of present illness. He was evaluated in the context of the global COVID-19 pandemic, which  necessitated consideration that the patient might be at risk for infection with the SARS-CoV-2 virus that causes COVID-19. Institutional protocols and algorithms that pertain to the evaluation of patients at risk for COVID-19 are in a state of rapid change based on information released by regulatory bodies including the CDC and federal and state organizations. These policies and algorithms were followed during the patient's care in the ED.   ____________________________________________   FINAL CLINICAL IMPRESSION(S) / ED DIAGNOSES  Final diagnoses:  Seizures (HCC)        Note:  This document was prepared using Dragon voice recognition software and may include unintentional dictation errors       Sharyn Creamer, MD 01/10/20 1926

## 2020-01-10 NOTE — ED Notes (Addendum)
Admitting  Provider at bedside.  Pt reports left armpit pain related to seizures

## 2020-01-10 NOTE — ED Notes (Signed)
Report complete att - arranging transport

## 2020-01-11 DIAGNOSIS — E785 Hyperlipidemia, unspecified: Secondary | ICD-10-CM

## 2020-01-11 DIAGNOSIS — R569 Unspecified convulsions: Secondary | ICD-10-CM

## 2020-01-11 DIAGNOSIS — K219 Gastro-esophageal reflux disease without esophagitis: Secondary | ICD-10-CM | POA: Diagnosis not present

## 2020-01-11 DIAGNOSIS — F203 Undifferentiated schizophrenia: Secondary | ICD-10-CM | POA: Diagnosis not present

## 2020-01-11 LAB — BASIC METABOLIC PANEL
Anion gap: 7 (ref 5–15)
BUN: 8 mg/dL (ref 6–20)
CO2: 28 mmol/L (ref 22–32)
Calcium: 8.8 mg/dL — ABNORMAL LOW (ref 8.9–10.3)
Chloride: 107 mmol/L (ref 98–111)
Creatinine, Ser: 0.57 mg/dL — ABNORMAL LOW (ref 0.61–1.24)
GFR calc Af Amer: >60 mL/min (ref >60)
GFR calc non Af Amer: >60 mL/min (ref >60)
Glucose, Bld: 90 mg/dL (ref 70–99)
Potassium: 4.1 mmol/L (ref 3.5–5.1)
Sodium: 142 mmol/L (ref 135–145)

## 2020-01-11 LAB — CK: Total CK: 269 U/L (ref 49–397)

## 2020-01-11 MED ORDER — ARFORMOTEROL TARTRATE 15 MCG/2ML IN NEBU
15.0000 ug | INHALATION_SOLUTION | Freq: Two times a day (BID) | RESPIRATORY_TRACT | Status: DC
  Filled 2020-01-11 (×3): qty 2

## 2020-01-11 MED ORDER — UMECLIDINIUM BROMIDE 62.5 MCG/INH IN AEPB
1.0000 | INHALATION_SPRAY | Freq: Every day | RESPIRATORY_TRACT | Status: DC
  Administered 2020-01-11: 1 via RESPIRATORY_TRACT
  Filled 2020-01-11: qty 7

## 2020-01-11 NOTE — Discharge Summary (Signed)
Discharge Summary  Russell Buchanan VPX:[id number] DOB: April 08, 1976  PCP: Remi Haggard, FNP  Admit date: 01/10/2020 Discharge date: 01/11/2020  Time spent: 30 mins  Recommendations for Outpatient Follow-up:  1. Follow-up with PCP in 1 week 2. Follow-up with neurology, plan to schedule patient for outpatient epilepsy monitoring unit  Discharge Diagnoses:  Active Hospital Problems   Diagnosis Date Noted  . Seizure ([place]) 01/10/2020  . Bipolar disorder ([place])   . GERD (gastroesophageal reflux disease) 12/04/2016  . HLD (hyperlipidemia) 12/04/2016  . Schizophrenia ([place]) 11/15/2016    Resolved Hospital Problems  No resolved problems to display.    Discharge Condition: Stable  Diet recommendation: Regular  Vitals:   01/11/20 1133 01/11/20 1611  BP: 117/79 124/74  Pulse: 72 (!) 59  Resp:    Temp: 98.3 F (36.8 C) 98.5 F (36.9 C)  SpO2: 98% 97%    History of present illness:  Russell Buchanan is a 44 y.o. male with medical history significant for schizophrenia/bipolar disorder, seizure disorder, migraines, hyperlipidemia, chronic nausea and constipation, GERD, and tobacco use who presents to the ED for evaluation of seizure disorder. Patient recently seen in the ED on 01/05/2020 for generalized weakness suspected due to postictal state from seizure.  He was provided supportive care with IV fluids.  CT head without contrast was the normal study.  Valproic acid level was therapeutic at 61.  He was discharged back to his group home with advice to follow-up with his neurologist. Patient states that he has had recurrent seizures beginning this morning (01/10/2020), described as generalized weakness, lethargy, slow and slurred speech.  He says he had another spell this afternoon and EMS services were called and he was brought to the ED for further evaluation. In the ED, VSS, labs fairly stable. EDP discussed with on-call neurology who recommended loading with IV Vimpat and observing overnight  for further seizure activity.  The hospitalist service was consulted to admit for further evaluation and management.    Today, met patient sitting up in bed, eating his breakfast, denies any new complaints. No noted seizure activity since admission. Patient denies any chest pain, shortness of breath, abdominal pain, nausea/vomiting, fever/chills. Discussed with neurologist Dr. Rory Percy, noted EEG unremarkable, okay to discharge to follow-up with patient's neurologist and plans to set patient up with outpatient epilepsy monitoring unit.     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 and 1000 mg nightly, gabapentin 600 mg 3 times daily, Vimpat 100 mg twice daily Discussed with neurology Dr. Rory Percy, stated EEG is unremarkable, recommend follow-up with outpatient neurologist, and will plan to set this patient up with outpatient epilepsy monitoring unit in the future Follow-up with PCP, outpatient neurology  Schizophrenia/bipolar disorder: Continue home regimen  Hyperlipidemia: Continue pravastatin and gemfibrozil.  GERD Chronic nausea Chronic constipation Resume home PPI, Linzess, Creon.  Drug-induced tremors: Continue propranolol.        Malnutrition Type:      Malnutrition Characteristics:      Nutrition Interventions:      Estimated body mass index is 32.07 kg/m as calculated from the following:   Height as of this encounter: '5\' 11"'  (1.803 m).   Weight as  of this encounter: 104.3 kg.    Procedures:  None  Consultations:  Discussed with Dr. Rory Percy on 01/11/2020  Discharge Exam: BP 124/74 (BP  Location: Right Arm)   Pulse (!) 59   Temp 98.5 F (36.9 C) (Oral)   Resp 18   Ht '5\' 11"'  (1.803 m)   Wt 104.3 kg   SpO2 97%   BMI 32.07 kg/m   General: NAD Cardiovascular: S1, S2 present Respiratory: CTA B    Discharge Instructions You were cared for by a hospitalist during your hospital stay. If you have any questions about your discharge medications or the care you received while you were in the hospital after you are discharged, you can call the unit and asked to speak with the hospitalist on call if the hospitalist that took care of you is not available. Once you are discharged, your primary care physician will handle any further medical issues. Please note that NO REFILLS for any discharge medications will be authorized once you are discharged, as it is imperative that you return to your primary care physician (or establish a relationship with a primary care physician if you do not have one) for your aftercare needs so that they can reassess your need for medications and monitor your lab values.  Discharge Instructions    Diet - low sodium heart healthy   Complete by: As directed    Increase activity slowly   Complete by: As directed      Allergies as of 01/11/2020      Reactions   Bee Venom Hives      Medication List    STOP taking these medications   clotrimazole-betamethasone cream Commonly known as: LOTRISONE   haloperidol 10 MG tablet Commonly known as: HALDOL   nystatin cream Commonly known as: MYCOSTATIN   tiotropium 18 MCG inhalation capsule Commonly known as: SPIRIVA     TAKE these medications   Ajovy 225 MG/1.5ML Sosy Generic drug: Fremanezumab-vfrm Inject into the skin every 28 (twenty-eight) days.   albuterol 108 (90 Base) MCG/ACT inhaler Commonly known as: VENTOLIN HFA Inhale 2 puffs into the lungs every 6 (six) hours as needed for wheezing or shortness of breath.   benztropine 1 MG tablet Commonly known as: COGENTIN Take 1 mg by mouth 3  (three) times daily.   cetirizine 10 MG tablet Commonly known as: ZYRTEC Take 10 mg by mouth daily.   Creon 36000 UNITS Cpep capsule Generic drug: lipase/protease/amylase Take 36,000 Units by mouth 3 (three) times daily with meals. 2 caps withmeals and 1 with snacks   divalproex 500 MG DR tablet Commonly known as: DEPAKOTE Take 1 tablet (500 mg total) by mouth every morning.   divalproex 500 MG DR tablet Commonly known as: DEPAKOTE Take 2 tablets (1,000 mg total) by mouth at bedtime.   docusate sodium 100 MG capsule Commonly known as: COLACE Take 100 mg by mouth daily.   EPINEPHrine 0.3 mg/0.3 mL Soaj injection Commonly known as: EPI-PEN Inject 0.3 mg into the muscle as needed (allergic reactions).   escitalopram 20 MG tablet Commonly known as: LEXAPRO Take 20 mg by mouth daily.   furosemide 40 MG tablet Commonly known as: Lasix Take 1 tablet (40 mg total) by mouth daily for 30 days.   gabapentin 600 MG tablet Commonly known as: NEURONTIN Take 600 mg by mouth 3 (three) times daily.   gemfibrozil 600 MG tablet Commonly known as: LOPID Take 600 mg by mouth 2 (two) times daily before a meal.  Invega Trinza 819 MG/2.625ML injection Generic drug: Paliperidone Palmitate ER Inject into the muscle every 3 (three) months.   ketoconazole 2 % cream Commonly known as: NIZORAL Apply 1 application topically 2 (two) times daily.   Linzess 72 MCG capsule Generic drug: linaclotide Take 72 mcg by mouth daily.   meloxicam 15 MG tablet Commonly known as: MOBIC Take 1 tablet (15 mg total) by mouth daily as needed for pain.   omeprazole 40 MG capsule Commonly known as: PRILOSEC Take 40 mg by mouth 2 (two) times daily before a meal.   ondansetron 8 MG tablet Commonly known as: ZOFRAN Take 8 mg by mouth every 8 (eight) hours as needed for nausea.   polyethylene glycol 17 g packet Commonly known as: MIRALAX / GLYCOLAX Take 17 g by mouth daily.   potassium chloride SA 20  MEQ tablet Commonly known as: KLOR-CON Take 20 mEq by mouth daily.   pravastatin 40 MG tablet Commonly known as: PRAVACHOL Take 40 mg by mouth every evening.   propranolol ER 160 MG SR capsule Commonly known as: INDERAL LA Take 160 mg by mouth daily.   QUEtiapine 100 MG tablet Commonly known as: SEROQUEL Take 100 mg by mouth at bedtime.   simethicone 80 MG chewable tablet Commonly known as: MYLICON Chew 80 mg by mouth 2 (two) times daily.   STIOLTO RESPIMAT IN Inhale 2 puffs into the lungs daily.   SUMAtriptan 100 MG tablet Commonly known as: IMITREX Take 100 mg by mouth every 2 (two) hours as needed for migraine. May repeat in 2 hours if headache persists or recurs.   Vimpat 100 MG Tabs Generic drug: Lacosamide Take 100 mg by mouth 2 (two) times daily.      Allergies  Allergen Reactions  . Bee Venom Hives    Follow-up Information    Remi Haggard, FNP. Schedule an appointment as soon as possible for a visit in 1 week(s).   Specialty: Family Medicine Contact information: [place] [address] [phone]                The results of significant diagnostics from this hospitalization (including imaging, microbiology, ancillary and laboratory) are listed below for reference.    Significant Diagnostic Studies: DG Chest 2 View  Result Date: 01/05/2020 CLINICAL DATA:  Weakness EXAM: CHEST - 2 VIEW COMPARISON:  07/07/2019 FINDINGS: The heart size and mediastinal contours are within normal limits. Both lungs are clear. The visualized skeletal structures are unremarkable. IMPRESSION: Normal study. Electronically Signed   By: Rolm Baptise M.D.   On: 01/05/2020 10:04   CT Head Wo Contrast  Result Date: 01/05/2020 CLINICAL DATA:  Mental status changes EXAM: CT HEAD WITHOUT CONTRAST TECHNIQUE: Contiguous axial images were obtained from the base of the skull through the vertex without intravenous contrast. COMPARISON:  08/02/2019 FINDINGS: Brain:  No evidence of acute infarction, hemorrhage, hydrocephalus, extra-axial collection or mass lesion/mass effect. Vascular: No hyperdense vessel or unexpected calcification. Skull: Normal. Negative for fracture or focal lesion. Sinuses/Orbits: No acute finding. Other: None. IMPRESSION: Normal head CT. Electronically Signed   By: Inez Catalina M.D.   On: 01/05/2020 10:20    Microbiology: Recent Results (from the past 240 hour(s))  SARS Coronavirus 2 by RT PCR (hospital order, performed in [facility] hospital lab) Nasopharyngeal Nasopharyngeal Swab     Status: None   Collection Time: 01/10/20  7:00 PM   Specimen: Nasopharyngeal Swab  Result Value Ref Range Status   SARS Coronavirus 2  NEGATIVE NEGATIVE Final    Comment: (NOTE) SARS-CoV-2 target nucleic acids are NOT DETECTED.  The SARS-CoV-2 RNA is generally detectable in upper and lower respiratory specimens during the acute phase of infection. The lowest concentration of SARS-CoV-2 viral copies this assay can detect is 250 copies / mL. A negative result does not preclude SARS-CoV-2 infection and should not be used as the sole basis for treatment or other patient management decisions.  A negative result may occur with improper specimen collection / handling, submission of specimen other than nasopharyngeal swab, presence of viral mutation(s) within the areas targeted by this assay, and inadequate number of viral copies (<250 copies / mL). A negative result must be combined with clinical observations, patient history, and epidemiological information.  Fact Sheet for Patients:   StrictlyIdeas.no  Fact Sheet for Healthcare Providers: BankingDealers.co.za  This test is not yet approved or  cleared by the [place] FDA and has been authorized for detection and/or diagnosis of SARS-CoV-2 by FDA under an Emergency Use Authorization (EUA).  This EUA will remain in effect (meaning this test can be  used) for the duration of the COVID-19 declaration under Section 564(b)(1) of the Act, 21 U.S.C. section 360bbb-3(b)(1), unless the authorization is terminated or revoked sooner.  Performed at [facility], [address] [place],  [address]      Labs: Basic Metabolic Panel: Recent Labs  Lab 01/05/20 0936 01/10/20 1900 01/11/20 0445  NA 135 137 142  K 4.2 4.3 4.1  CL 100 100 107  CO2 '24 24 28  ' GLUCOSE 99 101* 90  BUN '7 11 8  ' CREATININE 0.71 0.67 0.57*  CALCIUM 9.2 9.3 8.8*  MG  --  2.1  --    Liver Function Tests: Recent Labs  Lab 01/05/20 0936 01/10/20 1900  AST 16 21  ALT 10 14  ALKPHOS 127* 135*  BILITOT 0.8 0.8  PROT 7.1 7.2  ALBUMIN 3.9 3.9   No results for input(s): LIPASE, AMYLASE in the last 168 hours. No results for input(s): AMMONIA in the last 168 hours. CBC: Recent Labs  Lab 01/05/20 0936 01/10/20 1900  WBC 9.0 6.3  NEUTROABS 5.6  --   HGB 15.4 14.8  HCT 44.3 43.0  MCV 87.2 87.6  PLT 253 240   Cardiac Enzymes: Recent Labs  Lab 01/10/20 1900 01/11/20 0445  CKTOTAL 514* 269   BNP: BNP (last 3 results) No results for input(s): BNP in the last 8760 hours.  ProBNP (last 3 results) No results for input(s): PROBNP in the last 8760 hours.  CBG: Recent Labs  Lab 01/10/20 1941  GLUCAP 98       Signed:  Alma Friendly, MD [facility] 01/11/2020, 4:52 PM

## 2020-01-11 NOTE — Progress Notes (Addendum)
MEDICATION RELATED CONSULT NOTE  Pharmacy Consult for drug interaction and monitoring of AEDs   Allergies  Allergen Reactions  . Bee Venom Hives   Patient Measurements: Height: 5\' 11"  (180.3 cm) Weight: 104.3 kg (229 lb 15 oz) IBW/kg (Calculated) : 75.3  Vital Signs: Temp: 98.3 F (36.8 C) (08/09 1133) Temp Source: Oral (08/09 1133) BP: 117/79 (08/09 1133) Pulse Rate: 72 (08/09 1133)   Labs: Recent Labs    01/10/20 1900 01/11/20 0445  WBC 6.3  --   HGB 14.8  --   HCT 43.0  --   PLT 240  --   CREATININE 0.67 0.57*  MG 2.1  --   ALBUMIN 3.9  --   PROT 7.2  --   AST 21  --   ALT 14  --   ALKPHOS 135*  --   BILITOT 0.8  --    Estimated Creatinine Clearance: 146.3 mL/min (A) (by C-G formula based on SCr of 0.57 mg/dL (L)).  Medical History: Past Medical History:  Diagnosis Date  . Antisocial personality disorder (HCC)   . Bipolar disorder (HCC)   . Environmental and seasonal allergies   . GERD (gastroesophageal reflux disease)   . Headache   . Hyperlipidemia   . Infected elbow (HCC)   . Schizophrenia (HCC)   . Seizures (HCC)   . Tremors of nervous system     Medications:  Scheduled:  . arformoterol  15 mcg Nebulization BID   And  . umeclidinium bromide  1 puff Inhalation Daily  . divalproex  1,000 mg Oral QHS  . divalproex  500 mg Oral q morning - 10a  . enoxaparin (LOVENOX) injection  40 mg Subcutaneous Q24H  . escitalopram  20 mg Oral Daily  . gabapentin  600 mg Oral TID  . gemfibrozil  600 mg Oral BID AC  . lacosamide  100 mg Oral BID  . linaclotide  72 mcg Oral Daily  . lipase/protease/amylase  36,000 Units Oral TID WC  . pantoprazole  80 mg Oral Daily  . pravastatin  40 mg Oral QPM  . propranolol ER  160 mg Oral Daily  . QUEtiapine  100 mg Oral QHS    Assessment: 44 year old male admitted with seizures. Patient has a history of schizophrenia/bipolar disorder, seizures, migraines, HLD, GERD, and tobacco use. Valproate level on admission 61 -  WNL. Patient received a loading dose of Vimpat 100 mg in ED. Patient is receiving Vimpat 100 mg PO BID and Depakote 500 mg PO QAM + 1000 mg PO QHS. 8/3 ECG showed short -PR interval and 8/8 PR interval was normal. HR 60s.  Plan: --DDI with Vimpat and propranolol - Monitor HR + ECG due to risk of bradycardia --Monitor daily for DDIs with AEDs (Vimpat and Depakote) and other medications  55, PharmD Pharmacy Resident  01/11/2020 1:08 PM

## 2020-01-11 NOTE — Progress Notes (Signed)
IV's removed before discharge. Called caretaker [name], she advised to call [employer] that she had an account with them, to pick patient up at the hospital and return him to group home.

## 2020-01-11 NOTE — [place] FL2 (Signed)
[place] MEDICAID FL2 LEVEL OF CARE SCREENING TOOL     IDENTIFICATION  Patient Name: Russell Buchanan Birthdate: 07-16-1975 Sex: male Admission Date (Current Location): 01/10/2020  [facility] and [place] Number:  Chiropodist and Address:  [facility], [address], [place], [place] [address]      Provider Number: [id number]  Attending Physician Name and Address:  Briant Cedar, MD  Relative Name and Phone Number:       Current Level of Care: Hospital Recommended Level of Care: [facility], Other (Comment) (Group home) Prior Approval Number:    Date Approved/Denied:   PASRR Number:    Discharge Plan: Other (Comment) (Group home)    Current Diagnoses: Patient Active Problem List   Diagnosis Date Noted   Seizure (HCC) 01/10/2020   AMS (altered mental status) 06/25/2019   Bipolar disorder (HCC)    Antisocial personality disorder (HCC)    Acute encephalopathy 12/04/2016   GERD (gastroesophageal reflux disease) 12/04/2016   HLD (hyperlipidemia) 12/04/2016   Schizophrenia (HCC) 11/15/2016   Medication side effects 11/15/2016    Orientation RESPIRATION BLADDER Height & Weight     Self, Time, Situation, Place  Normal Continent Weight: 104.3 kg Height:  5\' 11"  (180.3 cm)  BEHAVIORAL SYMPTOMS/MOOD NEUROLOGICAL BOWEL NUTRITION STATUS      Continent Diet (heart healthy)  AMBULATORY STATUS COMMUNICATION OF NEEDS Skin   Independent Verbally Normal                       Personal Care Assistance Level of Assistance  Bathing, Feeding, Dressing Bathing Assistance: Independent Feeding assistance: Independent Dressing Assistance: Independent     Functional Limitations Info             SPECIAL CARE FACTORS FREQUENCY                       Contractures Contractures Info: Not present    Additional Factors Info  Code Status, Allergies Code Status Info: Full Allergies Info: Bee venom            Current Medications (01/11/2020):  This is the current hospital active medication list Current Facility-Administered Medications  Medication Dose Route Frequency Provider Last Rate Last Admin   acetaminophen (TYLENOL) tablet 650 mg  650 mg Oral Q4H PRN 03/12/2020, MD       Or   acetaminophen (TYLENOL) suppository 650 mg  650 mg Rectal Q4H PRN Charlsie Quest, MD       albuterol (PROVENTIL) (2.5 MG/3ML) 0.083% nebulizer solution 3 mL  3 mL Nebulization Q6H PRN Charlsie Quest, MD       arformoterol (BROVANA) nebulizer solution 15 mcg  15 mcg Nebulization BID Hallaji, Sheema M, RPH       And   umeclidinium bromide (INCRUSE ELLIPTA) 62.5 MCG/INH 1 puff  1 puff Inhalation Daily Hallaji, Sheema M, RPH   1 puff at 01/11/20 0910   divalproex (DEPAKOTE) DR tablet 1,000 mg  1,000 mg Oral QHS Patel, Vishal R, MD       divalproex (DEPAKOTE) DR tablet 500 mg  500 mg Oral q morning - 10a 03/12/20 R, MD   500 mg at 01/11/20 0853   enoxaparin (LOVENOX) injection 40 mg  40 mg Subcutaneous Q24H 03/12/20 R, MD   40 mg at 01/10/20 2240   escitalopram (LEXAPRO) tablet 20 mg  20 mg Oral Daily 03/11/20, MD   20 mg  at 01/11/20 0851   gabapentin (NEURONTIN) tablet 600 mg  600 mg Oral TID Charlsie Quest, MD   600 mg at 01/11/20 1651   gemfibrozil (LOPID) tablet 600 mg  600 mg Oral BID AC Darreld Mclean R, MD   600 mg at 01/11/20 1653   lacosamide (VIMPAT) tablet 100 mg  100 mg Oral BID Charlsie Quest, MD   100 mg at 01/11/20 0853   linaclotide (LINZESS) capsule 72 mcg  72 mcg Oral Daily Charlsie Quest, MD   72 mcg at 01/11/20 0853   lipase/protease/amylase (CREON) capsule 36,000 Units  36,000 Units Oral TID WC Charlsie Quest, MD   36,000 Units at 01/11/20 1653   LORazepam (ATIVAN) injection 1-2 mg  1-2 mg Intravenous Q2H PRN Charlsie Quest, MD       pantoprazole (PROTONIX) EC tablet 80 mg  80 mg Oral Daily Charlsie Quest, MD   80 mg at 01/11/20 0854   pravastatin (PRAVACHOL)  tablet 40 mg  40 mg Oral QPM Charlsie Quest, MD   40 mg at 01/11/20 1652   propranolol ER (INDERAL LA) 24 hr capsule 160 mg  160 mg Oral Daily Charlsie Quest, MD   160 mg at 01/11/20 0851   QUEtiapine (SEROQUEL) tablet 100 mg  100 mg Oral QHS Charlsie Quest, MD       senna-docusate (Senokot-S) tablet 1 tablet  1 tablet Oral QHS PRN Charlsie Quest, MD         Discharge Medications: Medication List    STOP taking these medications   clotrimazole-betamethasone cream Commonly known as: LOTRISONE   haloperidol 10 MG tablet Commonly known as: HALDOL   nystatin cream Commonly known as: MYCOSTATIN   tiotropium 18 MCG inhalation capsule Commonly known as: SPIRIVA     TAKE these medications   Ajovy 225 MG/1.5ML Sosy Generic drug: Fremanezumab-vfrm Inject into the skin every 28 (twenty-eight) days.   albuterol 108 (90 Base) MCG/ACT inhaler Commonly known as: VENTOLIN HFA Inhale 2 puffs into the lungs every 6 (six) hours as needed for wheezing or shortness of breath.   benztropine 1 MG tablet Commonly known as: COGENTIN Take 1 mg by mouth 3 (three) times daily.   cetirizine 10 MG tablet Commonly known as: ZYRTEC Take 10 mg by mouth daily.   Creon 36000 UNITS Cpep capsule Generic drug: lipase/protease/amylase Take 36,000 Units by mouth 3 (three) times daily with meals. 2 caps withmeals and 1 with snacks   divalproex 500 MG DR tablet Commonly known as: DEPAKOTE Take 1 tablet (500 mg total) by mouth every morning.   divalproex 500 MG DR tablet Commonly known as: DEPAKOTE Take 2 tablets (1,000 mg total) by mouth at bedtime.   docusate sodium 100 MG capsule Commonly known as: COLACE Take 100 mg by mouth daily.   EPINEPHrine 0.3 mg/0.3 mL Soaj injection Commonly known as: EPI-PEN Inject 0.3 mg into the muscle as needed (allergic reactions).   escitalopram 20 MG tablet Commonly known as: LEXAPRO Take 20 mg by mouth daily.   furosemide 40 MG  tablet Commonly known as: Lasix Take 1 tablet (40 mg total) by mouth daily for 30 days.   gabapentin 600 MG tablet Commonly known as: NEURONTIN Take 600 mg by mouth 3 (three) times daily.   gemfibrozil 600 MG tablet Commonly known as: LOPID Take 600 mg by mouth 2 (two) times daily before a meal.   Invega Trinza 819 MG/2.625ML injection Generic drug: Paliperidone Palmitate ER  Inject into the muscle every 3 (three) months.   ketoconazole 2 % cream Commonly known as: NIZORAL Apply 1 application topically 2 (two) times daily.   Linzess 72 MCG capsule Generic drug: linaclotide Take 72 mcg by mouth daily.   meloxicam 15 MG tablet Commonly known as: MOBIC Take 1 tablet (15 mg total) by mouth daily as needed for pain.   omeprazole 40 MG capsule Commonly known as: PRILOSEC Take 40 mg by mouth 2 (two) times daily before a meal.   ondansetron 8 MG tablet Commonly known as: ZOFRAN Take 8 mg by mouth every 8 (eight) hours as needed for nausea.   polyethylene glycol 17 g packet Commonly known as: MIRALAX / GLYCOLAX Take 17 g by mouth daily.   potassium chloride SA 20 MEQ tablet Commonly known as: KLOR-CON Take 20 mEq by mouth daily.   pravastatin 40 MG tablet Commonly known as: PRAVACHOL Take 40 mg by mouth every evening.   propranolol ER 160 MG SR capsule Commonly known as: INDERAL LA Take 160 mg by mouth daily.   QUEtiapine 100 MG tablet Commonly known as: SEROQUEL Take 100 mg by mouth at bedtime.   simethicone 80 MG chewable tablet Commonly known as: MYLICON Chew 80 mg by mouth 2 (two) times daily.   STIOLTO RESPIMAT IN Inhale 2 puffs into the lungs daily.   SUMAtriptan 100 MG tablet Commonly known as: IMITREX Take 100 mg by mouth every 2 (two) hours as needed for migraine. May repeat in 2 hours if headache persists or recurs.   Vimpat 100 MG Tabs Generic drug: Lacosamide Take 100 mg by mouth 2 (two) times daily.            Relevant  Imaging Results:  Relevant Lab Results:   Additional Information SS#: [id number]  Allayne Butcher, RN

## 2020-01-11 NOTE — Procedures (Addendum)
Patient Name: Russell Buchanan  MRN: [id number]  Epilepsy Attending: Charlsie Quest  Referring Physician/Provider: Dr Darreld Mclean Date:  01/11/2020 Duration: 21.59 mins  Patient history: 44yo M with seizure like episodes. EEG to evaluate for seizure.  Level of alertness: Awake, asleep  AEDs during EEG study: VPA, GBP, LCM  Technical aspects: This EEG study was done with scalp electrodes positioned according to the 10-20 International system of electrode placement. Electrical activity was acquired at a sampling rate of 500Hz  and reviewed with a high frequency filter of 70Hz  and a low frequency filter of 1Hz . EEG data were recorded continuously and digitally stored.   Description: No posterior dominant rhythm was seen. Sleep was characterized by vertex waves, sleep spindles (12 to 14 Hz), maximal frontocentral region.  EEG showed continuous generalized 3 to 6 Hz theta-delta slowing.  Physiology photic driving was not seen during photic stimulation.  Hyperventilation was not performed.     ABNORMALITY -Continuous slow, generalized  IMPRESSION: This study is suggestive of mild to moderate diffuse encephalopathy, nonspecific etiology. No seizures or epileptiform discharges were seen throughout the recording.   Innocence Schlotzhauer 

## 2020-01-11 NOTE — TOC Initial Note (Signed)
Transition of Care [facility]) - Initial/Assessment Note    Patient Details  Name: Carlyle Mcelrath MRN: [id number] Date of Birth: [birth year]  [facility] CM/SW Contact:    Allayne Butcher, RN Phone Number: 01/11/2020, 11:19 AM  Clinical Narrative:                 Patient placed under observation for seizure activity.  Patient has a history of seizures and is followed by neurology outpatient.  Patient lives at [facility] and reports he has been living there for about a year, before that he was at another group home.  Patient is mostly independent and requires no assistive devices at home.  Contact person is Leisure centre manager at [facility] group home.    Expected Discharge Plan: Group Home Barriers to Discharge: Continued Medical Work up   Patient Goals and CMS Choice Patient states their goals for this hospitalization and ongoing recovery are:: To get back home   Choice offered to / list presented to : NA  Expected Discharge Plan and Services Expected Discharge Plan: Group Home   Discharge Planning Services: CM Consult   Living arrangements for the past 2 months: Group Home                                      Prior Living Arrangements/Services Living arrangements for the past 2 months: Group Home Lives with:: Facility Resident Patient language and need for interpreter reviewed:: Yes Do you feel safe going back to the place where you live?: Yes      Need for Family Participation in Patient Care: Yes (Comment) (seizures) Care giver support system in place?: Yes (comment) (lives in group home)   Criminal Activity/Legal Involvement Pertinent to Current Situation/Hospitalization: No - Comment as needed  Activities of Daily Living Home Assistive Devices/Equipment: None ADL Screening (condition at time of admission) Patient's cognitive ability adequate to safely complete daily activities?: No Is the patient deaf or have difficulty hearing?: No Does the patient  have difficulty seeing, even when wearing glasses/contacts?: No Does the patient have difficulty concentrating, remembering, or making decisions?: Yes Patient able to express need for assistance with ADLs?: Yes Does the patient have difficulty dressing or bathing?: Yes Independently performs ADLs?: Yes (appropriate for developmental age) Does the patient have difficulty walking or climbing stairs?: Yes Weakness of Legs: Both Weakness of Arms/Hands: Both  Permission Sought/Granted Permission sought to share information with : Case Manager, Oceanographer granted to share information with : Yes, Verbal Permission Granted  Share Information with NAME: [name]  Permission granted to share info w AGENCY: [facility]  Permission granted to share info w Relationship: group home owner     Emotional Assessment Appearance:: Appears stated age Attitude/Demeanor/Rapport: Engaged Affect (typically observed): Accepting Orientation: : Oriented to Self, Oriented to Place, Oriented to  Time, Oriented to Situation Alcohol / Substance Use: Not Applicable Psych Involvement: No (comment)  Admission diagnosis:  Seizure (HCC) [R56.9] Seizures (HCC) [R56.9] Patient Active Problem List   Diagnosis Date Noted  . Seizure (HCC) 01/10/2020  . AMS (altered mental status) 06/25/2019  . Bipolar disorder (HCC)   . Antisocial personality disorder (HCC)   . Acute encephalopathy 12/04/2016  . GERD (gastroesophageal reflux disease) 12/04/2016  . HLD (hyperlipidemia) 12/04/2016  . Schizophrenia (HCC) 11/15/2016  . Medication side effects 11/15/2016   PCP:  Armando Gang, FNP Pharmacy:  [place], [place] - [address] [place] [address] Phone: [phone] Fax: [phone]     Social Determinants of Health (SDOH) Interventions    Readmission Risk Interventions No flowsheet data found.

## 2020-01-11 NOTE — Progress Notes (Signed)
Pharmacist- Physician Communication   Patient's home medication Olodaterol / tiotropium (Stiolto Respimat) was ordered on admission. This medication is non-formulary medication with an approved AUTO-SUBSTITUTION.   Per [facility] policy, Stiolto was therapeutically substituted with: Arformoterol (Brovana) neb 15 mcg BID + umeclidinium (Incruse) inhaler 1 puff daily  Gardner Candle, PharmD, BCPS Clinical Pharmacist 01/11/2020 8:36 AM

## 2020-01-11 NOTE — Progress Notes (Signed)
eeg done °

## 2020-01-27 ENCOUNTER — Encounter: Payer: Self-pay | Admitting: Emergency Medicine

## 2020-01-27 ENCOUNTER — Other Ambulatory Visit: Payer: Self-pay

## 2020-01-27 ENCOUNTER — Emergency Department
Admission: EM | Admit: 2020-01-27 | Discharge: 2020-01-27 | Disposition: A | Discharge status: Home / Self Care | Payer: Medicaid Other | Attending: Emergency Medicine | Admitting: Emergency Medicine

## 2020-01-27 DIAGNOSIS — F17210 Nicotine dependence, cigarettes, uncomplicated: Secondary | ICD-10-CM | POA: Diagnosis not present

## 2020-01-27 DIAGNOSIS — R569 Unspecified convulsions: Secondary | ICD-10-CM | POA: Diagnosis not present

## 2020-01-27 LAB — BASIC METABOLIC PANEL
Anion gap: 12 (ref 5–15)
BUN: 13 mg/dL (ref 6–20)
CO2: 25 mmol/L (ref 22–32)
Calcium: 9.1 mg/dL (ref 8.9–10.3)
Chloride: 103 mmol/L (ref 98–111)
Creatinine, Ser: 0.71 mg/dL (ref 0.61–1.24)
GFR calc Af Amer: >60 mL/min (ref >60)
GFR calc non Af Amer: >60 mL/min (ref >60)
Glucose, Bld: 95 mg/dL (ref 70–99)
Potassium: 4.1 mmol/L (ref 3.5–5.1)
Sodium: 140 mmol/L (ref 135–145)

## 2020-01-27 LAB — CBC
HCT: 42.3 % (ref 39.0–52.0)
Hemoglobin: 15 g/dL (ref 13.0–17.0)
MCH: 30.9 pg (ref 26.0–34.0)
MCHC: 35.5 g/dL (ref 30.0–36.0)
MCV: 87.2 fL (ref 80.0–100.0)
Platelets: 245 10*3/uL (ref 150–400)
RBC: 4.85 MIL/uL (ref 4.22–5.81)
RDW: 13.6 % (ref 11.5–15.5)
WBC: 7.2 10*3/uL (ref 4.0–10.5)
nRBC: 0 % (ref 0.0–0.2)

## 2020-01-27 LAB — VALPROIC ACID LEVEL: Valproic Acid Lvl: 98 ug/mL (ref 50.0–100.0)

## 2020-01-27 NOTE — ED Notes (Signed)
Attempted to call [facility] in chart (both numbers) for pt d/c with no response  Attempted to call [facility] directly with no response

## 2020-01-27 NOTE — ED Provider Notes (Signed)
[facility] Emergency Department Provider Note   ____________________________________________    I have reviewed the triage vital signs and the nursing notes.   HISTORY  Chief Complaint Seizures     HPI Russell Buchanan is a 44 y.o. male with a history of schizophrenia, seizure disorder who takes valproic acid presents after having a seizure.  Patient reports that he "usually "takes his medications."  He denies injury.  Denies tongue laceration.  Review of medical records demonstrates admission 1 month ago for seizures, no significant medication changes made at that time.  Patient has not followed up with neurology as directed.  Past Medical History:  Diagnosis Date  . Antisocial personality disorder (HCC)   . Bipolar disorder (HCC)   . Environmental and seasonal allergies   . GERD (gastroesophageal reflux disease)   . Headache   . Hyperlipidemia   . Infected elbow (HCC)   . Schizophrenia (HCC)   . Seizures (HCC)   . Tremors of nervous system     Patient Active Problem List   Diagnosis Date Noted  . Seizure (HCC) 01/10/2020  . AMS (altered mental status) 06/25/2019  . Bipolar disorder (HCC)   . Antisocial personality disorder (HCC)   . Acute encephalopathy 12/04/2016  . GERD (gastroesophageal reflux disease) 12/04/2016  . HLD (hyperlipidemia) 12/04/2016  . Schizophrenia (HCC) 11/15/2016  . Medication side effects 11/15/2016    Past Surgical History:  Procedure Laterality Date  . CLOSED REDUCTION WRIST FRACTURE Left   . HARDWARE REMOVAL Right 04/14/2018   Procedure: HARDWARE REMOVAL-RIGHT ELBOW;  Surgeon: Deeann Saint, MD;  Location: [facility] ORS;  Service: Orthopedics;  Laterality: Right;  . ORIF ELBOW FRACTURE Right   . SCALP LACERATION REPAIR    . WRIST RECONSTRUCTION Left     Prior to Admission medications   Medication Sig Start Date End Date Taking? Authorizing Provider  albuterol (PROVENTIL HFA;VENTOLIN HFA) 108 (90 Base)  MCG/ACT inhaler Inhale 2 puffs into the lungs every 6 (six) hours as needed for wheezing or shortness of breath. 01/30/17   Merrily Brittle, MD  benztropine (COGENTIN) 1 MG tablet Take 1 mg by mouth 3 (three) times daily.     [provider]  cetirizine (ZYRTEC) 10 MG tablet Take 10 mg by mouth daily.    [provider]  divalproex (DEPAKOTE) 500 MG DR tablet Take 1 tablet (500 mg total) by mouth every morning. 06/27/19 06/26/20  Darlin Priestly, MD  divalproex (DEPAKOTE) 500 MG DR tablet Take 2 tablets (1,000 mg total) by mouth at bedtime. 06/27/19   Darlin Priestly, MD  docusate sodium (COLACE) 100 MG capsule Take 100 mg by mouth daily.    [provider]  EPINEPHrine 0.3 mg/0.3 mL IJ SOAJ injection Inject 0.3 mg into the muscle as needed (allergic reactions).     [provider]  escitalopram (LEXAPRO) 20 MG tablet Take 20 mg by mouth daily.    [provider]  Fremanezumab-vfrm (AJOVY) 225 MG/1.5ML SOSY Inject into the skin every 28 (twenty-eight) days.    [provider]  furosemide (LASIX) 40 MG tablet Take 1 tablet (40 mg total) by mouth daily for 30 days. 06/25/18 01/10/20  Minna Antis, MD  gabapentin (NEURONTIN) 600 MG tablet Take 600 mg by mouth 3 (three) times daily.    [provider]  gemfibrozil (LOPID) 600 MG tablet Take 600 mg by mouth 2 (two) times daily before a meal.    [provider]  ketoconazole (NIZORAL) 2 % cream  Apply 1 application topically 2 (two) times daily.    [provider]  Lacosamide (VIMPAT) 100 MG TABS Take 100 mg by mouth 2 (two) times daily.    [provider]  linaclotide (LINZESS) 72 MCG capsule Take 72 mcg by mouth daily.     [provider]  lipase/protease/amylase (CREON) 36000 UNITS CPEP capsule Take 36,000 Units by mouth 3 (three) times daily with meals. 2 caps withmeals and 1 with snacks    [provider]  meloxicam (MOBIC) 15 MG tablet Take 1 tablet (15 mg  total) by mouth daily as needed for pain. 06/27/19   Darlin Priestly, MD  omeprazole (PRILOSEC) 40 MG capsule Take 40 mg by mouth 2 (two) times daily before a meal. 04/16/19   [provider]  ondansetron (ZOFRAN) 8 MG tablet Take 8 mg by mouth every 8 (eight) hours as needed for nausea. 06/16/19   [provider]  Paliperidone Palmitate ER (INVEGA TRINZA) 819 MG/2.625ML SUSY Inject into the muscle every 3 (three) months.    [provider]  polyethylene glycol (MIRALAX / GLYCOLAX) packet Take 17 g by mouth daily.    [provider]  potassium chloride SA (KLOR-CON) 20 MEQ tablet Take 20 mEq by mouth daily.    [provider]  pravastatin (PRAVACHOL) 40 MG tablet Take 40 mg by mouth every evening. 06/24/19   [provider]  propranolol ER (INDERAL LA) 160 MG SR capsule Take 160 mg by mouth daily.    [provider]  QUEtiapine (SEROQUEL) 100 MG tablet Take 100 mg by mouth at bedtime.  06/24/19   [provider]  simethicone (MYLICON) 80 MG chewable tablet Chew 80 mg by mouth 2 (two) times daily.    [provider]  SUMAtriptan (IMITREX) 100 MG tablet Take 100 mg by mouth every 2 (two) hours as needed for migraine. May repeat in 2 hours if headache persists or recurs.    [provider]  Tiotropium Bromide-Olodaterol (STIOLTO RESPIMAT IN) Inhale 2 puffs into the lungs daily.    [provider]     Allergies Bee venom  Family History  Family history unknown: Yes    Social History Social History   Tobacco Use  . Smoking status: Current Every Day Smoker    Packs/day: 0.50    Years: 7.00    Pack years: 3.50    Types: Cigarettes  . Smokeless tobacco: Former Neurosurgeon    Types: Chew, Snuff  Vaping Use  . Vaping Use: Never used  Substance Use Topics  . Alcohol use: No  . Drug use: Not Currently    Review of Systems  Constitutional: No fever/chills Eyes: No visual changes.  ENT: No sore  throat. Cardiovascular: Denies chest pain. Respiratory: Denies shortness of breath. Gastrointestinal: No abdominal pain.    Genitourinary: Negative for dysuria. Musculoskeletal: Negative for back pain. Skin: Negative for rash. Neurological: Negative for headaches    ____________________________________________   PHYSICAL EXAM:  VITAL SIGNS: ED Triage Vitals  Enc Vitals Group     BP 01/27/20 1321 116/70     Pulse Rate 01/27/20 1321 73     Resp 01/27/20 1321 16     Temp 01/27/20 1321 98.5 F (36.9 C)     Temp Source 01/27/20 1321 Oral     SpO2 01/27/20 1321 96 %     Weight 01/27/20 1316 104.3 kg (229 lb 15 oz)     Height 01/27/20 1316 1.803 m (5\' 11" )  Head Circumference --      Peak Flow --      Pain Score 01/27/20 1316 0     Pain Loc --      Pain Edu? --      Excl. in GC? --     Constitutional: Alert and oriented. No acute distress. Eyes: Conjunctivae are normal.  PERRLA, EOMI head: Atraumatic.  Neck:  Painless ROM Cardiovascular: Normal rate, regular rhythm. Grossly normal heart sounds.  Good peripheral circulation. Respiratory: Normal respiratory effort.  No retractions.  Gastrointestinal: Soft and nontender. No distention.  No CVA tenderness.  Musculoskeletal:  Warm and well perfused Neurologic:  Normal speech and language. No gross focal neurologic deficits are appreciated.  Skin:  Skin is warm, dry and intact. No rash noted. Psychiatric: Mood and affect are normal. Speech and behavior are normal.  ____________________________________________   LABS (all labs ordered are listed, but only abnormal results are displayed)  Labs Reviewed  VALPROIC ACID LEVEL  CBC  BASIC METABOLIC PANEL   ____________________________________________  EKG  ED ECG REPORT I, Jene Every, the attending physician, personally viewed and interpreted this ECG.  Date: 01/27/2020  Rhythm: normal sinus rhythm QRS Axis: normal Intervals: normal ST/T Wave abnormalities:  normal Narrative Interpretation: no evidence of acute ischemia  ____________________________________________  RADIOLOGY  None ____________________________________________   PROCEDURES  Procedure(s) performed: No  Procedures   Critical Care performed: No ____________________________________________   INITIAL IMPRESSION / ASSESSMENT AND PLAN / ED COURSE  Pertinent labs & imaging results that were available during my care of the patient were reviewed by me and considered in my medical decision making (see chart for details).  Patient presents for seizure.  Reportedly was postictal initially, now is more alert and oriented.  Overall well-appearing, he thinks that he may have missed some doses of valproic acid which may be the cause of his seizures.  Lab work today is overall reassuring, valproic acid level noted.  Observed in the emergency department with no further seizure activity, emphasized need for outpatient follow-up with neurology    ____________________________________________   FINAL CLINICAL IMPRESSION(S) / ED DIAGNOSES  Final diagnoses:  Seizure (HCC)        Note:  This document was prepared using Dragon voice recognition software and may include unintentional dictation errors.   Jene Every, MD 01/27/20 1435

## 2020-01-27 NOTE — ED Notes (Signed)
EDP, K[facility] to bedside. 

## 2020-01-27 NOTE — ED Notes (Signed)
Able to contact facility. Facility reports unable to transport, requests pt sent in cab

## 2020-01-27 NOTE — ED Triage Notes (Signed)
Arrived EMS after seizure like activity at group home.  CBG 96 with EMS, VSS with EMS.  Pt drowsy but opens eyes to voice and able to answer questions at this time. Pt reports falling on porch before seizure, per EMS group home staff only reported fall with seizure.

## 2020-02-01 ENCOUNTER — Other Ambulatory Visit: Payer: Self-pay | Admitting: Neurology

## 2020-02-01 DIAGNOSIS — R569 Unspecified convulsions: Secondary | ICD-10-CM

## 2020-02-16 ENCOUNTER — Other Ambulatory Visit: Payer: Self-pay

## 2020-02-16 ENCOUNTER — Emergency Department: Payer: Medicaid Other

## 2020-02-16 ENCOUNTER — Emergency Department
Admission: EM | Admit: 2020-02-16 | Discharge: 2020-02-16 | Disposition: A | Discharge status: Home / Self Care | Payer: Medicaid Other | Attending: Student in an Organized Health Care Education/Training Program | Admitting: Student in an Organized Health Care Education/Training Program

## 2020-02-16 DIAGNOSIS — F17210 Nicotine dependence, cigarettes, uncomplicated: Secondary | ICD-10-CM | POA: Diagnosis not present

## 2020-02-16 DIAGNOSIS — M25572 Pain in left ankle and joints of left foot: Secondary | ICD-10-CM | POA: Insufficient documentation

## 2020-02-16 DIAGNOSIS — R569 Unspecified convulsions: Secondary | ICD-10-CM | POA: Insufficient documentation

## 2020-02-16 DIAGNOSIS — R251 Tremor, unspecified: Secondary | ICD-10-CM

## 2020-02-16 LAB — VALPROIC ACID LEVEL: Valproic Acid Lvl: 59 ug/mL (ref 50.0–100.0)

## 2020-02-16 LAB — CBC
HCT: 44.9 % (ref 39.0–52.0)
Hemoglobin: 15.2 g/dL (ref 13.0–17.0)
MCH: 30 pg (ref 26.0–34.0)
MCHC: 33.9 g/dL (ref 30.0–36.0)
MCV: 88.6 fL (ref 80.0–100.0)
Platelets: 347 10*3/uL (ref 150–400)
RBC: 5.07 MIL/uL (ref 4.22–5.81)
RDW: 13.8 % (ref 11.5–15.5)
WBC: 8.3 10*3/uL (ref 4.0–10.5)
nRBC: 0 % (ref 0.0–0.2)

## 2020-02-16 LAB — BASIC METABOLIC PANEL
Anion gap: 11 (ref 5–15)
BUN: <5 mg/dL — ABNORMAL LOW (ref 6–20)
CO2: 28 mmol/L (ref 22–32)
Calcium: 9.2 mg/dL (ref 8.9–10.3)
Chloride: 101 mmol/L (ref 98–111)
Creatinine, Ser: 0.76 mg/dL (ref 0.61–1.24)
GFR calc Af Amer: >60 mL/min (ref >60)
GFR calc non Af Amer: >60 mL/min (ref >60)
Glucose, Bld: 106 mg/dL — ABNORMAL HIGH (ref 70–99)
Potassium: 4.1 mmol/L (ref 3.5–5.1)
Sodium: 140 mmol/L (ref 135–145)

## 2020-02-16 MED ORDER — DIVALPROEX SODIUM 500 MG PO DR TAB
1000.0000 mg | DELAYED_RELEASE_TABLET | Freq: Every day | ORAL | Status: DC
  Filled 2020-02-16: qty 2

## 2020-02-16 MED ORDER — LACOSAMIDE 50 MG PO TABS
100.0000 mg | ORAL_TABLET | Freq: Two times a day (BID) | ORAL | Status: DC
  Administered 2020-02-16: 100 mg via ORAL
  Filled 2020-02-16 (×3): qty 2

## 2020-02-16 NOTE — ED Triage Notes (Signed)
PT to ED via EMS from [facility]. Group home called out for seizure like activity. PT is shaking and slow to respond. Incontinent of brief previously. PT states this has happened before. PT oriented to self year and situation. Doe not answer all quesitons.

## 2020-02-16 NOTE — ED Provider Notes (Signed)
[facility] Emergency Department Provider Note    First MD Initiated Contact with Patient 02/16/20 1943     (approximate)  I have reviewed the triage vital signs and the nursing notes.   HISTORY  Chief Complaint Seizures    HPI Russell Buchanan is a 44 y.o. male   with the below listed past medical history presents to the ER for evaluation for shaking spell following rolling his left ankle.  Did not hit his head.  States he did not lose consciousness.  States that this was not a seizure but states he has frequent shaking spells and does have a history of tremors.  Has been compliant with his medications.  Denies any other discomfort.  States he like to be sent back to the group home.   Past Medical History:  Diagnosis Date  . Antisocial personality disorder (HCC)   . Bipolar disorder (HCC)   . Environmental and seasonal allergies   . GERD (gastroesophageal reflux disease)   . Headache   . Hyperlipidemia   . Infected elbow (HCC)   . Schizophrenia (HCC)   . Seizures (HCC)   . Tremors of nervous system    Family History  Family history unknown: Yes   Past Surgical History:  Procedure Laterality Date  . CLOSED REDUCTION WRIST FRACTURE Left   . HARDWARE REMOVAL Right 04/14/2018   Procedure: HARDWARE REMOVAL-RIGHT ELBOW;  Surgeon: Deeann Saint, MD;  Location: [facility] ORS;  Service: Orthopedics;  Laterality: Right;  . ORIF ELBOW FRACTURE Right   . SCALP LACERATION REPAIR    . WRIST RECONSTRUCTION Left    Patient Active Problem List   Diagnosis Date Noted  . Seizure (HCC) 01/10/2020  . AMS (altered mental status) 06/25/2019  . Bipolar disorder (HCC)   . Antisocial personality disorder (HCC)   . Acute encephalopathy 12/04/2016  . GERD (gastroesophageal reflux disease) 12/04/2016  . HLD (hyperlipidemia) 12/04/2016  . Schizophrenia (HCC) 11/15/2016  . Medication side effects 11/15/2016      Prior to Admission medications   Medication Sig Start  Date End Date Taking? Authorizing Provider  albuterol (PROVENTIL HFA;VENTOLIN HFA) 108 (90 Base) MCG/ACT inhaler Inhale 2 puffs into the lungs every 6 (six) hours as needed for wheezing or shortness of breath. 01/30/17   Merrily Brittle, MD  benztropine (COGENTIN) 1 MG tablet Take 1 mg by mouth 3 (three) times daily.     [provider]  cetirizine (ZYRTEC) 10 MG tablet Take 10 mg by mouth daily.    [provider]  divalproex (DEPAKOTE) 500 MG DR tablet Take 1 tablet (500 mg total) by mouth every morning. 06/27/19 06/26/20  Darlin Priestly, MD  divalproex (DEPAKOTE) 500 MG DR tablet Take 2 tablets (1,000 mg total) by mouth at bedtime. 06/27/19   Darlin Priestly, MD  docusate sodium (COLACE) 100 MG capsule Take 100 mg by mouth daily.    [provider]  EPINEPHrine 0.3 mg/0.3 mL IJ SOAJ injection Inject 0.3 mg into the muscle as needed (allergic reactions).     [provider]  escitalopram (LEXAPRO) 20 MG tablet Take 20 mg by mouth daily.    [provider]  Fremanezumab-vfrm (AJOVY) 225 MG/1.5ML SOSY Inject into the skin every 28 (twenty-eight) days.    [provider]  furosemide (LASIX) 40 MG tablet Take 1 tablet (40 mg total) by mouth daily for 30 days. 06/25/18 01/10/20  Minna Antis, MD  gabapentin (NEURONTIN) 600 MG tablet Take 600 mg by mouth 3 (three)  times daily.    [provider]  gemfibrozil (LOPID) 600 MG tablet Take 600 mg by mouth 2 (two) times daily before a meal.    [provider]  ketoconazole (NIZORAL) 2 % cream Apply 1 application topically 2 (two) times daily.    [provider]  Lacosamide (VIMPAT) 100 MG TABS Take 100 mg by mouth 2 (two) times daily.    [provider]  linaclotide (LINZESS) 72 MCG capsule Take 72 mcg by mouth daily.     [provider]  lipase/protease/amylase (CREON) 36000 UNITS CPEP capsule Take 36,000 Units by mouth 3 (three) times daily with meals. 2 caps withmeals and  1 with snacks    [provider]  meloxicam (MOBIC) 15 MG tablet Take 1 tablet (15 mg total) by mouth daily as needed for pain. 06/27/19   Darlin Priestly, MD  omeprazole (PRILOSEC) 40 MG capsule Take 40 mg by mouth 2 (two) times daily before a meal. 04/16/19   [provider]  ondansetron (ZOFRAN) 8 MG tablet Take 8 mg by mouth every 8 (eight) hours as needed for nausea. 06/16/19   [provider]  Paliperidone Palmitate ER (INVEGA TRINZA) 819 MG/2.625ML SUSY Inject into the muscle every 3 (three) months.    [provider]  polyethylene glycol (MIRALAX / GLYCOLAX) packet Take 17 g by mouth daily.    [provider]  potassium chloride SA (KLOR-CON) 20 MEQ tablet Take 20 mEq by mouth daily.    [provider]  pravastatin (PRAVACHOL) 40 MG tablet Take 40 mg by mouth every evening. 06/24/19   [provider]  propranolol ER (INDERAL LA) 160 MG SR capsule Take 160 mg by mouth daily.    [provider]  QUEtiapine (SEROQUEL) 100 MG tablet Take 100 mg by mouth at bedtime.  06/24/19   [provider]  simethicone (MYLICON) 80 MG chewable tablet Chew 80 mg by mouth 2 (two) times daily.    [provider]  SUMAtriptan (IMITREX) 100 MG tablet Take 100 mg by mouth every 2 (two) hours as needed for migraine. May repeat in 2 hours if headache persists or recurs.    [provider]  Tiotropium Bromide-Olodaterol (STIOLTO RESPIMAT IN) Inhale 2 puffs into the lungs daily.    [provider]    Allergies Bee venom    Social History Social History   Tobacco Use  . Smoking status: Current Every Day Smoker    Packs/day: 0.50    Years: 7.00    Pack years: 3.50    Types: Cigarettes  . Smokeless tobacco: Former Neurosurgeon    Types: Chew, Snuff  Vaping Use  . Vaping Use: Never used  Substance Use Topics  . Alcohol use: No  . Drug use: Not Currently    Review of Systems Patient denies headaches, rhinorrhea,  blurry vision, numbness, shortness of breath, chest pain, edema, cough, abdominal pain, nausea, vomiting, diarrhea, dysuria, fevers, rashes or hallucinations unless otherwise stated above in HPI. ____________________________________________   PHYSICAL EXAM:  VITAL SIGNS: Vitals:   02/16/20 1840  BP: 129/84  Pulse: 64  Resp: 18  SpO2: 100%    Constitutional: Alert and oriented.  Eyes: Conjunctivae are normal.  Head: Atraumatic. Nose: No congestion/rhinnorhea. Mouth/Throat: Mucous membranes are moist.   Neck: No stridor. Painless ROM.  Cardiovascular: Normal rate, regular rhythm. Grossly normal heart sounds.  Good peripheral circulation. Respiratory: Normal respiratory effort.  No retractions. Lungs CTAB. Gastrointestinal: Soft and nontender. No distention. No  abdominal bruits. No CVA tenderness. Genitourinary:  Musculoskeletal: NV intact distally,  Mild ttp of left lateral mal.  No joint effusions. Neurologic:  Normal speech and language. No gross focal neurologic deficits are appreciated. No facial droop Skin:  Skin is warm, dry and intact. No rash noted. Psychiatric: odd, but calm and cooperative. Speech and behavior are normal.  ____________________________________________   LABS (all labs ordered are listed, but only abnormal results are displayed)  Results for orders placed or performed during the hospital encounter of 02/16/20 (from the past 24 hour(s))  Basic metabolic panel - if new onset seizures     Status: Abnormal   Collection Time: 02/16/20  6:52 PM  Result Value Ref Range   Sodium 140 135 - 145 mmol/L   Potassium 4.1 3.5 - 5.1 mmol/L   Chloride 101 98 - 111 mmol/L   CO2 28 22 - 32 mmol/L   Glucose, Bld 106 (H) 70 - 99 mg/dL   BUN <5 (L) 6 - 20 mg/dL   Creatinine, Ser 1.61 0.61 - 1.24 mg/dL   Calcium 9.2 8.9 - 09.6 mg/dL   GFR calc non Af Amer >60 >60 mL/min   GFR calc Af Amer >60 >60 mL/min   Anion gap 11 5 - 15  CBC - if new onset seizures     Status:  None   Collection Time: 02/16/20  6:52 PM  Result Value Ref Range   WBC 8.3 4.0 - 10.5 K/uL   RBC 5.07 4.22 - 5.81 MIL/uL   Hemoglobin 15.2 13.0 - 17.0 g/dL   HCT 04.5 39 - 52 %   MCV 88.6 80.0 - 100.0 fL   MCH 30.0 26.0 - 34.0 pg   MCHC 33.9 30.0 - 36.0 g/dL   RDW 40.9 81.1 - 91.4 %   Platelets 347 150 - 400 K/uL   nRBC 0.0 0.0 - 0.2 %  Valproic acid level     Status: None   Collection Time: 02/16/20  6:52 PM  Result Value Ref Range   Valproic Acid Lvl 59 50.0 - 100.0 ug/mL   ____________________________________________  ____________________________________________  RADIOLOGY  I personally reviewed all radiographic images ordered to evaluate for the above acute complaints and reviewed radiology reports and findings.  These findings were personally discussed with the patient.  Please see medical record for radiology report.  ____________________________________________   PROCEDURES  Procedure(s) performed:  Procedures    Critical Care performed: no ____________________________________________   INITIAL IMPRESSION / ASSESSMENT AND PLAN / ED COURSE  Pertinent labs & imaging results that were available during my care of the patient were reviewed by me and considered in my medical decision making (see chart for details).   DDX: fracture, contusion, sprain, tremor, seizure, medication noncompliance  Malcomb Gangemi is a 44 y.o. who presents to the ED with presentation as described above.  Patient with reassuring exam.  X-ray ordered for the but differential is reassuring.  Likely contusion.  Less likely brain or ligamentous injury.  No indication for emergent neuroimaging.  Blood work is reassuring.  Patient appropriate for outpatient follow-up.    The patient was evaluated in Emergency Department today for the symptoms described in the history of present illness. He/she was evaluated in the context of the global COVID-19 pandemic, which necessitated consideration that the  patient might be at risk for infection with the SARS-CoV-2 virus that causes COVID-19. Institutional protocols and algorithms that pertain to the evaluation of patients at risk for COVID-19 are in a state  of rapid change based on information released by regulatory bodies including the CDC and federal and state organizations. These policies and algorithms were followed during the patient's care in the ED.  As part of my medical decision making, I reviewed the following data within the electronic MEDICAL RECORD NUMBER Nursing notes reviewed and incorporated, Labs reviewed, notes from prior ED visits and [place] Controlled Substance Database   ____________________________________________   FINAL CLINICAL IMPRESSION(S) / ED DIAGNOSES  Final diagnoses:  Episode of shaking  Acute left ankle pain      NEW MEDICATIONS STARTED DURING THIS VISIT:  New Prescriptions   No medications on file     Note:  This document was prepared using Dragon voice recognition software and may include unintentional dictation errors.    Willy Eddy, MD 02/16/20 2000

## 2020-02-16 NOTE — ED Notes (Addendum)
Spoke with [facility] owner Rowan Blase Ray [phone]) who st she is out of town and has no one to come and get pt; explained to her that pt has not been evaluated by a provider; owner st that she arranged for the local taxi company to "cover her"; informed her that no one has answered and that because the pt has not been evaluated that it would not be in his best interest to just let him leave without a caregiver; owner cont to st that there is no one to handle this and insists that EMS be called; explained that EMS is not a transport service and again pt has not been seen by a provider and needs a caregiver with him as it is there responsibility; owner then hangs up on this nurse; charge nurse notified

## 2020-02-22 ENCOUNTER — Ambulatory Visit
Admission: RE | Admit: 2020-02-22 | Discharge: 2020-02-22 | Disposition: A | Discharge status: Home / Self Care | Payer: Medicaid Other | Source: Ambulatory Visit | Attending: Neurology | Admitting: Neurology

## 2020-02-22 ENCOUNTER — Other Ambulatory Visit: Payer: Self-pay

## 2020-02-22 DIAGNOSIS — R569 Unspecified convulsions: Secondary | ICD-10-CM | POA: Insufficient documentation

## 2020-02-22 MED ORDER — GADOBUTROL 1 MMOL/ML IV SOLN
10.0000 mL | Freq: Once | INTRAVENOUS | Status: AC | PRN
  Administered 2020-02-22: 10 mL via INTRAVENOUS

## 2020-02-23 ENCOUNTER — Emergency Department
Admission: EM | Admit: 2020-02-23 | Discharge: 2020-02-23 | Disposition: A | Discharge status: Home / Self Care | Payer: Medicaid Other | Attending: Student in an Organized Health Care Education/Training Program | Admitting: Student in an Organized Health Care Education/Training Program

## 2020-02-23 ENCOUNTER — Other Ambulatory Visit: Payer: Self-pay

## 2020-02-23 DIAGNOSIS — Z79899 Other long term (current) drug therapy: Secondary | ICD-10-CM | POA: Diagnosis not present

## 2020-02-23 DIAGNOSIS — F17210 Nicotine dependence, cigarettes, uncomplicated: Secondary | ICD-10-CM | POA: Diagnosis not present

## 2020-02-23 DIAGNOSIS — Z0000 Encounter for general adult medical examination without abnormal findings: Secondary | ICD-10-CM | POA: Diagnosis not present

## 2020-02-23 DIAGNOSIS — R531 Weakness: Secondary | ICD-10-CM | POA: Diagnosis present

## 2020-02-23 LAB — CBC
HCT: 46.3 % (ref 39.0–52.0)
Hemoglobin: 16 g/dL (ref 13.0–17.0)
MCH: 30.5 pg (ref 26.0–34.0)
MCHC: 34.6 g/dL (ref 30.0–36.0)
MCV: 88.4 fL (ref 80.0–100.0)
Platelets: 216 10*3/uL (ref 150–400)
RBC: 5.24 MIL/uL (ref 4.22–5.81)
RDW: 13.8 % (ref 11.5–15.5)
WBC: 10.1 10*3/uL (ref 4.0–10.5)
nRBC: 0 % (ref 0.0–0.2)

## 2020-02-23 LAB — BASIC METABOLIC PANEL
Anion gap: 11 (ref 5–15)
BUN: 12 mg/dL (ref 6–20)
CO2: 26 mmol/L (ref 22–32)
Calcium: 9.5 mg/dL (ref 8.9–10.3)
Chloride: 100 mmol/L (ref 98–111)
Creatinine, Ser: 0.94 mg/dL (ref 0.61–1.24)
GFR calc Af Amer: >60 mL/min (ref >60)
GFR calc non Af Amer: >60 mL/min (ref >60)
Glucose, Bld: 99 mg/dL (ref 70–99)
Potassium: 4.7 mmol/L (ref 3.5–5.1)
Sodium: 137 mmol/L (ref 135–145)

## 2020-02-23 NOTE — ED Triage Notes (Signed)
Pt comes into the ED via EMS from [facility] group home with complaints from the pt of generalized weakness, increased urination, pt is soled with urine on arrival , pt is a/ox3 at present. States he has been unable to ambulate today.

## 2020-02-23 NOTE — ED Notes (Signed)
Pt attempting to get up from the wheelchair that is located beside the charge nurse desk, states he wants to go outside to smoke a cigarette, pt informed there is no smoking aloud on the premises, pt understands

## 2020-02-23 NOTE — ED Provider Notes (Signed)
[facility] Emergency Department Provider Note    First MD Initiated Contact with Patient 02/23/20 1323     (approximate)  I have reviewed the triage vital signs and the nursing notes.   HISTORY  Chief Complaint Weakness    HPI Zyshonne Malecha is a 44 y.o. male the below listed past medical history presents to ER from group home due to feel like he is peeing more frequently.  Denies any pain.  States that he feels weak but this is a chronic issue for him and does not feel more weak than usual.  Also complaining of left ankle pain which he has had for several months.  Denies any other pain.  No shortness of breath or chest discomfort.    Past Medical History:  Diagnosis Date  . Antisocial personality disorder (HCC)   . Bipolar disorder (HCC)   . Environmental and seasonal allergies   . GERD (gastroesophageal reflux disease)   . Headache   . Hyperlipidemia   . Infected elbow (HCC)   . Schizophrenia (HCC)   . Seizures (HCC)   . Tremors of nervous system    Family History  Family history unknown: Yes   Past Surgical History:  Procedure Laterality Date  . CLOSED REDUCTION WRIST FRACTURE Left   . HARDWARE REMOVAL Right 04/14/2018   Procedure: HARDWARE REMOVAL-RIGHT ELBOW;  Surgeon: Deeann Saint, MD;  Location: [facility] ORS;  Service: Orthopedics;  Laterality: Right;  . ORIF ELBOW FRACTURE Right   . SCALP LACERATION REPAIR    . WRIST RECONSTRUCTION Left    Patient Active Problem List   Diagnosis Date Noted  . Seizure (HCC) 01/10/2020  . AMS (altered mental status) 06/25/2019  . Bipolar disorder (HCC)   . Antisocial personality disorder (HCC)   . Acute encephalopathy 12/04/2016  . GERD (gastroesophageal reflux disease) 12/04/2016  . HLD (hyperlipidemia) 12/04/2016  . Schizophrenia (HCC) 11/15/2016  . Medication side effects 11/15/2016      Prior to Admission medications   Medication Sig Start Date End Date Taking? Authorizing Provider    albuterol (PROVENTIL HFA;VENTOLIN HFA) 108 (90 Base) MCG/ACT inhaler Inhale 2 puffs into the lungs every 6 (six) hours as needed for wheezing or shortness of breath. 01/30/17   Merrily Brittle, MD  benztropine (COGENTIN) 1 MG tablet Take 1 mg by mouth 3 (three) times daily.     [provider]  cetirizine (ZYRTEC) 10 MG tablet Take 10 mg by mouth daily.    [provider]  divalproex (DEPAKOTE) 500 MG DR tablet Take 1 tablet (500 mg total) by mouth every morning. 06/27/19 06/26/20  Darlin Priestly, MD  divalproex (DEPAKOTE) 500 MG DR tablet Take 2 tablets (1,000 mg total) by mouth at bedtime. 06/27/19   Darlin Priestly, MD  docusate sodium (COLACE) 100 MG capsule Take 100 mg by mouth daily.    [provider]  EPINEPHrine 0.3 mg/0.3 mL IJ SOAJ injection Inject 0.3 mg into the muscle as needed (allergic reactions).     [provider]  escitalopram (LEXAPRO) 20 MG tablet Take 20 mg by mouth daily.    [provider]  Fremanezumab-vfrm (AJOVY) 225 MG/1.5ML SOSY Inject into the skin every 28 (twenty-eight) days.    [provider]  furosemide (LASIX) 40 MG tablet Take 1 tablet (40 mg total) by mouth daily for 30 days. 06/25/18 01/10/20  Minna Antis, MD  gabapentin (NEURONTIN) 600 MG tablet Take 600 mg by mouth 3 (three) times daily.    [provider]  gemfibrozil (LOPID) 600 MG tablet Take 600 mg by mouth 2 (two) times daily before a meal.    [provider]  ketoconazole (NIZORAL) 2 % cream Apply 1 application topically 2 (two) times daily.    [provider]  Lacosamide (VIMPAT) 100 MG TABS Take 100 mg by mouth 2 (two) times daily.    [provider]  linaclotide (LINZESS) 72 MCG capsule Take 72 mcg by mouth daily.     [provider]  lipase/protease/amylase (CREON) 36000 UNITS CPEP capsule Take 36,000 Units by mouth 3 (three) times daily with meals. 2 caps withmeals and 1 with snacks    [provider]   meloxicam (MOBIC) 15 MG tablet Take 1 tablet (15 mg total) by mouth daily as needed for pain. 06/27/19   Darlin Priestly, MD  omeprazole (PRILOSEC) 40 MG capsule Take 40 mg by mouth 2 (two) times daily before a meal. 04/16/19   [provider]  ondansetron (ZOFRAN) 8 MG tablet Take 8 mg by mouth every 8 (eight) hours as needed for nausea. 06/16/19   [provider]  Paliperidone Palmitate ER (INVEGA TRINZA) 819 MG/2.625ML SUSY Inject into the muscle every 3 (three) months.    [provider]  polyethylene glycol (MIRALAX / GLYCOLAX) packet Take 17 g by mouth daily.    [provider]  potassium chloride SA (KLOR-CON) 20 MEQ tablet Take 20 mEq by mouth daily.    [provider]  pravastatin (PRAVACHOL) 40 MG tablet Take 40 mg by mouth every evening. 06/24/19   [provider]  propranolol ER (INDERAL LA) 160 MG SR capsule Take 160 mg by mouth daily.    [provider]  QUEtiapine (SEROQUEL) 100 MG tablet Take 100 mg by mouth at bedtime.  06/24/19   [provider]  simethicone (MYLICON) 80 MG chewable tablet Chew 80 mg by mouth 2 (two) times daily.    [provider]  SUMAtriptan (IMITREX) 100 MG tablet Take 100 mg by mouth every 2 (two) hours as needed for migraine. May repeat in 2 hours if headache persists or recurs.    [provider]  Tiotropium Bromide-Olodaterol (STIOLTO RESPIMAT IN) Inhale 2 puffs into the lungs daily.    [provider]    Allergies Bee venom    Social History Social History   Tobacco Use  . Smoking status: Current Every Day Smoker    Packs/day: 0.50    Years: 7.00    Pack years: 3.50    Types: Cigarettes  . Smokeless tobacco: Former Neurosurgeon    Types: Chew, Snuff  Vaping Use  . Vaping Use: Never used  Substance Use Topics  . Alcohol use: No  . Drug use: Not Currently    Review of Systems Patient denies headaches, rhinorrhea, blurry vision, numbness, shortness of  breath, chest pain, edema, cough, abdominal pain, nausea, vomiting, diarrhea, dysuria, fevers, rashes or hallucinations unless otherwise stated above in HPI. ____________________________________________   PHYSICAL EXAM:  VITAL SIGNS: Vitals:   02/23/20 1059 02/23/20 1103  BP: 117/85   Pulse: 80   Resp: 18   Temp:  98.6 F (37 C)  SpO2: 97%     Constitutional: Alert, in nad Eyes: Conjunctivae are normal.  Head: Atraumatic. Nose: No congestion/rhinnorhea. Mouth/Throat: Mucous membranes are moist.   Neck: No stridor. Painless ROM.  Cardiovascular: Normal rate, regular rhythm. Grossly normal heart sounds.  Good peripheral circulation. Respiratory: Normal respiratory effort.  No retractions. Lungs CTAB. Gastrointestinal:  Soft and nontender. No distention. No abdominal bruits. No CVA tenderness. Genitourinary: deferred Musculoskeletal: No lower extremity tenderness nor edema.  No joint effusions. Neurologic:   No gross focal neurologic deficits are appreciated. No facial droop Skin:  Skin is warm, dry and intact. No rash noted. Psychiatric calm  ____________________________________________   LABS (all labs ordered are listed, but only abnormal results are displayed)  Results for orders placed or performed during the hospital encounter of 02/23/20 (from the past 24 hour(s))  Basic metabolic panel     Status: None   Collection Time: 02/23/20 11:13 AM  Result Value Ref Range   Sodium 137 135 - 145 mmol/L   Potassium 4.7 3.5 - 5.1 mmol/L   Chloride 100 98 - 111 mmol/L   CO2 26 22 - 32 mmol/L   Glucose, Bld 99 70 - 99 mg/dL   BUN 12 6 - 20 mg/dL   Creatinine, Ser 6.57 0.61 - 1.24 mg/dL   Calcium 9.5 8.9 - 84.6 mg/dL   GFR calc non Af Amer >60 >60 mL/min   GFR calc Af Amer >60 >60 mL/min   Anion gap 11 5 - 15  CBC     Status: None   Collection Time: 02/23/20 11:13 AM  Result Value Ref Range   WBC 10.1 4.0 - 10.5 K/uL   RBC 5.24 4.22 - 5.81 MIL/uL   Hemoglobin 16.0 13.0 -  17.0 g/dL   HCT 96.2 39 - 52 %   MCV 88.4 80.0 - 100.0 fL   MCH 30.5 26.0 - 34.0 pg   MCHC 34.6 30.0 - 36.0 g/dL   RDW 95.2 84.1 - 32.4 %   Platelets 216 150 - 400 K/uL   nRBC 0.0 0.0 - 0.2 %   ____________________________________________  EKG My review and personal interpretation at Time: 11:20   Indication: med eval  Rate: 85  Rhythm: sinus Axis: normal Other: normal intervlas, no stemi ____________________________________________  RADIOLOGY   ____________________________________________   PROCEDURES  Procedure(s) performed:  Procedures    Critical Care performed: no ____________________________________________   INITIAL IMPRESSION / ASSESSMENT AND PLAN / ED COURSE  Pertinent labs & imaging results that were available during my care of the patient were reviewed by me and considered in my medical decision making (see chart for details).   DDX: Cystitis, electrolyte abnormality, medication noncompliance  Nysir Fergusson is a 44 y.o. who presents to the ED with symptoms as described above.  Patient denying any complaints at this time other than chronic ankle pain which was evaluated last time.  No report of interval falls.  Neuro exam is reassuring.  He clinically appears very well compared to previous visits.  Blood work is reassuring patient refusing to provide any any urine as he states that he feels fine and wants to be discharged.  Patient cleared for outpatient follow-up     The patient was evaluated in Emergency Department today for the symptoms described in the history of present illness. He/she was evaluated in the context of the global COVID-19 pandemic, which necessitated consideration that the patient might be at risk for infection with the SARS-CoV-2 virus that causes COVID-19. Institutional protocols and algorithms that pertain to the evaluation of patients at risk for COVID-19 are in a state of rapid change based on information released by regulatory bodies  including the CDC and federal and state organizations. These policies and algorithms were followed during the patient's care in the ED.  As part of my medical decision making, I  reviewed the following data within the electronic MEDICAL RECORD NUMBER Nursing notes reviewed and incorporated, Labs reviewed, notes from prior ED visits and [place] Controlled Substance Database   ____________________________________________   FINAL CLINICAL IMPRESSION(S) / ED DIAGNOSES  Final diagnoses:  Well adult health check      NEW MEDICATIONS STARTED DURING THIS VISIT:  New Prescriptions   No medications on file     Note:  This document was prepared using Dragon voice recognition software and may include unintentional dictation errors.    Willy Eddy, MD 02/23/20 [id number]

## 2020-02-23 NOTE — ED Notes (Signed)
Pt states weakness today and unable to ambulate

## 2020-02-23 NOTE — ED Notes (Signed)
Pt refusing urine collection to run for test. Pt states he wants to go home. MD informed

## 2020-02-23 NOTE — ED Notes (Signed)
Called Russell Buchanan owner of group home [facility]. She stated pt is able to take a taxi cab and she has an account with them so they can bill them.

## 2020-03-05 ENCOUNTER — Other Ambulatory Visit: Payer: Self-pay

## 2020-03-05 ENCOUNTER — Emergency Department: Payer: Medicaid Other

## 2020-03-05 DIAGNOSIS — F17210 Nicotine dependence, cigarettes, uncomplicated: Secondary | ICD-10-CM | POA: Insufficient documentation

## 2020-03-05 DIAGNOSIS — R55 Syncope and collapse: Secondary | ICD-10-CM | POA: Insufficient documentation

## 2020-03-05 LAB — BASIC METABOLIC PANEL
Anion gap: 9 (ref 5–15)
BUN: 6 mg/dL (ref 6–20)
CO2: 27 mmol/L (ref 22–32)
Calcium: 8.7 mg/dL — ABNORMAL LOW (ref 8.9–10.3)
Chloride: 104 mmol/L (ref 98–111)
Creatinine, Ser: 0.75 mg/dL (ref 0.61–1.24)
GFR calc Af Amer: >60 mL/min (ref >60)
GFR calc non Af Amer: >60 mL/min (ref >60)
Glucose, Bld: 100 mg/dL — ABNORMAL HIGH (ref 70–99)
Potassium: 4.2 mmol/L (ref 3.5–5.1)
Sodium: 140 mmol/L (ref 135–145)

## 2020-03-05 LAB — CBC
HCT: 39.9 % (ref 39.0–52.0)
Hemoglobin: 13.9 g/dL (ref 13.0–17.0)
MCH: 30.5 pg (ref 26.0–34.0)
MCHC: 34.8 g/dL (ref 30.0–36.0)
MCV: 87.5 fL (ref 80.0–100.0)
Platelets: 192 10*3/uL (ref 150–400)
RBC: 4.56 MIL/uL (ref 4.22–5.81)
RDW: 14 % (ref 11.5–15.5)
WBC: 5.4 10*3/uL (ref 4.0–10.5)
nRBC: 0 % (ref 0.0–0.2)

## 2020-03-05 LAB — TROPONIN I (HIGH SENSITIVITY)
Troponin I (High Sensitivity): 4 ng/L (ref <18)
Troponin I (High Sensitivity): 8 ng/L (ref <18)

## 2020-03-05 NOTE — ED Triage Notes (Signed)
Patient waiting outside for triage. Patient brought to triage room by this RN - placed into wheel chair due to unsteady gait.   Patient c/o syncopal episode, fall, and tremor.

## 2020-03-05 NOTE — ED Notes (Signed)
No answer when called for triage, pt not in restroom.

## 2020-03-06 ENCOUNTER — Emergency Department
Admission: EM | Admit: 2020-03-06 | Discharge: 2020-03-06 | Disposition: A | Discharge status: Home / Self Care | Payer: Medicaid Other | Attending: Emergency Medicine | Admitting: Emergency Medicine

## 2020-03-06 DIAGNOSIS — R55 Syncope and collapse: Secondary | ICD-10-CM

## 2020-03-06 LAB — VALPROIC ACID LEVEL: Valproic Acid Lvl: 58 ug/mL (ref 50.0–100.0)

## 2020-03-06 MED ORDER — SODIUM CHLORIDE 0.9 % IV BOLUS
1000.0000 mL | Freq: Once | INTRAVENOUS | Status: AC
  Administered 2020-03-06: 1000 mL via INTRAVENOUS

## 2020-03-06 MED ORDER — LACOSAMIDE 50 MG PO TABS
100.0000 mg | ORAL_TABLET | Freq: Once | ORAL | Status: AC
  Administered 2020-03-06: 100 mg via ORAL
  Filled 2020-03-06: qty 2

## 2020-03-06 NOTE — Discharge Instructions (Addendum)
Drink plenty fluids daily.  Return to the ER for worsening symptoms, persistent vomiting, difficulty breathing or other concerns.

## 2020-03-06 NOTE — ED Notes (Signed)
Contacted [name] at  [facility]. She stated that they have an account with [name] and we could send the patient home using the account for [name] who is the group home [employer] and [employer]. [name] contacted and gave an ETA of approx 0540.

## 2020-03-06 NOTE — ED Notes (Signed)
Pt given grape juice per request to produce urine specimen.

## 2020-03-06 NOTE — ED Notes (Addendum)
Attempted to call both mobile and home numbers for [facility] with no answer with full mailbox, unable to take new messages. Charge nurse notified.

## 2020-03-06 NOTE — ED Notes (Signed)
Pt removed iv while attempted to use restroom.

## 2020-03-06 NOTE — ED Provider Notes (Signed)
[facility] Emergency Department Provider Note   ____________________________________________   First MD Initiated Contact with Patient 03/06/20 0025     (approximate)  I have reviewed the triage vital signs and the nursing notes.   HISTORY  Chief Complaint Loss of Consciousness    HPI Russell Buchanan is a 44 y.o. male brought to the ED from group home with a chief complaint of syncope.  Patient has a history of antisocial personality disorder, bipolar disorder, schizophrenia, seizures and tremors.  Patient reports syncopal episode yesterday morning.  Feeling better since.  Denies fever, cough, chest pain, shortness of breath, abdominal pain, nausea, vomiting or dizziness.     Past Medical History:  Diagnosis Date  . Antisocial personality disorder (HCC)   . Bipolar disorder (HCC)   . Environmental and seasonal allergies   . GERD (gastroesophageal reflux disease)   . Headache   . Hyperlipidemia   . Infected elbow (HCC)   . Schizophrenia (HCC)   . Seizures (HCC)   . Tremors of nervous system     Patient Active Problem List   Diagnosis Date Noted  . Seizure (HCC) 01/10/2020  . AMS (altered mental status) 06/25/2019  . Bipolar disorder (HCC)   . Antisocial personality disorder (HCC)   . Acute encephalopathy 12/04/2016  . GERD (gastroesophageal reflux disease) 12/04/2016  . HLD (hyperlipidemia) 12/04/2016  . Schizophrenia (HCC) 11/15/2016  . Medication side effects 11/15/2016    Past Surgical History:  Procedure Laterality Date  . CLOSED REDUCTION WRIST FRACTURE Left   . HARDWARE REMOVAL Right 04/14/2018   Procedure: HARDWARE REMOVAL-RIGHT ELBOW;  Surgeon: Deeann Saint, MD;  Location: [facility] ORS;  Service: Orthopedics;  Laterality: Right;  . ORIF ELBOW FRACTURE Right   . SCALP LACERATION REPAIR    . WRIST RECONSTRUCTION Left     Prior to Admission medications   Medication Sig Start Date End Date Taking? Authorizing Provider  albuterol  (PROVENTIL HFA;VENTOLIN HFA) 108 (90 Base) MCG/ACT inhaler Inhale 2 puffs into the lungs every 6 (six) hours as needed for wheezing or shortness of breath. 01/30/17   Merrily Brittle, MD  benztropine (COGENTIN) 1 MG tablet Take 1 mg by mouth 3 (three) times daily.     [provider]  cetirizine (ZYRTEC) 10 MG tablet Take 10 mg by mouth daily.    [provider]  divalproex (DEPAKOTE) 500 MG DR tablet Take 1 tablet (500 mg total) by mouth every morning. 06/27/19 06/26/20  Darlin Priestly, MD  divalproex (DEPAKOTE) 500 MG DR tablet Take 2 tablets (1,000 mg total) by mouth at bedtime. 06/27/19   Darlin Priestly, MD  docusate sodium (COLACE) 100 MG capsule Take 100 mg by mouth daily.    [provider]  EPINEPHrine 0.3 mg/0.3 mL IJ SOAJ injection Inject 0.3 mg into the muscle as needed (allergic reactions).     [provider]  escitalopram (LEXAPRO) 20 MG tablet Take 20 mg by mouth daily.    [provider]  Fremanezumab-vfrm (AJOVY) 225 MG/1.5ML SOSY Inject into the skin every 28 (twenty-eight) days.    [provider]  furosemide (LASIX) 40 MG tablet Take 1 tablet (40 mg total) by mouth daily for 30 days. 06/25/18 01/10/20  Minna Antis, MD  gabapentin (NEURONTIN) 600 MG tablet Take 600 mg by mouth 3 (three) times daily.    [provider]  gemfibrozil (LOPID) 600 MG tablet Take 600 mg by mouth 2 (two) times daily before a meal.    [provider]  ketoconazole (NIZORAL) 2 % cream Apply 1 application topically 2 (two) times daily.    [provider]  Lacosamide (VIMPAT) 100 MG TABS Take 100 mg by mouth 2 (two) times daily.    [provider]  linaclotide (LINZESS) 72 MCG capsule Take 72 mcg by mouth daily.     [provider]  lipase/protease/amylase (CREON) 36000 UNITS CPEP capsule Take 36,000 Units by mouth 3 (three) times daily with meals. 2 caps withmeals and 1 with snacks    [provider]  meloxicam  (MOBIC) 15 MG tablet Take 1 tablet (15 mg total) by mouth daily as needed for pain. 06/27/19   Darlin Priestly, MD  omeprazole (PRILOSEC) 40 MG capsule Take 40 mg by mouth 2 (two) times daily before a meal. 04/16/19   [provider]  ondansetron (ZOFRAN) 8 MG tablet Take 8 mg by mouth every 8 (eight) hours as needed for nausea. 06/16/19   [provider]  Paliperidone Palmitate ER (INVEGA TRINZA) 819 MG/2.625ML SUSY Inject into the muscle every 3 (three) months.    [provider]  polyethylene glycol (MIRALAX / GLYCOLAX) packet Take 17 g by mouth daily.    [provider]  potassium chloride SA (KLOR-CON) 20 MEQ tablet Take 20 mEq by mouth daily.    [provider]  pravastatin (PRAVACHOL) 40 MG tablet Take 40 mg by mouth every evening. 06/24/19   [provider]  propranolol ER (INDERAL LA) 160 MG SR capsule Take 160 mg by mouth daily.    [provider]  QUEtiapine (SEROQUEL) 100 MG tablet Take 100 mg by mouth at bedtime.  06/24/19   [provider]  simethicone (MYLICON) 80 MG chewable tablet Chew 80 mg by mouth 2 (two) times daily.    [provider]  SUMAtriptan (IMITREX) 100 MG tablet Take 100 mg by mouth every 2 (two) hours as needed for migraine. May repeat in 2 hours if headache persists or recurs.    [provider]  Tiotropium Bromide-Olodaterol (STIOLTO RESPIMAT IN) Inhale 2 puffs into the lungs daily.    [provider]    Allergies Bee venom  Family History  Family history unknown: Yes    Social History Social History   Tobacco Use  . Smoking status: Current Every Day Smoker    Packs/day: 0.50    Years: 7.00    Pack years: 3.50    Types: Cigarettes  . Smokeless tobacco: Former Neurosurgeon    Types: Chew, Snuff  Vaping Use  . Vaping Use: Never used  Substance Use Topics  . Alcohol use: No  . Drug use: Not Currently    Review of Systems  Constitutional: No fever/chills Eyes: No  visual changes. ENT: No sore throat. Cardiovascular: Denies chest pain. Respiratory: Denies shortness of breath. Gastrointestinal: No abdominal pain.  No nausea, no vomiting.  No diarrhea.  No constipation. Genitourinary: Negative for dysuria. Musculoskeletal: Negative for back pain. Skin: Negative for rash. Neurological: Positive for syncope.  Negative for headaches, focal weakness or numbness.   ____________________________________________   PHYSICAL EXAM:  VITAL SIGNS: ED Triage Vitals  Enc Vitals Group     BP 03/05/20 1914 96/69     Pulse Rate 03/05/20 1914 64     Resp 03/05/20 1914 16     Temp 03/05/20 1914 98.2 F (36.8 C)     Temp Source 03/05/20 2135 Oral     SpO2 03/05/20 1914 98 %     Weight 03/05/20  1914 220 lb 7.4 oz (100 kg)     Height 03/05/20 1914 5\' 11"  (1.803 m)     Head Circumference --      Peak Flow --      Pain Score 03/05/20 1914 8     Pain Loc --      Pain Edu? --      Excl. in GC? --     Constitutional: Alert and oriented. Well appearing and in no acute distress. Eyes: Conjunctivae are normal. PERRL. EOMI. Head: Atraumatic. Nose: Atraumatic. Mouth/Throat: Mucous membranes are mildly dry.  No dental malocclusion.   Neck: No stridor.  No cervical spine tenderness to palpation. Cardiovascular: Normal rate, regular rhythm. Grossly normal heart sounds.  Good peripheral circulation. Respiratory: Normal respiratory effort.  No retractions. Lungs CTAB. Gastrointestinal: Soft and nontender to light or deep palpation. No distention. No abdominal bruits. No CVA tenderness. Musculoskeletal: No lower extremity tenderness nor edema.  No joint effusions. Neurologic: Alert and oriented x3.  Normal speech and language. No gross focal neurologic deficits are appreciated.  Skin:  Skin is warm, dry and intact. No rash noted. Psychiatric: Mood and affect are flat. Speech and behavior are normal.  ____________________________________________   LABS (all labs  ordered are listed, but only abnormal results are displayed)  Labs Reviewed  BASIC METABOLIC PANEL - Abnormal; Notable for the following components:      Result Value   Glucose, Bld 100 (*)    Calcium 8.7 (*)    All other components within normal limits  CBC  VALPROIC ACID LEVEL  URINALYSIS, COMPLETE (UACMP) WITH MICROSCOPIC  URINE DRUG SCREEN, QUALITATIVE ([facility] ONLY)  TROPONIN I (HIGH SENSITIVITY)  TROPONIN I (HIGH SENSITIVITY)   ____________________________________________  EKG  ED ECG REPORT I, Jandi Swiger J, the attending physician, personally viewed and interpreted this ECG.   Date: 03/06/2020  EKG Time: 1910  Rate: 65  Rhythm: normal EKG, normal sinus rhythm  Axis: Normal  Intervals:none  ST&T Change: Nonspecific  ____________________________________________  RADIOLOGY I, Nevaya Nagele J, personally viewed and evaluated these images (plain radiographs) as part of my medical decision making, as well as reviewing the written report by the radiologist.  ED MD interpretation: No ICH; no acute cardiopulmonary process  Official radiology report(s): DG Chest 1 View  Result Date: 03/05/2020 CLINICAL DATA:  Pain status post fall EXAM: CHEST  1 VIEW COMPARISON:  01/05/2020 FINDINGS: The heart size and mediastinal contours are within normal limits. Both lungs are clear. The visualized skeletal structures are unremarkable. IMPRESSION: No active disease. Electronically Signed   By: 03/06/2020 M.D.   On: 03/05/2020 23:51   CT Head Wo Contrast  Result Date: 03/05/2020 CLINICAL DATA:  Mental status change EXAM: CT HEAD WITHOUT CONTRAST TECHNIQUE: Contiguous axial images were obtained from the base of the skull through the vertex without intravenous contrast. COMPARISON:  January 05, 2020 FINDINGS: Brain: No evidence of acute territorial infarction, hemorrhage, hydrocephalus,extra-axial collection or mass lesion/mass effect. Mild low-attenuation changes in the deep white matter  consistent with small vessel ischemia. Ventricles are normal in size and contour. Vascular: No hyperdense vessel or unexpected calcification. Skull: The skull is intact. No fracture or focal lesion identified. Sinuses/Orbits: The visualized paranasal sinuses and mastoid air cells are clear. The orbits and globes intact. Other: None IMPRESSION: No acute intracranial abnormality. Electronically Signed   By: January 07, 2020 M.D.   On: 03/05/2020 23:49    ____________________________________________   PROCEDURES  Procedure(s) performed (including Critical Care):  Procedures  ____________________________________________   INITIAL IMPRESSION / ASSESSMENT AND PLAN / ED COURSE  As part of my medical decision making, I reviewed the following data within the electronic MEDICAL RECORD NUMBER Nursing notes reviewed and incorporated, Labs reviewed, EKG interpreted, Old chart reviewed, Radiograph reviewed and Notes from prior ED visits        44 year old male presenting with syncope.  Differential diagnosis includes but is not limited to CVA, ICH, ACS, infectious, metabolic etiologies, etc.  Laboratory results unremarkable including 2 sets of negative troponins.  CT head and chest x-ray unremarkable.  Will initiate IV fluid resuscitation.  Check valproic acid level and urinalysis.  Will reassess.   Clinical Course as of Mar 07 523  Wynelle Link Mar 06, 2020  8315 Patient initially urinated in the commode without a sample being collected.  He is feeling fine, tired of waiting and eager for discharge home.  Strict return precautions given.  Patient verbalizes understanding and agrees with plan of care.   [JS]    Clinical Course User Index [JS] [name], MD     ____________________________________________   FINAL CLINICAL IMPRESSION(S) / ED DIAGNOSES  Final diagnoses:  Syncope, unspecified syncope type     ED Discharge Orders    None      *Please note:  Anvith Harnisch was evaluated in  Emergency Department on 03/06/2020 for the symptoms described in the history of present illness. He was evaluated in the context of the global COVID-19 pandemic, which necessitated consideration that the patient might be at risk for infection with the SARS-CoV-2 virus that causes COVID-19. Institutional protocols and algorithms that pertain to the evaluation of patients at risk for COVID-19 are in a state of rapid change based on information released by regulatory bodies including the CDC and federal and state organizations. These policies and algorithms were followed during the patient's care in the ED.  Some ED evaluations and interventions may be delayed as a result of limited staffing during and the pandemic.*   Note:  This document was prepared using Dragon voice recognition software and may include unintentional dictation errors.   [name], MD 03/06/20 [phone]

## 2020-03-15 ENCOUNTER — Other Ambulatory Visit: Payer: Self-pay

## 2020-03-15 ENCOUNTER — Emergency Department
Admission: EM | Admit: 2020-03-15 | Discharge: 2020-03-15 | Disposition: A | Discharge status: Home / Self Care | Payer: Medicaid Other | Attending: Emergency Medicine | Admitting: Emergency Medicine

## 2020-03-15 ENCOUNTER — Emergency Department: Payer: Medicaid Other

## 2020-03-15 DIAGNOSIS — G3184 Mild cognitive impairment, so stated: Secondary | ICD-10-CM | POA: Diagnosis not present

## 2020-03-15 DIAGNOSIS — W06XXXA Fall from bed, initial encounter: Secondary | ICD-10-CM | POA: Diagnosis not present

## 2020-03-15 DIAGNOSIS — R55 Syncope and collapse: Secondary | ICD-10-CM

## 2020-03-15 DIAGNOSIS — Y92003 Bedroom of unspecified non-institutional (private) residence as the place of occurrence of the external cause: Secondary | ICD-10-CM | POA: Insufficient documentation

## 2020-03-15 DIAGNOSIS — I10 Essential (primary) hypertension: Secondary | ICD-10-CM | POA: Insufficient documentation

## 2020-03-15 DIAGNOSIS — F17210 Nicotine dependence, cigarettes, uncomplicated: Secondary | ICD-10-CM | POA: Diagnosis not present

## 2020-03-15 DIAGNOSIS — Z79899 Other long term (current) drug therapy: Secondary | ICD-10-CM | POA: Insufficient documentation

## 2020-03-15 DIAGNOSIS — W19XXXA Unspecified fall, initial encounter: Secondary | ICD-10-CM

## 2020-03-15 LAB — BASIC METABOLIC PANEL
Anion gap: 15 (ref 5–15)
BUN: 17 mg/dL (ref 6–20)
CO2: 21 mmol/L — ABNORMAL LOW (ref 22–32)
Calcium: 9.2 mg/dL (ref 8.9–10.3)
Chloride: 104 mmol/L (ref 98–111)
Creatinine, Ser: 0.85 mg/dL (ref 0.61–1.24)
GFR, Estimated: >60 mL/min (ref >60)
Glucose, Bld: 90 mg/dL (ref 70–99)
Potassium: 4.8 mmol/L (ref 3.5–5.1)
Sodium: 140 mmol/L (ref 135–145)

## 2020-03-15 LAB — CBC WITH DIFFERENTIAL/PLATELET
Abs Immature Granulocytes: 0.02 10*3/uL (ref 0.00–0.07)
Basophils Absolute: 0 10*3/uL (ref 0.0–0.1)
Basophils Relative: 0 %
Eosinophils Absolute: 0 10*3/uL (ref 0.0–0.5)
Eosinophils Relative: 0 %
HCT: 47.1 % (ref 39.0–52.0)
Hemoglobin: 16.1 g/dL (ref 13.0–17.0)
Immature Granulocytes: 0 %
Lymphocytes Relative: 22 %
Lymphs Abs: 1.9 10*3/uL (ref 0.7–4.0)
MCH: 30.3 pg (ref 26.0–34.0)
MCHC: 34.2 g/dL (ref 30.0–36.0)
MCV: 88.7 fL (ref 80.0–100.0)
Monocytes Absolute: 0.8 10*3/uL (ref 0.1–1.0)
Monocytes Relative: 9 %
Neutro Abs: 5.9 10*3/uL (ref 1.7–7.7)
Neutrophils Relative %: 69 %
Platelets: 212 10*3/uL (ref 150–400)
RBC: 5.31 MIL/uL (ref 4.22–5.81)
RDW: 14 % (ref 11.5–15.5)
WBC: 8.7 10*3/uL (ref 4.0–10.5)
nRBC: 0.2 % (ref 0.0–0.2)

## 2020-03-15 LAB — VALPROIC ACID LEVEL: Valproic Acid Lvl: 97 ug/mL (ref 50.0–100.0)

## 2020-03-15 LAB — TROPONIN I (HIGH SENSITIVITY): Troponin I (High Sensitivity): 5 ng/L (ref <18)

## 2020-03-15 LAB — GLUCOSE, CAPILLARY: Glucose-Capillary: 91 mg/dL (ref 70–99)

## 2020-03-15 NOTE — ED Notes (Signed)
This RN called [employer] who stated unsure of time frame on transportation for pt

## 2020-03-15 NOTE — ED Notes (Signed)
Ray, second patient contact called this RN at this time and states that they are going to send someone to pick him up after they get off work at 9 pm. Rosalia Hammers to call this RN back when they are outside.

## 2020-03-15 NOTE — ED Provider Notes (Signed)
[facility] Emergency Department Provider Note   ____________________________________________   First MD Initiated Contact with Patient 03/15/20 1321     (approximate)  I have reviewed the triage vital signs and the nursing notes.   HISTORY  Chief Complaint Back Pain and Fall    HPI Russell Buchanan is a 44 y.o. male with possible history of schizophrenia, seizures, hypertension, and hyperlipidemia who presents to the ED for fall.  History is limited due to patient's baseline cognitive disability.  Per EMS, patient was noted by staff at his group home to have slid out of the bed onto the floor.  There was concern he may have passed out, but there was not any witnessed seizure activity.  Patient currently denies any complaints and is requesting to be discharged home.        Past Medical History:  Diagnosis Date  . Antisocial personality disorder (HCC)   . Bipolar disorder (HCC)   . Environmental and seasonal allergies   . GERD (gastroesophageal reflux disease)   . Headache   . Hyperlipidemia   . Infected elbow (HCC)   . Schizophrenia (HCC)   . Seizures (HCC)   . Tremors of nervous system     Patient Active Problem List   Diagnosis Date Noted  . Seizure (HCC) 01/10/2020  . AMS (altered mental status) 06/25/2019  . Bipolar disorder (HCC)   . Antisocial personality disorder (HCC)   . Acute encephalopathy 12/04/2016  . GERD (gastroesophageal reflux disease) 12/04/2016  . HLD (hyperlipidemia) 12/04/2016  . Schizophrenia (HCC) 11/15/2016  . Medication side effects 11/15/2016    Past Surgical History:  Procedure Laterality Date  . CLOSED REDUCTION WRIST FRACTURE Left   . HARDWARE REMOVAL Right 04/14/2018   Procedure: HARDWARE REMOVAL-RIGHT ELBOW;  Surgeon: Deeann Saint, MD;  Location: [facility] ORS;  Service: Orthopedics;  Laterality: Right;  . ORIF ELBOW FRACTURE Right   . SCALP LACERATION REPAIR    . WRIST RECONSTRUCTION Left     Prior to  Admission medications   Medication Sig Start Date End Date Taking? Authorizing Provider  albuterol (PROVENTIL HFA;VENTOLIN HFA) 108 (90 Base) MCG/ACT inhaler Inhale 2 puffs into the lungs every 6 (six) hours as needed for wheezing or shortness of breath. 01/30/17   Merrily Brittle, MD  benztropine (COGENTIN) 1 MG tablet Take 1 mg by mouth 3 (three) times daily.     [provider]  cetirizine (ZYRTEC) 10 MG tablet Take 10 mg by mouth daily.    [provider]  divalproex (DEPAKOTE) 500 MG DR tablet Take 1 tablet (500 mg total) by mouth every morning. 06/27/19 06/26/20  Darlin Priestly, MD  divalproex (DEPAKOTE) 500 MG DR tablet Take 2 tablets (1,000 mg total) by mouth at bedtime. 06/27/19   Darlin Priestly, MD  docusate sodium (COLACE) 100 MG capsule Take 100 mg by mouth daily.    [provider]  EPINEPHrine 0.3 mg/0.3 mL IJ SOAJ injection Inject 0.3 mg into the muscle as needed (allergic reactions).     [provider]  escitalopram (LEXAPRO) 20 MG tablet Take 20 mg by mouth daily.    [provider]  Fremanezumab-vfrm (AJOVY) 225 MG/1.5ML SOSY Inject into the skin every 28 (twenty-eight) days.    [provider]  furosemide (LASIX) 40 MG tablet Take 1 tablet (40 mg total) by mouth daily for 30 days. 06/25/18 01/10/20  Minna Antis, MD  gabapentin (NEURONTIN) 600 MG tablet Take 600 mg by mouth 3 (three) times daily.  [provider]  gemfibrozil (LOPID) 600 MG tablet Take 600 mg by mouth 2 (two) times daily before a meal.    [provider]  ketoconazole (NIZORAL) 2 % cream Apply 1 application topically 2 (two) times daily.    [provider]  Lacosamide (VIMPAT) 100 MG TABS Take 100 mg by mouth 2 (two) times daily.    [provider]  linaclotide (LINZESS) 72 MCG capsule Take 72 mcg by mouth daily.     [provider]  lipase/protease/amylase (CREON) 36000 UNITS CPEP capsule Take 36,000 Units by mouth 3  (three) times daily with meals. 2 caps withmeals and 1 with snacks    [provider]  meloxicam (MOBIC) 15 MG tablet Take 1 tablet (15 mg total) by mouth daily as needed for pain. 06/27/19   Darlin Priestly, MD  omeprazole (PRILOSEC) 40 MG capsule Take 40 mg by mouth 2 (two) times daily before a meal. 04/16/19   [provider]  ondansetron (ZOFRAN) 8 MG tablet Take 8 mg by mouth every 8 (eight) hours as needed for nausea. 06/16/19   [provider]  Paliperidone Palmitate ER (INVEGA TRINZA) 819 MG/2.625ML SUSY Inject into the muscle every 3 (three) months.    [provider]  polyethylene glycol (MIRALAX / GLYCOLAX) packet Take 17 g by mouth daily.    [provider]  potassium chloride SA (KLOR-CON) 20 MEQ tablet Take 20 mEq by mouth daily.    [provider]  pravastatin (PRAVACHOL) 40 MG tablet Take 40 mg by mouth every evening. 06/24/19   [provider]  propranolol ER (INDERAL LA) 160 MG SR capsule Take 160 mg by mouth daily.    [provider]  QUEtiapine (SEROQUEL) 100 MG tablet Take 100 mg by mouth at bedtime.  06/24/19   [provider]  simethicone (MYLICON) 80 MG chewable tablet Chew 80 mg by mouth 2 (two) times daily.    [provider]  SUMAtriptan (IMITREX) 100 MG tablet Take 100 mg by mouth every 2 (two) hours as needed for migraine. May repeat in 2 hours if headache persists or recurs.    [provider]  Tiotropium Bromide-Olodaterol (STIOLTO RESPIMAT IN) Inhale 2 puffs into the lungs daily.    [provider]    Allergies Bee venom  Family History  Family history unknown: Yes    Social History Social History   Tobacco Use  . Smoking status: Current Every Day Smoker    Packs/day: 0.50    Years: 7.00    Pack years: 3.50    Types: Cigarettes  . Smokeless tobacco: Former Neurosurgeon    Types: Chew, Snuff  Vaping Use  . Vaping Use: Never used  Substance Use Topics  . Alcohol  use: No  . Drug use: Not Currently    Review of Systems  Constitutional: No fever/chills Eyes: No visual changes. ENT: No sore throat. Cardiovascular: Denies chest pain.  Positive for syncope. Respiratory: Denies shortness of breath. Gastrointestinal: No abdominal pain.  No nausea, no vomiting.  No diarrhea.  No constipation. Genitourinary: Negative for dysuria. Musculoskeletal: Negative for back pain. Skin: Negative for rash. Neurological: Negative for headaches, focal weakness or numbness.  ____________________________________________   PHYSICAL EXAM:  VITAL SIGNS: ED Triage Vitals  Enc Vitals Group     BP 03/15/20 1207 116/89     Pulse Rate 03/15/20 1207 79     Resp 03/15/20 1207 18     Temp 03/15/20 1207 98.2 F (36.8  C)     Temp Source 03/15/20 1207 Oral     SpO2 03/15/20 1207 99 %     Weight 03/15/20 1209 220 lb 7.4 oz (100 kg)     Height 03/15/20 1209 5\' 11"  (1.803 m)     Head Circumference --      Peak Flow --      Pain Score 03/15/20 1208 5     Pain Loc --      Pain Edu? --      Excl. in GC? --     Constitutional: Alert and oriented. Eyes: Conjunctivae are normal.  Pupils equal round reactive to light bilaterally. Head: Atraumatic. Nose: No congestion/rhinnorhea. Mouth/Throat: Mucous membranes are moist. Neck: Normal ROM, no midline cervical spine tenderness. Cardiovascular: Normal rate, regular rhythm. Grossly normal heart sounds. Respiratory: Normal respiratory effort.  No retractions. Lungs CTAB. Gastrointestinal: Soft and nontender. No distention. Genitourinary: deferred Musculoskeletal: No lower extremity tenderness nor edema. Neurologic:  Normal speech and language. No gross focal neurologic deficits are appreciated. Skin:  Skin is warm, dry and intact. No rash noted. Psychiatric: Mood and affect are normal. Speech and behavior are normal.  ____________________________________________   LABS (all labs ordered are listed, but only abnormal  results are displayed)  Labs Reviewed  BASIC METABOLIC PANEL - Abnormal; Notable for the following components:      Result Value   CO2 21 (*)    All other components within normal limits  CBC WITH DIFFERENTIAL/PLATELET  VALPROIC ACID LEVEL  GLUCOSE, CAPILLARY  TROPONIN I (HIGH SENSITIVITY)   ____________________________________________  EKG  ED ECG REPORT I, 05/15/20, the attending physician, personally viewed and interpreted this ECG.   Date: 03/15/2020  EKG Time: 13:54  Rate: 60  Rhythm: normal sinus rhythm  Axis: Normal  Intervals:none  ST&T Change: None   PROCEDURES  Procedure(s) performed (including Critical Care):  Procedures   ____________________________________________   INITIAL IMPRESSION / ASSESSMENT AND PLAN / ED COURSE       44 year old male with past medical history of schizophrenia, seizures, hypertension, and hyperlipidemia who presents to the ED following fall at his group home where there was questionable syncope but no witnessed seizure activity.  He is awake and alert upon arrival to the ED, appears to have returned to his baseline mental status.  He has had similar presentations to the ED and is well-known to staff, is currently acting appropriately per his baseline.  No focal neurologic deficits noted on exam, CT head and C-spine are negative for acute process.  Lab work is reassuring and his Depakote level is within normal limits.  No evidence of arrhythmia or ischemia noted on EKG.  No evidence of any other potential traumatic injury.  He is appropriate for discharge back to group home.      ____________________________________________   FINAL CLINICAL IMPRESSION(S) / ED DIAGNOSES  Final diagnoses:  Fall, initial encounter  Syncope, unspecified syncope type     ED Discharge Orders    None       Note:  This document was prepared using Dragon voice recognition software and may include unintentional dictation errors.     06-30-1970, MD 03/15/20 1459

## 2020-03-15 NOTE — ED Notes (Signed)
Pt unable to sign name for dc but gave verbal consent.

## 2020-03-15 NOTE — ED Triage Notes (Signed)
First nurse note: Patient arrives from group home on [facility]. Via [facility] for c/o "fall" per staff. Patient slid downs ide of bed into floor, now c/o lower back pain. Patient has h/o schizophrenia, GERd, hyperlipidemia. Patient's vitals per EMS: 120/52, 98%, 67 HR. Patient was ambulatory on scene for EMS. Reports he has not taken any medications or had breakfast today. When EMS asked staff if patient's mental status was at baseline, staff reported they had only been employed there for three days and did not know.

## 2020-03-15 NOTE — ED Notes (Signed)
Russell Buchanan contacted at this time, per company, cab will be 30-45 minutes.

## 2020-03-15 NOTE — ED Notes (Signed)
Attempt made to contact Russell Buchanan at this time. No answer on either number.

## 2020-03-15 NOTE — ED Notes (Signed)
This RN contacted Rowan Blase, owner of group home that pt resides at. Per Rowan Blase, pt is to be discharged and returned home via [address] (as usual). Rowan Blase will pay for cab and be there upon patient arrival

## 2020-03-15 NOTE — ED Triage Notes (Signed)
Please see first nurse note. 

## 2020-03-15 NOTE — ED Notes (Signed)
Caregiver, Rowan Blase contacted at this time and notified of taxi company not coming at this time.

## 2020-03-27 ENCOUNTER — Emergency Department: Payer: Medicaid Other

## 2020-03-27 ENCOUNTER — Other Ambulatory Visit: Payer: Self-pay

## 2020-03-27 ENCOUNTER — Emergency Department
Admission: EM | Admit: 2020-03-27 | Discharge: 2020-03-27 | Disposition: A | Discharge status: Home / Self Care | Payer: Medicaid Other | Attending: Emergency Medicine | Admitting: Emergency Medicine

## 2020-03-27 DIAGNOSIS — F17210 Nicotine dependence, cigarettes, uncomplicated: Secondary | ICD-10-CM | POA: Insufficient documentation

## 2020-03-27 DIAGNOSIS — M6281 Muscle weakness (generalized): Secondary | ICD-10-CM | POA: Insufficient documentation

## 2020-03-27 DIAGNOSIS — R4182 Altered mental status, unspecified: Secondary | ICD-10-CM | POA: Diagnosis not present

## 2020-03-27 DIAGNOSIS — R531 Weakness: Secondary | ICD-10-CM

## 2020-03-27 LAB — COMPREHENSIVE METABOLIC PANEL
ALT: 11 U/L (ref 0–44)
AST: 20 U/L (ref 15–41)
Albumin: 3.6 g/dL (ref 3.5–5.0)
Alkaline Phosphatase: 123 U/L (ref 38–126)
Anion gap: 12 (ref 5–15)
BUN: 11 mg/dL (ref 6–20)
CO2: 26 mmol/L (ref 22–32)
Calcium: 9.7 mg/dL (ref 8.9–10.3)
Chloride: 103 mmol/L (ref 98–111)
Creatinine, Ser: 0.74 mg/dL (ref 0.61–1.24)
GFR, Estimated: >60 mL/min (ref >60)
Glucose, Bld: 93 mg/dL (ref 70–99)
Potassium: 3.8 mmol/L (ref 3.5–5.1)
Sodium: 141 mmol/L (ref 135–145)
Total Bilirubin: 0.9 mg/dL (ref 0.3–1.2)
Total Protein: 7.2 g/dL (ref 6.5–8.1)

## 2020-03-27 LAB — CBC
HCT: 42.3 % (ref 39.0–52.0)
Hemoglobin: 14.9 g/dL (ref 13.0–17.0)
MCH: 30.9 pg (ref 26.0–34.0)
MCHC: 35.2 g/dL (ref 30.0–36.0)
MCV: 87.8 fL (ref 80.0–100.0)
Platelets: 223 10*3/uL (ref 150–400)
RBC: 4.82 MIL/uL (ref 4.22–5.81)
RDW: 14.1 % (ref 11.5–15.5)
WBC: 6.4 10*3/uL (ref 4.0–10.5)
nRBC: 0 % (ref 0.0–0.2)

## 2020-03-27 LAB — TROPONIN I (HIGH SENSITIVITY): Troponin I (High Sensitivity): 4 ng/L (ref <18)

## 2020-03-27 MED ORDER — SODIUM CHLORIDE 0.9 % IV BOLUS
1000.0000 mL | Freq: Once | INTRAVENOUS | Status: AC
  Administered 2020-03-27: 1000 mL via INTRAVENOUS

## 2020-03-27 NOTE — ED Notes (Signed)
Tried to call all numbers in chart. No answer and one of them is different name than in chart. Pt sitting on bed. Asked pt to wait before trying to walk out. All clothes that pt came in with are soaked in urine.

## 2020-03-27 NOTE — ED Notes (Signed)
Pt ripped out IV and pulled off cardiac leads. Is saying he wants to go home. States that ED provider told him he can discharge.

## 2020-03-27 NOTE — ED Triage Notes (Addendum)
Pt arrives via EMS from his group home after they could not get him changed because he was dead weight- pt drenched in urine with foul smell- per EMS pt is A&O x2- pt normally not bed bound- EMS was called out 4 days ago but pt refused to go

## 2020-03-27 NOTE — ED Notes (Signed)
Pt to CT

## 2020-03-27 NOTE — ED Notes (Signed)
Pt pulled clean clothes out of separate bag from soiled clothes and dressed self. Walking around room. Informed pt that staff are trying to get in touch with someone to pick pt up.

## 2020-03-27 NOTE — ED Provider Notes (Signed)
[facility] Emergency Department Provider Note  Time seen: 11:08 AM  I have reviewed the triage vital signs and the nursing notes.   HISTORY  Chief Complaint Altered Mental Status   HPI Russell Buchanan is a 44 y.o. male with a past medical history of bipolar, gastric reflux, schizophrenia, hyperlipidemia, presents to the emergency department for generalized weakness.  According to report patient has been feeling very weak at his nursing facility and they were unable to get the patient dressed this morning because he was not helping.  Here the patient states he has been feeling weak but he has been able to get up and ambulate on his own including this morning per patient.  Patient denies any recent fevers or cough denies chest pain abdominal pain, does state nausea but denies vomiting or diarrhea.  Patient is hungry and is asking for something to eat.   Past Medical History:  Diagnosis Date  . Antisocial personality disorder (HCC)   . Bipolar disorder (HCC)   . Environmental and seasonal allergies   . GERD (gastroesophageal reflux disease)   . Headache   . Hyperlipidemia   . Infected elbow (HCC)   . Schizophrenia (HCC)   . Seizures (HCC)   . Tremors of nervous system     Patient Active Problem List   Diagnosis Date Noted  . Seizure (HCC) 01/10/2020  . AMS (altered mental status) 06/25/2019  . Bipolar disorder (HCC)   . Antisocial personality disorder (HCC)   . Acute encephalopathy 12/04/2016  . GERD (gastroesophageal reflux disease) 12/04/2016  . HLD (hyperlipidemia) 12/04/2016  . Schizophrenia (HCC) 11/15/2016  . Medication side effects 11/15/2016    Past Surgical History:  Procedure Laterality Date  . CLOSED REDUCTION WRIST FRACTURE Left   . HARDWARE REMOVAL Right 04/14/2018   Procedure: HARDWARE REMOVAL-RIGHT ELBOW;  Surgeon: Deeann Saint, MD;  Location: [facility] ORS;  Service: Orthopedics;  Laterality: Right;  . ORIF ELBOW FRACTURE Right   .  SCALP LACERATION REPAIR    . WRIST RECONSTRUCTION Left     Prior to Admission medications   Medication Sig Start Date End Date Taking? Authorizing Provider  albuterol (PROVENTIL HFA;VENTOLIN HFA) 108 (90 Base) MCG/ACT inhaler Inhale 2 puffs into the lungs every 6 (six) hours as needed for wheezing or shortness of breath. 01/30/17   Merrily Brittle, MD  benztropine (COGENTIN) 1 MG tablet Take 1 mg by mouth 3 (three) times daily.     [provider]  cetirizine (ZYRTEC) 10 MG tablet Take 10 mg by mouth daily.    [provider]  divalproex (DEPAKOTE) 500 MG DR tablet Take 1 tablet (500 mg total) by mouth every morning. 06/27/19 06/26/20  Darlin Priestly, MD  divalproex (DEPAKOTE) 500 MG DR tablet Take 2 tablets (1,000 mg total) by mouth at bedtime. 06/27/19   Darlin Priestly, MD  docusate sodium (COLACE) 100 MG capsule Take 100 mg by mouth daily.    [provider]  EPINEPHrine 0.3 mg/0.3 mL IJ SOAJ injection Inject 0.3 mg into the muscle as needed (allergic reactions).     [provider]  escitalopram (LEXAPRO) 20 MG tablet Take 20 mg by mouth daily.    [provider]  Fremanezumab-vfrm (AJOVY) 225 MG/1.5ML SOSY Inject into the skin every 28 (twenty-eight) days.    [provider]  furosemide (LASIX) 40 MG tablet Take 1 tablet (40 mg total) by mouth daily for 30 days. 06/25/18 01/10/20  Minna Antis, MD  gabapentin (NEURONTIN) 600 MG tablet  Take 600 mg by mouth 3 (three) times daily.    [provider]  gemfibrozil (LOPID) 600 MG tablet Take 600 mg by mouth 2 (two) times daily before a meal.    [provider]  ketoconazole (NIZORAL) 2 % cream Apply 1 application topically 2 (two) times daily.    [provider]  Lacosamide (VIMPAT) 100 MG TABS Take 100 mg by mouth 2 (two) times daily.    [provider]  linaclotide (LINZESS) 72 MCG capsule Take 72 mcg by mouth daily.     [provider]   lipase/protease/amylase (CREON) 36000 UNITS CPEP capsule Take 36,000 Units by mouth 3 (three) times daily with meals. 2 caps withmeals and 1 with snacks    [provider]  meloxicam (MOBIC) 15 MG tablet Take 1 tablet (15 mg total) by mouth daily as needed for pain. 06/27/19   Darlin Priestly, MD  omeprazole (PRILOSEC) 40 MG capsule Take 40 mg by mouth 2 (two) times daily before a meal. 04/16/19   [provider]  ondansetron (ZOFRAN) 8 MG tablet Take 8 mg by mouth every 8 (eight) hours as needed for nausea. 06/16/19   [provider]  Paliperidone Palmitate ER (INVEGA TRINZA) 819 MG/2.625ML SUSY Inject into the muscle every 3 (three) months.    [provider]  polyethylene glycol (MIRALAX / GLYCOLAX) packet Take 17 g by mouth daily.    [provider]  potassium chloride SA (KLOR-CON) 20 MEQ tablet Take 20 mEq by mouth daily.    [provider]  pravastatin (PRAVACHOL) 40 MG tablet Take 40 mg by mouth every evening. 06/24/19   [provider]  propranolol ER (INDERAL LA) 160 MG SR capsule Take 160 mg by mouth daily.    [provider]  QUEtiapine (SEROQUEL) 100 MG tablet Take 100 mg by mouth at bedtime.  06/24/19   [provider]  simethicone (MYLICON) 80 MG chewable tablet Chew 80 mg by mouth 2 (two) times daily.    [provider]  SUMAtriptan (IMITREX) 100 MG tablet Take 100 mg by mouth every 2 (two) hours as needed for migraine. May repeat in 2 hours if headache persists or recurs.    [provider]  Tiotropium Bromide-Olodaterol (STIOLTO RESPIMAT IN) Inhale 2 puffs into the lungs daily.    [provider]    Allergies  Allergen Reactions  . Bee Venom Hives    Family History  Family history unknown: Yes    Social History Social History   Tobacco Use  . Smoking status: Current Every Day Smoker    Packs/day: 0.50    Years: 7.00    Pack years: 3.50    Types: Cigarettes  .  Smokeless tobacco: Former Neurosurgeon    Types: Chew, Snuff  Vaping Use  . Vaping Use: Never used  Substance Use Topics  . Alcohol use: No  . Drug use: Not Currently    Review of Systems Constitutional: Negative for fever. Cardiovascular: Negative for chest pain. Respiratory: Negative for shortness of breath. Gastrointestinal: Negative for abdominal pain, vomiting and diarrhea.  Positive for nausea. Genitourinary: Negative for urinary compaints Musculoskeletal: Negative for musculoskeletal complaints Neurological: Negative for headache All other ROS negative  ____________________________________________   PHYSICAL EXAM:  VITAL SIGNS: ED Triage Vitals  Enc Vitals Group     BP 03/27/20 1001 110/66     Pulse Rate 03/27/20 1001 60     Resp 03/27/20 1001 20     Temp 03/27/20  1006 98.2 F (36.8 C)     Temp Source 03/27/20 1006 Oral     SpO2 03/27/20 1001 98 %     Weight 03/27/20 1002 220 lb 7.4 oz (100 kg)     Height 03/27/20 1002 5\' 11"  (1.803 m)     Head Circumference --      Peak Flow --      Pain Score 03/27/20 1002 0     Pain Loc --      Pain Edu? --      Excl. in GC? --    Constitutional: Alert and oriented. Well appearing and in no distress. Eyes: Normal exam ENT      Head: Normocephalic and atraumatic.      Mouth/Throat: Dry lips/mucous membranes. Cardiovascular: Normal rate, regular rhythm. No murmur Respiratory: Normal respiratory effort without tachypnea nor retractions. Breath sounds are clear Gastrointestinal: Soft and nontender. No distention.   Musculoskeletal: Nontender with normal range of motion in all extremities. Neurologic:  Normal speech and language. No gross focal neurologic deficits Skin:  Skin is warm, dry and intact.  Psychiatric: Mood and affect are normal.  ____________________________________________    RADIOLOGY  Did not tolerate CT scan.  Patient refusing further attempt.  ____________________________________________   INITIAL  IMPRESSION / ASSESSMENT AND PLAN / ED COURSE  Pertinent labs & imaging results that were available during my care of the patient were reviewed by me and considered in my medical decision making (see chart for details).   Patient presents to the emergency department for generalized weakness.  Patient has no complaints personally and states he feels fine was able to walk this morning per patient.  Denies any falls.  Largely negative review of systems.  However given the complaint of generalized weakness we will check labs, urinalysis and a CT scan of the head as a precaution.  Patient agreeable to plan of care.  Patient states he does not want to wait for urine sample.  He is up walking around the room.  Was able to dress himself.  He is asking to be discharged home.  Russell Buchanan was evaluated in Emergency Department on 03/27/2020 for the symptoms described in the history of present illness. He was evaluated in the context of the global COVID-19 pandemic, which necessitated consideration that the patient might be at risk for infection with the SARS-CoV-2 virus that causes COVID-19. Institutional protocols and algorithms that pertain to the evaluation of patients at risk for COVID-19 are in a state of rapid change based on information released by regulatory bodies including the CDC and federal and state organizations. These policies and algorithms were followed during the patient's care in the ED.  ____________________________________________   FINAL CLINICAL IMPRESSION(S) / ED DIAGNOSES  Weakness   03/29/2020, MD 03/27/20 1258

## 2020-03-27 NOTE — ED Notes (Signed)
Pt answers questions appropriately but is slow to respond.

## 2020-03-27 NOTE — ED Notes (Signed)
1cm by 2cm abrasion upper shaft of penis. Appears to be moisture-associated damage.

## 2020-03-27 NOTE — ED Notes (Signed)
This RN called [name] who stated that pt can go back by cab. Called [place] who stated they would pick pt up in an hour. Pt kept walking towards the lobby, would not stay in room to wait for cab. Heather RN walked pt out to lobby to wait for cab. Pt took belongings with him but did not sign for DC d/t not wanting to walk back to room to sign.

## 2020-03-27 NOTE — ED Notes (Signed)
Pt pulled off several cardiac leads and stated he needed to go "catch a bus". Instructed pt to leave cardiac leads on and reoriented to place and situation.

## 2020-03-27 NOTE — ED Notes (Signed)
Provided [place] sandwich tray to pt with water and applesauce.

## 2020-03-27 NOTE — ED Notes (Signed)
Pt was calling out for doctor. ED provider at bedside.

## 2020-03-27 NOTE — ED Notes (Addendum)
Unable to assess all components of NIH stroke scale. Pt follows commands but did not respond to request to hold arms out. Pt is slow to respond but answers orientation questions correctly and does not show dysarthria. Face symmetrical, bilateral sensory awareness on face, arms, legs. Pt states has fallen multiple times in last few months and fell 4d ago, does not know if fell on head or if LOC. Abrasion with scab noted R knee. Pt has essential tremor in both hands that has been there "for years".

## 2020-03-27 NOTE — ED Notes (Signed)
EDP at bedside assessing pt.  

## 2020-04-05 ENCOUNTER — Emergency Department: Payer: Medicaid Other

## 2020-04-05 ENCOUNTER — Other Ambulatory Visit: Payer: Self-pay

## 2020-04-05 ENCOUNTER — Emergency Department
Admission: EM | Admit: 2020-04-05 | Discharge: 2020-04-05 | Disposition: A | Discharge status: Home / Self Care | Payer: Medicaid Other | Attending: Emergency Medicine | Admitting: Emergency Medicine

## 2020-04-05 DIAGNOSIS — R42 Dizziness and giddiness: Secondary | ICD-10-CM

## 2020-04-05 DIAGNOSIS — Z79899 Other long term (current) drug therapy: Secondary | ICD-10-CM | POA: Insufficient documentation

## 2020-04-05 DIAGNOSIS — R531 Weakness: Secondary | ICD-10-CM | POA: Diagnosis not present

## 2020-04-05 DIAGNOSIS — F17210 Nicotine dependence, cigarettes, uncomplicated: Secondary | ICD-10-CM | POA: Insufficient documentation

## 2020-04-05 LAB — URINALYSIS, COMPLETE (UACMP) WITH MICROSCOPIC
Bacteria, UA: NONE SEEN
Bilirubin Urine: NEGATIVE
Glucose, UA: NEGATIVE mg/dL
Hgb urine dipstick: NEGATIVE
Ketones, ur: 5 mg/dL — AB
Leukocytes,Ua: NEGATIVE
Nitrite: NEGATIVE
Protein, ur: NEGATIVE mg/dL
Specific Gravity, Urine: 1.014 (ref 1.005–1.030)
Squamous Epithelial / HPF: NONE SEEN (ref 0–5)
pH: 7 (ref 5.0–8.0)

## 2020-04-05 LAB — CBC
HCT: 45.1 % (ref 39.0–52.0)
Hemoglobin: 15.2 g/dL (ref 13.0–17.0)
MCH: 30.3 pg (ref 26.0–34.0)
MCHC: 33.7 g/dL (ref 30.0–36.0)
MCV: 89.8 fL (ref 80.0–100.0)
Platelets: 244 10*3/uL (ref 150–400)
RBC: 5.02 MIL/uL (ref 4.22–5.81)
RDW: 14.6 % (ref 11.5–15.5)
WBC: 7.6 10*3/uL (ref 4.0–10.5)
nRBC: 0 % (ref 0.0–0.2)

## 2020-04-05 LAB — BASIC METABOLIC PANEL
Anion gap: 11 (ref 5–15)
BUN: 6 mg/dL (ref 6–20)
CO2: 26 mmol/L (ref 22–32)
Calcium: 9.4 mg/dL (ref 8.9–10.3)
Chloride: 104 mmol/L (ref 98–111)
Creatinine, Ser: 0.84 mg/dL (ref 0.61–1.24)
GFR, Estimated: >60 mL/min (ref >60)
Glucose, Bld: 92 mg/dL (ref 70–99)
Potassium: 4 mmol/L (ref 3.5–5.1)
Sodium: 141 mmol/L (ref 135–145)

## 2020-04-05 LAB — CBG MONITORING, ED: Glucose-Capillary: 87 mg/dL (ref 70–99)

## 2020-04-05 MED ORDER — MECLIZINE HCL 25 MG PO TABS: 25.0000 mg | ORAL_TABLET | Freq: Three times a day (TID) | ORAL | 1 refills | Status: DC | PRN

## 2020-04-05 MED ORDER — MECLIZINE HCL 25 MG PO TABS
25.0000 mg | ORAL_TABLET | Freq: Once | ORAL | Status: AC
  Administered 2020-04-05: 25 mg via ORAL
  Filled 2020-04-05: qty 1

## 2020-04-05 NOTE — ED Triage Notes (Signed)
Pt comes into the ED via EMS from [facility], ems reports they were called out for a seizure, states no seizure just to weak to stand, shaky from weakness. Pt is a/ox on arrival , CBG 104. VSS

## 2020-04-05 NOTE — ED Notes (Signed)
Cab called for transport back to group home.  Pt aware, sitting in lobby until ride shows.

## 2020-04-05 NOTE — ED Provider Notes (Signed)
[facility] Emergency Department Provider Note ____________________________________________   First MD Initiated Contact with Patient 04/05/20 1022     (approximate)  I have reviewed the triage vital signs and the nursing notes.  HISTORY  Chief Complaint Weakness   HPI Russell Buchanan is a 44 y.o. malewho presents to the ED for evaluation of generalized weakness.   Chart review indicates bipolar and antisocial personality disorder, schizophrenia, GERD, HLD.  Patient seen in the ED 1 week ago for similar generalized weakness.  Patient refused to give urine sample and refused CT scan of his head.  His work-up was benign and he was able to dress himself and was discharged back to his facility.  Patient resides at [facility].  Patient presents to the ED with multiple years of generalized weakness and dizziness.  He reports that he always feels dizzy and always feels weak.  Patient reports that he has worsened dizzy symptoms when he gets up and changes position, and then it improves after a matter of seconds-minutes.  He takes no medications for this.  Patient reports getting up this morning at his group home and noting similar vertiginous dizziness that lasted a matter of seconds, causing him to stumble.  Patient denies any falls, syncope, head trauma and he indicates that the symptoms have improved.  Patient denies recent fevers, illnesses, shortness of breath, chest pain, cough, syncope, abdominal pain, emesis or diarrhea.  Denies trauma or headache.  Past Medical History:  Diagnosis Date  . Antisocial personality disorder (HCC)   . Bipolar disorder (HCC)   . Environmental and seasonal allergies   . GERD (gastroesophageal reflux disease)   . Headache   . Hyperlipidemia   . Infected elbow (HCC)   . Schizophrenia (HCC)   . Seizures (HCC)   . Tremors of nervous system     Patient Active Problem List   Diagnosis Date Noted  . Seizure  (HCC) 01/10/2020  . AMS (altered mental status) 06/25/2019  . Bipolar disorder (HCC)   . Antisocial personality disorder (HCC)   . Acute encephalopathy 12/04/2016  . GERD (gastroesophageal reflux disease) 12/04/2016  . HLD (hyperlipidemia) 12/04/2016  . Schizophrenia (HCC) 11/15/2016  . Medication side effects 11/15/2016    Past Surgical History:  Procedure Laterality Date  . CLOSED REDUCTION WRIST FRACTURE Left   . HARDWARE REMOVAL Right 04/14/2018   Procedure: HARDWARE REMOVAL-RIGHT ELBOW;  Surgeon: Deeann Saint, MD;  Location: [facility] ORS;  Service: Orthopedics;  Laterality: Right;  . ORIF ELBOW FRACTURE Right   . SCALP LACERATION REPAIR    . WRIST RECONSTRUCTION Left     Prior to Admission medications   Medication Sig Start Date End Date Taking? Authorizing Provider  albuterol (PROVENTIL HFA;VENTOLIN HFA) 108 (90 Base) MCG/ACT inhaler Inhale 2 puffs into the lungs every 6 (six) hours as needed for wheezing or shortness of breath. 01/30/17   Merrily Brittle, MD  benztropine (COGENTIN) 1 MG tablet Take 1 mg by mouth 3 (three) times daily.     [provider]  cetirizine (ZYRTEC) 10 MG tablet Take 10 mg by mouth daily.    [provider]  divalproex (DEPAKOTE) 500 MG DR tablet Take 1 tablet (500 mg total) by mouth every morning. 06/27/19 06/26/20  Darlin Priestly, MD  divalproex (DEPAKOTE) 500 MG DR tablet Take 2 tablets (1,000 mg total) by mouth at bedtime. 06/27/19   Darlin Priestly, MD  docusate sodium (COLACE) 100 MG capsule Take 100 mg by mouth daily.  [provider]  EPINEPHrine 0.3 mg/0.3 mL IJ SOAJ injection Inject 0.3 mg into the muscle as needed (allergic reactions).     [provider]  escitalopram (LEXAPRO) 20 MG tablet Take 20 mg by mouth daily.    [provider]  Fremanezumab-vfrm (AJOVY) 225 MG/1.5ML SOSY Inject into the skin every 28 (twenty-eight) days.    [provider]  furosemide (LASIX) 40 MG tablet Take 1 tablet (40 mg  total) by mouth daily for 30 days. 06/25/18 01/10/20  Minna Antis, MD  gabapentin (NEURONTIN) 600 MG tablet Take 600 mg by mouth 3 (three) times daily.    [provider]  gemfibrozil (LOPID) 600 MG tablet Take 600 mg by mouth 2 (two) times daily before a meal.    [provider]  ketoconazole (NIZORAL) 2 % cream Apply 1 application topically 2 (two) times daily.    [provider]  Lacosamide (VIMPAT) 100 MG TABS Take 100 mg by mouth 2 (two) times daily.    [provider]  linaclotide (LINZESS) 72 MCG capsule Take 72 mcg by mouth daily.     [provider]  lipase/protease/amylase (CREON) 36000 UNITS CPEP capsule Take 36,000 Units by mouth 3 (three) times daily with meals. 2 caps withmeals and 1 with snacks    [provider]  meclizine (ANTIVERT) 25 MG tablet Take 1 tablet (25 mg total) by mouth 3 (three) times daily as needed for dizziness. 04/05/20   Delton Prairie, MD  meloxicam (MOBIC) 15 MG tablet Take 1 tablet (15 mg total) by mouth daily as needed for pain. 06/27/19   Darlin Priestly, MD  omeprazole (PRILOSEC) 40 MG capsule Take 40 mg by mouth 2 (two) times daily before a meal. 04/16/19   [provider]  ondansetron (ZOFRAN) 8 MG tablet Take 8 mg by mouth every 8 (eight) hours as needed for nausea. 06/16/19   [provider]  Paliperidone Palmitate ER (INVEGA TRINZA) 819 MG/2.625ML SUSY Inject into the muscle every 3 (three) months.    [provider]  polyethylene glycol (MIRALAX / GLYCOLAX) packet Take 17 g by mouth daily.    [provider]  potassium chloride SA (KLOR-CON) 20 MEQ tablet Take 20 mEq by mouth daily.    [provider]  pravastatin (PRAVACHOL) 40 MG tablet Take 40 mg by mouth every evening. 06/24/19   [provider]  propranolol ER (INDERAL LA) 160 MG SR capsule Take 160 mg by mouth daily.    [provider]  QUEtiapine (SEROQUEL) 100 MG tablet Take 100 mg by  mouth at bedtime.  06/24/19   [provider]  simethicone (MYLICON) 80 MG chewable tablet Chew 80 mg by mouth 2 (two) times daily.    [provider]  SUMAtriptan (IMITREX) 100 MG tablet Take 100 mg by mouth every 2 (two) hours as needed for migraine. May repeat in 2 hours if headache persists or recurs.    [provider]  Tiotropium Bromide-Olodaterol (STIOLTO RESPIMAT IN) Inhale 2 puffs into the lungs daily.    [provider]    Allergies Bee venom  Family History  Family history unknown: Yes    Social History Social History   Tobacco Use  . Smoking status: Current Every Day Smoker    Packs/day: 0.50    Years: 7.00    Pack years: 3.50    Types: Cigarettes  . Smokeless tobacco: Former Neurosurgeon    Types: Chew, Snuff  Vaping Use  . Vaping  Use: Never used  Substance Use Topics  . Alcohol use: No  . Drug use: Not Currently    Review of Systems  Constitutional: No fever/chills Eyes: No visual changes. ENT: No sore throat. Cardiovascular: Denies chest pain. Respiratory: Denies shortness of breath. Gastrointestinal: No abdominal pain.  No nausea, no vomiting.  No diarrhea.  No constipation. Genitourinary: Negative for dysuria. Musculoskeletal: Negative for back pain. Skin: Negative for rash. Neurological: Negative for headaches, focal weakness or numbness.  Positive for dizziness ____________________________________________   PHYSICAL EXAM:  VITAL SIGNS: Vitals:   04/05/20 0905 04/05/20 1130  BP: 101/70 109/68  Pulse: (!) 59 (!) 53  Resp: 16 16  Temp: 99.4 F (37.4 C)   SpO2: 97% 100%     Constitutional: Alert and oriented. Well appearing and in no acute distress. Eyes: Conjunctivae are normal. PERRL. EOMI. Head: Atraumatic. Nose: No congestion/rhinnorhea. Mouth/Throat: Mucous membranes are moist.  Oropharynx non-erythematous. Neck: No stridor. No cervical spine tenderness to palpation. Cardiovascular: Normal rate, regular  rhythm. Grossly normal heart sounds.  Good peripheral circulation. Respiratory: Normal respiratory effort.  No retractions. Lungs CTAB. Gastrointestinal: Soft , nondistended, nontender to palpation. No abdominal bruits. No CVA tenderness. Musculoskeletal: No lower extremity tenderness nor edema.  No joint effusions. No signs of acute trauma. Neurologic: . No gross focal neurologic deficits are appreciated. No gait instability noted.  Cranial nerves II through XII intact 5/5 strength and sensation in all 4 extremities Skin:  Skin is warm, dry and intact. No rash noted. Psychiatric: Mood and affect are normal. Speech and behavior are normal.  ____________________________________________   LABS (all labs ordered are listed, but only abnormal results are displayed)  Labs Reviewed  URINALYSIS, COMPLETE (UACMP) WITH MICROSCOPIC - Abnormal; Notable for the following components:      Result Value   Color, Urine YELLOW (*)    APPearance CLEAR (*)    Ketones, ur 5 (*)    All other components within normal limits  BASIC METABOLIC PANEL  CBC  CBG MONITORING, ED   ____________________________________________  12 Lead EKG  Sinus rhythm, rate of 53 bpm.  Normal axis.  Normal intervals.  No evidence of acute ischemia.  Sinus bradycardia. ____________________________________________  RADIOLOGY  ED MD interpretation: CT head reviewed by me without evidence of acute intracranial pathology.  Official radiology report(s): CT Head Wo Contrast  Result Date: 04/05/2020 CLINICAL DATA:  Dizziness.  Fall. EXAM: CT HEAD WITHOUT CONTRAST TECHNIQUE: Contiguous axial images were obtained from the base of the skull through the vertex without intravenous contrast. COMPARISON:  03/15/2020. FINDINGS: Brain: No evidence of acute infarction, hemorrhage, hydrocephalus, extra-axial collection or mass lesion/mass effect. Vascular: No hyperdense vessel or unexpected calcification. Skull: No acute fracture.  Sinuses/Orbits: Visualized sinuses are clear. Visualized orbits are unremarkable. Other: No mastoid effusions. IMPRESSION: No evidence of acute intracranial abnormality. Electronically Signed   By: Feliberto Harts MD   On: 04/05/2020 11:30    ____________________________________________   PROCEDURES and INTERVENTIONS  Procedure(s) performed (including Critical Care):  .1-3 Lead EKG Interpretation Performed by: Delton Prairie, MD Authorized by: Delton Prairie, MD     Interpretation: normal     ECG rate:  55   ECG rate assessment: bradycardic     Rhythm: sinus bradycardia     Ectopy: none     Conduction: normal      Medications  meclizine (ANTIVERT) tablet 25 mg (25 mg Oral Given 04/05/20 1145)    ____________________________________________   MDM / ED COURSE  44 year old male presents  to the ED from a group home with chronic generalized weakness and dizziness, improving after meclizine, and without evidence of acute pathology, amenable to return to facility.  Normal vital signs on room air.  He is noted to have sinus bradycardia, and review of records indicates he frequently has ulcers in the 50s and 60s.  Patient expresses no complaints while in the ED and indicates that he always feels this way.  His work-up is benign without evidence of acute pathology.  CT head without evidence of ICH or other traumatic pathology.  No mass.  Patient reports improving symptoms after meclizine administration.  I see no indication for further work-up in the ED. I educated patient on return precautions for the ED and provided him with a prescription for meclizine to use as an outpatient.  Medically stable for discharge home.  Clinical Course as of Apr 05 1234  Tue Apr 05, 2020  1222 Reassessed.  Patient reports improving symptoms.  Educated patient on reassuring work-up and plans to discharge with prescription for meclizine.  He expresses understanding and agreement.  We discussed return precautions  for the ED.   [DS]    Clinical Course User Index [DS] Delton Prairie, MD     ____________________________________________   FINAL CLINICAL IMPRESSION(S) / ED DIAGNOSES  Final diagnoses:  Generalized weakness  Dizziness     ED Discharge Orders         Ordered    meclizine (ANTIVERT) 25 MG tablet  3 times daily PRN        04/05/20 1223           Rachelle Edwards   Note:  This document was prepared using Dragon voice recognition software and may include unintentional dictation errors.   Delton Prairie, MD 04/05/20 [phone]

## 2020-04-05 NOTE — ED Notes (Signed)
Pt brief saturated with urine upon arrival to room. Removed soiled brief and placed new brief on pt. Pants placed in belongings bag.

## 2020-04-05 NOTE — Discharge Instructions (Signed)
You were seen in the ED because of your weakness and dizziness.  We did blood work, CT scans of your head that did not show any signs of acute problems.  He felt better after meclizine medication.  So you are being discharged a prescription for meclizine to use as needed for any dizziness that you may feel.  Use up to 3 times a day as needed for dizziness.  Return to the ED with any worsening symptoms.

## 2020-04-12 ENCOUNTER — Emergency Department
Admission: EM | Admit: 2020-04-12 | Discharge: 2020-04-12 | Disposition: A | Discharge status: Home / Self Care | Payer: Medicaid Other | Attending: Emergency Medicine | Admitting: Emergency Medicine

## 2020-04-12 ENCOUNTER — Other Ambulatory Visit: Payer: Self-pay

## 2020-04-12 DIAGNOSIS — R5383 Other fatigue: Secondary | ICD-10-CM

## 2020-04-12 DIAGNOSIS — R531 Weakness: Secondary | ICD-10-CM | POA: Diagnosis present

## 2020-04-12 DIAGNOSIS — F17210 Nicotine dependence, cigarettes, uncomplicated: Secondary | ICD-10-CM | POA: Diagnosis not present

## 2020-04-12 LAB — CBC
HCT: 45.8 % (ref 39.0–52.0)
Hemoglobin: 15.4 g/dL (ref 13.0–17.0)
MCH: 30.1 pg (ref 26.0–34.0)
MCHC: 33.6 g/dL (ref 30.0–36.0)
MCV: 89.6 fL (ref 80.0–100.0)
Platelets: 226 10*3/uL (ref 150–400)
RBC: 5.11 MIL/uL (ref 4.22–5.81)
RDW: 14.4 % (ref 11.5–15.5)
WBC: 8.3 10*3/uL (ref 4.0–10.5)
nRBC: 0 % (ref 0.0–0.2)

## 2020-04-12 LAB — BASIC METABOLIC PANEL
Anion gap: 8 (ref 5–15)
BUN: 6 mg/dL (ref 6–20)
CO2: 29 mmol/L (ref 22–32)
Calcium: 9.2 mg/dL (ref 8.9–10.3)
Chloride: 102 mmol/L (ref 98–111)
Creatinine, Ser: 0.82 mg/dL (ref 0.61–1.24)
GFR, Estimated: >60 mL/min (ref >60)
Glucose, Bld: 101 mg/dL — ABNORMAL HIGH (ref 70–99)
Potassium: 4.5 mmol/L (ref 3.5–5.1)
Sodium: 139 mmol/L (ref 135–145)

## 2020-04-12 MED ORDER — SODIUM CHLORIDE 0.9 % IV BOLUS
1000.0000 mL | Freq: Once | INTRAVENOUS | Status: AC
  Administered 2020-04-12: 1000 mL via INTRAVENOUS

## 2020-04-12 NOTE — ED Triage Notes (Signed)
Pt arrives to ER via [facility] from [facility] for weakness. EMS reports care home stated he has had some recent changes to his medications. VSS with EMS.

## 2020-04-12 NOTE — ED Notes (Addendum)
Pt removed IV, all monitoring equipment. States "I just did" and laughs. MD aware

## 2020-04-12 NOTE — Discharge Instructions (Addendum)
Your labs and examination today were reassuring. Continue taking your medications and follow up with your doctor.

## 2020-04-12 NOTE — ED Provider Notes (Signed)
[facility] Emergency Department Provider Note  ____________________________________________  Time seen: Approximately 12:09 PM  I have reviewed the triage vital signs and the nursing notes.   HISTORY  Chief Complaint Weakness    Level 5 Caveat: Portions of the History and Physical including HPI and review of systems are unable to be completely obtained due to patient being a poor historian   HPI Russell Buchanan is a 44 y.o. male with a history of bipolar disorder, schizophrenia, GERD who was sent to the ED for evaluation due to reported generalized weakness after having some recent medication changes.  Patient denies any focal complaints.  No fevers chills vomiting cough chest pain shortness of breath or other pain complaints.  No falls or injuries.  Feels that his usual state of health.      Past Medical History:  Diagnosis Date  . Antisocial personality disorder (HCC)   . Bipolar disorder (HCC)   . Environmental and seasonal allergies   . GERD (gastroesophageal reflux disease)   . Headache   . Hyperlipidemia   . Infected elbow (HCC)   . Schizophrenia (HCC)   . Seizures (HCC)   . Tremors of nervous system      Patient Active Problem List   Diagnosis Date Noted  . Seizure (HCC) 01/10/2020  . AMS (altered mental status) 06/25/2019  . Bipolar disorder (HCC)   . Antisocial personality disorder (HCC)   . Acute encephalopathy 12/04/2016  . GERD (gastroesophageal reflux disease) 12/04/2016  . HLD (hyperlipidemia) 12/04/2016  . Schizophrenia (HCC) 11/15/2016  . Medication side effects 11/15/2016     Past Surgical History:  Procedure Laterality Date  . CLOSED REDUCTION WRIST FRACTURE Left   . HARDWARE REMOVAL Right 04/14/2018   Procedure: HARDWARE REMOVAL-RIGHT ELBOW;  Surgeon: Deeann Saint, MD;  Location: [facility] ORS;  Service: Orthopedics;  Laterality: Right;  . ORIF ELBOW FRACTURE Right   . SCALP LACERATION REPAIR    . WRIST RECONSTRUCTION  Left      Prior to Admission medications   Medication Sig Start Date End Date Taking? Authorizing Provider  albuterol (PROVENTIL HFA;VENTOLIN HFA) 108 (90 Base) MCG/ACT inhaler Inhale 2 puffs into the lungs every 6 (six) hours as needed for wheezing or shortness of breath. 01/30/17   Merrily Brittle, MD  benztropine (COGENTIN) 1 MG tablet Take 1 mg by mouth 3 (three) times daily.     [provider]  cetirizine (ZYRTEC) 10 MG tablet Take 10 mg by mouth daily.    [provider]  divalproex (DEPAKOTE) 500 MG DR tablet Take 1 tablet (500 mg total) by mouth every morning. 06/27/19 06/26/20  Darlin Priestly, MD  divalproex (DEPAKOTE) 500 MG DR tablet Take 2 tablets (1,000 mg total) by mouth at bedtime. 06/27/19   Darlin Priestly, MD  docusate sodium (COLACE) 100 MG capsule Take 100 mg by mouth daily.    [provider]  EPINEPHrine 0.3 mg/0.3 mL IJ SOAJ injection Inject 0.3 mg into the muscle as needed (allergic reactions).     [provider]  escitalopram (LEXAPRO) 20 MG tablet Take 20 mg by mouth daily.    [provider]  Fremanezumab-vfrm (AJOVY) 225 MG/1.5ML SOSY Inject into the skin every 28 (twenty-eight) days.    [provider]  furosemide (LASIX) 40 MG tablet Take 1 tablet (40 mg total) by mouth daily for 30 days. 06/25/18 01/10/20  Minna Antis, MD  gabapentin (NEURONTIN) 600 MG tablet Take 600 mg by mouth 3 (three) times daily.  [provider]  gemfibrozil (LOPID) 600 MG tablet Take 600 mg by mouth 2 (two) times daily before a meal.    [provider]  ketoconazole (NIZORAL) 2 % cream Apply 1 application topically 2 (two) times daily.    [provider]  Lacosamide (VIMPAT) 100 MG TABS Take 100 mg by mouth 2 (two) times daily.    [provider]  linaclotide (LINZESS) 72 MCG capsule Take 72 mcg by mouth daily.     [provider]  lipase/protease/amylase (CREON) 36000 UNITS CPEP capsule Take 36,000  Units by mouth 3 (three) times daily with meals. 2 caps withmeals and 1 with snacks    [provider]  meclizine (ANTIVERT) 25 MG tablet Take 1 tablet (25 mg total) by mouth 3 (three) times daily as needed for dizziness. 04/05/20   Delton Prairie, MD  meloxicam (MOBIC) 15 MG tablet Take 1 tablet (15 mg total) by mouth daily as needed for pain. 06/27/19   Darlin Priestly, MD  omeprazole (PRILOSEC) 40 MG capsule Take 40 mg by mouth 2 (two) times daily before a meal. 04/16/19   [provider]  ondansetron (ZOFRAN) 8 MG tablet Take 8 mg by mouth every 8 (eight) hours as needed for nausea. 06/16/19   [provider]  Paliperidone Palmitate ER (INVEGA TRINZA) 819 MG/2.625ML SUSY Inject into the muscle every 3 (three) months.    [provider]  polyethylene glycol (MIRALAX / GLYCOLAX) packet Take 17 g by mouth daily.    [provider]  potassium chloride SA (KLOR-CON) 20 MEQ tablet Take 20 mEq by mouth daily.    [provider]  pravastatin (PRAVACHOL) 40 MG tablet Take 40 mg by mouth every evening. 06/24/19   [provider]  propranolol ER (INDERAL LA) 160 MG SR capsule Take 160 mg by mouth daily.    [provider]  QUEtiapine (SEROQUEL) 100 MG tablet Take 100 mg by mouth at bedtime.  06/24/19   [provider]  simethicone (MYLICON) 80 MG chewable tablet Chew 80 mg by mouth 2 (two) times daily.    [provider]  SUMAtriptan (IMITREX) 100 MG tablet Take 100 mg by mouth every 2 (two) hours as needed for migraine. May repeat in 2 hours if headache persists or recurs.    [provider]  Tiotropium Bromide-Olodaterol (STIOLTO RESPIMAT IN) Inhale 2 puffs into the lungs daily.    [provider]     Allergies Bee venom   Family History  Family history unknown: Yes    Social History Social History   Tobacco Use  . Smoking status: Current Every Day Smoker    Packs/day: 0.50    Years: 7.00    Pack  years: 3.50    Types: Cigarettes  . Smokeless tobacco: Former Neurosurgeon    Types: Chew, Snuff  Vaping Use  . Vaping Use: Never used  Substance Use Topics  . Alcohol use: No  . Drug use: Not Currently    Review of Systems Level 5 Caveat: Portions of the History and Physical including HPI and review of systems are unable to be completely obtained due to patient being a poor historian   Constitutional:   No known fever.  ENT:   No rhinorrhea. Cardiovascular:   No chest pain or syncope. Respiratory:   No dyspnea or cough. Gastrointestinal:   Negative for abdominal pain, vomiting and diarrhea.  Musculoskeletal:   Negative for focal pain or swelling ____________________________________________   PHYSICAL EXAM:  VITAL SIGNS: ED Triage Vitals  Enc Vitals Group     BP 04/12/20 0915 99/78     Pulse Rate 04/12/20 0915 60     Resp 04/12/20 0915 14     Temp 04/12/20 0915 98 F (36.7 C)     Temp src --      SpO2 04/12/20 0915 96 %     Weight 04/12/20 0913 229 lb 4.5 oz (104 kg)     Height 04/12/20 0913 5\' 11"  (1.803 m)     Head Circumference --      Peak Flow --      Pain Score 04/12/20 0912 0     Pain Loc --      Pain Edu? --      Excl. in GC? --     Vital signs reviewed, nursing assessments reviewed.   Constitutional:   Alert and oriented. Non-toxic appearance.  Good spirits Eyes:   Conjunctivae are normal. EOMI. PERRL. ENT      Head:   Normocephalic and atraumatic.      Nose:   No congestion/rhinnorhea.       Mouth/Throat:   MMM, no pharyngeal erythema. No peritonsillar mass.       Neck:   No meningismus. Full ROM. Hematological/Lymphatic/Immunilogical:   No cervical lymphadenopathy. Cardiovascular:   RRR. Symmetric bilateral radial and DP pulses.  No murmurs. Cap refill less than 2 seconds. Respiratory:   Normal respiratory effort without tachypnea/retractions. Breath sounds are clear and equal bilaterally. No wheezes/rales/rhonchi. Gastrointestinal:   Soft and nontender.  Non distended. There is no CVA tenderness.  No rebound, rigidity, or guarding. Musculoskeletal:   Normal range of motion in all extremities. No joint effusions.  No lower extremity tenderness.  No edema. Neurologic:   Normal speech and language.  Motor grossly intact. No acute focal neurologic deficits are appreciated.  Skin:    Skin is warm, dry and intact. No rash noted.  No petechiae, purpura, or bullae.  ____________________________________________    LABS (pertinent positives/negatives) (all labs ordered are listed, but only abnormal results are displayed) Labs Reviewed  BASIC METABOLIC PANEL - Abnormal; Notable for the following components:      Result Value   Glucose, Bld 101 (*)    All other components within normal limits  CBC  URINALYSIS, COMPLETE (UACMP) WITH MICROSCOPIC  CBG MONITORING, ED   ____________________________________________   EKG  Interpreted by me Sinus rhythm rate of 62, normal axis and intervals.  Normal QRS ST segments and T waves.  ____________________________________________    RADIOLOGY  No results found.  ____________________________________________   PROCEDURES Procedures  ____________________________________________  DIFFERENTIAL DIAGNOSIS   Dehydration, electrolyte abnormality, medication side effect, sleep deprivation  CLINICAL IMPRESSION / ASSESSMENT AND PLAN / ED COURSE  Medications ordered in the ED: Medications  sodium chloride 0.9 % bolus 1,000 mL (0 mLs Intravenous Stopped 04/12/20 1106)    Pertinent labs & imaging results that were available during my care of the patient were reviewed by me and considered in my medical decision making (see chart for details).   Russell Buchanan was evaluated in Emergency Department on 04/12/2020 for the symptoms described in the history of present illness. He was evaluated in the context of the global COVID-19 pandemic, which necessitated consideration that the patient might be at risk  for infection with the SARS-CoV-2 virus that causes COVID-19. Institutional protocols and algorithms that pertain to the evaluation of patients at risk for COVID-19 are in a state of rapid change based  on information released by regulatory bodies including the CDC and federal and state organizations. These policies and algorithms were followed during the patient's care in the ED.   Patient presents with reported weakness today.  Symptoms are nonfocal, exam is nonfocal, vital signs are normal.  Labs are reassuring.  EKG unremarkable.  Patient given some IV fluids for hydration, appears well.  He did pull his IV out and at this point I think that discharging him to outpatient follow-up would be the best course of action.  He can continue home medications until then.      ____________________________________________   FINAL CLINICAL IMPRESSION(S) / ED DIAGNOSES    Final diagnoses:  Fatigue, unspecified type     ED Discharge Orders    None      Portions of this note were generated with dragon dictation software. Dictation errors may occur despite best attempts at proofreading.   Sharman Cheek, MD 04/12/20 1212

## 2020-04-12 NOTE — ED Notes (Signed)
Georgina Snell at [facility] for transportation for pt. Per Lucendia Herrlich pt is ok to send via [employer]. Pt had pulled out IV earlier and pants wet. Pt refused to let this RN change his clothes into dry ones.  Pt verbalized discharge instructions. Pt assisted to wheelchair. Pt taken out to triage and first RN notified of pt waiting for cab ride home.

## 2020-04-17 ENCOUNTER — Emergency Department
Admission: EM | Admit: 2020-04-17 | Discharge: 2020-04-17 | Disposition: A | Discharge status: Home / Self Care | Payer: Medicaid Other | Attending: Student in an Organized Health Care Education/Training Program | Admitting: Student in an Organized Health Care Education/Training Program

## 2020-04-17 ENCOUNTER — Other Ambulatory Visit: Payer: Self-pay

## 2020-04-17 ENCOUNTER — Emergency Department: Payer: Medicaid Other

## 2020-04-17 DIAGNOSIS — M542 Cervicalgia: Secondary | ICD-10-CM | POA: Diagnosis not present

## 2020-04-17 DIAGNOSIS — F17210 Nicotine dependence, cigarettes, uncomplicated: Secondary | ICD-10-CM | POA: Insufficient documentation

## 2020-04-17 DIAGNOSIS — Y92129 Unspecified place in nursing home as the place of occurrence of the external cause: Secondary | ICD-10-CM | POA: Insufficient documentation

## 2020-04-17 DIAGNOSIS — M545 Low back pain, unspecified: Secondary | ICD-10-CM | POA: Diagnosis not present

## 2020-04-17 DIAGNOSIS — W19XXXA Unspecified fall, initial encounter: Secondary | ICD-10-CM | POA: Insufficient documentation

## 2020-04-17 DIAGNOSIS — M549 Dorsalgia, unspecified: Secondary | ICD-10-CM | POA: Diagnosis present

## 2020-04-17 LAB — COMPREHENSIVE METABOLIC PANEL
ALT: 10 U/L (ref 0–44)
AST: 25 U/L (ref 15–41)
Albumin: 3.2 g/dL — ABNORMAL LOW (ref 3.5–5.0)
Alkaline Phosphatase: 84 U/L (ref 38–126)
Anion gap: 12 (ref 5–15)
BUN: 15 mg/dL (ref 6–20)
CO2: 29 mmol/L (ref 22–32)
Calcium: 8.5 mg/dL — ABNORMAL LOW (ref 8.9–10.3)
Chloride: 97 mmol/L — ABNORMAL LOW (ref 98–111)
Creatinine, Ser: 0.84 mg/dL (ref 0.61–1.24)
GFR, Estimated: >60 mL/min (ref >60)
Glucose, Bld: 130 mg/dL — ABNORMAL HIGH (ref 70–99)
Potassium: 3.4 mmol/L — ABNORMAL LOW (ref 3.5–5.1)
Sodium: 138 mmol/L (ref 135–145)
Total Bilirubin: 1.1 mg/dL (ref 0.3–1.2)
Total Protein: 6.5 g/dL (ref 6.5–8.1)

## 2020-04-17 LAB — CBC
HCT: 42.3 % (ref 39.0–52.0)
Hemoglobin: 14.4 g/dL (ref 13.0–17.0)
MCH: 30.6 pg (ref 26.0–34.0)
MCHC: 34 g/dL (ref 30.0–36.0)
MCV: 90 fL (ref 80.0–100.0)
Platelets: 133 10*3/uL — ABNORMAL LOW (ref 150–400)
RBC: 4.7 MIL/uL (ref 4.22–5.81)
RDW: 13.4 % (ref 11.5–15.5)
WBC: 5.5 10*3/uL (ref 4.0–10.5)
nRBC: 0 % (ref 0.0–0.2)

## 2020-04-17 LAB — TROPONIN I (HIGH SENSITIVITY): Troponin I (High Sensitivity): 3 ng/L (ref <18)

## 2020-04-17 LAB — ACETAMINOPHEN LEVEL: Acetaminophen (Tylenol), Serum: <10 ug/mL — ABNORMAL LOW (ref 10–30)

## 2020-04-17 LAB — SALICYLATE LEVEL: Salicylate Lvl: <7 mg/dL — ABNORMAL LOW (ref 7.0–30.0)

## 2020-04-17 MED ORDER — LIDOCAINE 5 % EX PTCH: 1.0000 | MEDICATED_PATCH | CUTANEOUS | 0 refills | Status: DC

## 2020-04-17 NOTE — ED Notes (Signed)
Ambulatory from EMS bay to the room. Pt c/o lower back pain from mechanical fall yesterday. Per facility, pt has frequent falls. Pt is alert but confused but same as baseline.

## 2020-04-17 NOTE — ED Notes (Signed)
Cab requested for patient. Pt resides at [facility], assisted manager has an account with cab company.

## 2020-04-17 NOTE — ED Notes (Signed)
Pt placed in lobby to await cab. First nurse and triage nurses notified. Per [employer]- will be approx ten minutes.

## 2020-04-17 NOTE — ED Triage Notes (Signed)
Reported via [facility] from [facility]. States that he fell yesterday and has lower back pain. Facility states patient has frequent falls and is altered at baseline.

## 2020-04-17 NOTE — ED Provider Notes (Signed)
[facility] Emergency Department Provider Note  ____________________________________________  Time seen: Approximately 11:43 AM  I have reviewed the triage vital signs and the nursing notes.   HISTORY  Chief Complaint Back Pain and Fall    HPI Russell Buchanan is a 44 y.o. male with PMH of antisocial personality disorder, bipolar disorder, schizophrenia, seizures that presents to the emergency department today for evaluation after a fall yesterday. He lives at [facility] who reported to EMS that patient fell yesterday and has been complaining of back pain after a fall yesterday where he landed on his right side into a cabinet. His back is not hurting currently but was earlier.   He also states that his neck is sore. He is tired. He also states that he took his medicine this morning and felt dizzy afterwards.  No SI or HI.  He has been up walking.   Past Medical History:  Diagnosis Date  . Antisocial personality disorder (HCC)   . Bipolar disorder (HCC)   . Environmental and seasonal allergies   . GERD (gastroesophageal reflux disease)   . Headache   . Hyperlipidemia   . Infected elbow (HCC)   . Schizophrenia (HCC)   . Seizures (HCC)   . Tremors of nervous system     Patient Active Problem List   Diagnosis Date Noted  . Seizure (HCC) 01/10/2020  . AMS (altered mental status) 06/25/2019  . Bipolar disorder (HCC)   . Antisocial personality disorder (HCC)   . Acute encephalopathy 12/04/2016  . GERD (gastroesophageal reflux disease) 12/04/2016  . HLD (hyperlipidemia) 12/04/2016  . Schizophrenia (HCC) 11/15/2016  . Medication side effects 11/15/2016    Past Surgical History:  Procedure Laterality Date  . CLOSED REDUCTION WRIST FRACTURE Left   . HARDWARE REMOVAL Right 04/14/2018   Procedure: HARDWARE REMOVAL-RIGHT ELBOW;  Surgeon: Deeann Saint, MD;  Location: [facility] ORS;  Service: Orthopedics;  Laterality: Right;  . ORIF ELBOW FRACTURE Right   . SCALP  LACERATION REPAIR    . WRIST RECONSTRUCTION Left     Prior to Admission medications   Medication Sig Start Date End Date Taking? Authorizing Provider  albuterol (PROVENTIL HFA;VENTOLIN HFA) 108 (90 Base) MCG/ACT inhaler Inhale 2 puffs into the lungs every 6 (six) hours as needed for wheezing or shortness of breath. 01/30/17   Merrily Brittle, MD  benztropine (COGENTIN) 1 MG tablet Take 1 mg by mouth 3 (three) times daily.     [provider]  cetirizine (ZYRTEC) 10 MG tablet Take 10 mg by mouth daily.    [provider]  divalproex (DEPAKOTE) 500 MG DR tablet Take 1 tablet (500 mg total) by mouth every morning. 06/27/19 06/26/20  Darlin Priestly, MD  divalproex (DEPAKOTE) 500 MG DR tablet Take 2 tablets (1,000 mg total) by mouth at bedtime. 06/27/19   Darlin Priestly, MD  docusate sodium (COLACE) 100 MG capsule Take 100 mg by mouth daily.    [provider]  EPINEPHrine 0.3 mg/0.3 mL IJ SOAJ injection Inject 0.3 mg into the muscle as needed (allergic reactions).     [provider]  escitalopram (LEXAPRO) 20 MG tablet Take 20 mg by mouth daily.    [provider]  Fremanezumab-vfrm (AJOVY) 225 MG/1.5ML SOSY Inject into the skin every 28 (twenty-eight) days.    [provider]  furosemide (LASIX) 40 MG tablet Take 1 tablet (40 mg total) by mouth daily for 30 days. 06/25/18 01/10/20  Minna Antis, MD  gabapentin (NEURONTIN) 600 MG tablet Take  600 mg by mouth 3 (three) times daily.    [provider]  gemfibrozil (LOPID) 600 MG tablet Take 600 mg by mouth 2 (two) times daily before a meal.    [provider]  ketoconazole (NIZORAL) 2 % cream Apply 1 application topically 2 (two) times daily.    [provider]  Lacosamide (VIMPAT) 100 MG TABS Take 100 mg by mouth 2 (two) times daily.    [provider]  lidocaine (LIDODERM) 5 % Place 1 patch onto the skin daily. Remove & Discard patch within 12 hours or as directed by MD  04/17/20   Enid Derry, PA-C  linaclotide [facility]) 72 MCG capsule Take 72 mcg by mouth daily.     [provider]  lipase/protease/amylase (CREON) 36000 UNITS CPEP capsule Take 36,000 Units by mouth 3 (three) times daily with meals. 2 caps withmeals and 1 with snacks    [provider]  meclizine (ANTIVERT) 25 MG tablet Take 1 tablet (25 mg total) by mouth 3 (three) times daily as needed for dizziness. 04/05/20   Delton Prairie, MD  meloxicam (MOBIC) 15 MG tablet Take 1 tablet (15 mg total) by mouth daily as needed for pain. 06/27/19   Darlin Priestly, MD  omeprazole (PRILOSEC) 40 MG capsule Take 40 mg by mouth 2 (two) times daily before a meal. 04/16/19   [provider]  ondansetron (ZOFRAN) 8 MG tablet Take 8 mg by mouth every 8 (eight) hours as needed for nausea. 06/16/19   [provider]  Paliperidone Palmitate ER (INVEGA TRINZA) 819 MG/2.625ML SUSY Inject into the muscle every 3 (three) months.    [provider]  polyethylene glycol (MIRALAX / GLYCOLAX) packet Take 17 g by mouth daily.    [provider]  potassium chloride SA (KLOR-CON) 20 MEQ tablet Take 20 mEq by mouth daily.    [provider]  pravastatin (PRAVACHOL) 40 MG tablet Take 40 mg by mouth every evening. 06/24/19   [provider]  propranolol ER (INDERAL LA) 160 MG SR capsule Take 160 mg by mouth daily.    [provider]  QUEtiapine (SEROQUEL) 100 MG tablet Take 100 mg by mouth at bedtime.  06/24/19   [provider]  simethicone (MYLICON) 80 MG chewable tablet Chew 80 mg by mouth 2 (two) times daily.    [provider]  SUMAtriptan (IMITREX) 100 MG tablet Take 100 mg by mouth every 2 (two) hours as needed for migraine. May repeat in 2 hours if headache persists or recurs.    [provider]  Tiotropium Bromide-Olodaterol (STIOLTO RESPIMAT IN) Inhale 2 puffs into the lungs daily.    [provider]    Allergies Bee  venom  Family History  Family history unknown: Yes    Social History Social History   Tobacco Use  . Smoking status: Current Every Day Smoker    Packs/day: 0.50    Years: 7.00    Pack years: 3.50    Types: Cigarettes  . Smokeless tobacco: Former Neurosurgeon    Types: Chew, Snuff  Vaping Use  . Vaping Use: Never used  Substance Use Topics  . Alcohol use: No  . Drug use: Not Currently     Review of Systems  Cardiovascular: No chest pain. Respiratory: No cough. No SOB. Gastrointestinal: No abdominal pain.  No nausea, no vomiting.  Musculoskeletal: Positive for back pain. Skin: Negative for rash, abrasions, lacerations, ecchymosis. Neurological: Negative for headaches   ____________________________________________   PHYSICAL  EXAM:  VITAL SIGNS: ED Triage Vitals  Enc Vitals Group     BP 04/17/20 0948 124/79     Pulse Rate 04/17/20 0948 60     Resp 04/17/20 0948 18     Temp 04/17/20 0948 97.9 F (36.6 C)     Temp Source 04/17/20 0948 Oral     SpO2 04/17/20 0948 98 %     Weight --      Height --      Head Circumference --      Peak Flow --      Pain Score 04/17/20 0949 7     Pain Loc --      Pain Edu? --      Excl. in GC? --      Constitutional: Alert and oriented. Eyes: Conjunctivae are normal. PERRL. EOMI. Head: Atraumatic. ENT:      Ears:      Nose: No congestion/rhinnorhea.      Mouth/Throat: Mucous membranes are moist.  Neck: No stridor.  No cervical spine tenderness to palpation. Cardiovascular: Normal rate, regular rhythm.  Good peripheral circulation. Respiratory: Normal respiratory effort without tachypnea or retractions. Lungs CTAB. Good air entry to the bases with no decreased or absent breath sounds. Gastrointestinal: Bowel sounds 4 quadrants. Soft and nontender to palpation. No guarding or rigidity. No palpable masses. No distention.   Musculoskeletal: Full range of motion to all extremities. No gross deformities appreciated. No tenderness to  palpation to lumbar spine.  Full range of motion of bilateral hips.  Ambulating in the emergency room without assistance. Neurologic:  Normal speech and language. No gross focal neurologic deficits are appreciated.  Skin:  Skin is warm, dry and intact. No rash noted.  ____________________________________________   LABS (all labs ordered are listed, but only abnormal results are displayed)  Labs Reviewed  CBC - Abnormal; Notable for the following components:      Result Value   Platelets 133 (*)    All other components within normal limits  COMPREHENSIVE METABOLIC PANEL - Abnormal; Notable for the following components:   Potassium 3.4 (*)    Chloride 97 (*)    Glucose, Bld 130 (*)    Calcium 8.5 (*)    Albumin 3.2 (*)    All other components within normal limits  ACETAMINOPHEN LEVEL - Abnormal; Notable for the following components:   Acetaminophen (Tylenol), Serum <10 (*)    All other components within normal limits  SALICYLATE LEVEL - Abnormal; Notable for the following components:   Salicylate Lvl <7.0 (*)    All other components within normal limits  URINALYSIS, COMPLETE (UACMP) WITH MICROSCOPIC  TROPONIN I (HIGH SENSITIVITY)  TROPONIN I (HIGH SENSITIVITY)   ____________________________________________  EKG   ____________________________________________  RADIOLOGY  DG Lumbar Spine 2-3 Views  Result Date: 04/17/2020 CLINICAL DATA:  Pain following fall EXAM: LUMBAR SPINE - 2-3 VIEW COMPARISON:  None. FINDINGS: Frontal, lateral, and spot lumbosacral lateral images were obtained. There are 5 non-rib-bearing lumbar type vertebral bodies. There is mild thoracolumbar levoscoliosis. There is no fracture or spondylolisthesis. There is mild disc space narrowing at L5-S1. Other disc spaces appear unremarkable. No erosive change. There is aortic atherosclerosis. IMPRESSION: Mild disc space narrowing at L5-S1. Other disc spaces appear unremarkable. There is mild thoracolumbar  levoscoliosis. No fracture or spondylolisthesis. Electronically Signed   By: Bretta Bang III M.D.   On: 04/17/2020 14:01   CT Head Wo Contrast  Result Date: 04/17/2020 CLINICAL DATA:  Head trauma. EXAM: CT HEAD  WITHOUT CONTRAST TECHNIQUE: Contiguous axial images were obtained from the base of the skull through the vertex without intravenous contrast. COMPARISON:  April 05, 2020 FINDINGS: Brain: No evidence of acute infarction, hemorrhage, hydrocephalus, extra-axial collection or mass lesion/mass effect. Vascular: No hyperdense vessel or unexpected calcification. Skull: Normal. Negative for fracture or focal lesion. Sinuses/Orbits: No acute finding. Other: None. IMPRESSION: No acute intracranial abnormality. Electronically Signed   By: Ted Mcalpine M.D.   On: 04/17/2020 12:18   CT Cervical Spine Wo Contrast  Result Date: 04/17/2020 CLINICAL DATA:  Neck trauma.  Midline tenderness. EXAM: CT CERVICAL SPINE WITHOUT CONTRAST TECHNIQUE: Multidetector CT imaging of the cervical spine was performed without intravenous contrast. Multiplanar CT image reconstructions were also generated. COMPARISON:  None. FINDINGS: Alignment: Normal. Skull base and vertebrae: No acute fracture. No primary bone lesion or focal pathologic process. Soft tissues and spinal canal: No prevertebral fluid or swelling. No visible canal hematoma. Disc levels:  Normal. Upper chest: Negative. Other: None. IMPRESSION: No evidence of acute traumatic injury to the cervical spine. Electronically Signed   By: Ted Mcalpine M.D.   On: 04/17/2020 12:24   DG Hip Unilat W or Wo Pelvis 2-3 Views Right  Result Date: 04/17/2020 CLINICAL DATA:  Pain following fall EXAM: DG HIP (WITH OR WITHOUT PELVIS) 2-3V RIGHT COMPARISON:  None. FINDINGS: Frontal pelvis as well as frontal and lateral left hip images were obtained. No fracture or dislocation. There is mild symmetric narrowing of each hip joint. No erosive change. Sacroiliac joints  appear normal bilaterally. IMPRESSION: No fracture or dislocation. Slight symmetric narrowing each hip joint. Electronically Signed   By: Bretta Bang III M.D.   On: 04/17/2020 14:02    ____________________________________________    PROCEDURES  Procedure(s) performed:    Procedures    Medications - No data to display   ____________________________________________   INITIAL IMPRESSION / ASSESSMENT AND PLAN / ED COURSE  Pertinent labs & imaging results that were available during my care of the patient were reviewed by me and considered in my medical decision making (see chart for details).  Review of the [place] CSRS was performed in accordance of the [facility] prior to dispensing any controlled drugs.     Patient presents to emergency department for evaluation after a fall last night.  Patient is unable to provide much history surrounding the details of the fall.  ----------------------------------------- 10:00 AM on 04/17/2020 -----------------------------------------  I have tried to contact patient's contacts Lawanda and [employer], without any answer.  ----------------------------------------- 11:51 AM on 04/17/2020 -----------------------------------------  Still have been unable to get a hold of patient's contact Ms. Lawanda by the phone number listed in the computer.  Lab work and CT scans are largely unremarkable.  ----------------------------------------- 2:00 PM on 04/17/2020 -----------------------------------------  Lumbar x-ray and hip x-ray are negative for acute bony abnormalities.    Nursing staff was able to get a hold of [facility] where patient lives.  [facility] reports that patient fell last night into a cabinet on his right side.  Patient complained to them of low back pain so they brought him to the emergency department to be evaluated.  They have no additional concerns.  ----------------------------------------- 2:15 PM on  04/17/2020 -----------------------------------------  Patient states that he is feeling better and would like to go home.  Patient is up walking in the emergency department.  He is not complaining of any pain currently.  Patient is to follow up with primary care as directed. Patient is given ED precautions to  return to the ED for any worsening or new symptoms.  Russell Buchanan was evaluated in Emergency Department on 04/17/2020 for the symptoms described in the history of present illness. He was evaluated in the context of the global COVID-19 pandemic, which necessitated consideration that the patient might be at risk for infection with the SARS-CoV-2 virus that causes COVID-19. Institutional protocols and algorithms that pertain to the evaluation of patients at risk for COVID-19 are in a state of rapid change based on information released by regulatory bodies including the CDC and federal and state organizations. These policies and algorithms were followed during the patient's care in the ED.   ____________________________________________  FINAL CLINICAL IMPRESSION(S) / ED DIAGNOSES  Final diagnoses:  Fall, initial encounter      NEW MEDICATIONS STARTED DURING THIS VISIT:  ED Discharge Orders         Ordered    lidocaine (LIDODERM) 5 %  Every 24 hours        04/17/20 1507              This chart was dictated using voice recognition software/Dragon. Despite best efforts to proofread, errors can occur which can change the meaning. Any change was purely unintentional.    Enid Derry, PA-C 04/17/20 1630    Willy Eddy, MD 04/17/20 [phone]

## 2020-05-09 ENCOUNTER — Other Ambulatory Visit: Payer: Self-pay | Admitting: Neurology

## 2020-05-09 DIAGNOSIS — M545 Low back pain, unspecified: Secondary | ICD-10-CM

## 2020-05-19 ENCOUNTER — Other Ambulatory Visit: Payer: Self-pay

## 2020-05-19 ENCOUNTER — Ambulatory Visit
Admission: RE | Admit: 2020-05-19 | Discharge: 2020-05-19 | Disposition: A | Discharge status: Home / Self Care | Payer: Medicaid Other | Source: Ambulatory Visit | Attending: Neurology | Admitting: Neurology

## 2020-05-19 DIAGNOSIS — G8929 Other chronic pain: Secondary | ICD-10-CM | POA: Diagnosis present

## 2020-05-19 DIAGNOSIS — M545 Low back pain, unspecified: Secondary | ICD-10-CM | POA: Diagnosis present

## 2020-05-21 ENCOUNTER — Emergency Department: Payer: Medicaid Other

## 2020-05-21 ENCOUNTER — Encounter: Payer: Self-pay | Admitting: Emergency Medicine

## 2020-05-21 ENCOUNTER — Other Ambulatory Visit: Payer: Self-pay

## 2020-05-21 ENCOUNTER — Emergency Department
Admission: EM | Admit: 2020-05-21 | Discharge: 2020-05-21 | Disposition: A | Discharge status: Home / Self Care | Payer: Medicaid Other | Attending: Emergency Medicine | Admitting: Emergency Medicine

## 2020-05-21 DIAGNOSIS — W1830XA Fall on same level, unspecified, initial encounter: Secondary | ICD-10-CM | POA: Diagnosis not present

## 2020-05-21 DIAGNOSIS — F17210 Nicotine dependence, cigarettes, uncomplicated: Secondary | ICD-10-CM | POA: Insufficient documentation

## 2020-05-21 DIAGNOSIS — R0781 Pleurodynia: Secondary | ICD-10-CM | POA: Diagnosis present

## 2020-05-21 DIAGNOSIS — W19XXXA Unspecified fall, initial encounter: Secondary | ICD-10-CM

## 2020-05-21 NOTE — ED Triage Notes (Addendum)
Pt arrived via [facility] from [facility].  Per EMS pt fell onto R side and c/o R rib pain. Reports mechanical fall.    Pt has cognition problem at baseline. Frequent falls at [facility]

## 2020-05-21 NOTE — ED Provider Notes (Signed)
[facility] Emergency Department Provider Note  ____________________________________________   Event Date/Time   First MD Initiated Contact with Patient 05/21/20 1039     (approximate)  I have reviewed the triage vital signs and the nursing notes.   HISTORY  Chief Complaint Fall    HPI Ancil Dewan is a 44 y.o. male presents to the ER via EMS from a group home.  Patient states that he fell and landed on his right ribs earlier today.  He has no other complaints.  HPI is limited due to his cognitive disability.    Past Medical History:  Diagnosis Date  . Antisocial personality disorder (HCC)   . Bipolar disorder (HCC)   . Environmental and seasonal allergies   . GERD (gastroesophageal reflux disease)   . Headache   . Hyperlipidemia   . Infected elbow (HCC)   . Schizophrenia (HCC)   . Seizures (HCC)   . Tremors of nervous system     Patient Active Problem List   Diagnosis Date Noted  . Seizure (HCC) 01/10/2020  . AMS (altered mental status) 06/25/2019  . Bipolar disorder (HCC)   . Antisocial personality disorder (HCC)   . Acute encephalopathy 12/04/2016  . GERD (gastroesophageal reflux disease) 12/04/2016  . HLD (hyperlipidemia) 12/04/2016  . Schizophrenia (HCC) 11/15/2016  . Medication side effects 11/15/2016    Past Surgical History:  Procedure Laterality Date  . CLOSED REDUCTION WRIST FRACTURE Left   . HARDWARE REMOVAL Right 04/14/2018   Procedure: HARDWARE REMOVAL-RIGHT ELBOW;  Surgeon: Deeann Saint, MD;  Location: [facility] ORS;  Service: Orthopedics;  Laterality: Right;  . ORIF ELBOW FRACTURE Right   . SCALP LACERATION REPAIR    . WRIST RECONSTRUCTION Left     Prior to Admission medications   Medication Sig Start Date End Date Taking? Authorizing Provider  albuterol (PROVENTIL HFA;VENTOLIN HFA) 108 (90 Base) MCG/ACT inhaler Inhale 2 puffs into the lungs every 6 (six) hours as needed for wheezing or shortness of breath. 01/30/17    Merrily Brittle, MD  benztropine (COGENTIN) 1 MG tablet Take 1 mg by mouth 3 (three) times daily.     [provider]  cetirizine (ZYRTEC) 10 MG tablet Take 10 mg by mouth daily.    [provider]  divalproex (DEPAKOTE) 500 MG DR tablet Take 1 tablet (500 mg total) by mouth every morning. 06/27/19 06/26/20  Darlin Priestly, MD  divalproex (DEPAKOTE) 500 MG DR tablet Take 2 tablets (1,000 mg total) by mouth at bedtime. 06/27/19   Darlin Priestly, MD  docusate sodium (COLACE) 100 MG capsule Take 100 mg by mouth daily.    [provider]  EPINEPHrine 0.3 mg/0.3 mL IJ SOAJ injection Inject 0.3 mg into the muscle as needed (allergic reactions).     [provider]  escitalopram (LEXAPRO) 20 MG tablet Take 20 mg by mouth daily.    [provider]  Fremanezumab-vfrm (AJOVY) 225 MG/1.5ML SOSY Inject into the skin every 28 (twenty-eight) days.    [provider]  furosemide (LASIX) 40 MG tablet Take 1 tablet (40 mg total) by mouth daily for 30 days. 06/25/18 01/10/20  Minna Antis, MD  gabapentin (NEURONTIN) 600 MG tablet Take 600 mg by mouth 3 (three) times daily.    [provider]  gemfibrozil (LOPID) 600 MG tablet Take 600 mg by mouth 2 (two) times daily before a meal.    [provider]  ketoconazole (NIZORAL) 2 % cream Apply 1 application topically 2 (two) times daily.  [provider]  Lacosamide (VIMPAT) 100 MG TABS Take 100 mg by mouth 2 (two) times daily.    [provider]  lidocaine (LIDODERM) 5 % Place 1 patch onto the skin daily. Remove & Discard patch within 12 hours or as directed by MD 04/17/20   Enid Derry, PA-C  linaclotide [facility]) 72 MCG capsule Take 72 mcg by mouth daily.     [provider]  lipase/protease/amylase (CREON) 36000 UNITS CPEP capsule Take 36,000 Units by mouth 3 (three) times daily with meals. 2 caps withmeals and 1 with snacks    [provider]  meclizine (ANTIVERT)  25 MG tablet Take 1 tablet (25 mg total) by mouth 3 (three) times daily as needed for dizziness. 04/05/20   Delton Prairie, MD  meloxicam (MOBIC) 15 MG tablet Take 1 tablet (15 mg total) by mouth daily as needed for pain. 06/27/19   Darlin Priestly, MD  omeprazole (PRILOSEC) 40 MG capsule Take 40 mg by mouth 2 (two) times daily before a meal. 04/16/19   [provider]  ondansetron (ZOFRAN) 8 MG tablet Take 8 mg by mouth every 8 (eight) hours as needed for nausea. 06/16/19   [provider]  Paliperidone Palmitate ER (INVEGA TRINZA) 819 MG/2.625ML SUSY Inject into the muscle every 3 (three) months.    [provider]  polyethylene glycol (MIRALAX / GLYCOLAX) packet Take 17 g by mouth daily.    [provider]  potassium chloride SA (KLOR-CON) 20 MEQ tablet Take 20 mEq by mouth daily.    [provider]  pravastatin (PRAVACHOL) 40 MG tablet Take 40 mg by mouth every evening. 06/24/19   [provider]  propranolol ER (INDERAL LA) 160 MG SR capsule Take 160 mg by mouth daily.    [provider]  QUEtiapine (SEROQUEL) 100 MG tablet Take 100 mg by mouth at bedtime.  06/24/19   [provider]  simethicone (MYLICON) 80 MG chewable tablet Chew 80 mg by mouth 2 (two) times daily.    [provider]  SUMAtriptan (IMITREX) 100 MG tablet Take 100 mg by mouth every 2 (two) hours as needed for migraine. May repeat in 2 hours if headache persists or recurs.    [provider]  Tiotropium Bromide-Olodaterol (STIOLTO RESPIMAT IN) Inhale 2 puffs into the lungs daily.    [provider]    Allergies Bee venom  Family History  Family history unknown: Yes    Social History Social History   Tobacco Use  . Smoking status: Current Every Day Smoker    Packs/day: 0.50    Years: 7.00    Pack years: 3.50    Types: Cigarettes  . Smokeless tobacco: Former Neurosurgeon    Types: Chew, Snuff  Vaping Use  . Vaping Use: Never used   Substance Use Topics  . Alcohol use: No  . Drug use: Not Currently    Review of Systems Limited due to his cognitive disability Constitutional: No fever/chills Eyes: No visual changes. ENT: No sore throat. Respiratory: Denies cough Cardiovascular: Denies chest pain Gastrointestinal: Denies abdominal pain Genitourinary: Negative for dysuria. Musculoskeletal: Negative for back pain. Skin: Negative for rash. Psychiatric: no mood changes,     ____________________________________________   PHYSICAL EXAM:  VITAL SIGNS: ED Triage Vitals  Enc Vitals Group     BP 05/21/20 0941 101/75     Pulse Rate 05/21/20 0941 75     Resp 05/21/20 0941 18     Temp 05/21/20 0941 98.4 F (  36.9 C)     Temp Source 05/21/20 0941 Oral     SpO2 05/21/20 0941 99 %     Weight 05/21/20 0942 229 lb 4.5 oz (104 kg)     Height 05/21/20 0942 5\' 11"  (1.803 m)     Head Circumference --      Peak Flow --      Pain Score --      Pain Loc --      Pain Edu? --      Excl. in GC? --     Constitutional: Alert and oriented. Well appearing and in no acute distress. Eyes: Conjunctivae are normal.  Head: Atraumatic. Nose: No congestion/rhinnorhea. Mouth/Throat: Mucous membranes are moist.   Neck:  supple no lymphadenopathy noted Cardiovascular: Normal rate, regular rhythm. Heart sounds are normal Respiratory: Normal respiratory effort.  No retractions, lungs c t a, ribs are nontender Abd: soft nontender bs normal all 4 quad, no bruising around GU: deferred Musculoskeletal: FROM all extremities, warm and well perfused Neurologic:  Normal speech and language.  Skin:  Skin is warm, dry and intact. No rash noted. Psychiatric: Mood and affect are normal. Speech and behavior are normal.  ____________________________________________   LABS (all labs ordered are listed, but only abnormal results are displayed)  Labs Reviewed - No data to  display ____________________________________________   ____________________________________________  RADIOLOGY  Chest x-ray is normal  ____________________________________________   PROCEDURES  Procedure(s) performed: No  Procedures    ____________________________________________   INITIAL IMPRESSION / ASSESSMENT AND PLAN / ED COURSE  Pertinent labs & imaging results that were available during my care of the patient were reviewed by me and considered in my medical decision making (see chart for details).   Patient is 44 year old male presents from group home with cognitive disorder and mechanical fall.  See HPI.  Physical exam shows patient to be able to ambulate around the flex area of the ER without any difficulty.  Chest x-ray was negative for any acute abnormalities.  The patient appears to be well and in no distress.  Cognitive disability is at baseline.  He will be discharged back to the group home with instructions to take Tylenol or ibuprofen for pain as needed.     Kali Ambler was evaluated in Emergency Department on 05/21/2020 for the symptoms described in the history of present illness. He was evaluated in the context of the global COVID-19 pandemic, which necessitated consideration that the patient might be at risk for infection with the SARS-CoV-2 virus that causes COVID-19. Institutional protocols and algorithms that pertain to the evaluation of patients at risk for COVID-19 are in a state of rapid change based on information released by regulatory bodies including the CDC and federal and state organizations. These policies and algorithms were followed during the patient's care in the ED.    As part of my medical decision making, I reviewed the following data within the electronic MEDICAL RECORD NUMBER Nursing notes reviewed and incorporated, Old chart reviewed, Radiograph reviewed , Notes from prior ED visits and [place] Controlled Substance  Database  ____________________________________________   FINAL CLINICAL IMPRESSION(S) / ED DIAGNOSES  Final diagnoses:  Fall, initial encounter      NEW MEDICATIONS STARTED DURING THIS VISIT:  New Prescriptions   No medications on file     Note:  This document was prepared using Dragon voice recognition software and may include unintentional dictation errors.    05/23/2020, PA-C 05/21/20 1113    05/23/20, MD  05/22/20 0751  

## 2020-05-21 NOTE — ED Notes (Signed)
[facility] [employer] called per [facility] family care [facility] staff. Taxi to arrive in 45- 60 minutes. Pt to stay in room until taxi arrives for safety.

## 2020-05-21 NOTE — ED Notes (Signed)
Pt wheeled to lobby, placed in [employer]. Paperwork sent with pt. Report given to [facility] family care home staff.

## 2020-05-21 NOTE — Discharge Instructions (Addendum)
Take otc tylenol or ibuprofen for pain every 4-6 hours as needed

## 2020-05-21 NOTE — ED Notes (Addendum)
Pt up walking in hall, asking for phone to call cab company. Pt alert, returned to room. Pt given ginger ale

## 2020-05-29 ENCOUNTER — Emergency Department
Admission: EM | Admit: 2020-05-29 | Discharge: 2020-05-29 | Disposition: A | Discharge status: Home / Self Care | Payer: Medicaid Other | Attending: Student in an Organized Health Care Education/Training Program | Admitting: Student in an Organized Health Care Education/Training Program

## 2020-05-29 ENCOUNTER — Encounter: Payer: Self-pay | Admitting: Medical Oncology

## 2020-05-29 ENCOUNTER — Other Ambulatory Visit: Payer: Self-pay

## 2020-05-29 ENCOUNTER — Emergency Department: Payer: Medicaid Other

## 2020-05-29 DIAGNOSIS — R569 Unspecified convulsions: Secondary | ICD-10-CM | POA: Diagnosis present

## 2020-05-29 DIAGNOSIS — W01198A Fall on same level from slipping, tripping and stumbling with subsequent striking against other object, initial encounter: Secondary | ICD-10-CM | POA: Diagnosis not present

## 2020-05-29 DIAGNOSIS — F17210 Nicotine dependence, cigarettes, uncomplicated: Secondary | ICD-10-CM | POA: Diagnosis not present

## 2020-05-29 NOTE — ED Notes (Signed)
Spoke with group home. Pt is to be sent home via taxi.

## 2020-05-29 NOTE — ED Notes (Signed)
Pt back from CT

## 2020-05-29 NOTE — ED Triage Notes (Signed)
Pt was assisted by caregivers to floor, pt reports he had a seizure, pt not postictal. Pt A/o x4, reports he hit his head. No injuries noted.

## 2020-05-29 NOTE — ED Notes (Signed)
Warm blanket provided to pt.

## 2020-05-29 NOTE — ED Notes (Signed)
CT will come for pt in about 20 min.

## 2020-05-29 NOTE — ED Provider Notes (Signed)
[facility] Emergency Department Provider Note    Event Date/Time   First MD Initiated Contact with Patient 05/29/20 1240     (approximate)  I have reviewed the triage vital signs and the nursing notes.   HISTORY  Chief Complaint Fall    HPI Russell Buchanan is a 44 y.o. male below listed past medical history presents to the ER for evaluation of possible seizure activity with fall striking his head.  Patient well-known to this facility and appears at his baseline.  Denies any pain states he does have a mild headache.  No other complaints.   Past Medical History:  Diagnosis Date  . Antisocial personality disorder (HCC)   . Bipolar disorder (HCC)   . Environmental and seasonal allergies   . GERD (gastroesophageal reflux disease)   . Headache   . Hyperlipidemia   . Infected elbow (HCC)   . Schizophrenia (HCC)   . Seizures (HCC)   . Tremors of nervous system    Family History  Family history unknown: Yes   Past Surgical History:  Procedure Laterality Date  . CLOSED REDUCTION WRIST FRACTURE Left   . HARDWARE REMOVAL Right 04/14/2018   Procedure: HARDWARE REMOVAL-RIGHT ELBOW;  Surgeon: Deeann Saint, MD;  Location: [facility] ORS;  Service: Orthopedics;  Laterality: Right;  . ORIF ELBOW FRACTURE Right   . SCALP LACERATION REPAIR    . WRIST RECONSTRUCTION Left    Patient Active Problem List   Diagnosis Date Noted  . Seizure (HCC) 01/10/2020  . AMS (altered mental status) 06/25/2019  . Bipolar disorder (HCC)   . Antisocial personality disorder (HCC)   . Acute encephalopathy 12/04/2016  . GERD (gastroesophageal reflux disease) 12/04/2016  . HLD (hyperlipidemia) 12/04/2016  . Schizophrenia (HCC) 11/15/2016  . Medication side effects 11/15/2016      Prior to Admission medications   Medication Sig Start Date End Date Taking? Authorizing Provider  albuterol (PROVENTIL HFA;VENTOLIN HFA) 108 (90 Base) MCG/ACT inhaler Inhale 2 puffs into the lungs  every 6 (six) hours as needed for wheezing or shortness of breath. 01/30/17   Merrily Brittle, MD  benztropine (COGENTIN) 1 MG tablet Take 1 mg by mouth 3 (three) times daily.     [provider]  cetirizine (ZYRTEC) 10 MG tablet Take 10 mg by mouth daily.    [provider]  divalproex (DEPAKOTE) 500 MG DR tablet Take 1 tablet (500 mg total) by mouth every morning. 06/27/19 06/26/20  Darlin Priestly, MD  divalproex (DEPAKOTE) 500 MG DR tablet Take 2 tablets (1,000 mg total) by mouth at bedtime. 06/27/19   Darlin Priestly, MD  docusate sodium (COLACE) 100 MG capsule Take 100 mg by mouth daily.    [provider]  EPINEPHrine 0.3 mg/0.3 mL IJ SOAJ injection Inject 0.3 mg into the muscle as needed (allergic reactions).     [provider]  escitalopram (LEXAPRO) 20 MG tablet Take 20 mg by mouth daily.    [provider]  Fremanezumab-vfrm (AJOVY) 225 MG/1.5ML SOSY Inject into the skin every 28 (twenty-eight) days.    [provider]  furosemide (LASIX) 40 MG tablet Take 1 tablet (40 mg total) by mouth daily for 30 days. 06/25/18 01/10/20  Minna Antis, MD  gabapentin (NEURONTIN) 600 MG tablet Take 600 mg by mouth 3 (three) times daily.    [provider]  gemfibrozil (LOPID) 600 MG tablet Take 600 mg by mouth 2 (two) times daily before a meal.    [provider]  ketoconazole (NIZORAL) 2 % cream Apply 1 application topically 2 (two) times daily.    [provider]  Lacosamide (VIMPAT) 100 MG TABS Take 100 mg by mouth 2 (two) times daily.    [provider]  lidocaine (LIDODERM) 5 % Place 1 patch onto the skin daily. Remove & Discard patch within 12 hours or as directed by MD 04/17/20   Enid Derry, PA-C  linaclotide [facility]) 72 MCG capsule Take 72 mcg by mouth daily.     [provider]  lipase/protease/amylase (CREON) 36000 UNITS CPEP capsule Take 36,000 Units by mouth 3 (three) times daily with meals. 2 caps  withmeals and 1 with snacks    [provider]  meclizine (ANTIVERT) 25 MG tablet Take 1 tablet (25 mg total) by mouth 3 (three) times daily as needed for dizziness. 04/05/20   Delton Prairie, MD  meloxicam (MOBIC) 15 MG tablet Take 1 tablet (15 mg total) by mouth daily as needed for pain. 06/27/19   Darlin Priestly, MD  omeprazole (PRILOSEC) 40 MG capsule Take 40 mg by mouth 2 (two) times daily before a meal. 04/16/19   [provider]  ondansetron (ZOFRAN) 8 MG tablet Take 8 mg by mouth every 8 (eight) hours as needed for nausea. 06/16/19   [provider]  Paliperidone Palmitate ER (INVEGA TRINZA) 819 MG/2.625ML SUSY Inject into the muscle every 3 (three) months.    [provider]  polyethylene glycol (MIRALAX / GLYCOLAX) packet Take 17 g by mouth daily.    [provider]  potassium chloride SA (KLOR-CON) 20 MEQ tablet Take 20 mEq by mouth daily.    [provider]  pravastatin (PRAVACHOL) 40 MG tablet Take 40 mg by mouth every evening. 06/24/19   [provider]  propranolol ER (INDERAL LA) 160 MG SR capsule Take 160 mg by mouth daily.    [provider]  QUEtiapine (SEROQUEL) 100 MG tablet Take 100 mg by mouth at bedtime.  06/24/19   [provider]  simethicone (MYLICON) 80 MG chewable tablet Chew 80 mg by mouth 2 (two) times daily.    [provider]  SUMAtriptan (IMITREX) 100 MG tablet Take 100 mg by mouth every 2 (two) hours as needed for migraine. May repeat in 2 hours if headache persists or recurs.    [provider]  Tiotropium Bromide-Olodaterol (STIOLTO RESPIMAT IN) Inhale 2 puffs into the lungs daily.    [provider]    Allergies Bee venom    Social History Social History   Tobacco Use  . Smoking status: Current Every Day Smoker    Packs/day: 0.50    Years: 7.00    Pack years: 3.50    Types: Cigarettes  . Smokeless tobacco: Former Neurosurgeon    Types: Chew, Snuff  Vaping Use   . Vaping Use: Never used  Substance Use Topics  . Alcohol use: No  . Drug use: Not Currently    Review of Systems Patient denies headaches, rhinorrhea, blurry vision, numbness, shortness of breath, chest pain, edema, cough, abdominal pain, nausea, vomiting, diarrhea, dysuria, fevers, rashes or hallucinations unless otherwise stated above in HPI. ____________________________________________   PHYSICAL EXAM:  VITAL SIGNS: Vitals:   05/29/20 1244 05/29/20 1300  BP: (!) 110/55 105/75  Pulse: 60 66  Resp: 18 13  Temp: 97.8 F (36.6 C)   SpO2: 98% 98%    Constitutional: Alert, appears to be at his baseline  Eyes: Conjunctivae are normal.  Head: Atraumatic. Nose: No  congestion/rhinnorhea. Mouth/Throat: Mucous membranes are moist.   Neck: No stridor. Painless ROM.  Cardiovascular: Normal rate, regular rhythm. Grossly normal heart sounds.  Good peripheral circulation. Respiratory: Normal respiratory effort.  No retractions. Lungs CTAB. Gastrointestinal: Soft and nontender. No distention. No abdominal bruits. No CVA tenderness. Genitourinary: deferred Musculoskeletal: No lower extremity tenderness nor edema.  No joint effusions. Neurologic:  No acute focal neurologic deficits are appreciated. No facial droop. At baseline Skin:  Skin is warm, dry and intact. No rash noted. Psychiatric: calm and cooperative  ____________________________________________   LABS (all labs ordered are listed, but only abnormal results are displayed)  No results found for this or any previous visit (from the past 24 hour(s)). ____________________________________________ ____________________________________________  RADIOLOGY  I personally reviewed all radiographic images ordered to evaluate for the above acute complaints and reviewed radiology reports and findings.  These findings were personally discussed with the patient.  Please see medical record for radiology  report.  ____________________________________________   PROCEDURES  Procedure(s) performed:  Procedures    Critical Care performed: no ____________________________________________   INITIAL IMPRESSION / ASSESSMENT AND PLAN / ED COURSE  Pertinent labs & imaging results that were available during my care of the patient were reviewed by me and considered in my medical decision making (see chart for details).   DDX: Seizure disorder, medication effect, dehydration, IPH, concussion  Russell Buchanan is a 44 y.o. who presents to the ED with presentation as described above.  Patient appears at his baseline.  CT imaging ordered as he reportedly did hit his head during the seizure.  Not consistent with status.  Do not feel the blood work clinically indicated.  CBG normal with EMS.  VSS.    ----------------------------------------- 2:04 PM on 05/29/2020 -----------------------------------------  CT imaging reassuring.  Patient observed in the ER remains at his baseline nontoxic-appearing, stable appropriate for outpatient follow-up  The patient was evaluated in Emergency Department today for the symptoms described in the history of present illness. He/she was evaluated in the context of the global COVID-19 pandemic, which necessitated consideration that the patient might be at risk for infection with the SARS-CoV-2 virus that causes COVID-19. Institutional protocols and algorithms that pertain to the evaluation of patients at risk for COVID-19 are in a state of rapid change based on information released by regulatory bodies including the CDC and federal and state organizations. These policies and algorithms were followed during the patient's care in the ED.  As part of my medical decision making, I reviewed the following data within the electronic MEDICAL RECORD NUMBER Nursing notes reviewed and incorporated, Labs reviewed, notes from prior ED visits and [place] Controlled Substance  Database   ____________________________________________   FINAL CLINICAL IMPRESSION(S) / ED DIAGNOSES  Final diagnoses:  Seizure-like activity (HCC)      NEW MEDICATIONS STARTED DURING THIS VISIT:  New Prescriptions   No medications on file     Note:  This document was prepared using Dragon voice recognition software and may include unintentional dictation errors.    Willy Eddy, MD 05/29/20 [phone]

## 2020-05-29 NOTE — ED Notes (Signed)
Pt to CT

## 2020-05-29 NOTE — ED Notes (Signed)
Called [employer] [facility]. Gave address and home phone of group home. They will come for pt. Preparing pt for discharge.

## 2020-05-29 NOTE — ED Notes (Addendum)
Pt resting in bed. Pt asking to go home. Explained that EDP needs to clear pt for discharge.

## 2020-05-29 NOTE — ED Notes (Addendum)
Tried to call group home to discuss transport for discharge. Left voicemail on all mobile numbers.

## 2020-05-29 NOTE — ED Notes (Signed)
Pt to ED via [facility] from [facility], staff there said pt fell and had possible seizure but no postictal phase noted. Pt unsure if hit head. EDP has seen pt. Pt is A&O times 4. No deficits noted.

## 2020-06-08 ENCOUNTER — Other Ambulatory Visit: Payer: Self-pay

## 2020-06-08 ENCOUNTER — Emergency Department: Payer: Medicaid Other

## 2020-06-08 ENCOUNTER — Emergency Department
Admission: EM | Admit: 2020-06-08 | Discharge: 2020-06-08 | Disposition: A | Discharge status: Home / Self Care | Payer: Medicaid Other | Attending: Emergency Medicine | Admitting: Emergency Medicine

## 2020-06-08 DIAGNOSIS — R569 Unspecified convulsions: Secondary | ICD-10-CM | POA: Insufficient documentation

## 2020-06-08 DIAGNOSIS — R4182 Altered mental status, unspecified: Secondary | ICD-10-CM | POA: Diagnosis not present

## 2020-06-08 DIAGNOSIS — F17210 Nicotine dependence, cigarettes, uncomplicated: Secondary | ICD-10-CM | POA: Insufficient documentation

## 2020-06-08 LAB — CBC WITH DIFFERENTIAL/PLATELET
Abs Immature Granulocytes: 0.02 10*3/uL (ref 0.00–0.07)
Basophils Absolute: 0 10*3/uL (ref 0.0–0.1)
Basophils Relative: 0 %
Eosinophils Absolute: 0.1 10*3/uL (ref 0.0–0.5)
Eosinophils Relative: 1 %
HCT: 43.7 % (ref 39.0–52.0)
Hemoglobin: 14.7 g/dL (ref 13.0–17.0)
Immature Granulocytes: 0 %
Lymphocytes Relative: 30 %
Lymphs Abs: 1.9 10*3/uL (ref 0.7–4.0)
MCH: 31 pg (ref 26.0–34.0)
MCHC: 33.6 g/dL (ref 30.0–36.0)
MCV: 92.2 fL (ref 80.0–100.0)
Monocytes Absolute: 0.9 10*3/uL (ref 0.1–1.0)
Monocytes Relative: 13 %
Neutro Abs: 3.6 10*3/uL (ref 1.7–7.7)
Neutrophils Relative %: 56 %
Platelets: 144 10*3/uL — ABNORMAL LOW (ref 150–400)
RBC: 4.74 MIL/uL (ref 4.22–5.81)
RDW: 14.4 % (ref 11.5–15.5)
WBC: 6.5 10*3/uL (ref 4.0–10.5)
nRBC: 0 % (ref 0.0–0.2)

## 2020-06-08 LAB — COMPREHENSIVE METABOLIC PANEL
ALT: 22 U/L (ref 0–44)
AST: 42 U/L — ABNORMAL HIGH (ref 15–41)
Albumin: 3.2 g/dL — ABNORMAL LOW (ref 3.5–5.0)
Alkaline Phosphatase: 113 U/L (ref 38–126)
Anion gap: 14 (ref 5–15)
BUN: 14 mg/dL (ref 6–20)
CO2: 23 mmol/L (ref 22–32)
Calcium: 8.7 mg/dL — ABNORMAL LOW (ref 8.9–10.3)
Chloride: 102 mmol/L (ref 98–111)
Creatinine, Ser: 0.81 mg/dL (ref 0.61–1.24)
GFR, Estimated: >60 mL/min (ref >60)
Glucose, Bld: 66 mg/dL — ABNORMAL LOW (ref 70–99)
Potassium: 5.2 mmol/L — ABNORMAL HIGH (ref 3.5–5.1)
Sodium: 139 mmol/L (ref 135–145)
Total Bilirubin: 1.4 mg/dL — ABNORMAL HIGH (ref 0.3–1.2)
Total Protein: 6.2 g/dL — ABNORMAL LOW (ref 6.5–8.1)

## 2020-06-08 LAB — ETHANOL: Alcohol, Ethyl (B): <10 mg/dL (ref <10)

## 2020-06-08 LAB — VALPROIC ACID LEVEL: Valproic Acid Lvl: 79 ug/mL (ref 50.0–100.0)

## 2020-06-08 MED ORDER — DEXTROSE 50 % IV SOLN
25.0000 mL | Freq: Once | INTRAVENOUS | Status: AC
  Administered 2020-06-08: 25 mL via INTRAVENOUS
  Filled 2020-06-08: qty 50

## 2020-06-08 MED ORDER — LACOSAMIDE 50 MG PO TABS
100.0000 mg | ORAL_TABLET | Freq: Once | ORAL | Status: AC
  Administered 2020-06-08: 100 mg via ORAL
  Filled 2020-06-08: qty 2

## 2020-06-08 MED ORDER — SODIUM CHLORIDE 0.9 % IV BOLUS
1000.0000 mL | Freq: Once | INTRAVENOUS | Status: AC
  Administered 2020-06-08: 1000 mL via INTRAVENOUS

## 2020-06-08 MED ORDER — LORAZEPAM 2 MG/ML IJ SOLN
1.0000 mg | Freq: Once | INTRAMUSCULAR | Status: AC
  Administered 2020-06-08: 1 mg via INTRAVENOUS
  Filled 2020-06-08: qty 1

## 2020-06-08 MED ORDER — DIVALPROEX SODIUM 500 MG PO DR TAB
500.0000 mg | DELAYED_RELEASE_TABLET | Freq: Once | ORAL | Status: AC
  Administered 2020-06-08: 500 mg via ORAL
  Filled 2020-06-08: qty 1

## 2020-06-08 NOTE — ED Notes (Signed)
Pt moved to 12H while awaiting EMS D/C p/up per MD request.

## 2020-06-08 NOTE — ED Notes (Signed)
Pt refuses EKG and cardiac monitoring, MD aware and pt refuses to keep arm straight so pt can get IVF in. Pt refuses new IV placement to help with issues of IVF using while PIV in [facility].

## 2020-06-08 NOTE — ED Notes (Signed)
Report to [place] at [facility]

## 2020-06-08 NOTE — Discharge Instructions (Signed)
Mr. Campos was given his Vimpat and Depakote doses in the ED.  His lab work was very reassuring.  I would recommend making sure that he takes his other medications as prescribed.  He was also noted to have a low blood sugar and should be encouraged to eat more frequently.

## 2020-06-08 NOTE — ED Notes (Signed)
EMS here to transport pt, pt refuses D/C vitals.

## 2020-06-08 NOTE — ED Provider Notes (Signed)
[facility] Emergency Department Provider Note  ____________________________________________   Event Date/Time   First MD Initiated Contact with Patient 06/08/20 [phone]     (approximate)  I have reviewed the triage vital signs and the nursing notes.   HISTORY  Chief Complaint Seizures    HPI Russell Buchanan is a 45 y.o. male with history of schizoaffective disorder, antisocial personality disorder, seizures, here with reported seizure-like activity.  History is somewhat limited as patient is mildly confused on arrival.  Per report from EMS, the patient had a witnessed, seizure-like activity lasting less than 30 minutes this morning.  The patient reportedly is back to his baseline per EMS, who has him well.  He has been taking his medications with the exception of this morning.  On my assessment, the patient denies complaints though he is mildly confused.  He says he has no pain.  Denies any headache.  Remainder of history limited due to mild confusion.     Level 5 caveat invoked as remainder of history, ROS, and physical exam limited due to patient's confusion/mental status.   Past Medical History:  Diagnosis Date  . Antisocial personality disorder (HCC)   . Bipolar disorder (HCC)   . Environmental and seasonal allergies   . GERD (gastroesophageal reflux disease)   . Headache   . Hyperlipidemia   . Infected elbow (HCC)   . Schizophrenia (HCC)   . Seizures (HCC)   . Tremors of nervous system     Patient Active Problem List   Diagnosis Date Noted  . Seizure (HCC) 01/10/2020  . AMS (altered mental status) 06/25/2019  . Bipolar disorder (HCC)   . Antisocial personality disorder (HCC)   . Acute encephalopathy 12/04/2016  . GERD (gastroesophageal reflux disease) 12/04/2016  . HLD (hyperlipidemia) 12/04/2016  . Schizophrenia (HCC) 11/15/2016  . Medication side effects 11/15/2016    Past Surgical History:  Procedure Laterality Date  . CLOSED  REDUCTION WRIST FRACTURE Left   . HARDWARE REMOVAL Right 04/14/2018   Procedure: HARDWARE REMOVAL-RIGHT ELBOW;  Surgeon: Deeann Saint, MD;  Location: [facility] ORS;  Service: Orthopedics;  Laterality: Right;  . ORIF ELBOW FRACTURE Right   . SCALP LACERATION REPAIR    . WRIST RECONSTRUCTION Left     Prior to Admission medications   Medication Sig Start Date End Date Taking? Authorizing Provider  albuterol (PROVENTIL HFA;VENTOLIN HFA) 108 (90 Base) MCG/ACT inhaler Inhale 2 puffs into the lungs every 6 (six) hours as needed for wheezing or shortness of breath. 01/30/17   Merrily Brittle, MD  benztropine (COGENTIN) 1 MG tablet Take 1 mg by mouth 3 (three) times daily.     [provider]  cetirizine (ZYRTEC) 10 MG tablet Take 10 mg by mouth daily.    [provider]  divalproex (DEPAKOTE) 500 MG DR tablet Take 1 tablet (500 mg total) by mouth every morning. 06/27/19 06/26/20  Darlin Priestly, MD  divalproex (DEPAKOTE) 500 MG DR tablet Take 2 tablets (1,000 mg total) by mouth at bedtime. 06/27/19   Darlin Priestly, MD  docusate sodium (COLACE) 100 MG capsule Take 100 mg by mouth daily.    [provider]  EPINEPHrine 0.3 mg/0.3 mL IJ SOAJ injection Inject 0.3 mg into the muscle as needed (allergic reactions).     [provider]  escitalopram (LEXAPRO) 20 MG tablet Take 20 mg by mouth daily.    [provider]  Fremanezumab-vfrm (AJOVY) 225 MG/1.5ML SOSY Inject into the skin every 28 (twenty-eight) days.  [provider]  furosemide (LASIX) 40 MG tablet Take 1 tablet (40 mg total) by mouth daily for 30 days. 06/25/18 01/10/20  Harvest Dark, MD  gabapentin (NEURONTIN) 600 MG tablet Take 600 mg by mouth 3 (three) times daily.    [provider]  gemfibrozil (LOPID) 600 MG tablet Take 600 mg by mouth 2 (two) times daily before a meal.    [provider]  ketoconazole (NIZORAL) 2 % cream Apply 1 application topically 2 (two) times daily.     [provider]  Lacosamide (VIMPAT) 100 MG TABS Take 100 mg by mouth 2 (two) times daily.    [provider]  lidocaine (LIDODERM) 5 % Place 1 patch onto the skin daily. Remove & Discard patch within 12 hours or as directed by MD 04/17/20   Laban Emperor, PA-C  linaclotide [facility]) 72 MCG capsule Take 72 mcg by mouth daily.     [provider]  lipase/protease/amylase (CREON) 36000 UNITS CPEP capsule Take 36,000 Units by mouth 3 (three) times daily with meals. 2 caps withmeals and 1 with snacks    [provider]  meclizine (ANTIVERT) 25 MG tablet Take 1 tablet (25 mg total) by mouth 3 (three) times daily as needed for dizziness. 04/05/20   Vladimir Crofts, MD  meloxicam (MOBIC) 15 MG tablet Take 1 tablet (15 mg total) by mouth daily as needed for pain. 06/27/19   Enzo Bi, MD  omeprazole (PRILOSEC) 40 MG capsule Take 40 mg by mouth 2 (two) times daily before a meal. 04/16/19   [provider]  ondansetron (ZOFRAN) 8 MG tablet Take 8 mg by mouth every 8 (eight) hours as needed for nausea. 06/16/19   [provider]  Paliperidone Palmitate ER (INVEGA TRINZA) 819 MG/2.625ML SUSY Inject into the muscle every 3 (three) months.    [provider]  polyethylene glycol (MIRALAX / GLYCOLAX) packet Take 17 g by mouth daily.    [provider]  potassium chloride SA (KLOR-CON) 20 MEQ tablet Take 20 mEq by mouth daily.    [provider]  pravastatin (PRAVACHOL) 40 MG tablet Take 40 mg by mouth every evening. 06/24/19   [provider]  propranolol ER (INDERAL LA) 160 MG SR capsule Take 160 mg by mouth daily.    [provider]  QUEtiapine (SEROQUEL) 100 MG tablet Take 100 mg by mouth at bedtime.  06/24/19   [provider]  simethicone (MYLICON) 80 MG chewable tablet Chew 80 mg by mouth 2 (two) times daily.    [provider]  SUMAtriptan (IMITREX) 100 MG tablet Take 100 mg by mouth every 2 (two)  hours as needed for migraine. May repeat in 2 hours if headache persists or recurs.    [provider]  Tiotropium Bromide-Olodaterol (STIOLTO RESPIMAT IN) Inhale 2 puffs into the lungs daily.    [provider]    Allergies Bee venom  Family History  Family history unknown: Yes    Social History Social History   Tobacco Use  . Smoking status: Current Every Day Smoker    Packs/day: 0.50    Years: 7.00    Pack years: 3.50    Types: Cigarettes  . Smokeless tobacco: Former Systems developer    Types: Chew, Snuff  Vaping Use  . Vaping Use: Never used  Substance Use Topics  . Alcohol use: No  . Drug use: Not Currently    Review of Systems  Review of Systems  Unable to perform ROS:  Mental status change     ____________________________________________  PHYSICAL EXAM:      VITAL SIGNS: ED Triage Vitals  Enc Vitals Group     BP 06/08/20 0942 131/82     Pulse Rate 06/08/20 0942 64     Resp 06/08/20 0942 17     Temp 06/08/20 0942 98 F (36.7 C)     Temp Source 06/08/20 0942 Oral     SpO2 06/08/20 0942 97 %     Weight 06/08/20 0943 190 lb (86.2 kg)     Height --      Head Circumference --      Peak Flow --      Pain Score 06/08/20 0943 0     Pain Loc --      Pain Edu? --      Excl. in GC? --      Physical Exam Vitals and nursing note reviewed.  Constitutional:      General: He is not in acute distress.    Appearance: He is well-developed and well-nourished.  HENT:     Head: Normocephalic and atraumatic.     Mouth/Throat:     Mouth: Mucous membranes are dry.  Eyes:     Conjunctiva/sclera: Conjunctivae normal.  Cardiovascular:     Rate and Rhythm: Normal rate and regular rhythm.     Heart sounds: Normal heart sounds.  Pulmonary:     Effort: Pulmonary effort is normal. No respiratory distress.     Breath sounds: No wheezing.  Abdominal:     General: There is no distension.  Musculoskeletal:        General: No edema.     Cervical back: Neck supple.   Skin:    General: Skin is warm.     Capillary Refill: Capillary refill takes less than 2 seconds.     Findings: No rash.  Neurological:     Mental Status: He is alert. He is disoriented.     Motor: No abnormal muscle tone.     Comments: Speech slightly slurred. Oriented to person, knows he is in hospital but does not know time. MAE with at least antigravity strength. No tremors or seizure like activity noted.       ____________________________________________   LABS (all labs ordered are listed, but only abnormal results are displayed)  Labs Reviewed  CBC WITH DIFFERENTIAL/PLATELET - Abnormal; Notable for the following components:      Result Value   Platelets 144 (*)    All other components within normal limits  COMPREHENSIVE METABOLIC PANEL - Abnormal; Notable for the following components:   Potassium 5.2 (*)    Glucose, Bld 66 (*)    Calcium 8.7 (*)    Total Protein 6.2 (*)    Albumin 3.2 (*)    AST 42 (*)    Total Bilirubin 1.4 (*)    All other components within normal limits  VALPROIC ACID LEVEL  ETHANOL    ____________________________________________  EKG:  ________________________________________  RADIOLOGY All imaging, including plain films, CT scans, and ultrasounds, independently reviewed by me, and interpretations confirmed via formal radiology reads.  ED MD interpretation:   CT Head: NAICA  Official radiology report(s): CT Head Wo Contrast  Result Date: 06/08/2020 CLINICAL DATA:  45 year old male with history of head trauma. Seizure-like activity. EXAM: CT HEAD WITHOUT CONTRAST TECHNIQUE: Contiguous axial images were obtained from the base of the skull through the vertex without intravenous contrast. COMPARISON:  Head CT 05/29/2020. FINDINGS: Brain: No evidence of acute  infarction, hemorrhage, hydrocephalus, extra-axial collection or mass lesion/mass effect. Vascular: No hyperdense vessel or unexpected calcification. Skull: Normal. Negative for fracture  or focal lesion. Sinuses/Orbits: No acute finding. Other: None. IMPRESSION: 1. No evidence of acute traumatic injury to the skull or brain. The appearance of the brain is normal. Electronically Signed   By: Trudie Reed M.D.   On: 06/08/2020 10:19    ____________________________________________  PROCEDURES   Procedure(s) performed (including Critical Care):  Procedures  ____________________________________________  INITIAL IMPRESSION / MDM / ASSESSMENT AND PLAN / ED COURSE  As part of my medical decision making, I reviewed the following data within the electronic MEDICAL RECORD NUMBER Nursing notes reviewed and incorporated, Old chart reviewed, Notes from prior ED visits, and [place] Controlled Substance Database       *Russell Buchanan was evaluated in Emergency Department on 06/08/2020 for the symptoms described in the history of present illness. He was evaluated in the context of the global COVID-19 pandemic, which necessitated consideration that the patient might be at risk for infection with the SARS-CoV-2 virus that causes COVID-19. Institutional protocols and algorithms that pertain to the evaluation of patients at risk for COVID-19 are in a state of rapid change based on information released by regulatory bodies including the CDC and federal and state organizations. These policies and algorithms were followed during the patient's care in the ED.  Some ED evaluations and interventions may be delayed as a result of limited staffing during the pandemic.*     Medical Decision Making:  45 yo M here with reported seizure like activity. This is a well known, recurrent issue. Unclear if he had true seizure versus behavioral issue. He is afebrile, non toxic and at his mental baseline in the ED. CT head reviewed and is negative. No focal neuro deficits. Lab work reassuring is reassuring with normal WBC, normal renal function. Mild hypoglycemia noted - able to tolerate PO. No apparent infectious  trigger. Depakote is therapeutic.  Pt given his PO meds, observed without any seizure like activity. He is back to his mental baseline. Will discharge back to facility.  ____________________________________________  FINAL CLINICAL IMPRESSION(S) / ED DIAGNOSES  Final diagnoses:  Seizure-like activity (HCC)     MEDICATIONS GIVEN DURING THIS VISIT:  Medications  sodium chloride 0.9 % bolus 1,000 mL (0 mLs Intravenous Stopped 06/08/20 1441)  LORazepam (ATIVAN) injection 1 mg (1 mg Intravenous Given 06/08/20 1024)  sodium chloride 0.9 % bolus 1,000 mL (0 mLs Intravenous Stopped 06/08/20 1417)  lacosamide (VIMPAT) tablet 100 mg (100 mg Oral Given 06/08/20 1118)  divalproex (DEPAKOTE) DR tablet 500 mg (500 mg Oral Given 06/08/20 1118)  dextrose 50 % solution 25 mL (25 mLs Intravenous Given 06/08/20 1125)     ED Discharge Orders    None       Note:  This document was prepared using Dragon voice recognition software and may include unintentional dictation errors.   Shaune Pollack, MD 06/08/20 1944

## 2020-06-08 NOTE — ED Notes (Signed)
Pt presents to ED via EMS with c/o of having seizure like activity. EMS states facility called due to pt have a "shaking episode for 30 minutes" but deny pt having a post ictal period after seizure like acvitivty. EMS states familiar with pt and pt appears to be "normal self". Pt denies any complaints at this time. Pt states he is complaint with meds and EMS states they were also given this information from facility. Pt states he fell over or on top of a dresser but is unsure at this time. Pt is verbally hard to understand. Pt denies pain at this time. Pt is orientated to himself but not place at this time. Pt appears to have severely dry lips and mucous membranes inside mouth. VSS.

## 2020-06-08 NOTE — ED Notes (Signed)
[facility]  CALLED  FOR  TRANSPORT  TO  [facility]

## 2020-09-26 ENCOUNTER — Other Ambulatory Visit: Payer: Self-pay | Admitting: Gastroenterology

## 2020-09-26 DIAGNOSIS — R1084 Generalized abdominal pain: Secondary | ICD-10-CM

## 2020-09-26 DIAGNOSIS — R1319 Other dysphagia: Secondary | ICD-10-CM

## 2020-09-26 DIAGNOSIS — K219 Gastro-esophageal reflux disease without esophagitis: Secondary | ICD-10-CM

## 2020-09-26 DIAGNOSIS — R634 Abnormal weight loss: Secondary | ICD-10-CM

## 2020-09-26 DIAGNOSIS — R1310 Dysphagia, unspecified: Secondary | ICD-10-CM

## 2020-09-26 DIAGNOSIS — R110 Nausea: Secondary | ICD-10-CM

## 2020-10-06 ENCOUNTER — Ambulatory Visit: Admission: RE | Admit: 2020-10-06 | Payer: Medicaid Other | Source: Ambulatory Visit

## 2020-10-12 ENCOUNTER — Other Ambulatory Visit: Payer: Medicaid Other

## 2020-10-12 ENCOUNTER — Ambulatory Visit
Admission: RE | Admit: 2020-10-12 | Discharge: 2020-10-12 | Disposition: A | Discharge status: Home / Self Care | Payer: Medicaid Other | Source: Ambulatory Visit | Attending: Gastroenterology | Admitting: Gastroenterology

## 2020-10-12 ENCOUNTER — Other Ambulatory Visit: Payer: Self-pay

## 2020-10-12 DIAGNOSIS — R1084 Generalized abdominal pain: Secondary | ICD-10-CM | POA: Diagnosis present

## 2020-10-12 DIAGNOSIS — K449 Diaphragmatic hernia without obstruction or gangrene: Secondary | ICD-10-CM | POA: Diagnosis present

## 2020-10-12 DIAGNOSIS — K219 Gastro-esophageal reflux disease without esophagitis: Secondary | ICD-10-CM

## 2020-10-12 DIAGNOSIS — R1319 Other dysphagia: Secondary | ICD-10-CM | POA: Diagnosis present

## 2020-10-12 DIAGNOSIS — R634 Abnormal weight loss: Secondary | ICD-10-CM

## 2020-10-12 DIAGNOSIS — R1310 Dysphagia, unspecified: Secondary | ICD-10-CM

## 2020-10-12 DIAGNOSIS — R110 Nausea: Secondary | ICD-10-CM | POA: Diagnosis present

## 2020-10-19 ENCOUNTER — Other Ambulatory Visit: Payer: Self-pay | Admitting: Gastroenterology

## 2020-10-19 DIAGNOSIS — R1310 Dysphagia, unspecified: Secondary | ICD-10-CM

## 2020-10-19 DIAGNOSIS — R634 Abnormal weight loss: Secondary | ICD-10-CM

## 2020-10-19 DIAGNOSIS — R110 Nausea: Secondary | ICD-10-CM

## 2020-10-19 DIAGNOSIS — R1084 Generalized abdominal pain: Secondary | ICD-10-CM

## 2020-10-19 DIAGNOSIS — R1319 Other dysphagia: Secondary | ICD-10-CM

## 2020-10-28 ENCOUNTER — Other Ambulatory Visit: Payer: Self-pay

## 2020-10-28 ENCOUNTER — Ambulatory Visit
Admission: RE | Admit: 2020-10-28 | Discharge: 2020-10-28 | Disposition: A | Discharge status: Home / Self Care | Payer: Medicaid Other | Source: Ambulatory Visit | Attending: Gastroenterology | Admitting: Gastroenterology

## 2020-10-28 DIAGNOSIS — R1319 Other dysphagia: Secondary | ICD-10-CM | POA: Insufficient documentation

## 2020-10-28 DIAGNOSIS — R634 Abnormal weight loss: Secondary | ICD-10-CM | POA: Diagnosis present

## 2020-10-28 DIAGNOSIS — R1310 Dysphagia, unspecified: Secondary | ICD-10-CM | POA: Diagnosis present

## 2020-10-28 DIAGNOSIS — R110 Nausea: Secondary | ICD-10-CM | POA: Diagnosis present

## 2020-10-28 DIAGNOSIS — R1084 Generalized abdominal pain: Secondary | ICD-10-CM | POA: Diagnosis present

## 2020-11-26 ENCOUNTER — Emergency Department: Payer: Medicaid Other

## 2020-11-26 ENCOUNTER — Inpatient Hospital Stay (HOSPITAL_COMMUNITY)
Admission: AD | Admit: 2020-11-26 | Discharge: 2020-12-01 | DRG: 871 | Disposition: A | Discharge status: Home Health | Payer: Medicaid Other | Source: Other Acute Inpatient Hospital | Attending: Pulmonary Disease | Admitting: Pulmonary Disease

## 2020-11-26 ENCOUNTER — Other Ambulatory Visit: Payer: Self-pay

## 2020-11-26 ENCOUNTER — Emergency Department
Admission: EM | Admit: 2020-11-26 | Discharge: 2020-11-26 | Disposition: A | Discharge status: Home / Self Care | Payer: Medicaid Other | Attending: Emergency Medicine | Admitting: Emergency Medicine

## 2020-11-26 DIAGNOSIS — L538 Other specified erythematous conditions: Secondary | ICD-10-CM | POA: Diagnosis not present

## 2020-11-26 DIAGNOSIS — Z91030 Bee allergy status: Secondary | ICD-10-CM | POA: Diagnosis not present

## 2020-11-26 DIAGNOSIS — F602 Antisocial personality disorder: Secondary | ICD-10-CM | POA: Diagnosis present

## 2020-11-26 DIAGNOSIS — G9340 Encephalopathy, unspecified: Secondary | ICD-10-CM | POA: Diagnosis present

## 2020-11-26 DIAGNOSIS — F17210 Nicotine dependence, cigarettes, uncomplicated: Secondary | ICD-10-CM | POA: Insufficient documentation

## 2020-11-26 DIAGNOSIS — G43909 Migraine, unspecified, not intractable, without status migrainosus: Secondary | ICD-10-CM | POA: Diagnosis present

## 2020-11-26 DIAGNOSIS — L03116 Cellulitis of left lower limb: Secondary | ICD-10-CM | POA: Insufficient documentation

## 2020-11-26 DIAGNOSIS — Y9301 Activity, walking, marching and hiking: Secondary | ICD-10-CM | POA: Insufficient documentation

## 2020-11-26 DIAGNOSIS — R579 Shock, unspecified: Secondary | ICD-10-CM

## 2020-11-26 DIAGNOSIS — F209 Schizophrenia, unspecified: Secondary | ICD-10-CM | POA: Diagnosis present

## 2020-11-26 DIAGNOSIS — S42032A Displaced fracture of lateral end of left clavicle, initial encounter for closed fracture: Secondary | ICD-10-CM | POA: Diagnosis present

## 2020-11-26 DIAGNOSIS — Y92009 Unspecified place in unspecified non-institutional (private) residence as the place of occurrence of the external cause: Secondary | ICD-10-CM | POA: Insufficient documentation

## 2020-11-26 DIAGNOSIS — G40901 Epilepsy, unspecified, not intractable, with status epilepticus: Secondary | ICD-10-CM | POA: Diagnosis present

## 2020-11-26 DIAGNOSIS — G9341 Metabolic encephalopathy: Secondary | ICD-10-CM | POA: Diagnosis present

## 2020-11-26 DIAGNOSIS — A419 Sepsis, unspecified organism: Principal | ICD-10-CM | POA: Diagnosis present

## 2020-11-26 DIAGNOSIS — Z20822 Contact with and (suspected) exposure to covid-19: Secondary | ICD-10-CM | POA: Diagnosis present

## 2020-11-26 DIAGNOSIS — J9601 Acute respiratory failure with hypoxia: Secondary | ICD-10-CM | POA: Diagnosis present

## 2020-11-26 DIAGNOSIS — J302 Other seasonal allergic rhinitis: Secondary | ICD-10-CM | POA: Diagnosis present

## 2020-11-26 DIAGNOSIS — N179 Acute kidney failure, unspecified: Secondary | ICD-10-CM | POA: Diagnosis present

## 2020-11-26 DIAGNOSIS — D649 Anemia, unspecified: Secondary | ICD-10-CM | POA: Diagnosis present

## 2020-11-26 DIAGNOSIS — R569 Unspecified convulsions: Secondary | ICD-10-CM

## 2020-11-26 DIAGNOSIS — S42002D Fracture of unspecified part of left clavicle, subsequent encounter for fracture with routine healing: Secondary | ICD-10-CM | POA: Diagnosis not present

## 2020-11-26 DIAGNOSIS — R4182 Altered mental status, unspecified: Secondary | ICD-10-CM | POA: Insufficient documentation

## 2020-11-26 DIAGNOSIS — R6521 Severe sepsis with septic shock: Secondary | ICD-10-CM | POA: Diagnosis present

## 2020-11-26 DIAGNOSIS — Z79899 Other long term (current) drug therapy: Secondary | ICD-10-CM

## 2020-11-26 DIAGNOSIS — S42002A Fracture of unspecified part of left clavicle, initial encounter for closed fracture: Secondary | ICD-10-CM | POA: Insufficient documentation

## 2020-11-26 DIAGNOSIS — W010XXA Fall on same level from slipping, tripping and stumbling without subsequent striking against object, initial encounter: Secondary | ICD-10-CM | POA: Insufficient documentation

## 2020-11-26 DIAGNOSIS — W1830XA Fall on same level, unspecified, initial encounter: Secondary | ICD-10-CM | POA: Diagnosis present

## 2020-11-26 DIAGNOSIS — K219 Gastro-esophageal reflux disease without esophagitis: Secondary | ICD-10-CM | POA: Diagnosis present

## 2020-11-26 DIAGNOSIS — L0390 Cellulitis, unspecified: Secondary | ICD-10-CM | POA: Diagnosis present

## 2020-11-26 DIAGNOSIS — F319 Bipolar disorder, unspecified: Secondary | ICD-10-CM | POA: Diagnosis present

## 2020-11-26 DIAGNOSIS — S42009A Fracture of unspecified part of unspecified clavicle, initial encounter for closed fracture: Secondary | ICD-10-CM

## 2020-11-26 DIAGNOSIS — F203 Undifferentiated schizophrenia: Secondary | ICD-10-CM | POA: Diagnosis not present

## 2020-11-26 DIAGNOSIS — S4992XA Unspecified injury of left shoulder and upper arm, initial encounter: Secondary | ICD-10-CM | POA: Diagnosis present

## 2020-11-26 DIAGNOSIS — D696 Thrombocytopenia, unspecified: Secondary | ICD-10-CM | POA: Diagnosis not present

## 2020-11-26 DIAGNOSIS — L03818 Cellulitis of other sites: Secondary | ICD-10-CM | POA: Diagnosis not present

## 2020-11-26 DIAGNOSIS — I428 Other cardiomyopathies: Secondary | ICD-10-CM | POA: Diagnosis not present

## 2020-11-26 DIAGNOSIS — E876 Hypokalemia: Secondary | ICD-10-CM | POA: Diagnosis not present

## 2020-11-26 DIAGNOSIS — E785 Hyperlipidemia, unspecified: Secondary | ICD-10-CM | POA: Diagnosis present

## 2020-11-26 DIAGNOSIS — R609 Edema, unspecified: Secondary | ICD-10-CM | POA: Diagnosis not present

## 2020-11-26 LAB — CBC WITH DIFFERENTIAL/PLATELET
Abs Immature Granulocytes: 0.02 10*3/uL (ref 0.00–0.07)
Basophils Absolute: 0 10*3/uL (ref 0.0–0.1)
Basophils Relative: 1 %
Eosinophils Absolute: 0 10*3/uL (ref 0.0–0.5)
Eosinophils Relative: 0 %
HCT: 33.5 % — ABNORMAL LOW (ref 39.0–52.0)
Hemoglobin: 11.7 g/dL — ABNORMAL LOW (ref 13.0–17.0)
Immature Granulocytes: 0 %
Lymphocytes Relative: 43 %
Lymphs Abs: 2.1 10*3/uL (ref 0.7–4.0)
MCH: 32 pg (ref 26.0–34.0)
MCHC: 34.9 g/dL (ref 30.0–36.0)
MCV: 91.5 fL (ref 80.0–100.0)
Monocytes Absolute: 0.7 10*3/uL (ref 0.1–1.0)
Monocytes Relative: 13 %
Neutro Abs: 2.1 10*3/uL (ref 1.7–7.7)
Neutrophils Relative %: 43 %
Platelets: 131 10*3/uL — ABNORMAL LOW (ref 150–400)
RBC: 3.66 MIL/uL — ABNORMAL LOW (ref 4.22–5.81)
RDW: 13.7 % (ref 11.5–15.5)
WBC: 5 10*3/uL (ref 4.0–10.5)
nRBC: 0 % (ref 0.0–0.2)

## 2020-11-26 LAB — COMPREHENSIVE METABOLIC PANEL
ALT: 22 U/L (ref 0–44)
AST: 51 U/L — ABNORMAL HIGH (ref 15–41)
Albumin: 3.2 g/dL — ABNORMAL LOW (ref 3.5–5.0)
Alkaline Phosphatase: 92 U/L (ref 38–126)
Anion gap: 12 (ref 5–15)
BUN: 40 mg/dL — ABNORMAL HIGH (ref 6–20)
CO2: 22 mmol/L (ref 22–32)
Calcium: 8.6 mg/dL — ABNORMAL LOW (ref 8.9–10.3)
Chloride: 102 mmol/L (ref 98–111)
Creatinine, Ser: 2.21 mg/dL — ABNORMAL HIGH (ref 0.61–1.24)
GFR, Estimated: 37 mL/min — ABNORMAL LOW (ref >60)
Glucose, Bld: 95 mg/dL (ref 70–99)
Potassium: 4.8 mmol/L (ref 3.5–5.1)
Sodium: 136 mmol/L (ref 135–145)
Total Bilirubin: 1.3 mg/dL — ABNORMAL HIGH (ref 0.3–1.2)
Total Protein: 6.2 g/dL — ABNORMAL LOW (ref 6.5–8.1)

## 2020-11-26 LAB — BLOOD GAS, VENOUS
Acid-base deficit: 2.9 mmol/L — ABNORMAL HIGH (ref 0.0–2.0)
Bicarbonate: 19.7 mmol/L — ABNORMAL LOW (ref 20.0–28.0)
FIO2: 50
O2 Saturation: 99.7 %
Patient temperature: 37
pCO2, Ven: 27 mmHg — ABNORMAL LOW (ref 44.0–60.0)
pH, Ven: 7.47 — ABNORMAL HIGH (ref 7.250–7.430)
pO2, Ven: 198 mmHg — ABNORMAL HIGH (ref 32.0–45.0)

## 2020-11-26 LAB — RESP PANEL BY RT-PCR (FLU A&B, COVID) ARPGX2
Influenza A by PCR: NEGATIVE
Influenza B by PCR: NEGATIVE
SARS Coronavirus 2 by RT PCR: NEGATIVE

## 2020-11-26 LAB — BLOOD GAS, ARTERIAL
Acid-base deficit: 3.6 mmol/L — ABNORMAL HIGH (ref 0.0–2.0)
Bicarbonate: 18.9 mmol/L — ABNORMAL LOW (ref 20.0–28.0)
O2 Saturation: 98 %
Patient temperature: 37
pCO2 arterial: 26 mmHg — ABNORMAL LOW (ref 32.0–48.0)
pH, Arterial: 7.47 — ABNORMAL HIGH (ref 7.350–7.450)
pO2, Arterial: 98 mmHg (ref 83.0–108.0)

## 2020-11-26 LAB — URINALYSIS, COMPLETE (UACMP) WITH MICROSCOPIC
Bacteria, UA: NONE SEEN
Bilirubin Urine: NEGATIVE
Glucose, UA: NEGATIVE mg/dL
Hgb urine dipstick: NEGATIVE
Ketones, ur: NEGATIVE mg/dL
Leukocytes,Ua: NEGATIVE
Nitrite: NEGATIVE
Protein, ur: NEGATIVE mg/dL
Specific Gravity, Urine: 1.015 (ref 1.005–1.030)
Squamous Epithelial / HPF: NONE SEEN (ref 0–5)
pH: 5 (ref 5.0–8.0)

## 2020-11-26 LAB — URINE DRUG SCREEN, QUALITATIVE (ARMC ONLY)
Amphetamines, Ur Screen: NOT DETECTED
Barbiturates, Ur Screen: NOT DETECTED
Benzodiazepine, Ur Scrn: NOT DETECTED
Cannabinoid 50 Ng, Ur ~~LOC~~: NOT DETECTED
Cocaine Metabolite,Ur ~~LOC~~: NOT DETECTED
MDMA (Ecstasy)Ur Screen: NOT DETECTED
Methadone Scn, Ur: NOT DETECTED
Opiate, Ur Screen: NOT DETECTED
Phencyclidine (PCP) Ur S: NOT DETECTED
Tricyclic, Ur Screen: POSITIVE — AB

## 2020-11-26 LAB — TROPONIN I (HIGH SENSITIVITY)
Troponin I (High Sensitivity): 9 ng/L (ref <18)
Troponin I (High Sensitivity): <2 ng/L (ref <18)

## 2020-11-26 LAB — CBG MONITORING, ED: Glucose-Capillary: 95 mg/dL (ref 70–99)

## 2020-11-26 LAB — VALPROIC ACID LEVEL: Valproic Acid Lvl: 70 ug/mL (ref 50.0–100.0)

## 2020-11-26 LAB — SALICYLATE LEVEL: Salicylate Lvl: <7 mg/dL — ABNORMAL LOW (ref 7.0–30.0)

## 2020-11-26 LAB — BRAIN NATRIURETIC PEPTIDE: B Natriuretic Peptide: 30.1 pg/mL (ref 0.0–100.0)

## 2020-11-26 LAB — LACTIC ACID, PLASMA
Lactic Acid, Venous: 2.8 mmol/L (ref 0.5–1.9)
Lactic Acid, Venous: 3.7 mmol/L (ref 0.5–1.9)

## 2020-11-26 LAB — ACETAMINOPHEN LEVEL: Acetaminophen (Tylenol), Serum: <10 ug/mL — ABNORMAL LOW (ref 10–30)

## 2020-11-26 LAB — PROCALCITONIN: Procalcitonin: <0.1 ng/mL

## 2020-11-26 LAB — CK: Total CK: 609 U/L — ABNORMAL HIGH (ref 49–397)

## 2020-11-26 MED ORDER — ROCURONIUM BROMIDE 50 MG/5ML IV SOLN
100.0000 mg | Freq: Once | INTRAVENOUS | Status: AC
  Administered 2020-11-26: 100 mg via INTRAVENOUS

## 2020-11-26 MED ORDER — SODIUM CHLORIDE 0.9 % IV SOLN
2.0000 g | Freq: Once | INTRAVENOUS | Status: AC
  Administered 2020-11-26: 2 g via INTRAVENOUS
  Filled 2020-11-26: qty 2

## 2020-11-26 MED ORDER — DEXAMETHASONE SODIUM PHOSPHATE 10 MG/ML IJ SOLN
10.0000 mg | Freq: Once | INTRAMUSCULAR | Status: AC
  Administered 2020-11-26: 10 mg via INTRAVENOUS
  Filled 2020-11-26: qty 1

## 2020-11-26 MED ORDER — CHLORHEXIDINE GLUCONATE 0.12% ORAL RINSE (MEDLINE KIT)
15.0000 mL | Freq: Two times a day (BID) | OROMUCOSAL | Status: DC
  Administered 2020-11-27 (×2): 15 mL via OROMUCOSAL

## 2020-11-26 MED ORDER — SODIUM CHLORIDE 0.9 % IV SOLN: 2.0000 g | Freq: Two times a day (BID) | INTRAVENOUS | Status: DC

## 2020-11-26 MED ORDER — DEXTROSE 5 % IV SOLN
10.0000 mg/kg | Freq: Two times a day (BID) | INTRAVENOUS | Status: DC
  Administered 2020-11-26: 855 mg via INTRAVENOUS
  Filled 2020-11-26 (×2): qty 17.1

## 2020-11-26 MED ORDER — VANCOMYCIN HCL IN DEXTROSE 1-5 GM/200ML-% IV SOLN
1000.0000 mg | Freq: Once | INTRAVENOUS | Status: AC
Start: 2020-11-26 — End: 2020-11-26
  Administered 2020-11-26: 1000 mg via INTRAVENOUS
  Filled 2020-11-26: qty 200

## 2020-11-26 MED ORDER — SODIUM CHLORIDE 0.9 % IV BOLUS
1000.0000 mL | Freq: Once | INTRAVENOUS | Status: AC
  Administered 2020-11-26: 1000 mL via INTRAVENOUS

## 2020-11-26 MED ORDER — CHLORHEXIDINE GLUCONATE CLOTH 2 % EX PADS
6.0000 | MEDICATED_PAD | Freq: Every day | CUTANEOUS | Status: DC
  Administered 2020-11-28 – 2020-11-30 (×2): 6 via TOPICAL

## 2020-11-26 MED ORDER — DEXTROSE 5 % IV SOLN: 10.0000 mg/kg | Freq: Once | INTRAVENOUS | Status: DC

## 2020-11-26 MED ORDER — ORAL CARE MOUTH RINSE
15.0000 mL | OROMUCOSAL | Status: DC
  Administered 2020-11-27 (×6): 15 mL via OROMUCOSAL

## 2020-11-26 MED ORDER — LEVETIRACETAM IN NACL 1500 MG/100ML IV SOLN
1500.0000 mg | Freq: Once | INTRAVENOUS | Status: AC
  Administered 2020-11-26: 1500 mg via INTRAVENOUS
  Filled 2020-11-26: qty 100

## 2020-11-26 MED ORDER — LEVETIRACETAM IN NACL 500 MG/100ML IV SOLN
500.0000 mg | Freq: Two times a day (BID) | INTRAVENOUS | Status: DC
  Administered 2020-11-27 – 2020-11-30 (×8): 500 mg via INTRAVENOUS
  Filled 2020-11-26 (×9): qty 100

## 2020-11-26 MED ORDER — LACTATED RINGERS IV BOLUS
1000.0000 mL | Freq: Once | INTRAVENOUS | Status: AC
  Administered 2020-11-26: 1000 mL via INTRAVENOUS

## 2020-11-26 MED ORDER — POLYETHYLENE GLYCOL 3350 17 G PO PACK: 17.0000 g | PACK | Freq: Every day | ORAL | Status: DC | PRN

## 2020-11-26 MED ORDER — PROPOFOL 1000 MG/100ML IV EMUL
INTRAVENOUS | Status: AC
  Filled 2020-11-26: qty 100

## 2020-11-26 MED ORDER — NALOXONE HCL 2 MG/2ML IJ SOSY: 0.4000 mg | PREFILLED_SYRINGE | Freq: Once | INTRAMUSCULAR | Status: AC

## 2020-11-26 MED ORDER — SODIUM CHLORIDE 0.9 % IV SOLN: 250.0000 mL | INTRAVENOUS | Status: DC

## 2020-11-26 MED ORDER — SODIUM CHLORIDE 0.9 % IV SOLN
100.0000 mg | Freq: Two times a day (BID) | INTRAVENOUS | Status: DC
  Administered 2020-11-27: 100 mg via INTRAVENOUS
  Filled 2020-11-26 (×2): qty 10

## 2020-11-26 MED ORDER — DOCUSATE SODIUM 100 MG PO CAPS: 100.0000 mg | ORAL_CAPSULE | Freq: Two times a day (BID) | ORAL | Status: DC | PRN

## 2020-11-26 MED ORDER — ACETAMINOPHEN 650 MG RE SUPP
650.0000 mg | Freq: Once | RECTAL | Status: AC
  Administered 2020-11-26: 17:00:00 650 mg via RECTAL
  Filled 2020-11-26: qty 1

## 2020-11-26 MED ORDER — ETOMIDATE 2 MG/ML IV SOLN
10.0000 mg | Freq: Once | INTRAVENOUS | Status: AC
  Administered 2020-11-26: 10 mg via INTRAVENOUS

## 2020-11-26 MED ORDER — MIDAZOLAM 50MG/50ML (1MG/ML) PREMIX INFUSION
0.5000 mg/h | INTRAVENOUS | Status: DC
  Administered 2020-11-26: 0.5 mg/h via INTRAVENOUS
  Filled 2020-11-26: qty 50

## 2020-11-26 MED ORDER — FENTANYL 2500MCG IN NS 250ML (10MCG/ML) PREMIX INFUSION
0.0000 ug/h | INTRAVENOUS | Status: DC
  Administered 2020-11-26: 25 ug/h via INTRAVENOUS
  Filled 2020-11-26: qty 250

## 2020-11-26 MED ORDER — SODIUM CHLORIDE 0.9 % IV SOLN: INTRAVENOUS | Status: DC

## 2020-11-26 MED ORDER — NOREPINEPHRINE 4 MG/250ML-% IV SOLN
2.0000 ug/min | INTRAVENOUS | Status: DC
Start: 2020-11-26 — End: 2020-11-26
  Administered 2020-11-26: 2 ug/min via INTRAVENOUS
  Filled 2020-11-26: qty 250

## 2020-11-26 MED ORDER — PROPOFOL 1000 MG/100ML IV EMUL
5.0000 ug/kg/min | INTRAVENOUS | Status: DC
  Administered 2020-11-27: 5 ug/kg/min via INTRAVENOUS

## 2020-11-26 MED ORDER — DIVALPROEX SODIUM ER 500 MG PO TB24: 1000.0000 mg | ORAL_TABLET | Freq: Every day | ORAL | Status: DC

## 2020-11-26 MED ORDER — LEVETIRACETAM IN NACL 1000 MG/100ML IV SOLN
1000.0000 mg | Freq: Two times a day (BID) | INTRAVENOUS | Status: DC
  Administered 2020-11-27: 1000 mg via INTRAVENOUS
  Filled 2020-11-26: qty 100

## 2020-11-26 MED ORDER — LORAZEPAM 2 MG/ML IJ SOLN: 2.0000 mg | Freq: Once | INTRAMUSCULAR | Status: AC

## 2020-11-26 MED ORDER — NALOXONE HCL 2 MG/2ML IJ SOSY
PREFILLED_SYRINGE | INTRAMUSCULAR | Status: AC
  Administered 2020-11-26: 17:00:00 0.4 mg via INTRAVENOUS
  Filled 2020-11-26: qty 2

## 2020-11-26 MED ORDER — HEPARIN SODIUM (PORCINE) 5000 UNIT/ML IJ SOLN
5000.0000 [IU] | Freq: Three times a day (TID) | INTRAMUSCULAR | Status: DC
  Administered 2020-11-27 – 2020-11-29 (×9): 5000 [IU] via SUBCUTANEOUS
  Filled 2020-11-26 (×7): qty 1

## 2020-11-26 MED ORDER — LORAZEPAM 2 MG/ML IJ SOLN
INTRAMUSCULAR | Status: AC
  Administered 2020-11-26: 17:00:00 2 mg via INTRAVENOUS
  Filled 2020-11-26: qty 1

## 2020-11-26 MED ORDER — GABAPENTIN 300 MG PO CAPS
600.0000 mg | ORAL_CAPSULE | Freq: Three times a day (TID) | ORAL | Status: DC
  Filled 2020-11-26: qty 2

## 2020-11-26 MED ORDER — VANCOMYCIN VARIABLE DOSE PER UNSTABLE RENAL FUNCTION (PHARMACIST DOSING): Status: DC

## 2020-11-26 MED ORDER — VANCOMYCIN HCL IN DEXTROSE 1-5 GM/200ML-% IV SOLN
1000.0000 mg | Freq: Once | INTRAVENOUS | Status: AC
  Administered 2020-11-26: 1000 mg via INTRAVENOUS
  Filled 2020-11-26: qty 200

## 2020-11-26 MED ORDER — ETOMIDATE 2 MG/ML IV SOLN: 20.0000 mg | Freq: Once | INTRAVENOUS | Status: DC

## 2020-11-26 NOTE — Consult Note (Signed)
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 Pupils are reactive to light. He has no clear-cut gaze deviation but appears to withdraw to more on the right side as compared to left on painful stimulation. His head appears to be turned towards the left side. I suspect we are dealing with subclinical status epilepticus. We will continue him on Versed. I would recommend loading IV Keppra. I would suggest getting a CTA of the head and neck to rule out any large vessel occlusion. He is not a candidate for thrombolytics based on the duration of symptoms as the last known well reported was around 1600. Also the clinical suspicion of stroke is low. I would also suggest getting an MRI. Because of the fever of 103, would recommend covering him with empiric antibiotics for CNS coverage and also starting him on acyclovir. He might need a lumbar puncture. I would also suggest considering continuous EEG monitoring.  CT HEAD: Showed No Acute Hemorrhage or Acute Core Infarct  Our recommendations are outlined below.  Diagnostic Studies: MRI brain w/wo contrast Stat EEG Continuous EEG Monitoring  Laboratory Studies: AED levels  Medications: Keppra 1gm now Keppra 500 mg BID  Nursing Recommendations: Maintain Euglycemia and Euthermia Neuro checks q1-2 hrs during ICU stay if critical Once stable neuro checks q 4hrs  DVT Prophylaxis: Choice of Primary Team SCDs, Pneumatic Compression Lovenox or LMW Heparin  Disposition: Neurology will follow   Metrics: [facility] Notification Time: 11/26/2020 20:36:49 Stamp Time: 11/26/2020  20:39:00 Callback Response Time: 11/26/2020 20:41:42   ----------------------------------------------------------------------------------------------------  Chief Complaint: Seizures/unresponsiveness  History of Present Illness: Patient is a 45 year old Male.  45 year old male with past medical history of seizures was brought in because he was found unresponsive. Patient is currently intubated. He is on low-dose fentanyl and Versed. He initially had some left gaze deviation but later on was noted to have right gaze deviation. He is not following any commands. He has history of seizures and is supposed to be on Depakote. Neurology consultation was requested. Patient initially when he came had a fever of 103. After Tylenol, he is back to his baseline. Patient has no other medical problems reported in the chart. He is unable to provide any history at this point.    Anticoagulant use:  Unknown  Antiplatelet use: Unknown    Examination: BP(108//74), Pulse(60), Blood Glucose(95)   Coma Exam:  Ventilator:  Yes Sedated: Yes Paralyzed: No Breathes spontaneously above ventilator rate: Yes  Responds to Voice:  No  Responds to Sternal rub:  Yes Postures: No  Withdraws limb from painful stimulation:  Yes R Arm, R Leg  Spontaneous limb movement:  Right arm: No Left arm: No Right leg: No Left leg: No  Pupils:  Equal and Reactive  Corneal Reflexes:  Present  Oculocephalic Reflexes:  Normal  Cough/Gag Reflexes:  Present  Free Text:  Patient appears to withdraw more on the right side as compared to left.     Patient is being evaluated for possible acute neurologic impairment and high probability of imminent or life - threatening deterioration.I spent total of 30 minutes providing care to this patient, including time for face to face visit  via telemedicine, review of medical records, imaging studies and discussion of findings with providers, the patient and / or family.   Dr  Hedy Camara   [facility] [phone]  Case [id number]

## 2020-11-26 NOTE — ED Notes (Signed)
Good color change. 25 at the lips. 8 size tube.

## 2020-11-26 NOTE — ED Provider Notes (Addendum)
[facility] Emergency Department Provider Note  ____________________________________________   Event Date/Time   First MD Initiated Contact with Patient 11/26/20 1652     (approximate)  I have reviewed the triage vital signs and the nursing notes.   HISTORY  Chief Complaint AMS   HPI Russell Buchanan is a 45 y.o. male with bipolar, schizophrenia, seizures who comes in with unresponsiveness.  Patient was outside walking when people witnessed that he fell down became unresponsive.  Initially patient was gazing to the left but then switched to gaze to the right.  Patient 83% on room air and placed on nonrebreather.  Unable to get full HPI due to patient's altered mental status          Past Medical History:  Diagnosis Date   Antisocial personality disorder (HCC)    Bipolar disorder (HCC)    Environmental and seasonal allergies    GERD (gastroesophageal reflux disease)    Headache    Hyperlipidemia    Infected elbow (HCC)    Schizophrenia (HCC)    Seizures (HCC)    Tremors of nervous system     Patient Active Problem List   Diagnosis Date Noted   Seizure (HCC) 01/10/2020   AMS (altered mental status) 06/25/2019   Bipolar disorder (HCC)    Antisocial personality disorder (HCC)    Acute encephalopathy 12/04/2016   GERD (gastroesophageal reflux disease) 12/04/2016   HLD (hyperlipidemia) 12/04/2016   Schizophrenia (HCC) 11/15/2016   Medication side effects 11/15/2016    Past Surgical History:  Procedure Laterality Date   CLOSED REDUCTION WRIST FRACTURE Left    HARDWARE REMOVAL Right 04/14/2018   Procedure: HARDWARE REMOVAL-RIGHT ELBOW;  Surgeon: Deeann Saint, MD;  Location: [facility] ORS;  Service: Orthopedics;  Laterality: Right;   ORIF ELBOW FRACTURE Right    SCALP LACERATION REPAIR     WRIST RECONSTRUCTION Left     Prior to Admission medications   Medication Sig Start Date End Date Taking? Authorizing Provider  albuterol (PROVENTIL  HFA;VENTOLIN HFA) 108 (90 Base) MCG/ACT inhaler Inhale 2 puffs into the lungs every 6 (six) hours as needed for wheezing or shortness of breath. 01/30/17   Merrily Brittle, MD  benztropine (COGENTIN) 1 MG tablet Take 1 mg by mouth 3 (three) times daily.     [provider]  cetirizine (ZYRTEC) 10 MG tablet Take 10 mg by mouth daily.    [provider]  divalproex (DEPAKOTE) 500 MG DR tablet Take 1 tablet (500 mg total) by mouth every morning. 06/27/19 06/26/20  Darlin Priestly, MD  divalproex (DEPAKOTE) 500 MG DR tablet Take 2 tablets (1,000 mg total) by mouth at bedtime. 06/27/19   Darlin Priestly, MD  docusate sodium (COLACE) 100 MG capsule Take 100 mg by mouth daily.    [provider]  EPINEPHrine 0.3 mg/0.3 mL IJ SOAJ injection Inject 0.3 mg into the muscle as needed (allergic reactions).     [provider]  escitalopram (LEXAPRO) 20 MG tablet Take 20 mg by mouth daily.    [provider]  Fremanezumab-vfrm (AJOVY) 225 MG/1.5ML SOSY Inject into the skin every 28 (twenty-eight) days.    [provider]  furosemide (LASIX) 40 MG tablet Take 1 tablet (40 mg total) by mouth daily for 30 days. 06/25/18 01/10/20  Minna Antis, MD  gabapentin (NEURONTIN) 600 MG tablet Take 600 mg by mouth 3 (three) times daily.    [provider]  gemfibrozil (LOPID) 600 MG tablet Take 600 mg by mouth  2 (two) times daily before a meal.    [provider]  ketoconazole (NIZORAL) 2 % cream Apply 1 application topically 2 (two) times daily.    [provider]  Lacosamide (VIMPAT) 100 MG TABS Take 100 mg by mouth 2 (two) times daily.    [provider]  lidocaine (LIDODERM) 5 % Place 1 patch onto the skin daily. Remove & Discard patch within 12 hours or as directed by MD 04/17/20   Enid Derry, PA-C  linaclotide [facility]) 72 MCG capsule Take 72 mcg by mouth daily.     [provider]  lipase/protease/amylase (CREON) 36000 UNITS CPEP  capsule Take 36,000 Units by mouth 3 (three) times daily with meals. 2 caps withmeals and 1 with snacks    [provider]  meclizine (ANTIVERT) 25 MG tablet Take 1 tablet (25 mg total) by mouth 3 (three) times daily as needed for dizziness. 04/05/20   Delton Prairie, MD  meloxicam (MOBIC) 15 MG tablet Take 1 tablet (15 mg total) by mouth daily as needed for pain. 06/27/19   Darlin Priestly, MD  omeprazole (PRILOSEC) 40 MG capsule Take 40 mg by mouth 2 (two) times daily before a meal. 04/16/19   [provider]  ondansetron (ZOFRAN) 8 MG tablet Take 8 mg by mouth every 8 (eight) hours as needed for nausea. 06/16/19   [provider]  Paliperidone Palmitate ER (INVEGA TRINZA) 819 MG/2.625ML SUSY Inject into the muscle every 3 (three) months.    [provider]  polyethylene glycol (MIRALAX / GLYCOLAX) packet Take 17 g by mouth daily.    [provider]  potassium chloride SA (KLOR-CON) 20 MEQ tablet Take 20 mEq by mouth daily.    [provider]  pravastatin (PRAVACHOL) 40 MG tablet Take 40 mg by mouth every evening. 06/24/19   [provider]  propranolol ER (INDERAL LA) 160 MG SR capsule Take 160 mg by mouth daily.    [provider]  QUEtiapine (SEROQUEL) 100 MG tablet Take 100 mg by mouth at bedtime.  06/24/19   [provider]  simethicone (MYLICON) 80 MG chewable tablet Chew 80 mg by mouth 2 (two) times daily.    [provider]  SUMAtriptan (IMITREX) 100 MG tablet Take 100 mg by mouth every 2 (two) hours as needed for migraine. May repeat in 2 hours if headache persists or recurs.    [provider]  Tiotropium Bromide-Olodaterol (STIOLTO RESPIMAT IN) Inhale 2 puffs into the lungs daily.    [provider]    Allergies Bee venom  Family History  Family history unknown: Yes    Social History Social History   Tobacco Use   Smoking status: Every Day    Packs/day: 0.50    Years: 7.00    Pack  years: 3.50    Types: Cigarettes   Smokeless tobacco: Former    Types: Chew, Snuff  Vaping Use   Vaping Use: Never used  Substance Use Topics   Alcohol use: No   Drug use: Not Currently      Review of Systems Unable to get ROS due to AMS. ____________________________________________   PHYSICAL EXAM:  VITAL SIGNS: ED Triage Vitals  Enc Vitals Group     BP 11/26/20 1647 (!) 90/53     Pulse Rate 11/26/20 1648 95     Resp 11/26/20 1648 (!) 26     Temp --      Temp src --      SpO2  11/26/20 1648 97 %     Weight --      Height --      Head Circumference --      Peak Flow --      Pain Score --      Pain Loc --      Pain Edu? --      Excl. in GC? --     Constitutional: Altered unresponsive  Eyes: eyes deviates to the left then to right then looking up, occasional blinking but no blinking with corneal touch. Head: Atraumatic. Nose: No congestion/rhinnorhea. Mouth/Throat: Mucous membranes are dry .   Neck: No stridor. Trachea Midline. FROM Cardiovascular: Normal rate, regular rhythm. Grossly normal heart sounds.  Good peripheral circulation. Respiratory: Normal respiratory effort.  Clear lungs- on NRB  Gastrointestinal: Soft and nontender. No distention. No abdominal bruits.  Musculoskeletal:LL leg cellulitis- distal pulse intact No joint effusions. Neurologic:  unresponsive, occasional blinks  randomly.  Skin:  + cellulitis Psychiatric:unable to access GU: Deferred   ____________________________________________   LABS (all labs ordered are listed, but only abnormal results are displayed)  Labs Reviewed  CULTURE, BLOOD (ROUTINE X 2)  CULTURE, BLOOD (ROUTINE X 2)  RESP PANEL BY RT-PCR (FLU A&B, COVID) ARPGX2  VALPROIC ACID LEVEL  LACOSAMIDE  CBC WITH DIFFERENTIAL/PLATELET  COMPREHENSIVE METABOLIC PANEL  LACTIC ACID, PLASMA  LACTIC ACID, PLASMA  LACTIC ACID, PLASMA  LACTIC ACID, PLASMA  PROCALCITONIN  PROCALCITONIN  BRAIN NATRIURETIC PEPTIDE  URINALYSIS,  COMPLETE (UACMP) WITH MICROSCOPIC  URINE DRUG SCREEN, QUALITATIVE ([facility] ONLY)  SALICYLATE LEVEL  ACETAMINOPHEN LEVEL  AMMONIA  CBG MONITORING, ED  TROPONIN I (HIGH SENSITIVITY)   ____________________________________________   ED ECG REPORT I, Concha Se, the attending physician, personally viewed and interpreted this ECG.  Normal sinus rhythm 92, no ST elevation, no T wave inversions, normal intervals ____________________________________________  RADIOLOGY Vela Prose, personally viewed and evaluated these images (plain radiographs) as part of my medical decision making, as well as reviewing the written report by the radiologist.  ED MD interpretation:  ET tube in place  Official radiology report(s): DG Tibia/Fibula Left  Result Date: 11/26/2020 CLINICAL DATA:  Red and swollen left leg. EXAM: LEFT TIBIA AND FIBULA - 2 VIEW COMPARISON:  February 16, 2020 FINDINGS: There is no evidence of fracture or other focal bone lesions. Soft tissues are unremarkable. IMPRESSION: Negative. Electronically Signed   By: Aram Candela M.D.   On: 11/26/2020 19:13   CT Head Wo Contrast  Result Date: 11/26/2020 CLINICAL DATA:  Status post fall. EXAM: CT HEAD WITHOUT CONTRAST TECHNIQUE: Contiguous axial images were obtained from the base of the skull through the vertex without intravenous contrast. COMPARISON:  June 08, 2020 FINDINGS: Brain: No evidence of acute infarction, hemorrhage, hydrocephalus, extra-axial collection or mass lesion/mass effect. Vascular: No hyperdense vessel or unexpected calcification. Skull: Normal. Negative for fracture or focal lesion. Sinuses/Orbits: Bilateral anterior maxillary sinus polyps versus mucous retention cysts are seen. Mild bilateral ethmoid sinus mucosal thickening is also noted. Other: Endotracheal and orogastric tubes are present. IMPRESSION: 1. No acute intracranial abnormality. 2. Bilateral maxillary sinus and bilateral ethmoid sinus disease.  Electronically Signed   By: Aram Candela M.D.   On: 11/26/2020 19:18   CT Cervical Spine Wo Contrast  Result Date: 11/26/2020 CLINICAL DATA:  Status post fall. EXAM: CT CERVICAL SPINE WITHOUT CONTRAST TECHNIQUE: Multidetector CT imaging of the cervical spine was performed without intravenous contrast. Multiplanar CT image reconstructions were also generated. COMPARISON:  April 17, 2020 FINDINGS: Alignment: Normal. Skull base and vertebrae: No acute fracture. No primary bone lesion or focal pathologic process. Soft tissues and spinal canal: No prevertebral fluid or swelling. No visible canal hematoma. Disc levels: Normal multilevel endplates are seen with normal multilevel intervertebral disc spaces. Mild, bilateral multilevel facet joint hypertrophy is noted. Upper chest: Negative. Other: Orogastric and nasogastric tubes are present. IMPRESSION: No acute cervical spine fracture or subluxation. Electronically Signed   By: Aram Candela M.D.   On: 11/26/2020 19:38   CT Tibia Fibula Left Wo Contrast  Result Date: 11/26/2020 CLINICAL DATA:  Left lower leg pain. EXAM: CT OF THE LOWER LEFT EXTREMITY WITHOUT CONTRAST TECHNIQUE: Multidetector CT imaging of the lower left extremity was performed according to the standard protocol. COMPARISON:  Left tibia and fibula x-rays from same day. Left ankle x-rays dated February 16, 2020. FINDINGS: Bones/Joint/Cartilage No acute fracture or dislocation. Old partially united fracture of the inferior lateral malleolus. Chronic avulsion fracture of the anterior medial malleolus. Joint spaces are preserved. Osteopenia. No joint effusion. Ligaments Ligaments are suboptimally evaluated by CT. Muscles and Tendons Grossly intact. No muscle atrophy. Soft tissue Mild circumferential soft tissue swelling of the lower leg. No fluid collection or hematoma. No soft tissue mass. IMPRESSION: 1. Mild circumferential soft tissue swelling of the lower leg, nonspecific. 2. No acute  osseous abnormality. 3. Old bimalleolar fractures. Electronically Signed   By: Obie Dredge M.D.   On: 11/26/2020 19:01   DG Chest Portable 1 View  Result Date: 11/26/2020 CLINICAL DATA:  Status post intubation. EXAM: PORTABLE CHEST 1 VIEW COMPARISON:  May 21, 2020 FINDINGS: An endotracheal tube is seen with its distal tip approximately 6.1 cm from the carina. A nasogastric tube is also noted with its distal tip approximately 5.0 cm distal to the gastroesophageal junction. There is no evidence of acute infiltrate, pleural effusion or pneumothorax. The heart size and mediastinal contours are within normal limits. The visualized skeletal structures are unremarkable. IMPRESSION: Endotracheal tube and nasogastric tube positioning, as described above. Further advancement of the nasogastric tube by approximately 6 cm is recommended to decrease the risk of aspiration. Electronically Signed   By: Aram Candela M.D.   On: 11/26/2020 19:11   CT CHEST ABDOMEN PELVIS WO CONTRAST  Result Date: 11/26/2020 CLINICAL DATA:  Status post fall with subsequent unresponsiveness. EXAM: CT CHEST, ABDOMEN AND PELVIS WITHOUT CONTRAST TECHNIQUE: Multidetector CT imaging of the chest, abdomen and pelvis was performed following the standard protocol without IV contrast. COMPARISON:  Oct 28, 2020 FINDINGS: CT CHEST FINDINGS Cardiovascular: No significant vascular findings. Normal heart size. No pericardial effusion. Mediastinum/Nodes: No enlarged mediastinal, hilar, or axillary lymph nodes. Thyroid gland, trachea, and esophagus demonstrate no significant findings. Lungs/Pleura: Endotracheal and orogastric tubes are in place. Mild atelectasis is seen within the posterior aspect of the bilateral lower lobes. There is no evidence of acute infiltrate, pleural effusion or pneumothorax. Musculoskeletal: Acute fracture of the distal left clavicle is seen (axial CT images 6 through 10, CT series 2). Chronic sixth, seventh, eighth,  ninth and tenth left rib fractures are seen. Chronic posterior tenth and eleventh right rib fractures are also noted. CT ABDOMEN PELVIS FINDINGS Hepatobiliary: No focal liver abnormality is seen. No gallstones, gallbladder wall thickening, or biliary dilatation. Pancreas: Unremarkable. No pancreatic ductal dilatation or surrounding inflammatory changes. Spleen: Normal in size without focal abnormality. Adrenals/Urinary Tract: Adrenal glands are unremarkable. Kidneys are normal, without renal calculi, focal lesion, or hydronephrosis. A Foley catheter is present within  the urinary bladder. Stomach/Bowel: Stomach is within normal limits. The distal tip of the orogastric tube is noted just beyond the gastroesophageal junction. Appendix appears normal. Stool and retained contrast is noted throughout the large bowel. No evidence of bowel wall thickening, distention, or inflammatory changes. Vascular/Lymphatic: Aortic atherosclerosis. No enlarged abdominal or pelvic lymph nodes. Reproductive: Prostate is unremarkable. Other: No abdominal wall hernia or abnormality. No abdominopelvic ascites. Musculoskeletal: A chronic fracture of the left transverse process of the L4 vertebral body is seen. No acute or significant osseous findings. IMPRESSION: 1. Acute fracture of the distal left clavicle. 2. Chronic L4 vertebral body left transverse process fracture with multiple chronic bilateral rib fractures. 3. Orogastric tube positioning, as described above. Further advancement of the OG tube by approximately 6 cm is recommended to further decrease the risk of aspiration. 4. No acute or active process within the abdomen or pelvis. Electronically Signed   By: Aram Candela M.D.   On: 11/26/2020 19:33    ____________________________________________   PROCEDURES  Procedure(s) performed (including Critical Care):  .Critical Care  Date/Time: 11/27/2020 8:26 AM Performed by: Concha Se, MD Authorized by: Concha Se,  MD   Critical care provider statement:    Critical care time (minutes):  45   Critical care was necessary to treat or prevent imminent or life-threatening deterioration of the following conditions:  Sepsis, shock and CNS failure or compromise   Critical care was time spent personally by me on the following activities:  Discussions with consultants, evaluation of patient's response to treatment, examination of patient, ordering and performing treatments and interventions, ordering and review of laboratory studies, ordering and review of radiographic studies, pulse oximetry, re-evaluation of patient's condition, obtaining history from patient or surrogate and review of old charts .1-3 Lead EKG Interpretation  Date/Time: 11/27/2020 8:26 AM Performed by: Concha Se, MD Authorized by: Concha Se, MD     Interpretation: normal     ECG rate:  90s   ECG rate assessment: normal     Rhythm: sinus rhythm     Ectopy: none     Conduction: normal   Procedure Name: Intubation Date/Time: 11/27/2020 8:26 AM Performed by: Concha Se, MD Pre-anesthesia Checklist: Patient identified Oxygen Delivery Method: Ambu bag Preoxygenation: Pre-oxygenation with 100% oxygen Induction Type: Rapid sequence Ventilation: Mask ventilation without difficulty Laryngoscope Size: Glidescope and 3 Tube size: 8.0 mm Number of attempts: 1 Airway Equipment and Method: Video-laryngoscopy Placement Confirmation: ETT inserted through vocal cords under direct vision, Positive ETCO2, CO2 detector and Breath sounds checked- equal and bilateral Dental Injury: Teeth and Oropharynx as per pre-operative assessment  Difficulty Due To: Difficulty was unanticipated      ____________________________________________   INITIAL IMPRESSION / ASSESSMENT AND PLAN / ED COURSE  Russell Buchanan was evaluated in Emergency Department on 11/26/2020 for the symptoms described in the history of present illness. He was evaluated in the  context of the global COVID-19 pandemic, which necessitated consideration that the patient might be at risk for infection with the SARS-CoV-2 virus that causes COVID-19. Institutional protocols and algorithms that pertain to the evaluation of patients at risk for COVID-19 are in a state of rapid change based on information released by regulatory bodies including the CDC and federal and state organizations. These policies and algorithms were followed during the patient's care in the ED.    Pt GCS 3 except eyes occasionally opening and blinking but not to command, or stimuli. Unable to protect airway. Hypotensive, febrile. Narcan  trialed without effect. Consider seizure but his eyes were crossing midline and trialed 2mg  ativan with no improvement. Possible head bleed due to fall. Does have fever so maybe septic shock- broad spectrum anti order/fluid order. Does have LL leg cellulitis. CT order to evaluate for nec fasc but no crepitus on exam.  Given AMS did cover for menigiits.   Pt intubated, pressures low so started on levophed low dose.   Pan ct to evaluate for trauma/infection- grossly number does have clavicle fracture    7:07 PM patient is only on 5 mcg of Levophed.  Pressures are looking good.  Patient has broad-spectrum antibiotics going.  Patient is been sedated with Versed and fentanyl.  CT scans were reassuring other than fractured clavicle  [facility].  Discussed with neurology who recommended loading with Keppra and CTA.  Patient's vent settings were adjusted with the VBG.  We will get a repeat ABG if he still here  Patient needs to be transferred to higher level of care.  Unable to get consent from the facility but I do believe that this is an emergency and that he needs to be over there in order to get EEG, ICU care therefore we will proceed with transfer due to the emergency nature of this.  CTA will need to be done there given pt left  prior to it being done here and did not want to delay transprot.          ____________________________________________   FINAL CLINICAL IMPRESSION(S) / ED DIAGNOSES   Final diagnoses:  AMS (altered mental status)  Acute respiratory failure with hypoxia (HCC)  Septic shock (HCC)  Closed nondisplaced fracture of left clavicle, unspecified part of clavicle, initial encounter  Cellulitis of left lower extremity      MEDICATIONS GIVEN DURING THIS VISIT:  Medications  acetaminophen (TYLENOL) suppository 650 mg (has no administration in time range)  sodium chloride 0.9 % bolus 1,000 mL (has no administration in time range)  dexamethasone (DECADRON) injection 10 mg (has no administration in time range)  ceFEPIme (MAXIPIME) 2 g in sodium chloride 0.9 % 100 mL IVPB (has no administration in time range)  vancomycin (VANCOCIN) IVPB 1000 mg/200 mL premix (has no administration in time range)  acyclovir (ZOVIRAX) 10 mg/kg in dextrose 5 % 250 mL IVPB (has no administration in time range)  LORazepam (ATIVAN) injection 2 mg (2 mg Intravenous Given 11/26/20 1650)  naloxone (NARCAN) injection 0.4 mg (0.4 mg Intravenous Given 11/26/20 1649)  rocuronium (ZEMURON) injection 100 mg (100 mg Intravenous Given 11/26/20 1702)  sodium chloride 0.9 % bolus 1,000 mL (1,000 mLs Intravenous New Bag/Given 11/26/20 1700)  etomidate (AMIDATE) injection 10 mg (10 mg Intravenous Given 11/26/20 1702)     ED Discharge Orders     None        Note:  This document was prepared using Dragon voice recognition software and may include unintentional dictation errors.    Concha SeFunke, Mekesha Solomon E, MD 11/27/20 [id number]    Concha SeFunke, Dejana Pugsley E, MD 11/27/20 [phone]

## 2020-11-26 NOTE — Progress Notes (Addendum)
[facility] Physician-Brief Progress Note Patient Name: Russell Buchanan DOB: Aug 16, 1975 MRN: [id number]   Date of Service  11/26/2020  HPI/Events of Note  Brief HPI: Transfer from : Pt in via EMS from group home; walking outside per people are scene when he then fell down and became unresponsive  45 year old male with past medical history of seizures was brought in because he was found unresponsive. Patient is currently intubated. Fever in ED. Sepsis.  Notes, labs reviewed.   Data: ABG compensated metabolic acisosis and resp alkalosis. Left clavicular #. No ICB or CVA, no cervical spine #. CT abd/chest: OG needs advancement, no acute process. Cr > 2. AST 51, CK 609, LA 3.7, trop x 2 down to < 2. Pro cal < , WBC normal. Hg 11.5 BNP 34 EKG: qtc 420.  UA neg. Tox: trycyclic +.    Camera: On Vent 620 ( < 8 ml/ibw)/10/15/48%. PIP 20. In synchrony. Sinus brady at 55, sats 99%, MAP 89. Temp 36. On levo at 12 mcg/min. Fenta 50, midaz 1  A/P: Encephalopathy: septic shock: source not clear. Covid/flu neg- due to fever and AMS. Consider LP, unable to get consent as per CCM note. LA improving, normal wbc. Follow cultures. EEG -Vanc, CTX, acyclovir - follow CTA brain and neck.    2. Hx of epilepsy. Left-rt gaze noted by EMS. Tele neuro seen already. Sz precautions. Could have non convulsive sz?Marland Kitchen  On versed. Anti epileptic meds . Propofol  3. Schizophrenia, Bipolar on antipsychotics. Hold meds  4. On Inhalers-tiotropium/nebs on Group home Med list on epic. No mention of COPD  5. AKI. - fluids. Follow UOP. Keep MAP > 65   VTE:sq heparin.  CBG goals < 180 Vent bundle: SAT-SBT daily when stable hemodynamics. Lung protective ventilation.    eICU Interventions       Intervention Category Major Interventions: Change in mental status - evaluation and management;Seizures - evaluation and management Intermediate Interventions: Respiratory distress - evaluation and  management Evaluation Type: New Patient Evaluation  Ranee Gosselin 11/26/2020, 11:19 PM

## 2020-11-26 NOTE — ED Notes (Signed)
[facility] staff titrating gtts as needed.

## 2020-11-26 NOTE — ED Notes (Signed)
Full rainbow plus 2 sets of cultures taken on pt and sent to lab.

## 2020-11-26 NOTE — ED Triage Notes (Addendum)
Pt in via EMS from group home; walking outside per people are scene when he then fell down and became unresponsive. Initially gaze held to L with EMS then R. History of seizures.18g L wrist placed by EMS. 83% RA; 97% on non-rebreather with EMS. Resp Therapy to bedside. Red rash and heat to pt's L ankle up to mid-thigh. Dorsalis pedis pulse 2+ in L foot.

## 2020-11-26 NOTE — ED Notes (Signed)
Desiree Tele neuro consult called in

## 2020-11-26 NOTE — H&P (Addendum)
NAME:  Russell Buchanan, MRN:  [id number], DOB:  April 03, 1976, LOS: 0 ADMISSION DATE:  (Not on file), CONSULTATION DATE:  11/26/20 REFERRING MD:  Dr. Fuller Plan, CHIEF COMPLAINT:  Altered Mental status   History of Present Illness:  This is a 45 yo male with history of schizophrenia and bipolar disease, seizure disorder, migraines, Hld, GERD who presents for altered mentation. Lives at group home. While there he was noted to fall while walking outside. Was noted ot have  aleft gaze deviation with this. It than became right sided gaze deviation. In Ed was noted to cointue to be non responsive. Was given Narcan with no imprvement. Was also given 2 of ativan in case patient was was having non convulsive SE. No improvement after ativan administration. Patient was intubated for airway protection. Critical care consulted for admission.    In the ED UDS positive for tricylcics. UA not concerning for infection. LA elevated at 3.7. CK elevated at 609. Troponin normal x 2. Tylenol and Salicylate levels negative. Procalcitonin negative. Valproic avcid level therapeutic at 70. Covid testing negative. Ammonia pending.  On speaking with his caretaker at [facility] the patietn often has episodes where he will pass out just like this. Usually it seems that he comes back around in a few minutes. They will put him in his bed and he will recover. He had one of these epsiodes yesterday. The difference between yesterday and today is that he did not improve on his own after a few minutes so they called EMS.  He has been hospitalized for episodes like this in the past as well.   Pertinent  Medical History  schizophrenia  bipolar disease seizure disorder,  Migraines Hld GERD   Significant Hospital Events: Including procedures, antibiotic start and stop dates in addition to other pertinent events   Admitted to ICU on 11/26/20  Interim History / Subjective:  NA  Objective   There were no vitals taken for this  visit.    Vent Mode: AC FiO2 (%):  [50 %] 50 % Set Rate:  [16 bmp] 16 bmp Vt Set:  [500 mL] 500 mL PEEP:  [5 cmH20] 5 cmH20  No intake or output data in the 24 hours ending 11/26/20 2042 There were no vitals filed for this visit.  Examination: General: Patient sedated in no acute distress HENT: Patient wit hET tube in place. Some dried blood around the mouth. No tongue laceration noted Lungs: CTAB Cardiovascular: RRR.  Abdomen: Soft non tender and non distended Extremities: Redness noted in LLE. Up to knee. Does not go up to thigh Neuro: Eyes sluggishly reactive. Equally round. Grimaces to pain. Will withdraw LUE and LEE. Delayed on the right Reflexes rpesnt DTR in patellart2+/4 GU: Foley in place  Labs/imaging that I have personally reviewed  (right click and "Reselect all SmartList Selections" daily)  CT chest abdomen pelvis from 11/26/20  1. Acute fracture of the distal left clavicle. 2. Chronic L4 vertebral body left transverse process fracture with multiple chronic bilateral rib fractures. 3. Orogastric tube positioning, as described above. Further advancement of the OG tube by approximately 6 cm is recommended to further decrease the risk of aspiration. 4. No acute or active process within the abdomen or pelvis.  CT cervical 11/26/20  IMPRESSION: No acute cervical spine fracture or subluxation.  CT head 11/26/20  IMPRESSION: 1. No acute intracranial abnormality. 2. Bilateral maxillary sinus and bilateral ethmoid sinus disease.  CT left lower extremity:   IMPRESSION: 1. Mild circumferential  soft tissue swelling of the lower leg, nonspecific. 2. No acute osseous abnormality. 3. Old bimalleolar fractures.      Resolved Hospital Problem list   NA  Assessment & Plan:  This is a 45 yo male with history as noted above who presents for altered mentation. There is concern for Non convulsive SE vs mengintis.  Encephalopathy-Could be metabolic from septsis from  LLE wound vs from posti ictal state vs non convulsive SE. With history of uncontrolled seizures and recently having one day prior this seems likely secondary to uncontrolled seizure diosrder rather than meningitis. No leukocytosis. Did not have HA today nor any fever per his cartakers. -EEG -Vanc, CTX. -Continue home AED's which include depakot 1000 QHS, -Gabapentin 600 TID, and Vimpat 100 BID -Keppra 500 BID -Neuro consult -CTA head and neck  Shock-Patient weith lactic acidosis of 3.7 and requiring 5 of levo to [place] MAPs greater than 65. Likely related to sepsis vs sedation. Source would likely include LLE cellulits. -TTE in AM -FU resp culture, blood culture -Levo changed to epi because of some bradycardia noted  AKI-creatinine elevated at 2.2 from baseline of 0.81 Patient with mild CK leak as well -Gentle hydration with MIVF overnight -Trend CK   Bipolar schizophrenia- -Hold seroquel and lexapro for now -Can resume in future   Multiple fractures-Acute fracture of left clavicle and chronic rib fractures -Consider ortho consult in AM   Migraine disorder-Hold home medicaitons including propranolol for now.   Best Practice (right click and "Reselect all SmartList Selections" daily)   Diet/type: NPO Pain/Anxiety/Delirium protocol RASS goal: 0 VAP protocol (if indicated): Yes DVT prophylaxis: prophylactic heparin  GI prophylaxis: H2B Glucose control:  not indicated Central venous access:  N/A Arterial line:  N/A Foley:  Yes, and it is still needed Mobility:  bed rest  PT consulted: N/A Studies pending: EEG Culture data pending:blood Last reviewed culture data:today Antibiotics:CTX, Vanc, Acylcovir Antibiotic de-escalation: no,  continue current rx Stop date: to be determined  Code Status:  full code He does not have DNR at facility that caretaker is aware of. Attempted to call [name] who is the administrator for further details on code status and potential  procedures. No answer x2 Last date of multidisciplinary goals of care discussion [NA] ccm prognosis: Serious Disposition: remains critically ill, will stay in intensive care   Labs   CBC: Recent Labs  Lab 11/26/20 1654  WBC 5.0  NEUTROABS 2.1  HGB 11.7*  HCT 33.5*  MCV 91.5  PLT 131*    Basic Metabolic Panel: Recent Labs  Lab 11/26/20 1654  NA 136  K 4.8  CL 102  CO2 22  GLUCOSE 95  BUN 40*  CREATININE 2.21*  CALCIUM 8.6*   GFR: Estimated Creatinine Clearance: 46.8 mL/min (A) (by C-G formula based on SCr of 2.21 mg/dL (H)). Recent Labs  Lab 11/26/20 1651 11/26/20 1654 11/26/20 1731  PROCALCITON  --  <0.10  --   WBC  --  5.0  --   LATICACIDVEN 2.8*  --  3.7*    Liver Function Tests: Recent Labs  Lab 11/26/20 1654  AST 51*  ALT 22  ALKPHOS 92  BILITOT 1.3*  PROT 6.2*  ALBUMIN 3.2*   No results for input(s): LIPASE, AMYLASE in the last 168 hours. No results for input(s): AMMONIA in the last 168 hours.  ABG    Component Value Date/Time   PHART 7.47 (H) 11/26/2020 1907   PCO2ART 26 (L) 11/26/2020 1907   PO2ART 98  11/26/2020 1907   HCO3 18.9 (L) 11/26/2020 1907   ACIDBASEDEF 3.6 (H) 11/26/2020 1907   O2SAT 98.0 11/26/2020 1907     Coagulation Profile: No results for input(s): INR, PROTIME in the last 168 hours.  Cardiac Enzymes: Recent Labs  Lab 11/26/20 1714  CKTOTAL 609*    HbA1C: No results found for: HGBA1C  CBG: Recent Labs  Lab 11/26/20 1645  GLUCAP 95    Review of Systems:   Unable to determine given the patients status.   Past Medical History:  He,  has a past medical history of Antisocial personality disorder (HCC), Bipolar disorder (HCC), Environmental and seasonal allergies, GERD (gastroesophageal reflux disease), Headache, Hyperlipidemia, Infected elbow (HCC), Schizophrenia (HCC), Seizures (HCC), and Tremors of nervous system.   Surgical History:   Past Surgical History:  Procedure Laterality Date   CLOSED  REDUCTION WRIST FRACTURE Left    HARDWARE REMOVAL Right 04/14/2018   Procedure: HARDWARE REMOVAL-RIGHT ELBOW;  Surgeon: Deeann Saint, MD;  Location: [facility] ORS;  Service: Orthopedics;  Laterality: Right;   ORIF ELBOW FRACTURE Right    SCALP LACERATION REPAIR     WRIST RECONSTRUCTION Left      Social History:   reports that he has been smoking cigarettes. He has a 3.50 pack-year smoking history. He has quit using smokeless tobacco.  His smokeless tobacco use included chew and snuff. He reports previous drug use. He reports that he does not drink alcohol.   Family History:  His Family history is unknown by patient.   Allergies Allergies  Allergen Reactions   Bee Venom Hives     Home Medications  Prior to Admission medications   Medication Sig Start Date End Date Taking? Authorizing Provider  albuterol (PROVENTIL HFA;VENTOLIN HFA) 108 (90 Base) MCG/ACT inhaler Inhale 2 puffs into the lungs every 6 (six) hours as needed for wheezing or shortness of breath. 01/30/17   Merrily Brittle, MD  benztropine (COGENTIN) 1 MG tablet Take 1 mg by mouth 3 (three) times daily.     [provider]  cetirizine (ZYRTEC) 10 MG tablet Take 10 mg by mouth daily.    [provider]  divalproex (DEPAKOTE) 500 MG DR tablet Take 1 tablet (500 mg total) by mouth every morning. 06/27/19 06/26/20  Darlin Priestly, MD  divalproex (DEPAKOTE) 500 MG DR tablet Take 2 tablets (1,000 mg total) by mouth at bedtime. 06/27/19   Darlin Priestly, MD  docusate sodium (COLACE) 100 MG capsule Take 100 mg by mouth daily.    [provider]  EPINEPHrine 0.3 mg/0.3 mL IJ SOAJ injection Inject 0.3 mg into the muscle as needed (allergic reactions).     [provider]  escitalopram (LEXAPRO) 20 MG tablet Take 20 mg by mouth daily.    [provider]  Fremanezumab-vfrm (AJOVY) 225 MG/1.5ML SOSY Inject into the skin every 28 (twenty-eight) days.    [provider]  furosemide (LASIX) 40 MG tablet  Take 1 tablet (40 mg total) by mouth daily for 30 days. 06/25/18 01/10/20  Minna Antis, MD  gabapentin (NEURONTIN) 600 MG tablet Take 600 mg by mouth 3 (three) times daily.    [provider]  gemfibrozil (LOPID) 600 MG tablet Take 600 mg by mouth 2 (two) times daily before a meal.    [provider]  ketoconazole (NIZORAL) 2 % cream Apply 1 application topically 2 (two) times daily.    [provider]  Lacosamide (VIMPAT) 100 MG TABS Take 100 mg by mouth 2 (two) times  daily.    [provider]  lidocaine (LIDODERM) 5 % Place 1 patch onto the skin daily. Remove & Discard patch within 12 hours or as directed by MD 04/17/20   Enid Derry, PA-C  linaclotide [facility]) 72 MCG capsule Take 72 mcg by mouth daily.     [provider]  lipase/protease/amylase (CREON) 36000 UNITS CPEP capsule Take 36,000 Units by mouth 3 (three) times daily with meals. 2 caps withmeals and 1 with snacks    [provider]  meclizine (ANTIVERT) 25 MG tablet Take 1 tablet (25 mg total) by mouth 3 (three) times daily as needed for dizziness. 04/05/20   Delton Prairie, MD  meloxicam (MOBIC) 15 MG tablet Take 1 tablet (15 mg total) by mouth daily as needed for pain. 06/27/19   Darlin Priestly, MD  omeprazole (PRILOSEC) 40 MG capsule Take 40 mg by mouth 2 (two) times daily before a meal. 04/16/19   [provider]  ondansetron (ZOFRAN) 8 MG tablet Take 8 mg by mouth every 8 (eight) hours as needed for nausea. 06/16/19   [provider]  Paliperidone Palmitate ER (INVEGA TRINZA) 819 MG/2.625ML SUSY Inject into the muscle every 3 (three) months.    [provider]  polyethylene glycol (MIRALAX / GLYCOLAX) packet Take 17 g by mouth daily.    [provider]  potassium chloride SA (KLOR-CON) 20 MEQ tablet Take 20 mEq by mouth daily.    [provider]  pravastatin (PRAVACHOL) 40 MG tablet Take 40 mg by mouth every evening. 06/24/19   [provider]  propranolol ER (INDERAL LA) 160 MG SR capsule Take 160 mg by mouth daily.    [provider]  QUEtiapine (SEROQUEL) 100 MG tablet Take 100 mg by mouth at bedtime.  06/24/19   [provider]  simethicone (MYLICON) 80 MG chewable tablet Chew 80 mg by mouth 2 (two) times daily.    [provider]  SUMAtriptan (IMITREX) 100 MG tablet Take 100 mg by mouth every 2 (two) hours as needed for migraine. May repeat in 2 hours if headache persists or recurs.    [provider]  Tiotropium Bromide-Olodaterol (STIOLTO RESPIMAT IN) Inhale 2 puffs into the lungs daily.    [provider]     Critical care time: 55 minutes

## 2020-11-26 NOTE — ED Notes (Addendum)
10 of Etomidate given by [employer]. Verbal from EDP Funke to dec dose to 10 since pt's Bps soft.

## 2020-11-26 NOTE — ED Notes (Signed)
Called and notified RT that VBG in lab on ice.

## 2020-11-26 NOTE — ED Notes (Signed)
Vent settings changed by RT and [facility] before [facility] left [facility] ED: changes made were a dec from 450 to 420 for Total Volume and FiO2 dec from 50% to 35%.

## 2020-11-26 NOTE — ED Notes (Signed)
OG tube adjusted per verbal from EDP Funke. Now at 55 mark at lip.

## 2020-11-26 NOTE — ED Notes (Signed)
This RN at bedside assisting teleneurologist to complete eval.

## 2020-11-26 NOTE — ED Notes (Signed)
RT at bedside adjusting vent and suctioning pt.

## 2020-11-26 NOTE — Sepsis Progress Note (Signed)
Code Sepsis discontinued, patient transferring from [facility] ED to [facility] 4N ICU for escalation of care.   Sunny Schlein Santana Gosdin DNP Pola Corn RN 2215 PM

## 2020-11-26 NOTE — ED Notes (Signed)
Ativan 2mg  given per verbal from EDP Funke.

## 2020-11-26 NOTE — ED Notes (Signed)
Lactic 2.8. Notified EDP Funke in person.

## 2020-11-26 NOTE — ED Notes (Signed)
Called RT to pull ABG b4 pt leaves. RT collected ABG.

## 2020-11-26 NOTE — Progress Notes (Signed)
[facility] following sepsis 

## 2020-11-26 NOTE — ED Notes (Signed)
More warm blankets placed on pt 

## 2020-11-26 NOTE — ED Notes (Signed)
Keppra received from pharm. Given to [employer].

## 2020-11-26 NOTE — ED Notes (Addendum)
Not able to secure consent for transfer from pt due to intubation and sedation. Attempted to call pt's contacts Russell Buchanan and Russell Buchanan at numbers listed in chart without answer from either individual to obtain consent for transfer.

## 2020-11-26 NOTE — Progress Notes (Addendum)
Pharmacy Antibiotic Note  Russell Buchanan is a 45 y.o. male with bipolar, schizophrenia, seizures who comes in with unresponsiveness on 11/26/2020. Pharmacy has been consulted for vancomycin, cefepime, and acyclovir dosing for meningitis. Current renal function indicates q24-36 hour dosing of vancomycin.   Plan: Acyclovir 10 mg/kg every 12 hours along with NS @ 125 ml/hr Cefepime 2 g IV q12h hours Vancomycin 2g x 1 loading dose. Will hold off on maintenance dose based on current renal function (Scr 2.21; baseline <1) and re-assess tomorrow. Will order random vanc level for tomorrow.     Temp (24hrs), Avg:102.9 F (39.4 C), Min:102.9 F (39.4 C), Max:102.9 F (39.4 C)  No results for input(s): WBC, CREATININE, LATICACIDVEN, VANCOTROUGH, VANCOPEAK, VANCORANDOM, GENTTROUGH, GENTPEAK, GENTRANDOM, TOBRATROUGH, TOBRAPEAK, TOBRARND, AMIKACINPEAK, AMIKACINTROU, AMIKACIN in the last 168 hours.  CrCl cannot be calculated (Patient's most recent lab result is older than the maximum 21 days allowed.).    Allergies  Allergen Reactions   Bee Venom Hives    Antimicrobials this admission: 6/25 Cefepime >>  6/25 Vancomycin >>  6/25 Acyclovir >>   Dose adjustments this admission:   Microbiology results: 6/25 BCx: sent  Thank you for allowing pharmacy to be a part of this patient's care.  Raiford Noble, PharmD 11/26/2020 5:13 PM

## 2020-11-26 NOTE — ED Notes (Signed)
This RN and Resp therapy accompanied pt to CT. Pt brought back to room.

## 2020-11-26 NOTE — ED Notes (Signed)
Sent a VBG to lab on ice. Notified RT and EDP.

## 2020-11-26 NOTE — ED Notes (Signed)
20 etomidate and 100 roc pulled by Seward Speck and checked by [employer]. Verbal from EDP Funke.

## 2020-11-26 NOTE — ED Notes (Signed)
Warm blanket placed on pt.

## 2020-11-26 NOTE — ED Notes (Signed)
Pt laying calmly in bed; eyes closed.

## 2020-11-26 NOTE — ED Notes (Signed)
Jamie at [facility] called to imitate tranfers for critical care

## 2020-11-26 NOTE — ED Notes (Signed)
Narcan 2 given per verbal from EDP Funke.

## 2020-11-26 NOTE — ED Notes (Signed)
Called resp therapy as MAP now steady enough for pt to go to CT. EDP Funke called CT to make sure a CT is reserved for pt's arrival.

## 2020-11-26 NOTE — Progress Notes (Signed)
CODE SEPSIS - PHARMACY COMMUNICATION  **Broad Spectrum Antibiotics should be administered within 1 hour of Sepsis diagnosis**  Time Code Sepsis Called/Page Received: 1707  Antibiotics Ordered: Cefepime 2g IV x1, Vancomycin 1g IV x1  Time of 1st antibiotic administration: 1718   Raiford Noble ,PharmD Clinical Pharmacist  11/26/2020  5:09 PM

## 2020-11-26 NOTE — ED Notes (Signed)
Chart checked for completion, see notes about signature for consent

## 2020-11-26 NOTE — ED Notes (Signed)
Report given to Wes with [facility]. States they're coming off exit to hospital now.

## 2020-11-26 NOTE — ED Notes (Addendum)
Pt accepted to [facility] 4 [place] room 23. Provider Vertis Kelch accepting. [phone].

## 2020-11-26 NOTE — ED Notes (Signed)
Roc 100 given now

## 2020-11-27 ENCOUNTER — Inpatient Hospital Stay (HOSPITAL_COMMUNITY): Payer: Medicaid Other

## 2020-11-27 DIAGNOSIS — R4182 Altered mental status, unspecified: Secondary | ICD-10-CM

## 2020-11-27 DIAGNOSIS — J9601 Acute respiratory failure with hypoxia: Secondary | ICD-10-CM

## 2020-11-27 DIAGNOSIS — I428 Other cardiomyopathies: Secondary | ICD-10-CM

## 2020-11-27 LAB — GLUCOSE, CAPILLARY
Glucose-Capillary: 191 mg/dL — ABNORMAL HIGH (ref 70–99)
Glucose-Capillary: 190 mg/dL — ABNORMAL HIGH (ref 70–99)
Glucose-Capillary: 136 mg/dL — ABNORMAL HIGH (ref 70–99)
Glucose-Capillary: 163 mg/dL — ABNORMAL HIGH (ref 70–99)
Glucose-Capillary: 156 mg/dL — ABNORMAL HIGH (ref 70–99)
Glucose-Capillary: 113 mg/dL — ABNORMAL HIGH (ref 70–99)

## 2020-11-27 LAB — CBC WITH DIFFERENTIAL/PLATELET
Abs Immature Granulocytes: 0.02 10*3/uL (ref 0.00–0.07)
Basophils Absolute: 0 10*3/uL (ref 0.0–0.1)
Basophils Relative: 0 %
Eosinophils Absolute: 0 10*3/uL (ref 0.0–0.5)
Eosinophils Relative: 0 %
HCT: 31.8 % — ABNORMAL LOW (ref 39.0–52.0)
Hemoglobin: 11.1 g/dL — ABNORMAL LOW (ref 13.0–17.0)
Immature Granulocytes: 0 %
Lymphocytes Relative: 23 %
Lymphs Abs: 1.8 10*3/uL (ref 0.7–4.0)
MCH: 31.9 pg (ref 26.0–34.0)
MCHC: 34.9 g/dL (ref 30.0–36.0)
MCV: 91.4 fL (ref 80.0–100.0)
Monocytes Absolute: 0.9 10*3/uL (ref 0.1–1.0)
Monocytes Relative: 12 %
Neutro Abs: 5 10*3/uL (ref 1.7–7.7)
Neutrophils Relative %: 65 %
Platelets: 124 10*3/uL — ABNORMAL LOW (ref 150–400)
RBC: 3.48 MIL/uL — ABNORMAL LOW (ref 4.22–5.81)
RDW: 13.5 % (ref 11.5–15.5)
WBC: 7.8 10*3/uL (ref 4.0–10.5)
nRBC: 0 % (ref 0.0–0.2)

## 2020-11-27 LAB — COMPREHENSIVE METABOLIC PANEL
ALT: 27 U/L (ref 0–44)
AST: 64 U/L — ABNORMAL HIGH (ref 15–41)
Albumin: 2.8 g/dL — ABNORMAL LOW (ref 3.5–5.0)
Alkaline Phosphatase: 78 U/L (ref 38–126)
Anion gap: 15 (ref 5–15)
BUN: 28 mg/dL — ABNORMAL HIGH (ref 6–20)
CO2: 20 mmol/L — ABNORMAL LOW (ref 22–32)
Calcium: 7.9 mg/dL — ABNORMAL LOW (ref 8.9–10.3)
Chloride: 104 mmol/L (ref 98–111)
Creatinine, Ser: 1.57 mg/dL — ABNORMAL HIGH (ref 0.61–1.24)
GFR, Estimated: 55 mL/min — ABNORMAL LOW (ref >60)
Glucose, Bld: 183 mg/dL — ABNORMAL HIGH (ref 70–99)
Potassium: 3.3 mmol/L — ABNORMAL LOW (ref 3.5–5.1)
Sodium: 139 mmol/L (ref 135–145)
Total Bilirubin: 1.4 mg/dL — ABNORMAL HIGH (ref 0.3–1.2)
Total Protein: 5.3 g/dL — ABNORMAL LOW (ref 6.5–8.1)

## 2020-11-27 LAB — POCT I-STAT 7, (LYTES, BLD GAS, ICA,H+H)
Acid-base deficit: 2 mmol/L (ref 0.0–2.0)
Bicarbonate: 22 mmol/L (ref 20.0–28.0)
Calcium, Ion: 1.11 mmol/L — ABNORMAL LOW (ref 1.15–1.40)
HCT: 30 % — ABNORMAL LOW (ref 39.0–52.0)
Hemoglobin: 10.2 g/dL — ABNORMAL LOW (ref 13.0–17.0)
O2 Saturation: 98 %
Patient temperature: 35.8
Potassium: 3.4 mmol/L — ABNORMAL LOW (ref 3.5–5.1)
Sodium: 139 mmol/L (ref 135–145)
TCO2: 23 mmol/L (ref 22–32)
pCO2 arterial: 30.8 mmHg — ABNORMAL LOW (ref 32.0–48.0)
pH, Arterial: 7.457 — ABNORMAL HIGH (ref 7.350–7.450)
pO2, Arterial: 86 mmHg (ref 83.0–108.0)

## 2020-11-27 LAB — BLOOD GAS, VENOUS
Acid-base deficit: 5.4 mmol/L — ABNORMAL HIGH (ref 0.0–2.0)
Bicarbonate: 18.6 mmol/L — ABNORMAL LOW (ref 20.0–28.0)
FIO2: 50
O2 Saturation: 98.4 %
Patient temperature: 37
pCO2, Ven: 31.2 mmHg — ABNORMAL LOW (ref 44.0–60.0)
pH, Ven: 7.394 (ref 7.250–7.430)

## 2020-11-27 LAB — ECHOCARDIOGRAM LIMITED
Height: 72 in
S' Lateral: 2.6 cm
Weight: 3008 oz

## 2020-11-27 LAB — TRIGLYCERIDES: Triglycerides: 77 mg/dL (ref <150)

## 2020-11-27 LAB — PROTIME-INR
INR: 1.2 (ref 0.8–1.2)
Prothrombin Time: 14.9 seconds (ref 11.4–15.2)

## 2020-11-27 LAB — MAGNESIUM: Magnesium: 1.8 mg/dL (ref 1.7–2.4)

## 2020-11-27 LAB — PHOSPHORUS: Phosphorus: 3.5 mg/dL (ref 2.5–4.6)

## 2020-11-27 LAB — BRAIN NATRIURETIC PEPTIDE: B Natriuretic Peptide: 95.8 pg/mL (ref 0.0–100.0)

## 2020-11-27 LAB — CK: Total CK: 1825 U/L — ABNORMAL HIGH (ref 49–397)

## 2020-11-27 LAB — HIV ANTIBODY (ROUTINE TESTING W REFLEX): HIV Screen 4th Generation wRfx: NONREACTIVE

## 2020-11-27 LAB — APTT: aPTT: 25 seconds (ref 24–36)

## 2020-11-27 LAB — TSH: TSH: 0.608 u[IU]/mL (ref 0.350–4.500)

## 2020-11-27 LAB — MRSA NEXT GEN BY PCR, NASAL: MRSA by PCR Next Gen: NOT DETECTED

## 2020-11-27 LAB — LACTIC ACID, PLASMA
Lactic Acid, Venous: 5.1 mmol/L (ref 0.5–1.9)
Lactic Acid, Venous: 5.6 mmol/L (ref 0.5–1.9)

## 2020-11-27 MED ORDER — GABAPENTIN 250 MG/5ML PO SOLN
600.0000 mg | Freq: Three times a day (TID) | ORAL | Status: DC
  Administered 2020-11-27 (×2): 600 mg
  Filled 2020-11-27 (×6): qty 12

## 2020-11-27 MED ORDER — VALPROATE SODIUM 100 MG/ML IV SOLN
500.0000 mg | Freq: Once | INTRAVENOUS | Status: AC
  Administered 2020-11-27: 500 mg via INTRAVENOUS
  Filled 2020-11-27: qty 5

## 2020-11-27 MED ORDER — MAGNESIUM SULFATE 2 GM/50ML IV SOLN
2.0000 g | Freq: Once | INTRAVENOUS | Status: AC
  Administered 2020-11-27: 2 g via INTRAVENOUS
  Filled 2020-11-27: qty 50

## 2020-11-27 MED ORDER — LACTATED RINGERS IV BOLUS
500.0000 mL | Freq: Once | INTRAVENOUS | Status: AC
  Administered 2020-11-27: 500 mL via INTRAVENOUS

## 2020-11-27 MED ORDER — FENTANYL CITRATE (PF) 100 MCG/2ML IJ SOLN
50.0000 ug | INTRAMUSCULAR | Status: DC | PRN
  Administered 2020-11-27: 50 ug via INTRAVENOUS
  Filled 2020-11-27: qty 2

## 2020-11-27 MED ORDER — POLYETHYLENE GLYCOL 3350 17 G PO PACK: 17.0000 g | PACK | Freq: Every day | ORAL | Status: DC | PRN

## 2020-11-27 MED ORDER — LACTATED RINGERS IV SOLN: INTRAVENOUS | Status: DC

## 2020-11-27 MED ORDER — EPINEPHRINE HCL 5 MG/250ML IV SOLN IN NS
0.5000 ug/min | INTRAVENOUS | Status: DC
  Administered 2020-11-27: 9 ug/min via INTRAVENOUS
  Administered 2020-11-27: 2 ug/min via INTRAVENOUS
  Filled 2020-11-27 (×2): qty 250

## 2020-11-27 MED ORDER — POTASSIUM CHLORIDE 20 MEQ PO PACK
20.0000 meq | PACK | ORAL | Status: AC
  Administered 2020-11-27 (×2): 20 meq
  Filled 2020-11-27 (×2): qty 1

## 2020-11-27 MED ORDER — SODIUM CHLORIDE 0.9 % IV SOLN
2.0000 g | Freq: Three times a day (TID) | INTRAVENOUS | Status: DC
  Administered 2020-11-27 – 2020-12-01 (×12): 2 g via INTRAVENOUS
  Filled 2020-11-27 (×12): qty 2

## 2020-11-27 MED ORDER — ORAL CARE MOUTH RINSE
15.0000 mL | Freq: Two times a day (BID) | OROMUCOSAL | Status: DC
  Administered 2020-11-27 – 2020-12-01 (×7): 15 mL via OROMUCOSAL

## 2020-11-27 MED ORDER — POTASSIUM CHLORIDE 10 MEQ/100ML IV SOLN
10.0000 meq | INTRAVENOUS | Status: AC
  Administered 2020-11-27 (×4): 10 meq via INTRAVENOUS
  Filled 2020-11-27 (×4): qty 100

## 2020-11-27 MED ORDER — SODIUM CHLORIDE 0.9 % IV SOLN
150.0000 mg | Freq: Two times a day (BID) | INTRAVENOUS | Status: DC
  Administered 2020-11-27 – 2020-12-01 (×9): 150 mg via INTRAVENOUS
  Filled 2020-11-27 (×11): qty 15

## 2020-11-27 MED ORDER — VALPROATE SODIUM 100 MG/ML IV SOLN
500.0000 mg | Freq: Three times a day (TID) | INTRAVENOUS | Status: DC
  Administered 2020-11-27 – 2020-12-01 (×12): 500 mg via INTRAVENOUS
  Filled 2020-11-27 (×17): qty 5

## 2020-11-27 MED ORDER — CHLORHEXIDINE GLUCONATE 0.12 % MT SOLN
15.0000 mL | Freq: Two times a day (BID) | OROMUCOSAL | Status: DC
  Administered 2020-11-27 – 2020-12-01 (×9): 15 mL via OROMUCOSAL
  Filled 2020-11-27 (×7): qty 15

## 2020-11-27 MED ORDER — VANCOMYCIN VARIABLE DOSE PER UNSTABLE RENAL FUNCTION (PHARMACIST DOSING): Status: DC

## 2020-11-27 MED ORDER — MIDAZOLAM HCL 2 MG/2ML IJ SOLN
2.0000 mg | Freq: Once | INTRAMUSCULAR | Status: AC
  Administered 2020-11-27: 2 mg via INTRAVENOUS

## 2020-11-27 MED ORDER — VALPROATE SODIUM 100 MG/ML IV SOLN
250.0000 mg | Freq: Four times a day (QID) | INTRAVENOUS | Status: DC
  Filled 2020-11-27: qty 2.5

## 2020-11-27 MED ORDER — HYDROCORTISONE NA SUCCINATE PF 100 MG IJ SOLR
50.0000 mg | Freq: Four times a day (QID) | INTRAMUSCULAR | Status: DC
  Administered 2020-11-27 – 2020-11-28 (×5): 50 mg via INTRAVENOUS
  Filled 2020-11-27 (×5): qty 2

## 2020-11-27 MED ORDER — SODIUM CHLORIDE 0.9 % IV BOLUS
1000.0000 mL | Freq: Once | INTRAVENOUS | Status: AC
  Administered 2020-11-27: 1000 mL via INTRAVENOUS

## 2020-11-27 MED ORDER — DOCUSATE SODIUM 50 MG/5ML PO LIQD: 100.0000 mg | Freq: Two times a day (BID) | ORAL | Status: DC | PRN

## 2020-11-27 MED ORDER — MIDAZOLAM HCL 2 MG/2ML IJ SOLN
INTRAMUSCULAR | Status: AC
  Filled 2020-11-27: qty 2

## 2020-11-27 MED ORDER — VANCOMYCIN HCL IN DEXTROSE 1-5 GM/200ML-% IV SOLN
1000.0000 mg | Freq: Two times a day (BID) | INTRAVENOUS | Status: DC
  Administered 2020-11-27 (×2): 1000 mg via INTRAVENOUS
  Filled 2020-11-27 (×2): qty 200

## 2020-11-27 MED ORDER — SODIUM CHLORIDE 0.9 % IV SOLN
2.0000 g | Freq: Two times a day (BID) | INTRAVENOUS | Status: DC
  Administered 2020-11-27: 2 g via INTRAVENOUS
  Filled 2020-11-27: qty 2

## 2020-11-27 MED ORDER — VASOPRESSIN 20 UNITS/100 ML INFUSION FOR SHOCK
0.0000 [IU]/min | INTRAVENOUS | Status: DC
  Administered 2020-11-27: 0.03 [IU]/min via INTRAVENOUS
  Filled 2020-11-27 (×3): qty 100

## 2020-11-27 MED ORDER — VALPROATE SODIUM 100 MG/ML IV SOLN
250.0000 mg | Freq: Once | INTRAVENOUS | Status: DC
  Filled 2020-11-27: qty 2.5

## 2020-11-27 NOTE — Procedures (Signed)
Central Venous Catheter Insertion Procedure Note  Russell Buchanan  [id number]  February 14, 1976  Date:11/27/20  Time:6:37 AM   Provider Performing:Abbagale Goguen E Mikki Harbor   Procedure: Insertion of Non-tunneled Central Venous Catheter(36556) with US guidance (96295)   Indication(s) Medication administration  Consent Unable to obtain consent due to emergent nature of procedure. Attempted to call [name]. No answer x 2  Anesthesia Fentanyl  Timeout Verified patient identification, verified procedure, site/side was marked, verified correct patient position, special equipment/implants available, medications/allergies/relevant history reviewed, required imaging and test results available.  Sterile Technique Maximal sterile technique including full sterile barrier drape, hand hygiene, sterile gown, sterile gloves, mask, hair covering, sterile ultrasound probe cover (if used).  Procedure Description Area of catheter insertion was cleaned with chlorhexidine and draped in sterile fashion.  With real-time ultrasound guidance a central venous catheter was placed into the right internal jugular vein. Nonpulsatile blood flow and easy flushing noted in all ports.  The catheter was sutured in place and sterile dressing applied.  Complications/Tolerance None; patient tolerated the procedure well. Chest X-ray is ordered to verify placement for internal jugular or subclavian cannulation.   Chest x-ray is not ordered for femoral cannulation.  EBL Minimal  Specimen(s) None

## 2020-11-27 NOTE — Procedures (Addendum)
Patient Name: Russell Buchanan  MRN: [id number]  Epilepsy Attending: Charlsie Quest  Referring Physician/Provider: Dr Ritta Slot Duration: 11/27/2020 1231 to 11/27/2020 1213  Patient history: 45 yo M presenting with AMS after LOC with gaze deviation(which is reportedly a typical seizure) consistent with status epilepticus. EEG to evaluate for seizure  Level of alertness:  lethargic   AEDs during EEG study: LEV, VPA, LCM, Gabapentin  Technical aspects:  This EEG was obtained using a 10 lead EEG system positioned circumferentially without any parasagittal coverage (rapid EEG). Computer selected EEG is reviewed as well as background features and all clinically significant events.  Description: No posterior dominant rhythm was seen. EEG showed continuous generalized 3 to 6 Hz theta-delta slowing. Hyperventilation and photic stimulation were not performed.     Parts of study were technically difficult due to high impedence and electrode artifact.  ABNORMALITY - Continuous slow, generalized  IMPRESSION: This study is suggestive of moderate diffuse encephalopathy, nonspecific etiology. No seizures or epileptiform discharges were seen throughout the recording.  Russell Buchanan

## 2020-11-27 NOTE — Plan of Care (Signed)
Brief neurology note  Came by to check on patient. He is awake, can tell NP his name, follows simple commands, recognizes a thumb and watch. Protrudes his tongue on command.   EEG: This study is suggestive of moderate diffuse encephalopathy, nonspecific etiology. No seizures or epileptiform discharges were seen throughout the recording.   Continue AEDs and call should there be any further seizure activity.  VA 500mg  tid, Gabapentin 600mg  tid, and Lacosamide 150mg  bid. His improved mental status is encouraging and MRI brain would likely be low yield at this time.   Neurology will be available for questions as needed.   , MSN, APN-BC Neurology NP   NO CHARGE NOTE

## 2020-11-27 NOTE — Progress Notes (Addendum)
[facility] Physician-Brief Progress Note Patient Name: Cullin Dishman DOB: May 10, 1976 MRN: [id number]   Date of Service  11/27/2020  HPI/Events of Note  LA going up.s/p 4 lit total fluids so far. 2 3 from [facility] ED.Marland Kitchen  EEG no seizures spike. No fever. On epinephrine gtt. Off of propofol, no sedation . Improving AKI. EKG sinus. Qtc 480. HR 63 TSH normal. Wbc normal. CT abd: neg.  Discussed with CCM MD.   eICU Interventions  - 1 lit NS bolus. On 50% fio2.follow LA at 5.30 AM. Follow VBG once.  On vanc/cefipime.      Intervention Category Intermediate Interventions: Hypotension - evaluation and management  Ranee Gosselin 11/27/2020, 3:46 AM  5:25 AM LA still going up 5.7 Discussed with CCM, MD. Start vaso and wean epi down. Ortho consult to consider for clavicular #.

## 2020-11-27 NOTE — Progress Notes (Signed)
Prelim EEG report: No ongoing seizure activity.  Ritta Slot, MD [facility] [phone]  If 7pm- 7am, please page neurology on call as listed in [employer].

## 2020-11-27 NOTE — Progress Notes (Signed)
AM K+ 3.3 with creat 1.57 and GFR 55. [facility] CCM Electrolyte protocol initiated.

## 2020-11-27 NOTE — Progress Notes (Signed)
Pharmacy Antibiotic Note  Adama Ferber is a 45 y.o. male admitted on 11/26/2020 with sepsis.  Pharmacy has been consulted for Vanco, Cefepime dosing.   ID: Septic shock. LA 5.6 rising. Scr 2.21>1.57, PC WNL, WBC WNL  Acyclovir 6/25>>6/25 Cefepime 6/25>> Vanco 6/25>>  Plan: Increase Cefepime 2 g IV q8 hours Vancomycin 1g IV q12 hrs today per MD nomogram dosing. Hopefully Scr will improve to baseline by tomorrow to perform AUC calculations.      Temp (24hrs), Avg:98.3 F (36.8 C), Min:96.08 F (35.6 C), Max:102.9 F (39.4 C)  Recent Labs  Lab 11/26/20 1651 11/26/20 1654 11/26/20 1731 11/27/20 0221 11/27/20 0417  WBC  --  5.0  --  7.8  --   CREATININE  --  2.21*  --  1.57*  --   LATICACIDVEN 2.8*  --  3.7* 5.1* 5.6*    Estimated Creatinine Clearance: 65.9 mL/min (A) (by C-G formula based on SCr of 1.57 mg/dL (H)).    Allergies  Allergen Reactions   Bee Venom Hives    Aurea Aronov S. Merilynn Finland, PharmD, BCPS Clinical Staff Pharmacist [email or web address]  Pasty Spillers 11/27/2020 9:35 AM

## 2020-11-27 NOTE — Progress Notes (Signed)
200 Fentanyl waste and 35 Versed waste post discontinuation. Witnessed by Ammie Ferrier.  Benson Norway, Charity fundraiser.

## 2020-11-27 NOTE — Procedures (Signed)
Extubation Procedure Note  Patient Details:   Name: Russell Buchanan DOB: Aug 07, 1975 MRN: [id number]   Airway Documentation:  Airway 8 mm (Active)   Vent end date: 11/27/20 Vent end time: 1000   Evaluation  O2 sats: stable throughout Complications: No apparent complications Patient did tolerate procedure well. Bilateral Breath Sounds: Clear, Diminished   Yes  Positive cuff leak prior to extubation. Per MD use bipap with a backup rate for hx of central sleep apnea.  Dewain Penning T 11/27/2020, 10:22 AM

## 2020-11-27 NOTE — Progress Notes (Signed)
  Echocardiogram 2D Echocardiogram has been performed.  Russell Buchanan F 11/27/2020, 4:40 PM

## 2020-11-27 NOTE — Progress Notes (Signed)
eLink Physician-Brief Progress Note Patient Name: Russell Buchanan DOB: 1975/10/22 MRN: [id number]   Date of Service  11/27/2020  HPI/Events of Note  HR 40, on epi at 10.  Camera: Off of propofol. No active sz's. Sinus brady at 42,  Earlier ekg from 2021: HR was 62. Sinus brady is not new.  MAP > 70 on epi.  eICU Interventions  - get TSH -get EKG for qtc interval watch. Tricyclic + on tox.  - earlier ABG 30 mins ago Hco3 at 22 improving. Resp alkalosis.  - follow LA.       Intervention Category Intermediate Interventions: Arrhythmia - evaluation and management  Ranee Gosselin 11/27/2020, 2:00 AM

## 2020-11-27 NOTE — Progress Notes (Signed)
NEUROLOGY CONSULTATION PROGRESS NOTE   Date of service: November 27, 2020 Patient Name: Russell Buchanan MRN:  [id number] DOB:  1975-12-21  Brief HPI  Russell Buchanan is a 45 y.o. male with a history of seizures who presents with altered mental status after seizure earlier today.  He was in a group home and was seen to fall.  He had a left gaze deviation and he was placed back in bed.  Usually he comes around after a few minutes, but with this episode he did not and therefore EMS was called. He had no antecedent symptoms.   On arrival to [place] he was treated with benzodiazepine and intubated.   He was febrile with an elevated lactate and CK.  Procalcitonin was negative.   Interval Hx   Awake, alert, extubated. Smiles, oriented and follows 1 step commands.  Vitals   Vitals:   11/27/20 1400 11/27/20 1415 11/27/20 1430 11/27/20 1445  BP: 111/64 119/66 (!) 99/56 119/66  Pulse: (!) 190 (!) 217 69 (!) 59  Resp: 12 11 20  (!) 23  Temp: 98.6 F (37 C) 98.6 F (37 C) 98.6 F (37 C) 98.6 F (37 C)  TempSrc:      SpO2: 98% 100% (!) 89% 99%     There is no height or weight on file to calculate BMI.  Physical Exam   General: Laying comfortably in bed; in no acute distress.  HENT: Normal oropharynx and mucosa. Normal external appearance of ears and nose.  Neck: Supple, no pain or tenderness  CV: No JVD. No peripheral edema.  Pulmonary: Symmetric Chest rise. Normal respiratory effort.  Abdomen: Soft to touch, non-tender.  Ext: No cyanosis, edema, or deformity  Skin: No rash. Normal palpation of skin.  Musculoskeletal: Normal digits and nails by inspection. No clubbing.  Neurologic Examination  Mental status/Cognition: Alert, oriented to self only. Poor attention. Speech/language: Fluent, comprehension intact to simple commands. Cranial nerves:   CN II Pupils equal and reactive to light, no VF deficit.   CN III,IV,VI EOM intact, no gaze preference or deviation, no nystagmus   CN V  normal sensation in V1, V2, and V3 segments bilaterally   CN VII no asymmetry, no nasolabial fold flattening   CN VIII normal hearing to speech   CN IX & X normal palatal elevation, no uvular deviation   CN XI    CN XII midline tongue protrusion   Motor:  Muscle bulk: normal, tone increased. Mvmt Root Nerve  Muscle Right Left Comments  SA C5/6 Ax Deltoid     EF C5/6 Mc Biceps 5 5   EE C6/7/8 Rad Triceps 5 5   WF C6/7 Med FCR     WE C7/8 PIN ECU     F Ab C8/T1 U ADM/FDI 5 5   HF L1/2/3 Fem Illopsoas 5 5   KE L2/3/4 Fem Quad     DF L4/5 D Peron Tib Ant 5 5   PF S1/2 Tibial Grc/Sol 5 5    Sensation:  Light touch Grossly intact throughout   Pin prick    Temperature    Vibration   Proprioception    Coordination/Complex Motor:   Labs   Basic Metabolic Panel:  Lab Results  Component Value Date   NA 139 11/27/2020   K 3.3 (L) 11/27/2020   CO2 20 (L) 11/27/2020   GLUCOSE 183 (H) 11/27/2020   BUN 28 (H) 11/27/2020   CREATININE 1.57 (H) 11/27/2020   CALCIUM 7.9 (L) 11/27/2020   GFRNONAA  55 (L) 11/27/2020   GFRAA >60 03/05/2020   HbA1c: No results found for: HGBA1C LDL:  Lab Results  Component Value Date   LDLCALC 126 (H) 03/27/2012   Urine Drug Screen:     Component Value Date/Time   LABOPIA NONE DETECTED 11/26/2020 1731   COCAINSCRNUR NONE DETECTED 11/26/2020 1731   LABBENZ NONE DETECTED 11/26/2020 1731   AMPHETMU NONE DETECTED 11/26/2020 1731   THCU NONE DETECTED 11/26/2020 1731   LABBARB NONE DETECTED 11/26/2020 1731    Alcohol Level     Component Value Date/Time   ETH <10 06/08/2020 1000   Lab Results  Component Value Date   LEVETIRACETA <1.0 (L) 08/02/2019   Lab Results  Component Value Date   VALPROATE 70 11/26/2020    Imaging and Diagnostic studies   CT Head Wo Contrast  1. No acute intracranial abnormality. 2. Bilateral maxillary sinus and bilateral ethmoid sinus disease.  Impression   Russell Buchanan is a 45 y.o. male presenting with  LOC + Gaze deviations consistent with status epilepticus. Ceribell EEG overnight was negative for seizures. He is extubated and following commands with no focal deficit.  I suspect that his left leg cellulitis probably lowered his seizure threshold.  Recommendations  1) continue home valproic acid 500 3 times daily 2) continue home dose of gabapentin 600 3 times daily 3) increase home lacosamide to 150 twice daily 4) no further seizures on LTM, discontinued this AM. 5) No further neurological workup at this time. He is pretty close to his baseline now. 6) Neurology inpatient team will signoff. Please feel free to contact us with any questions or concerns ______________________________________________________________________   Thank you for the opportunity to take part in the care of this patient. If you have any further questions, please contact the neurology consultation attending.  Signed,  Erick Blinks [facility] Pager Number [phone]

## 2020-11-27 NOTE — Consult Note (Signed)
Neurology Consultation Reason for Consult: Concern for status epilepticus Referring Physician: Izquierda, M  CC: Seizures  History is obtained from: Chart review  HPI: Russell Buchanan is a 45 y.o. male with a history of seizures who presents with altered mental status after seizure earlier today.  He was in a group home and was seen to fall.  He had a left gaze deviation and he was placed back in bed.  Usually he comes around after a few minutes, but with this episode he did not and therefore EMS was called. He had no antecedent symptoms.  On arrival to [place] he was treated with benzodiazepine and intubated.  He was febrile with an elevated lactate and CK.  Procalcitonin was negative.  ROS: Unable to obtain due to altered mental status.   Past Medical History:  Diagnosis Date   Antisocial personality disorder (HCC)    Bipolar disorder (HCC)    Environmental and seasonal allergies    GERD (gastroesophageal reflux disease)    Headache    Hyperlipidemia    Infected elbow (HCC)    Schizophrenia (HCC)    Seizures (HCC)    Tremors of nervous system      Family History  Family history unknown: Yes     Social History:  reports that he has been smoking cigarettes. He has a 3.50 pack-year smoking history. He has quit using smokeless tobacco.  His smokeless tobacco use included chew and snuff. He reports previous drug use. He reports that he does not drink alcohol.   Exam: Current vital signs: BP 107/63   Pulse (!) 45   Temp (!) 96.08 F (35.6 C)   Resp 14   SpO2 100%  Vital signs in last 24 hours: Temp:  [96.08 F (35.6 C)-102.9 F (39.4 C)] 96.08 F (35.6 C) (06/26 0200) Pulse Rate:  [45-95] 45 (06/26 0200) Resp:  [10-26] 14 (06/26 0200) BP: (68-117)/(34-75) 107/63 (06/26 0200) SpO2:  [95 %-100 %] 100 % (06/26 0200) FiO2 (%):  [50 %] 50 % (06/25 2301) Weight:  [85.3 kg] 85.3 kg (06/25 1749)   Physical Exam  Constitutional: Appears well-developed and  well-nourished.  Psych: Affect appropriate to situation Eyes: No scleral injection HENT: ET tube in place MSK: no joint deformities.  Cardiovascular: Normal rate and regular rhythm.  Respiratory: Effort normal, non-labored breathing GI: Soft.  No distension. There is no tenderness.  Skin: Erythema over the left lower extremity.   Neuro: Mental Status: Patient opens eyes to neck stimulation, he appears to fixate and track me at one point, but does not do so reliably.  He does not follow commands. Cranial Nerves: II: Does not blink to threat. Pupils are equal, round, and reactive to light.   III,IV, VI: He crosses midline in both directions, slightly disconjugate V: VII: Blinks to eyelid stimulation bilaterally Motor: He withdraws mild stimulation extremities, relatively symmetrically Sensory: As above Cerebellar: Does not perform   I have reviewed labs in epic and the results pertinent to this consultation are: Na 139 Corrected Calcium 8.9 Mg 1.8 Cr 1.57   I have reviewed the images obtained: CT head - negaitve  Impression: 45 yo M presenting with AMS after LOC with gaze deviation(which is reportedly a typical seizure) consistent with status epilepticus. A STAT ceribell EEG does not reveal any ongoing seizure.  He has an area of erythema on his left leg, and I wonder if cellulitis is lowering his seizure threshold.  With no antecedent symptoms, I think that the risk of  encephalitis or meningitis is exceedingly low.  Also, with no focal findings on exam, I would favor continuing EEG monitoring rather than interrupting for CTA at this time.  Recommendations: 1) continue home valproic acid 500 3 times daily 2) continue home dose of gabapentin 600 3 times daily 3) increase home lacosamide to 150 twice daily 4) continue to monitor overnight, and wean sedation.  If no further seizures, can discontinue LTM EEG tomorrow. 5) if he continues to improve mental status, no further work-up  needed, if he continues to be encephalopathic would consider MRI brain.   Ritta Slot, MD [facility] [phone]  If 7pm- 7am, please page neurology on call as listed in AMION.

## 2020-11-27 NOTE — Progress Notes (Signed)
NAME:  Russell Buchanan, MRN:  [id number], DOB:  10/14/1975, LOS: 1 ADMISSION DATE:  11/26/2020, CONSULTATION DATE:  11/26/20 REFERRING MD:  Dr. Fuller Plan, CHIEF COMPLAINT:  Altered Mental status   History of Present Illness:  This is a 45 yo male with history of schizophrenia and bipolar disease, seizure disorder, migraines, HLD, GERD who presents for altered mentation. Lives at group home. While there he was noted to fall while walking outside. Was noted ot have a left gaze deviation with this. It than became right sided gaze deviation. In Ed was noted to continue to be non responsive. Was given Narcan with no improvement. Was also given 2mg  of ativan in case patient was was having non convulsive SE. No improvement after ativan administration. Patient was intubated for airway protection. Critical care consulted for admission.   In the ED UDS positive for tricyclics. UA not concerning for infection. LA elevated at 3.7. CK elevated at 609. Troponin normal x 2. Tylenol and Salicylate levels negative. Procalcitonin negative. Valproic avcid level therapeutic at 70. Covid testing negative. Ammonia pending.  Febrile 102.9 with erythema/ swelling of LLE with open sores.   On speaking with his caretaker at [facility] the patient often has episodes where he will pass out just like this. Usually it seems that he comes back around in a few minutes. They will put him in his bed and he will recover. He had one of these epsiodes yesterday. The difference between yesterday and today is that he did not improve on his own after a few minutes so they called EMS.  He has been hospitalized for episodes like this in the past as well.   Pertinent  Medical History  schizophrenia  bipolar disease seizure disorder,  Migraines HLD GERD   Significant Hospital Events: Including procedures, antibiotic start and stop dates in addition to other pertinent events   Tx from [facility] to [facility], intubated on pressors, admitted  to ICU on 11/26/20, intubated, cEEG   6/26 R IJ CVL >>  6/25 [facility]) >> 6/26 Trach asp >>  6/25 vanc >> 6/25 cefepime >> 6/25 acyclovir   Interim History / Subjective:  Remains on epi gtt at 10 mcg/min and vasopressin Weaning on PSV, f/c Ongoing cEEG  Objective   Blood pressure 104/74, pulse 70, temperature 99.14 F (37.3 C), resp. rate 16, SpO2 100 %.    Vent Mode: PRVC FiO2 (%):  [30 %-50 %] 30 % Set Rate:  [14 bmp-16 bmp] 14 bmp Vt Set:  [500 mL-620 mL] 620 mL PEEP:  [5 cmH20] 5 cmH20 Plateau Pressure:  [12 cmH20] 12 cmH20   Intake/Output Summary (Last 24 hours) at 11/27/2020 0950 Last data filed at 11/27/2020 0600 Gross per 24 hour  Intake 305.3 ml  Output 1600 ml  Net -1294.7 ml   There were no vitals filed for this visit.  Examination: General:  Adult male on MV in NAD HEENT: MM pink/moist, ETT/ OGT, pupils 4/reactive, EEG in place Neuro: Awake, f/c, MAE CV: SR, no murmur PULM:  non labored, CTA, gagging on ETT, on PSV GI: soft, bs +, NT Extremities: warm/dry, no LE edema, RLE with erythema, marked outer borders- see pic     Labs/imaging that I have personally reviewed  (right click and "Reselect all SmartList Selections" daily)  BMET, BNP 95, LA 5.1-> 5.6  CT chest abdomen pelvis from 11/26/20 >> 1. Acute fracture of the distal left clavicle. 2. Chronic L4 vertebral body left transverse process fracture with multiple  chronic bilateral rib fractures. 3. Orogastric tube positioning, as described above. Further advancement of the OG tube by approximately 6 cm is recommended to further decrease the risk of aspiration. 4. No acute or active process within the abdomen or pelvis.  CT cervical 11/26/20 IMPRESSION: No acute cervical spine fracture or subluxation.  CT head 11/26/20 IMPRESSION: 1. No acute intracranial abnormality. 2. Bilateral maxillary sinus and bilateral ethmoid sinus disease.  CT left lower extremity: IMPRESSION: 1. Mild  circumferential soft tissue swelling of the lower leg, nonspecific. 2. No acute osseous abnormality. 3. Old bimalleolar fractures.   Resolved Hospital Problem list   NA  Assessment & Plan:  This is a 45 yo male with history as noted above who presents for altered mentation. There is concern for Non convulsive SE vs mengintis.  Encephalopathy-Could be metabolic from sepsis from LLE wound vs from postictal state vs non convulsive SE. With history of uncontrolled seizures and recently having one day prior this seems likely secondary to uncontrolled seizure diosrder rather than meningitis. No leukocytosis. Did not have HA today nor any fever per his cartakers. -EEG without evidence of seizures thus far - continue abx as below  - Appreciate Neurology input - AEDs per neurology,  depakote 1000 QHS, -Gabapentin 600 TID, and Vimpat 100 BID-> increased to 150mg  BID, keppra 500mg  BID - cEEG for now - Maintain neuro protective measures; goal for eurothermia, euglycemia, eunatermia, normoxia, and PCO2 goal of 35-40 - Seizure precautions  - Aspirations precautions   Acute respiratory insufficiency related to above - good TV on PSV, extubate now - some apnea, ? Sleep apnea- BiPAP prn - SLP  - aggressive pulm hygiene  Shock- Likely related to sepsis vs sedation. Source would likely include LLE cellulits. -TTE pending - follow culture data (some from [facility]) - continue vanc/ cefepime for now - trend WBC/ fever curve - continue stress dose steroids  - Epi gtt (for bradycardia) and vasopressin wean for MAP goal > 65 - adding MIVF 100 ml/hr  AKI-creatinine elevated at 2.2 from baseline of 0.81 Patient with mild CK leak as well - Gentle hydration with MIVF  - sCr improving, UOP 1.8L thus far -Trend CK 609->  - Trend BMP / urinary output - Replace electrolytes as indicated - Avoid nephrotoxic agents, ensure adequate renal perfusion  Bipolar schizophrenia- -Hold seroquel and lexapro for now,  NPO  Multiple fractures-Acute fracture of left clavicle and chronic rib fractures -Consider ortho consult, currently denies left clavicle pain   Migraine disorder- - Hold home medicaitons including propranolol for now, given prior bradycardia  Best Practice (right click and "Reselect all SmartList Selections" daily)   Diet/type: NPO Pain/Anxiety/Delirium protocol RASS goal: 0/ d/c VAP protocol (if indicated): Yes- d/c DVT prophylaxis: prophylactic heparin  GI prophylaxis: H2B Glucose control:  not indicated Central venous access:  Yes, and it is still needed given ongoing pressor requirements Arterial line:  N/A Foley:  removal ordered  Mobility:  bed rest  PT consulted: Yes Studies pending: EEG Culture data pending:sputum and blood Last reviewed culture data:today Antibiotics:CTX, Vanc Antibiotic de-escalation: no,  continue current rx Stop date: to be determined  Code Status:  full code  - 6/25 He does not have DNR at facility that caretaker is aware of. Attempted to call [facility] who is the administrator for further details on code status and potential procedures. No answer x2 - pending update 6/26 Last date of multidisciplinary goals of care discussion [NA] ccm prognosis: Serious Disposition: remains critically ill,  will stay in intensive care   Labs   CBC: Recent Labs  Lab 11/26/20 1654 11/27/20 0115 11/27/20 0221  WBC 5.0  --  7.8  NEUTROABS 2.1  --  5.0  HGB 11.7* 10.2* 11.1*  HCT 33.5* 30.0* 31.8*  MCV 91.5  --  91.4  PLT 131*  --  124*    Basic Metabolic Panel: Recent Labs  Lab 11/26/20 1654 11/27/20 0115 11/27/20 0221  NA 136 139 139  K 4.8 3.4* 3.3*  CL 102  --  104  CO2 22  --  20*  GLUCOSE 95  --  183*  BUN 40*  --  28*  CREATININE 2.21*  --  1.57*  CALCIUM 8.6*  --  7.9*  MG  --   --  1.8  PHOS  --   --  3.5   GFR: Estimated Creatinine Clearance: 65.9 mL/min (A) (by C-G formula based on SCr of 1.57 mg/dL (H)). Recent Labs  Lab  11/26/20 1651 11/26/20 1654 11/26/20 1731 11/27/20 0221 11/27/20 0417  PROCALCITON  --  <0.10  --   --   --   WBC  --  5.0  --  7.8  --   LATICACIDVEN 2.8*  --  3.7* 5.1* 5.6*    Liver Function Tests: Recent Labs  Lab 11/26/20 1654 11/27/20 0221  AST 51* 64*  ALT 22 27  ALKPHOS 92 78  BILITOT 1.3* 1.4*  PROT 6.2* 5.3*  ALBUMIN 3.2* 2.8*   No results for input(s): LIPASE, AMYLASE in the last 168 hours. No results for input(s): AMMONIA in the last 168 hours.  ABG    Component Value Date/Time   PHART 7.457 (H) 11/27/2020 0115   PCO2ART 30.8 (L) 11/27/2020 0115   PO2ART 86 11/27/2020 0115   HCO3 18.6 (L) 11/27/2020 0730   TCO2 23 11/27/2020 0115   ACIDBASEDEF 5.4 (H) 11/27/2020 0730   O2SAT 98.4 11/27/2020 0730     Coagulation Profile: Recent Labs  Lab 11/27/20 0221  INR 1.2    Cardiac Enzymes: Recent Labs  Lab 11/26/20 1714  CKTOTAL 609*    HbA1C: No results found for: HGBA1C  CBG: Recent Labs  Lab 11/26/20 1645 11/27/20 0131 11/27/20 0328 11/27/20 0759  GLUCAP 95 191* 190* 136*    Critical care time:  35 minutes       Posey Boyer, ACNP [facility] 11/27/2020, 9:51 AM

## 2020-11-27 NOTE — Progress Notes (Addendum)
eLink Physician-Brief Progress Note Patient Name: Daud Cayer DOB: 02-Aug-1975 MRN: [id number]   Date of Service  11/27/2020  HPI/Events of Note  Pt now waking up and biting ETT. Propofol off d/t bradycardia. Fentanyl and versed D/Cd earlier. HR now NSR 70-80, requesting sedation IVPs.  eICU Interventions  Fenta 50 mcg prn q2 hr for now. Avoiding benzos, no obvious non convulsive spikes on EEG. SAT-SBT daily.  Discussed with RN.      Intervention Category Intermediate Interventions: Other:  Ranee Gosselin 11/27/2020, 4:32 AM

## 2020-11-28 ENCOUNTER — Inpatient Hospital Stay (HOSPITAL_COMMUNITY): Payer: Medicaid Other

## 2020-11-28 DIAGNOSIS — R609 Edema, unspecified: Secondary | ICD-10-CM

## 2020-11-28 DIAGNOSIS — L538 Other specified erythematous conditions: Secondary | ICD-10-CM

## 2020-11-28 DIAGNOSIS — G40901 Epilepsy, unspecified, not intractable, with status epilepticus: Secondary | ICD-10-CM

## 2020-11-28 DIAGNOSIS — S42009A Fracture of unspecified part of unspecified clavicle, initial encounter for closed fracture: Secondary | ICD-10-CM

## 2020-11-28 LAB — BASIC METABOLIC PANEL
Anion gap: 8 (ref 5–15)
BUN: 12 mg/dL (ref 6–20)
CO2: 25 mmol/L (ref 22–32)
Calcium: 8.2 mg/dL — ABNORMAL LOW (ref 8.9–10.3)
Chloride: 106 mmol/L (ref 98–111)
Creatinine, Ser: 0.77 mg/dL (ref 0.61–1.24)
GFR, Estimated: >60 mL/min (ref >60)
Glucose, Bld: 108 mg/dL — ABNORMAL HIGH (ref 70–99)
Potassium: 4.1 mmol/L (ref 3.5–5.1)
Sodium: 139 mmol/L (ref 135–145)

## 2020-11-28 LAB — CBC
HCT: 28 % — ABNORMAL LOW (ref 39.0–52.0)
Hemoglobin: 9.5 g/dL — ABNORMAL LOW (ref 13.0–17.0)
MCH: 31.9 pg (ref 26.0–34.0)
MCHC: 33.9 g/dL (ref 30.0–36.0)
MCV: 94 fL (ref 80.0–100.0)
Platelets: DECREASED 10*3/uL (ref 150–400)
RBC: 2.98 MIL/uL — ABNORMAL LOW (ref 4.22–5.81)
RDW: 14.1 % (ref 11.5–15.5)
WBC: 5.9 10*3/uL (ref 4.0–10.5)
nRBC: 0 % (ref 0.0–0.2)

## 2020-11-28 LAB — CK: Total CK: 884 U/L — ABNORMAL HIGH (ref 49–397)

## 2020-11-28 MED ORDER — VANCOMYCIN HCL 1500 MG/300ML IV SOLN
1500.0000 mg | Freq: Two times a day (BID) | INTRAVENOUS | Status: DC
  Administered 2020-11-28: 1500 mg via INTRAVENOUS
  Filled 2020-11-28: qty 300

## 2020-11-28 MED ORDER — GABAPENTIN 250 MG/5ML PO SOLN
600.0000 mg | Freq: Three times a day (TID) | ORAL | Status: DC
  Administered 2020-11-28 – 2020-12-01 (×9): 600 mg via ORAL
  Filled 2020-11-28 (×13): qty 12

## 2020-11-28 MED ORDER — ESCITALOPRAM OXALATE 10 MG PO TABS
20.0000 mg | ORAL_TABLET | Freq: Every day | ORAL | Status: DC
  Filled 2020-11-28: qty 2

## 2020-11-28 MED ORDER — DOCUSATE SODIUM 50 MG/5ML PO LIQD: 100.0000 mg | Freq: Two times a day (BID) | ORAL | Status: DC | PRN

## 2020-11-28 MED ORDER — ARIPIPRAZOLE 5 MG PO TABS
5.0000 mg | ORAL_TABLET | Freq: Every day | ORAL | Status: DC
  Filled 2020-11-28: qty 1

## 2020-11-28 MED ORDER — ARIPIPRAZOLE 10 MG PO TABS
5.0000 mg | ORAL_TABLET | Freq: Every day | ORAL | Status: DC
  Administered 2020-11-28 – 2020-12-01 (×4): 5 mg via ORAL
  Filled 2020-11-28 (×4): qty 1

## 2020-11-28 MED ORDER — BENZTROPINE MESYLATE 2 MG PO TABS
2.0000 mg | ORAL_TABLET | Freq: Three times a day (TID) | ORAL | Status: DC
  Administered 2020-11-28 – 2020-12-01 (×10): 2 mg via ORAL
  Filled 2020-11-28 (×12): qty 1

## 2020-11-28 MED ORDER — ESCITALOPRAM OXALATE 10 MG PO TABS
20.0000 mg | ORAL_TABLET | Freq: Every day | ORAL | Status: DC
  Administered 2020-11-28 – 2020-12-01 (×4): 20 mg via ORAL
  Filled 2020-11-28 (×3): qty 2

## 2020-11-28 MED ORDER — POLYETHYLENE GLYCOL 3350 17 G PO PACK: 17.0000 g | PACK | Freq: Every day | ORAL | Status: DC | PRN

## 2020-11-28 MED ORDER — BENZTROPINE MESYLATE 2 MG PO TABS
2.0000 mg | ORAL_TABLET | Freq: Three times a day (TID) | ORAL | Status: DC
  Filled 2020-11-28 (×2): qty 1

## 2020-11-28 NOTE — Consult Note (Signed)
Reason for Consult:Clavicle fx Referring Physician: Cheri Fowler Time called: 1040 Time at bedside: 1049   Russell Buchanan is an 45 y.o. male.  HPI: Dwyane fell while walking outside of his group home about 2d ago. He was not acting right and was brought to the ED where he was admitted. CT scan showed an incidental left clavicle fx and orthopedic surgery was consulted. He continues to be altered and cannot contribute to history.  Past Medical History:  Diagnosis Date   Antisocial personality disorder (HCC)    Bipolar disorder (HCC)    Environmental and seasonal allergies    GERD (gastroesophageal reflux disease)    Headache    Hyperlipidemia    Infected elbow (HCC)    Schizophrenia (HCC)    Seizures (HCC)    Tremors of nervous system     Past Surgical History:  Procedure Laterality Date   CLOSED REDUCTION WRIST FRACTURE Left    HARDWARE REMOVAL Right 04/14/2018   Procedure: HARDWARE REMOVAL-RIGHT ELBOW;  Surgeon: Deeann Saint, MD;  Location: [facility] ORS;  Service: Orthopedics;  Laterality: Right;   ORIF ELBOW FRACTURE Right    SCALP LACERATION REPAIR     WRIST RECONSTRUCTION Left     Family History  Family history unknown: Yes    Social History:  reports that he has been smoking cigarettes. He has a 3.50 pack-year smoking history. He has quit using smokeless tobacco.  His smokeless tobacco use included chew and snuff. He reports previous drug use. He reports that he does not drink alcohol.  Allergies:  Allergies  Allergen Reactions   Bee Venom Hives    Medications: I have reviewed the patient's current medications.  Results for orders placed or performed during the hospital encounter of 11/26/20 (from the past 48 hour(s))  MRSA Next Gen by PCR, Nasal     Status: None   Collection Time: 11/26/20 11:11 PM   Specimen: Nasal Mucosa; Nasal Swab  Result Value Ref Range   MRSA by PCR Next Gen NOT DETECTED NOT DETECTED    Comment: (NOTE) The GeneXpert MRSA Assay (FDA  approved for NASAL specimens only), is one component of a comprehensive MRSA colonization surveillance program. It is not intended to diagnose MRSA infection nor to guide or monitor treatment for MRSA infections. Test performance is not FDA approved in patients less than 71 years old. Performed at [facility], [address], [place], [place] [address]   Culture, Respiratory w Gram Stain (tracheal aspirate)     Status: None (Preliminary result)   Collection Time: 11/27/20  1:08 AM   Specimen: Tracheal Aspirate; Respiratory  Result Value Ref Range   Specimen Description TRACHEAL ASPIRATE    Special Requests NONE    Gram Stain      FEW WBC PRESENT,BOTH PMN AND MONONUCLEAR ABUNDANT GRAM NEGATIVE RODS ABUNDANT GRAM POSITIVE COCCI IN PAIRS IN CLUSTERS FEW GRAM POSITIVE RODS FEW YEAST    Culture      CULTURE REINCUBATED FOR BETTER GROWTH Performed at [facility], [address], [place], [place] [address]    Report Status PENDING   I-STAT 7, (LYTES, BLD GAS, ICA, H+H)     Status: Abnormal   Collection Time: 11/27/20  1:15 AM  Result Value Ref Range   pH, Arterial 7.457 (H) 7.350 - 7.450   pCO2 arterial 30.8 (L) 32.0 - 48.0 mmHg   pO2, Arterial 86 83.0 - 108.0 mmHg   Bicarbonate 22.0 20.0 - 28.0 mmol/L   TCO2 23  22 - 32 mmol/L   O2 Saturation 98.0 %   Acid-base deficit 2.0 0.0 - 2.0 mmol/L   Sodium 139 135 - 145 mmol/L   Potassium 3.4 (L) 3.5 - 5.1 mmol/L   Calcium, Ion 1.11 (L) 1.15 - 1.40 mmol/L   HCT 30.0 (L) 39.0 - 52.0 %   Hemoglobin 10.2 (L) 13.0 - 17.0 g/dL   Patient temperature 53.6 C    Collection site Radial    Drawn by RT    Sample type ARTERIAL   Glucose, capillary     Status: Abnormal   Collection Time: 11/27/20  1:31 AM  Result Value Ref Range   Glucose-Capillary 191 (H) 70 - 99 mg/dL    Comment: Glucose reference range applies only to samples taken after fasting for at least 8 hours.   Comment 1 Notify RN    Comment 2 Document in Chart   HIV  Antibody (routine testing w rflx)     Status: None   Collection Time: 11/27/20  2:21 AM  Result Value Ref Range   HIV Screen 4th Generation wRfx Non Reactive Non Reactive    Comment: Performed at [facility], [address], [place], [place] [address]  Comprehensive metabolic panel     Status: Abnormal   Collection Time: 11/27/20  2:21 AM  Result Value Ref Range   Sodium 139 135 - 145 mmol/L   Potassium 3.3 (L) 3.5 - 5.1 mmol/L   Chloride 104 98 - 111 mmol/L   CO2 20 (L) 22 - 32 mmol/L   Glucose, Bld 183 (H) 70 - 99 mg/dL    Comment: Glucose reference range applies only to samples taken after fasting for at least 8 hours.   BUN 28 (H) 6 - 20 mg/dL   Creatinine, Ser 4.74 (H) 0.61 - 1.24 mg/dL   Calcium 7.9 (L) 8.9 - 10.3 mg/dL   Total Protein 5.3 (L) 6.5 - 8.1 g/dL   Albumin 2.8 (L) 3.5 - 5.0 g/dL   AST 64 (H) 15 - 41 U/L   ALT 27 0 - 44 U/L   Alkaline Phosphatase 78 38 - 126 U/L   Total Bilirubin 1.4 (H) 0.3 - 1.2 mg/dL   GFR, Estimated 55 (L) >60 mL/min    Comment: (NOTE) Calculated using the CKD-EPI Creatinine Equation (2021)    Anion gap 15 5 - 15    Comment: Performed at [facility], [address], [place], [place] [address]  Magnesium     Status: None   Collection Time: 11/27/20  2:21 AM  Result Value Ref Range   Magnesium 1.8 1.7 - 2.4 mg/dL    Comment: Performed at [facility], [address], [place], [place] [address]  Phosphorus     Status: None   Collection Time: 11/27/20  2:21 AM  Result Value Ref Range   Phosphorus 3.5 2.5 - 4.6 mg/dL    Comment: Performed at [facility], [address] [place], [place] [address]  Brain natriuretic peptide     Status: None   Collection Time: 11/27/20  2:21 AM  Result Value Ref Range   B Natriuretic Peptide 95.8 0.0 - 100.0 pg/mL    Comment: Performed at [facility], [address], [place], [place] [address]  CBC WITH DIFFERENTIAL     Status: Abnormal   Collection Time: 11/27/20  2:21 AM   Result Value Ref Range   WBC 7.8 4.0 - 10.5 K/uL  RBC 3.48 (L) 4.22 - 5.81 MIL/uL   Hemoglobin 11.1 (L) 13.0 - 17.0 g/dL   HCT 44.3 (L) 15.4 - 00.8 %   MCV 91.4 80.0 - 100.0 fL   MCH 31.9 26.0 - 34.0 pg   MCHC 34.9 30.0 - 36.0 g/dL   RDW 67.6 19.5 - 09.3 %   Platelets 124 (L) 150 - 400 K/uL   nRBC 0.0 0.0 - 0.2 %   Neutrophils Relative % 65 %   Neutro Abs 5.0 1.7 - 7.7 K/uL   Lymphocytes Relative 23 %   Lymphs Abs 1.8 0.7 - 4.0 K/uL   Monocytes Relative 12 %   Monocytes Absolute 0.9 0.1 - 1.0 K/uL   Eosinophils Relative 0 %   Eosinophils Absolute 0.0 0.0 - 0.5 K/uL   Basophils Relative 0 %   Basophils Absolute 0.0 0.0 - 0.1 K/uL   Immature Granulocytes 0 %   Abs Immature Granulocytes 0.02 0.00 - 0.07 K/uL    Comment: Performed at [facility], [address], [place], [place] [address]  Protime-INR     Status: None   Collection Time: 11/27/20  2:21 AM  Result Value Ref Range   Prothrombin Time 14.9 11.4 - 15.2 seconds   INR 1.2 0.8 - 1.2    Comment: (NOTE) INR goal varies based on device and disease states. Performed at [facility], [address], [place], [place] [address]   APTT     Status: None   Collection Time: 11/27/20  2:21 AM  Result Value Ref Range   aPTT 25 24 - 36 seconds    Comment: Performed at [facility], [address], [place], [place] [address]  Triglycerides     Status: None   Collection Time: 11/27/20  2:21 AM  Result Value Ref Range   Triglycerides 77 <150 mg/dL    Comment: Performed at [facility], [address] [place], [place] [address]  TSH     Status: None   Collection Time: 11/27/20  2:21 AM  Result Value Ref Range   TSH 0.608 0.350 - 4.500 uIU/mL    Comment: Performed by a 3rd Generation assay with a functional sensitivity of <=0.01 uIU/mL. Performed at [facility], [address] [place], [place] [address]   Lactic acid, plasma     Status: Abnormal   Collection Time: 11/27/20  2:21 AM  Result  Value Ref Range   Lactic Acid, Venous 5.1 (HH) 0.5 - 1.9 mmol/L    Comment: CRITICAL RESULT CALLED TO, READ BACK BY AND VERIFIED WITH: EVERETTE D,RN 11/27/20 0325 WAYK Performed at [facility], [address], [place], [place] [address]   Glucose, capillary     Status: Abnormal   Collection Time: 11/27/20  3:28 AM  Result Value Ref Range   Glucose-Capillary 190 (H) 70 - 99 mg/dL    Comment: Glucose reference range applies only to samples taken after fasting for at least 8 hours.   Comment 1 Notify RN    Comment 2 Document in Chart   Lactic acid, plasma     Status: Abnormal   Collection Time: 11/27/20  4:17 AM  Result Value Ref Range   Lactic Acid, Venous 5.6 (HH) 0.5 - 1.9 mmol/L    Comment: CRITICAL VALUE NOTED.  VALUE IS CONSISTENT WITH PREVIOUSLY REPORTED AND CALLED VALUE. Performed at [facility], [address], [place], [place] [address]   Blood gas, venous  Status: Abnormal   Collection Time: 11/27/20  7:30 AM  Result Value Ref Range   FIO2 50.00    pH, Ven 7.394 7.250 - 7.430   pCO2, Ven 31.2 (L) 44.0 - 60.0 mmHg   Bicarbonate 18.6 (L) 20.0 - 28.0 mmol/L   Acid-base deficit 5.4 (H) 0.0 - 2.0 mmol/L   O2 Saturation 98.4 %   Patient temperature 37.0    Drawn by [facility]    Sample type VENOUS     Comment: Performed at [facility], [address] [place], [place] [address]  Glucose, capillary     Status: Abnormal   Collection Time: 11/27/20  7:59 AM  Result Value Ref Range   Glucose-Capillary 136 (H) 70 - 99 mg/dL    Comment: Glucose reference range applies only to samples taken after fasting for at least 8 hours.  Glucose, capillary     Status: Abnormal   Collection Time: 11/27/20 11:36 AM  Result Value Ref Range   Glucose-Capillary 163 (H) 70 - 99 mg/dL    Comment: Glucose reference range applies only to samples taken after fasting for at least 8 hours.  CK     Status: Abnormal   Collection Time: 11/27/20  3:27 PM  Result Value Ref Range   Total  CK 1,825 (H) 49 - 397 U/L    Comment: Performed at [facility], [address], [place], [place] [address]  Glucose, capillary     Status: Abnormal   Collection Time: 11/27/20  3:45 PM  Result Value Ref Range   Glucose-Capillary 156 (H) 70 - 99 mg/dL    Comment: Glucose reference range applies only to samples taken after fasting for at least 8 hours.  Glucose, capillary     Status: Abnormal   Collection Time: 11/27/20  7:27 PM  Result Value Ref Range   Glucose-Capillary 113 (H) 70 - 99 mg/dL    Comment: Glucose reference range applies only to samples taken after fasting for at least 8 hours.  CBC     Status: Abnormal   Collection Time: 11/28/20  5:22 AM  Result Value Ref Range   WBC 5.9 4.0 - 10.5 K/uL   RBC 2.98 (L) 4.22 - 5.81 MIL/uL   Hemoglobin 9.5 (L) 13.0 - 17.0 g/dL   HCT 27.2 (L) 53.6 - 64.4 %   MCV 94.0 80.0 - 100.0 fL   MCH 31.9 26.0 - 34.0 pg   MCHC 33.9 30.0 - 36.0 g/dL   RDW 03.4 74.2 - 59.5 %   Platelets  150 - 400 K/uL    PLATELET CLUMPS NOTED ON SMEAR, COUNT APPEARS DECREASED    Comment: Immature Platelet Fraction may be clinically indicated, consider ordering this additional test [id number]    nRBC 0.0 0.0 - 0.2 %    Comment: Performed at [facility], [address], [place], [place] [address]  Basic metabolic panel     Status: Abnormal   Collection Time: 11/28/20  5:22 AM  Result Value Ref Range   Sodium 139 135 - 145 mmol/L   Potassium 4.1 3.5 - 5.1 mmol/L   Chloride 106 98 - 111 mmol/L   CO2 25 22 - 32 mmol/L   Glucose, Bld 108 (H) 70 - 99 mg/dL    Comment: Glucose reference range applies only to samples taken after fasting for at least 8 hours.   BUN 12 6 - 20 mg/dL   Creatinine, Ser 8.84 0.61 - 1.24 mg/dL   Calcium  8.2 (L) 8.9 - 10.3 mg/dL   GFR, Estimated >04>60 >54>60 mL/min    Comment: (NOTE) Calculated using the CKD-EPI Creatinine Equation (2021)    Anion gap 8 5 - 15    Comment: Performed at [facility], [address]  [place], [place] [id number]  CK     Status: Abnormal   Collection Time: 11/28/20  5:22 AM  Result Value Ref Range   Total CK 884 (H) 49 - 397 U/L    Comment: Performed at [facility]  [facility], [address] [place], [place] [address]    DG Tibia/Fibula Left  Result Date: 11/26/2020 CLINICAL DATA:  Red and swollen left leg. EXAM: LEFT TIBIA AND FIBULA - 2 VIEW COMPARISON:  February 16, 2020 FINDINGS: There is no evidence of fracture or other focal bone lesions. Soft tissues are unremarkable. IMPRESSION: Negative. Electronically Signed   By: Aram Candelahaddeus  Houston M.D.   On: 11/26/2020 19:13   CT Head Wo Contrast  Result Date: 11/26/2020 CLINICAL DATA:  Status post fall. EXAM: CT HEAD WITHOUT CONTRAST TECHNIQUE: Contiguous axial images were obtained from the base of the skull through the vertex without intravenous contrast. COMPARISON:  June 08, 2020 FINDINGS: Brain: No evidence of acute infarction, hemorrhage, hydrocephalus, extra-axial collection or mass lesion/mass effect. Vascular: No hyperdense vessel or unexpected calcification. Skull: Normal. Negative for fracture or focal lesion. Sinuses/Orbits: Bilateral anterior maxillary sinus polyps versus mucous retention cysts are seen. Mild bilateral ethmoid sinus mucosal thickening is also noted. Other: Endotracheal and orogastric tubes are present. IMPRESSION: 1. No acute intracranial abnormality. 2. Bilateral maxillary sinus and bilateral ethmoid sinus disease. Electronically Signed   By: Aram Candelahaddeus  Houston M.D.   On: 11/26/2020 19:18   CT Cervical Spine Wo Contrast  Result Date: 11/26/2020 CLINICAL DATA:  Status post fall. EXAM: CT CERVICAL SPINE WITHOUT CONTRAST TECHNIQUE: Multidetector CT imaging of the cervical spine was performed without intravenous contrast. Multiplanar CT image reconstructions were also generated. COMPARISON:  April 17, 2020 FINDINGS: Alignment: Normal. Skull base and vertebrae: No acute fracture. No primary bone lesion or focal  pathologic process. Soft tissues and spinal canal: No prevertebral fluid or swelling. No visible canal hematoma. Disc levels: Normal multilevel endplates are seen with normal multilevel intervertebral disc spaces. Mild, bilateral multilevel facet joint hypertrophy is noted. Upper chest: Negative. Other: Orogastric and nasogastric tubes are present. IMPRESSION: No acute cervical spine fracture or subluxation. Electronically Signed   By: Aram Candelahaddeus  Houston M.D.   On: 11/26/2020 19:38   CT Tibia Fibula Left Wo Contrast  Result Date: 11/26/2020 CLINICAL DATA:  Left lower leg pain. EXAM: CT OF THE LOWER LEFT EXTREMITY WITHOUT CONTRAST TECHNIQUE: Multidetector CT imaging of the lower left extremity was performed according to the standard protocol. COMPARISON:  Left tibia and fibula x-rays from same day. Left ankle x-rays dated February 16, 2020. FINDINGS: Bones/Joint/Cartilage No acute fracture or dislocation. Old partially united fracture of the inferior lateral malleolus. Chronic avulsion fracture of the anterior medial malleolus. Joint spaces are preserved. Osteopenia. No joint effusion. Ligaments Ligaments are suboptimally evaluated by CT. Muscles and Tendons Grossly intact. No muscle atrophy. Soft tissue Mild circumferential soft tissue swelling of the lower leg. No fluid collection or hematoma. No soft tissue mass. IMPRESSION: 1. Mild circumferential soft tissue swelling of the lower leg, nonspecific. 2. No acute osseous abnormality. 3. Old bimalleolar fractures. Electronically Signed   By: Obie DredgeWilliam T Derry M.D.   On: 11/26/2020 19:01   DG CHEST PORT 1 VIEW  Result  Date: 11/27/2020 CLINICAL DATA:  Encephalopathy, central line placement EXAM: PORTABLE CHEST 1 VIEW COMPARISON:  11/26/2020 FINDINGS: Interval placement right neck vascular catheter, tip projecting over the lower SVC. Endotracheal tube remains in unchanged position, just below the thoracic inlet. Esophagogastric tube remains with tip below the  diaphragm, side port at the level gastroesophageal junction. There is new atelectasis or consolidation of left lung base. IMPRESSION: 1. Interval placement of right neck vascular catheter, tip projecting over the lower SVC. 2. Endotracheal tube remains in unchanged position, just below the thoracic inlet. 3. Esophagogastric tube remains with tip below the diaphragm, side port at the level of the gastroesophageal junction. Recommend advancement to ensure subdiaphragmatic positioning of tip and side port. 4. New atelectasis or consolidation of the left lung base. Electronically Signed   By: Lauralyn Primes M.D.   On: 11/27/2020 10:23   DG Chest Portable 1 View  Result Date: 11/26/2020 CLINICAL DATA:  Status post intubation. EXAM: PORTABLE CHEST 1 VIEW COMPARISON:  May 21, 2020 FINDINGS: An endotracheal tube is seen with its distal tip approximately 6.1 cm from the carina. A nasogastric tube is also noted with its distal tip approximately 5.0 cm distal to the gastroesophageal junction. There is no evidence of acute infiltrate, pleural effusion or pneumothorax. The heart size and mediastinal contours are within normal limits. The visualized skeletal structures are unremarkable. IMPRESSION: Endotracheal tube and nasogastric tube positioning, as described above. Further advancement of the nasogastric tube by approximately 6 cm is recommended to decrease the risk of aspiration. Electronically Signed   By: Aram Candela M.D.   On: 11/26/2020 19:11   Overnight EEG with video  Result Date: 11/27/2020 Charlsie Quest, MD     11/27/2020 10:50 AM Patient Name: Russell Buchanan MRN: [id number] Epilepsy Attending: Charlsie Quest Referring Physician/Provider: Dr Ritta Slot Duration: 11/27/2020 1231 to 11/27/2020 1030 Patient history: 45 yo M presenting with AMS after LOC with gaze deviation(which is reportedly a typical seizure) consistent with status epilepticus. EEG to evaluate for seizure Level of  alertness:  lethargic AEDs during EEG study: LEV, VPA, LCM, Gabapentin Technical aspects:  This EEG was obtained using a 10 lead EEG system positioned circumferentially without any parasagittal coverage (rapid EEG). Computer selected EEG is reviewed as well as background features and all clinically significant events. Description: No posterior dominant rhythm was seen. EEG showed continuous generalized 3 to 6 Hz theta-delta slowing. Hyperventilation and photic stimulation were not performed.   Parts of study were technically difficult due to high impedence and electrode artifact. ABNORMALITY - Continuous slow, generalized IMPRESSION: This study is suggestive of moderate diffuse encephalopathy, nonspecific etiology. No seizures or epileptiform discharges were seen throughout the recording. Charlsie Quest   ECHOCARDIOGRAM LIMITED  Result Date: 11/27/2020    ECHOCARDIOGRAM LIMITED REPORT   Patient Name:   DAVIER TRAMELL Date of Exam: 11/27/2020 Medical Rec #:  [id number]       Height:       72.0 in Accession #:    [id number]      Weight:       188.0 lb Date of Birth:  1975/07/26        BSA:          2.075 m Patient Age:    44 years        BP:           112/67 mmHg Patient Gender: M               HR:  58 bpm. Exam Location:  Inpatient Procedure: 2D Echo, Limited Echo, Cardiac Doppler and Color Doppler Indications:    I42.9 Cardiomyopathy (unspecified)  History:        Patient has prior history of Echocardiogram examinations, most                 recent 06/03/2017. Signs/Symptoms:Altered Mental Status.  Sonographer:    Roosvelt Maser RDCS Referring Phys: [id number] Lorin Glass IMPRESSIONS  1. Left ventricular ejection fraction, by estimation, is 60 to 65%. The left ventricle has normal function. The left ventricle has no regional wall motion abnormalities. Left ventricular diastolic parameters are indeterminate.  2. Right ventricular systolic function is normal. The right ventricular size is normal.  3. The  mitral valve is grossly normal. Mild mitral valve regurgitation. No evidence of mitral stenosis.  4. The aortic valve is normal in structure. Aortic valve regurgitation is mild. No aortic stenosis is present.  5. The tricuspid valve is grossly normal. Mild tricuspid valve regurgitation. No evidence of tricuspid stenosis.  6. Limited echo. Only parasternal views obtained as patient in restraints and unable to get apical images. FINDINGS  Left Ventricle: Left ventricular ejection fraction, by estimation, is 60 to 65%. The left ventricle has normal function. The left ventricle has no regional wall motion abnormalities. The left ventricular internal cavity size was normal in size. There is  no left ventricular hypertrophy. Left ventricular diastolic parameters are indeterminate. Right Ventricle: The right ventricular size is normal. No increase in right ventricular wall thickness. Right ventricular systolic function is normal. Left Atrium: Left atrial size was normal in size. Right Atrium: Right atrial size was normal in size. Pericardium: There is no evidence of pericardial effusion. Mitral Valve: The mitral valve is grossly normal. Mild mitral valve regurgitation. No evidence of mitral valve stenosis. Tricuspid Valve: Tricuspid valve regurgitation is mild . No evidence of tricuspid stenosis. Aortic Valve: The aortic valve is normal in structure. Aortic valve regurgitation is mild. No aortic stenosis is present. Pulmonic Valve: The pulmonic valve was grossly normal. Pulmonic valve regurgitation is trivial. No evidence of pulmonic stenosis. Aorta: The aortic root is normal in size and structure. IAS/Shunts: No atrial level shunt detected by color flow Doppler. LEFT VENTRICLE PLAX 2D LVIDd:         4.10 cm LVIDs:         2.60 cm LV PW:         0.90 cm LV IVS:        0.80 cm LVOT diam:     2.10 cm LVOT Area:     3.46 cm   AORTA Ao Asc diam: 2.90 cm TRICUSPID VALVE TR Peak grad:   22.1 mmHg TR Vmax:        235.00 cm/s   SHUNTS Systemic Diam: 2.10 cm Arvilla Meres MD Electronically signed by Arvilla Meres MD Signature Date/Time: 11/27/2020/5:22:45 PM    Final    CT CHEST ABDOMEN PELVIS WO CONTRAST  Result Date: 11/26/2020 CLINICAL DATA:  Status post fall with subsequent unresponsiveness. EXAM: CT CHEST, ABDOMEN AND PELVIS WITHOUT CONTRAST TECHNIQUE: Multidetector CT imaging of the chest, abdomen and pelvis was performed following the standard protocol without IV contrast. COMPARISON:  Oct 28, 2020 FINDINGS: CT CHEST FINDINGS Cardiovascular: No significant vascular findings. Normal heart size. No pericardial effusion. Mediastinum/Nodes: No enlarged mediastinal, hilar, or axillary lymph nodes. Thyroid gland, trachea, and esophagus demonstrate no significant findings. Lungs/Pleura: Endotracheal and orogastric tubes are in place. Mild atelectasis is seen within the  posterior aspect of the bilateral lower lobes. There is no evidence of acute infiltrate, pleural effusion or pneumothorax. Musculoskeletal: Acute fracture of the distal left clavicle is seen (axial CT images 6 through 10, CT series 2). Chronic sixth, seventh, eighth, ninth and tenth left rib fractures are seen. Chronic posterior tenth and eleventh right rib fractures are also noted. CT ABDOMEN PELVIS FINDINGS Hepatobiliary: No focal liver abnormality is seen. No gallstones, gallbladder wall thickening, or biliary dilatation. Pancreas: Unremarkable. No pancreatic ductal dilatation or surrounding inflammatory changes. Spleen: Normal in size without focal abnormality. Adrenals/Urinary Tract: Adrenal glands are unremarkable. Kidneys are normal, without renal calculi, focal lesion, or hydronephrosis. A Foley catheter is present within the urinary bladder. Stomach/Bowel: Stomach is within normal limits. The distal tip of the orogastric tube is noted just beyond the gastroesophageal junction. Appendix appears normal. Stool and retained contrast is noted throughout the large  bowel. No evidence of bowel wall thickening, distention, or inflammatory changes. Vascular/Lymphatic: Aortic atherosclerosis. No enlarged abdominal or pelvic lymph nodes. Reproductive: Prostate is unremarkable. Other: No abdominal wall hernia or abnormality. No abdominopelvic ascites. Musculoskeletal: A chronic fracture of the left transverse process of the L4 vertebral body is seen. No acute or significant osseous findings. IMPRESSION: 1. Acute fracture of the distal left clavicle. 2. Chronic L4 vertebral body left transverse process fracture with multiple chronic bilateral rib fractures. 3. Orogastric tube positioning, as described above. Further advancement of the OG tube by approximately 6 cm is recommended to further decrease the risk of aspiration. 4. No acute or active process within the abdomen or pelvis. Electronically Signed   By: Aram Candela M.D.   On: 11/26/2020 19:33    Review of Systems  Unable to perform ROS: Mental status change  Blood pressure 120/65, pulse (!) 45, temperature (!) 97.52 F (36.4 C), temperature source Bladder, resp. rate 14, weight 80 kg, SpO2 100 %. Physical Exam Constitutional:      General: He is not in acute distress.    Appearance: He is well-developed. He is not diaphoretic.  HENT:     Head: Normocephalic and atraumatic.  Eyes:     General: No scleral icterus.       Right eye: No discharge.        Left eye: No discharge.     Conjunctiva/sclera: Conjunctivae normal.  Cardiovascular:     Rate and Rhythm: Normal rate and regular rhythm.  Pulmonary:     Effort: Pulmonary effort is normal. No respiratory distress.  Musculoskeletal:     Cervical back: Normal range of motion.     Comments: Left  shoulder, elbow, wrist, digits- no skin wounds, mild TTP shoulder, no instability, no blocks to motion  Sens  Ax/R/M/U intact  Mot   Ax/ R/ PIN/ M/ AIN/ U grossly intact  Rad 2+  Skin:    General: Skin is warm and dry.  Neurological:     Mental Status: He  is alert.  Psychiatric:        Mood and Affect: Mood normal.        Behavior: Behavior normal.    Assessment/Plan: Left clavicle fx -- Plan non-operative management with NWB and sling, motion ok. F/u with Dr. Eulah Pont in 2 weeks.    Murdock Caldron, PA-C Orthopedic Surgery [phone] 11/28/2020, 11:06 AM

## 2020-11-28 NOTE — Progress Notes (Signed)
Orthopedic Tech Progress Note Patient Details:  Russell Buchanan 1975/11/05 [id number]  Ortho Devices Type of Ortho Device: Shoulder immobilizer Ortho Device/Splint Location: LUE Ortho Device/Splint Interventions: Ordered, Application   Post Interventions Patient Tolerated: Well Instructions Provided: Care of device  [name] 11/28/2020, 5:34 PM

## 2020-11-28 NOTE — Progress Notes (Signed)
NAME:  Russell Buchanan, MRN:  [id number], DOB:  1975/11/03, LOS: 2 ADMISSION DATE:  11/26/2020, CONSULTATION DATE:  11/26/20 REFERRING MD:  Dr. Fuller Plan, CHIEF COMPLAINT:  Altered Mental status   History of Present Illness:  This is a 45 yo male with history of schizophrenia and bipolar disease, seizure disorder, migraines, HLD, GERD who presents for altered mentation. Lives at group home. While there he was noted to fall while walking outside. Was noted ot have a left gaze deviation with this. It than became right sided gaze deviation. In Ed was noted to continue to be non responsive. Was given Narcan with no improvement. Was also given 2mg  of ativan in case patient was was having non convulsive SE. No improvement after ativan administration. Patient was intubated for airway protection. Critical care consulted for admission.   In the ED UDS positive for tricyclics. UA not concerning for infection. LA elevated at 3.7. CK elevated at 609. Troponin normal x 2. Tylenol and Salicylate levels negative. Procalcitonin negative. Valproic avcid level therapeutic at 70. Covid testing negative. Ammonia pending.  Febrile 102.9 with erythema/ swelling of LLE with open sores.   His caretaker at [facility] reported that the patient often has episodes where he will pass out just like this. Usually it seems that he comes back around in a few minutes. They will put him in his bed and he will recover. He had one of these epsiodes yesterday. The difference between yesterday and today is that he did not improve on his own after a few minutes so they called EMS.  He has been hospitalized for episodes like this in the past as well.   He is in stable condition in the 4N ICU.  Pertinent  Medical History  schizophrenia  bipolar disease seizure disorder,  Migraines HLD GERD   Significant Hospital Events: Including procedures, antibiotic start and stop dates in addition to other pertinent events   Tx from [facility]  to [facility], intubated on pressors, admitted to ICU on 11/26/20, intubated, cEEG   6/26 R IJ CVL >>  6/25 [facility]) >> 6/26 Trach asp >>  6/25 vanc >> 6/25 cefepime >> 6/25 acyclovir   Interim History / Subjective:  Off epi gtt, no gtt  Extubated 06/26 at 100  On Room air  Tmax 99  Subjective: Denies chest pain, SOB, and pain   Objective   Blood pressure 111/65, pulse (!) 48, temperature (!) 97.52 F (36.4 C), temperature source Bladder, resp. rate 15, weight 80 kg, SpO2 99 %.        Intake/Output Summary (Last 24 hours) at 11/28/2020 0804 Last data filed at 11/28/2020 0600 Gross per 24 hour  Intake 2709.92 ml  Output 1555 ml  Net 1154.92 ml   Filed Weights   11/28/20 0454  Weight: 80 kg    Examination: General:  in bed, no acute distress, appears comfortable HEENT: MM pink/moist, anicteric, atraumatic Neuro: GCS 14, confused, RASS 0, PERRL 75mm CV: S1S2, SB on monitor, no m/r/g appreciated PULM:  Clear in the upper lobes and in the lower lobes, trachea midline, chest expansion symmetric GI: soft, bsx4 active, non distended Extremities: warm/dry, edema in LLE, capillary refill less than 3 seconds  Skin: see image below, no other rashes or lesions.     Labs/imaging that I have personally reviewed  (right click and "Reselect all SmartList Selections" daily)  BMP CBC-Normocytic anemia CK 1825>884 ECHO- EF 60-65%, RVSF normal, no wall motion abnormalities  LE 1m- No DVT  Resolved Hospital Problem list   NA  Assessment & Plan:   Acute Encephalopathy- Improving. From sepsis from LLE wound or postical state initially. No leukocytosis. Tmax 99. Head CT negative. Question from neurology regarding if LLE celluitis lowered threshold for seizures Hx Seixures -Appreciate Neurology assistance. Workup per neurology -EEG negative for seizures. Discontinued. -Neuro conrinuing home Valproic acid 500 TID, increased gabapentin to 600 TID, increased home lacosamide to 150mg   TID. Keppra 500mg  -Continue Aspiration precautions --Continue neuroprotective measures- normothermia, euglycemia, HOB greater than 30, head in neutral alignment, normocapnia, normoxia.   Acute respiratory insufficiency secondary to ?seizure-Resolved On room air. Extubated 6/26 -Continue pulmonary toilet. OOB, DB and cough. -Goal SPO2 greater than 92%  LLE Cellulitis Shock- resolved Off epinephrine gtt. LLE neg for DVT. ECHO- EF 60-65%, RVSF normal, no wall motion abnormalities  -Stopping Vanc. Continue cefepime for celluitis -Stopping stress dose steroids. -Follow Fever/WBC curve -Goal MAP of 65 or greater. -Continue LR at 100 today. -Monitor LLE leg exam -Follow up 6/25 BC and TA  AKI- creatinine elevated at 2.2 from baseline of 0.81. Now 0.77. CK 1825>884  -Ensure renal perfusion. Goal MAP 65 or greater. -Avoid neprotoxic drugs as possible. -Strict I&O's -Follow up AM creatinine -Continue LR as discussed  Bipolar schizophrenia- -Restarted home Abilify 5mg , lexapro 20mg , and cogentin 2mg . Appreciate pharmacy assistance.  Acute fracture of left clavicle Seen on CT of chest -Ortho consulted -NWB in L arm -Management with sling. -Follow up in two weeks with Dr. 7/26.   Migraine disorder- -Continue holding home mediations -Holding propranalol due to bradycardia.     Best Practice (right click and "Reselect all SmartList Selections" daily)   Diet/type: dysphagia diet (see orders) Pain/Anxiety/Delirium protocol RASS goal: 0/ d/c VAP protocol (if indicated): Not indicated- d/c DVT prophylaxis: prophylactic heparin  GI prophylaxis: H2B Glucose control:  not indicated Central venous access:  removal ordered  given ongoing pressor requirements Arterial line:  N/A Foley:  removal ordered  Mobility:  OOB  PT consulted: Yes Studies pending: None Culture data pending:sputum and blood Last reviewed culture data:today Antibiotics:CTX, Vanc Antibiotic de-escalation: yes,  de-escalation ordered.  Stop date: to be determined  Code Status:  full code  - 6/25 He does not have DNR at facility that caretaker is aware of. Attempted to call who is the administrator for further details on code status and potential procedures. No answer x2 - pending update 6/26 Last date of multidisciplinary goals of care discussion [NA] Disposition: ready for transfer to progressive care     Critical care time: n/a     ., MSN, APRN, AGACNP-BC [facility]  11/28/2020 , 8:04 AM  Please see [email or web address] for pager details  If no response, please call [phone] After hours, please call Elink at [phone]

## 2020-11-28 NOTE — TOC CAGE-AID Note (Signed)
Transition of Care [facility]) - CAGE-AID Screening   Patient Details  Name: Russell Buchanan MRN: [id number] Date of Birth: 10-18-1975  [facility] CM/SW Contact:    Mearl Latin, LCSW Phone Number: 11/28/2020, 11:19 AM   Clinical Narrative: Patient is disoriented and not appropriate for screening.    CAGE-AID Screening: Substance Abuse Screening unable to be completed due to: : Patient unable to participate

## 2020-11-28 NOTE — Progress Notes (Signed)
Lower extremity venous LT study completed.   Please see CV Proc for preliminary results.   Taaj Hurlbut, RDMS, RVT  

## 2020-11-29 DIAGNOSIS — F203 Undifferentiated schizophrenia: Secondary | ICD-10-CM

## 2020-11-29 DIAGNOSIS — E876 Hypokalemia: Secondary | ICD-10-CM | POA: Diagnosis present

## 2020-11-29 DIAGNOSIS — N179 Acute kidney failure, unspecified: Secondary | ICD-10-CM

## 2020-11-29 DIAGNOSIS — R6521 Severe sepsis with septic shock: Secondary | ICD-10-CM | POA: Diagnosis present

## 2020-11-29 DIAGNOSIS — D649 Anemia, unspecified: Secondary | ICD-10-CM | POA: Diagnosis present

## 2020-11-29 DIAGNOSIS — F319 Bipolar disorder, unspecified: Secondary | ICD-10-CM

## 2020-11-29 DIAGNOSIS — D696 Thrombocytopenia, unspecified: Secondary | ICD-10-CM | POA: Diagnosis not present

## 2020-11-29 DIAGNOSIS — A419 Sepsis, unspecified organism: Secondary | ICD-10-CM | POA: Diagnosis present

## 2020-11-29 DIAGNOSIS — L0390 Cellulitis, unspecified: Secondary | ICD-10-CM | POA: Diagnosis present

## 2020-11-29 LAB — BASIC METABOLIC PANEL
Anion gap: 8 (ref 5–15)
BUN: 8 mg/dL (ref 6–20)
CO2: 25 mmol/L (ref 22–32)
Calcium: 8.3 mg/dL — ABNORMAL LOW (ref 8.9–10.3)
Chloride: 106 mmol/L (ref 98–111)
Creatinine, Ser: 0.61 mg/dL (ref 0.61–1.24)
GFR, Estimated: >60 mL/min (ref >60)
Glucose, Bld: 82 mg/dL (ref 70–99)
Potassium: 3.2 mmol/L — ABNORMAL LOW (ref 3.5–5.1)
Sodium: 139 mmol/L (ref 135–145)

## 2020-11-29 LAB — CBC
HCT: 27.3 % — ABNORMAL LOW (ref 39.0–52.0)
Hemoglobin: 9.3 g/dL — ABNORMAL LOW (ref 13.0–17.0)
MCH: 32.2 pg (ref 26.0–34.0)
MCHC: 34.1 g/dL (ref 30.0–36.0)
MCV: 94.5 fL (ref 80.0–100.0)
Platelets: 76 10*3/uL — ABNORMAL LOW (ref 150–400)
RBC: 2.89 MIL/uL — ABNORMAL LOW (ref 4.22–5.81)
RDW: 14.1 % (ref 11.5–15.5)
WBC: 6.8 10*3/uL (ref 4.0–10.5)
nRBC: 0 % (ref 0.0–0.2)

## 2020-11-29 LAB — CULTURE, RESPIRATORY W GRAM STAIN: Culture: NORMAL

## 2020-11-29 MED ORDER — POTASSIUM CHLORIDE CRYS ER 20 MEQ PO TBCR
40.0000 meq | EXTENDED_RELEASE_TABLET | Freq: Once | ORAL | Status: AC
  Administered 2020-11-29: 40 meq via ORAL
  Filled 2020-11-29: qty 2

## 2020-11-29 NOTE — Progress Notes (Signed)
PROGRESS NOTE    Russell Buchanan  LPF:[id number] DOB: January 13, 1976 DOA: 11/26/2020 PCP: Koren Bound, NP    Brief Narrative:  45 year old male with history of seizure disorder, bipolar and schizophrenia, presented to [facility] with respiratory failure, septic shock and status epilepticus.  He was admitted in the emergency room and started on vasopressors.  Transferred to [facility], ICU for further management.  He was evaluated by neurology who felt that his seizure threshold may have been lowered due to infection.  He was continued on his home seizure medications.  Patient was able to extubate the following day after admission and vasopressors have since been weaned off.  Cellulitis appears to be improving.  Hemodynamics have also improved.  Physical therapy consultation has been requested to assess appropriate discharge disposition.   Assessment & Plan:   Principal Problem:   Clavicle fracture Active Problems:   Schizophrenia (HCC)   Acute encephalopathy   Bipolar disorder (HCC)   Seizure (HCC)   Encephalopathy   Cellulitis   Severe sepsis with septic shock (HCC)   AKI (acute kidney injury) (HCC)   Thrombocytopenia (HCC)   Hypokalemia   Anemia   Acute encephalopathy -Related to seizures, sepsis -Mental status appears to have improved back to baseline -Currently has waist/belt restraint.  Since he appears calm, will discontinue and observe  Septic shock secondary to left lower extremity cellulitis -Patient was noted to be febrile, tachypneic on admission, he had elevated lactic acid up to 5.1, also had acute kidney injury -He required to be started on Levophed on admission, this has since been weaned off. -He was treated with broad-spectrum antibiotics and is currently on cefepime. -His blood cultures have not shown any growth -CT scan performed of his left leg on admission did not show any evidence of necrotizing fasciitis -Venous Dopplers negative for DVT -Overall erythema  appears to be improving -Would continue with current treatments  Acute respiratory failure with hypoxia -Related to seizures/sepsis -Required nonrebreather on admission since he was hypoxic in the 80s on room air -Subsequently required intubation with mechanical ventilation -He has since been extubated and is currently breathing comfortably on room air  Known seizure disorder with status epilepticus -Presented with persistent seizures on admission -Followed by neurology -EEG did not show any epileptiform discharges -It was felt that his underlying infection may have lowered his seizure threshold -Currently he is on his chronic seizure medications and appears to be stable -No further seizures  Acute kidney injury -Secondary to volume depletion/sepsis -Creatinine noted to be 2.2 on admission, this is improved to 0.7 -Continue to monitor  Thrombocytopenia -Suspect is related to sepsis -No signs of bleeding -Hold further subcutaneous heparin -Continue to monitor  Anemia -Suspect this is related to critical illness -No evidence of bleeding -Hemoglobin has been stable -Continue to monitor  Bipolar/schizophrenia -On Abilify, Lexapro and Cogentin  Acute left clavicular fracture status post fall -Seen by orthopedics -Recommendations for nonoperative management with nonweightbearing status and sling, motion is okay. -Follow-up with Dr. Eulah Pont in 2 weeks  History of migraines -Chronically on propranolol which has been held due to bradycardia  Generalized weakness -We will request physical therapy/Occupational Therapy evaluation   DVT prophylaxis: SCDs Start: 11/26/20 2306  Code Status: full code Family Communication: discussed with patient Disposition Plan: Status is: Inpatient  Remains inpatient appropriate because:IV treatments appropriate due to intensity of illness or inability to take PO and Inpatient level of care appropriate due to severity of illness  Dispo: The  patient is from:  Group home              Anticipated d/c is to: Group home              Patient currently is not medically stable to d/c.   Difficult to place patient No         Consultants:  PCCM Neurology  Procedures:  ETT 6/25>6/26 EEG: negative for epileptiform discharges Echo: EF 60-65% Venous doppler LLE: neg for DVT  Antimicrobials:  Cefepime   Subjective: Denies any complaints.  Currently on clear liquids and wants to advance to solid food.  Objective: Vitals:   11/29/20 0436 11/29/20 0500 11/29/20 1225 11/29/20 1600  BP: 99/67  103/68 103/60  Pulse: 60  (!) 50 64  Resp: 16  18 18   Temp: 98.4 F (36.9 C)  98.4 F (36.9 C) 98.2 F (36.8 C)  TempSrc: Oral  Oral Oral  SpO2: 97%  99% 99%  Weight:  81.5 kg      Intake/Output Summary (Last 24 hours) at 11/29/2020 1653 Last data filed at 11/29/2020 1450 Gross per 24 hour  Intake 2355 ml  Output 1400 ml  Net 955 ml   Filed Weights   11/28/20 0454 11/29/20 0500  Weight: 80 kg 81.5 kg    Examination:  General exam: Appears calm and comfortable  Respiratory system: Clear to auscultation. Respiratory effort normal. Cardiovascular system: S1 & S2 heard, RRR. No JVD, murmurs, rubs, gallops or clicks. No pedal edema. Gastrointestinal system: Abdomen is nondistended, soft and nontender. No organomegaly or masses felt. Normal bowel sounds heard. Central nervous system: Alert and oriented. No focal neurological deficits. Extremities: Symmetric 5 x 5 power. Skin: erythema in LLE improving, he has developed a large blister on LLE Psychiatry: Judgement and insight appear normal. Mood & affect appropriate.        Data Reviewed: I have personally reviewed following labs and imaging studies  CBC: Recent Labs  Lab 11/26/20 1654 11/27/20 0115 11/27/20 0221 11/28/20 0522 11/29/20 0350  WBC 5.0  --  7.8 5.9 6.8  NEUTROABS 2.1  --  5.0  --   --   HGB 11.7* 10.2* 11.1* 9.5* 9.3*  HCT 33.5* 30.0* 31.8*  28.0* 27.3*  MCV 91.5  --  91.4 94.0 94.5  PLT 131*  --  124* PLATELET CLUMPS NOTED ON SMEAR, COUNT APPEARS DECREASED 76*   Basic Metabolic Panel: Recent Labs  Lab 11/26/20 1654 11/27/20 0115 11/27/20 0221 11/28/20 0522 11/29/20 0350  NA 136 139 139 139 139  K 4.8 3.4* 3.3* 4.1 3.2*  CL 102  --  104 106 106  CO2 22  --  20* 25 25  GLUCOSE 95  --  183* 108* 82  BUN 40*  --  28* 12 8  CREATININE 2.21*  --  1.57* 0.77 0.61  CALCIUM 8.6*  --  7.9* 8.2* 8.3*  MG  --   --  1.8  --   --   PHOS  --   --  3.5  --   --    GFR: Estimated Creatinine Clearance: 129.3 mL/min (by C-G formula based on SCr of 0.61 mg/dL). Liver Function Tests: Recent Labs  Lab 11/26/20 1654 11/27/20 0221  AST 51* 64*  ALT 22 27  ALKPHOS 92 78  BILITOT 1.3* 1.4*  PROT 6.2* 5.3*  ALBUMIN 3.2* 2.8*   No results for input(s): LIPASE, AMYLASE in the last 168 hours. No results for input(s): AMMONIA in the last 168 hours. Coagulation Profile:  Recent Labs  Lab 11/27/20 0221  INR 1.2   Cardiac Enzymes: Recent Labs  Lab 11/26/20 1714 11/27/20 1527 11/28/20 0522  CKTOTAL 609* 1,825* 884*   BNP (last 3 results) No results for input(s): PROBNP in the last 8760 hours. HbA1C: No results for input(s): HGBA1C in the last 72 hours. CBG: Recent Labs  Lab 11/27/20 0328 11/27/20 0759 11/27/20 1136 11/27/20 1545 11/27/20 1927  GLUCAP 190* 136* 163* 156* 113*   Lipid Profile: Recent Labs    11/27/20 0221  TRIG 77   Thyroid Function Tests: Recent Labs    11/27/20 0221  TSH 0.608   Anemia Panel: No results for input(s): VITAMINB12, FOLATE, FERRITIN, TIBC, IRON, RETICCTPCT in the last 72 hours. Sepsis Labs: Recent Labs  Lab 11/26/20 1651 11/26/20 1654 11/26/20 1731 11/27/20 0221 11/27/20 0417  PROCALCITON  --  <0.10  --   --   --   LATICACIDVEN 2.8*  --  3.7* 5.1* 5.6*    Recent Results (from the past 240 hour(s))  Blood culture (routine x 2)     Status: None (Preliminary result)    Collection Time: 11/26/20  4:51 PM   Specimen: BLOOD  Result Value Ref Range Status   Specimen Description BLOOD RIGHT ANTECUBITAL  Final   Special Requests   Final    BOTTLES DRAWN AEROBIC AND ANAEROBIC Blood Culture adequate volume   Culture   Final    NO GROWTH 3 DAYS Performed at [facility], [address] [place], [place] [address]    Report Status PENDING  Incomplete  Blood culture (routine x 2)     Status: None (Preliminary result)   Collection Time: 11/26/20  4:51 PM   Specimen: BLOOD  Result Value Ref Range Status   Specimen Description BLOOD RIGHT HAND  Final   Special Requests   Final    BOTTLES DRAWN AEROBIC AND ANAEROBIC Blood Culture results may not be optimal due to an excessive volume of blood received in culture bottles   Culture   Final    NO GROWTH 3 DAYS Performed at [facility], [address] [place], [place] [address]    Report Status PENDING  Incomplete  Resp Panel by RT-PCR (Flu A&B, Covid) Nasopharyngeal Swab     Status: None   Collection Time: 11/26/20  4:51 PM   Specimen: Nasopharyngeal Swab; Nasopharyngeal(NP) swabs in vial transport medium  Result Value Ref Range Status   SARS Coronavirus 2 by RT PCR NEGATIVE NEGATIVE Final    Comment: (NOTE) SARS-CoV-2 target nucleic acids are NOT DETECTED.  The SARS-CoV-2 RNA is generally detectable in upper respiratory specimens during the acute phase of infection. The lowest concentration of SARS-CoV-2 viral copies this assay can detect is 138 copies/mL. A negative result does not preclude SARS-Cov-2 infection and should not be used as the sole basis for treatment or other patient management decisions. A negative result may occur with  improper specimen collection/handling, submission of specimen other than nasopharyngeal swab, presence of viral mutation(s) within the areas targeted by this assay, and inadequate number of viral copies(<138 copies/mL). A negative result must be combined  with clinical observations, patient history, and epidemiological information. The expected result is Negative.  Fact Sheet for Patients:  BloggerCourse.comhttps://www.fda.gov/media/152166/download  Fact Sheet for Healthcare Providers:  SeriousBroker.ithttps://www.fda.gov/media/152162/download  This test is no t yet approved or cleared by the [place] FDA and  has been authorized for detection and/or diagnosis of SARS-CoV-2 by FDA under an Emergency Use Authorization (EUA). This  EUA will remain  in effect (meaning this test can be used) for the duration of the COVID-19 declaration under Section 564(b)(1) of the Act, 21 U.S.C.section 360bbb-3(b)(1), unless the authorization is terminated  or revoked sooner.       Influenza A by PCR NEGATIVE NEGATIVE Final   Influenza B by PCR NEGATIVE NEGATIVE Final    Comment: (NOTE) The Xpert Xpress SARS-CoV-2/FLU/RSV plus assay is intended as an aid in the diagnosis of influenza from Nasopharyngeal swab specimens and should not be used as a sole basis for treatment. Nasal washings and aspirates are unacceptable for Xpert Xpress SARS-CoV-2/FLU/RSV testing.  Fact Sheet for Patients: BloggerCourse.com  Fact Sheet for Healthcare Providers: SeriousBroker.it  This test is not yet approved or cleared by the [place] FDA and has been authorized for detection and/or diagnosis of SARS-CoV-2 by FDA under an Emergency Use Authorization (EUA). This EUA will remain in effect (meaning this test can be used) for the duration of the COVID-19 declaration under Section 564(b)(1) of the Act, 21 U.S.C. section 360bbb-3(b)(1), unless the authorization is terminated or revoked.  Performed at [facility], [address] [place], [place] [address]   MRSA Next Gen by PCR, Nasal     Status: None   Collection Time: 11/26/20 11:11 PM   Specimen: Nasal Mucosa; Nasal Swab  Result Value Ref Range Status   MRSA by PCR Next Gen  NOT DETECTED NOT DETECTED Final    Comment: (NOTE) The GeneXpert MRSA Assay (FDA approved for NASAL specimens only), is one component of a comprehensive MRSA colonization surveillance program. It is not intended to diagnose MRSA infection nor to guide or monitor treatment for MRSA infections. Test performance is not FDA approved in patients less than 53 years old. Performed at [facility], [address] [place], [place] [address]   Culture, Respiratory w Gram Stain (tracheal aspirate)     Status: None   Collection Time: 11/27/20  1:08 AM   Specimen: Tracheal Aspirate; Respiratory  Result Value Ref Range Status   Specimen Description TRACHEAL ASPIRATE  Final   Special Requests NONE  Final   Gram Stain   Final    FEW WBC PRESENT,BOTH PMN AND MONONUCLEAR ABUNDANT GRAM NEGATIVE RODS ABUNDANT GRAM POSITIVE COCCI IN PAIRS IN CLUSTERS FEW GRAM POSITIVE RODS FEW YEAST    Culture   Final    ABUNDANT Consistent with normal respiratory flora. No Pseudomonas species isolated Performed at [facility], [address], [place], [place] [address]    Report Status 11/29/2020 FINAL  Final         Radiology Studies: VAS [place] LOWER EXTREMITY VENOUS (DVT)  Result Date: 11/28/2020  Lower Venous DVT Study Patient Name:  Russell Buchanan  Date of Exam:   11/28/2020 Medical Rec #: [id number]        Accession #:    [id number] Date of Birth: 01-22-1976         Patient Gender: M Patient Age:   35Y Exam Location:  [facility] Procedure:      VAS [place] LOWER EXTREMITY VENOUS (DVT) Referring Phys: [id number] Lorin Glass --------------------------------------------------------------------------------  Indications: Edema and erythema.  Limitations: Poor ultrasound/tissue interface and blisters on distal calf. Comparison Study: 06-25-2018 Prior bilateral lower extremity venous was negative                   for DVT. Performing Technologist: Jean Rosenthal RDMS,RVT  Examination Guidelines: A complete  evaluation includes B-mode imaging,  spectral Doppler, color Doppler, and power Doppler as needed of all accessible portions of each vessel. Bilateral testing is considered an integral part of a complete examination. Limited examinations for reoccurring indications may be performed as noted. The reflux portion of the exam is performed with the patient in reverse Trendelenburg.  +-----+---------------+---------+-----------+----------+--------------+ RIGHTCompressibilityPhasicitySpontaneityPropertiesThrombus Aging +-----+---------------+---------+-----------+----------+--------------+ CFV  Full           Yes      Yes                                 +-----+---------------+---------+-----------+----------+--------------+   +---------+---------------+---------+-----------+----------+-------------------+ LEFT     CompressibilityPhasicitySpontaneityPropertiesThrombus Aging      +---------+---------------+---------+-----------+----------+-------------------+ CFV      Full           Yes      Yes                                      +---------+---------------+---------+-----------+----------+-------------------+ SFJ      Full                                                             +---------+---------------+---------+-----------+----------+-------------------+ FV Prox  Full                                                             +---------+---------------+---------+-----------+----------+-------------------+ FV Mid   Full                                                             +---------+---------------+---------+-----------+----------+-------------------+ FV DistalFull                                                             +---------+---------------+---------+-----------+----------+-------------------+ PFV      Full                                                              +---------+---------------+---------+-----------+----------+-------------------+ POP      Full           Yes      Yes                                      +---------+---------------+---------+-----------+----------+-------------------+ PTV      Full                                                             +---------+---------------+---------+-----------+----------+-------------------+  PERO     Full                                         Some segments not                                                         well visualized     +---------+---------------+---------+-----------+----------+-------------------+    Summary: RIGHT: - No evidence of common femoral vein obstruction.  LEFT: - There is no evidence of deep vein thrombosis in the lower extremity. However, portions of this examination were limited- see technologist comments above.  - No cystic structure found in the popliteal fossa.  *See table(s) above for measurements and observations. Electronically signed by Fabienne Bruns MD on 11/28/2020 at 7:05:41 PM.    Final         Scheduled Meds:  ARIPiprazole  5 mg Oral Daily   benztropine  2 mg Oral TID   chlorhexidine  15 mL Mouth Rinse BID   Chlorhexidine Gluconate Cloth  6 each Topical Daily   escitalopram  20 mg Oral Daily   gabapentin  600 mg Oral TID   mouth rinse  15 mL Mouth Rinse q12n4p   potassium chloride  40 mEq Oral Once   Continuous Infusions:  ceFEPime (MAXIPIME) IV 2 g (11/29/20 1439)   lacosamide (VIMPAT) IV 150 mg (11/29/20 1017)   lactated ringers 100 mL/hr at 11/29/20 0539   levETIRAcetam 500 mg (11/29/20 1130)   valproate sodium 500 mg (11/29/20 1159)     LOS: 3 days    Time spent:    Erick Blinks, MD [facility]   If 7PM-7AM, please contact night-coverage [email or web address]  11/29/2020, 4:53 PM

## 2020-11-29 NOTE — Progress Notes (Signed)
RN went to check on patient and help him position his dinner tray. Patient stated "one of my teeth came out, I put it on the tray." When asked how it happened patient stated "it just kept crunching while I was trying to chew so it needed to come out." MD notified.

## 2020-11-30 DIAGNOSIS — L03818 Cellulitis of other sites: Secondary | ICD-10-CM

## 2020-11-30 LAB — CBC
HCT: 29.1 % — ABNORMAL LOW (ref 39.0–52.0)
Hemoglobin: 10 g/dL — ABNORMAL LOW (ref 13.0–17.0)
MCH: 31.5 pg (ref 26.0–34.0)
MCHC: 34.4 g/dL (ref 30.0–36.0)
MCV: 91.8 fL (ref 80.0–100.0)
Platelets: 97 10*3/uL — ABNORMAL LOW (ref 150–400)
RBC: 3.17 MIL/uL — ABNORMAL LOW (ref 4.22–5.81)
RDW: 13.6 % (ref 11.5–15.5)
WBC: 6.5 10*3/uL (ref 4.0–10.5)
nRBC: 0 % (ref 0.0–0.2)

## 2020-11-30 LAB — BASIC METABOLIC PANEL
Anion gap: 4 — ABNORMAL LOW (ref 5–15)
BUN: <5 mg/dL — ABNORMAL LOW (ref 6–20)
CO2: 28 mmol/L (ref 22–32)
Calcium: 8.4 mg/dL — ABNORMAL LOW (ref 8.9–10.3)
Chloride: 109 mmol/L (ref 98–111)
Creatinine, Ser: 0.57 mg/dL — ABNORMAL LOW (ref 0.61–1.24)
GFR, Estimated: >60 mL/min (ref >60)
Glucose, Bld: 95 mg/dL (ref 70–99)
Potassium: 3.5 mmol/L (ref 3.5–5.1)
Sodium: 141 mmol/L (ref 135–145)

## 2020-11-30 LAB — TRIGLYCERIDES: Triglycerides: 122 mg/dL (ref <150)

## 2020-11-30 LAB — MAGNESIUM: Magnesium: 1.7 mg/dL (ref 1.7–2.4)

## 2020-11-30 NOTE — Progress Notes (Signed)
PROGRESS NOTE    Ernestina ColumbiaClifton Housley  JXB:[id number] DOB: 01/02/1976 DOA: 11/26/2020 PCP: Koren BoundMatrone, Andrew, NP    Brief Narrative:  45 year old male with history of seizure disorder, bipolar and schizophrenia, presented to [facility] with respiratory failure, septic shock and status epilepticus.  He was admitted in the emergency room and started on vasopressors.  [facility].  He was evaluated by Neurology who felt that his seizure threshold may have been lowered due to infection.  He was continued on his home seizure medications.  Patient was able to extubate the following day after admission and vasopressors have since been weaned off.  Cellulitis continues to improve.  Awaiting therapies recommendation to figure out disposition.  OT recommends no follow-up.  PT input pending.   Assessment & Plan:   Principal Problem:   Clavicle fracture Active Problems:   Schizophrenia (HCC)   Acute encephalopathy   Bipolar disorder (HCC)   Seizure (HCC)   Encephalopathy   Cellulitis   Severe sepsis with septic shock (HCC)   AKI (acute kidney injury) (HCC)   Thrombocytopenia (HCC)   Hypokalemia   Anemia   Acute encephalopathy -Related to seizures, sepsis -Resolved.  Septic shock secondary to left lower extremity cellulitis -Patient was noted to be febrile, tachypneic on admission, he had elevated lactic acid up to 5.1, also had acute kidney injury -He required Levophed on admission, this has since been weaned off. -He was treated with broad-spectrum antibiotics and is currently on cefepime since 6/25. -His blood cultures have not shown any growth -CT scan performed of his left leg on admission did not show any evidence of necrotizing fasciitis -Venous Dopplers negative for DVT -Overall cellulitis features have almost resolved.  Has a large water blister on the medial aspect of the left lower leg which should spontaneously rupture. -May either DC all  antibiotics at time of discharge or a couple days of oral antibiotics.  Acute respiratory failure with hypoxia -Related to seizures/sepsis -Required nonrebreather on admission since he was hypoxic in the 80s on room air -Subsequently required intubation with mechanical ventilation -He has since been extubated and hypoxia has resolved.  Known seizure disorder with status epilepticus -Presented with persistent seizures on admission -Followed by neurology -EEG did not show any epileptiform discharges -It was felt that his underlying infection may have lowered his seizure threshold -Currently he is on his chronic seizure medications and appears to be stable -No further seizures.  Driving restrictions counseled.  Patient reports that he does not drive.  Acute kidney injury -Secondary to volume depletion/sepsis -Creatinine noted to be 2.2 on admission. Resolved.  Thrombocytopenia -Suspect is related to sepsis -No signs of bleeding -Hold further subcutaneous heparin -Improving. F/U CBC in am.  Anemia -Suspect this is related to critical illness -No evidence of bleeding -Hemoglobin has been stable -Continue to monitor  Bipolar/schizophrenia -On Abilify, Lexapro and Cogentin.  Has auditory but no visual hallucinations.  No suicidal or homicidal ideations.  Follows with Dr. Glennon HamiltonAndrew Metrum, Psychiatry.  Acute left clavicular fracture status post fall -Seen by orthopedics -Recommendations for nonoperative management with nonweightbearing status and sling, motion is okay. -Follow-up with Dr. Eulah PontMurphy in 2 weeks  History of migraines -Chronically on propranolol which has been held due to bradycardia  Generalized weakness -OT recommends no outpatient follow-up.  PT input is still pending.   DVT prophylaxis: SCDs Start: 11/26/20 2306  Code Status: full code Family Communication: discussed with patient.  None at bedside. Disposition Plan: Status  is: Inpatient  Remains inpatient  appropriate because:IV treatments appropriate due to intensity of illness or inability to take PO and Inpatient level of care appropriate due to severity of illness  Dispo: The patient is from: Group home              Anticipated d/c is to: Group home              Patient currently is not medically stable to d/c.   Difficult to place patient No         Consultants:  PCCM Neurology  Procedures:  ETT 6/25>6/26 EEG: negative for epileptiform discharges Echo: EF 60-65% Venous doppler LLE: neg for DVT  Antimicrobials:  Cefepime 6/25 >   Subjective: Patient reports history of frequent seizures.  Does not drive.  History of chronic auditory hallucinations where he hears dead people talking to him but denies that they say him to do anything bad or things to hurt himself.  Denies suicidal or homicidal ideations.  Denies visual hallucinations.  Follows with Dr. Glennon Hamilton, Psychiatry.  Objective: Vitals:   11/30/20 0416 11/30/20 1011 11/30/20 1250 11/30/20 1629  BP: 108/61 97/62 96/72  (!) 97/55  Pulse: (!) 55 62 61 63  Resp: 17 16 18 16   Temp: 97.8 F (36.6 C) 98.7 F (37.1 C) 98 F (36.7 C) 98.7 F (37.1 C)  TempSrc:  Oral  Oral  SpO2: 95% 99% 100% 100%  Weight:        Intake/Output Summary (Last 24 hours) at 11/30/2020 1657 Last data filed at 11/30/2020 1200 Gross per 24 hour  Intake 360 ml  Output 3000 ml  Net -2640 ml   Filed Weights   11/28/20 0454 11/29/20 0500  Weight: 80 kg 81.5 kg    Examination:  General exam: Pleasant young male, moderately built and nourished lying comfortably propped up in bed without distress. Respiratory system: Clear to auscultation. Respiratory effort normal. Cardiovascular system: S1 and S2 heard, RRR.  No JVD, murmurs or pedal edema.  Telemetry personally reviewed: Sinus rhythm. Gastrointestinal system: Abdomen is nondistended, soft and nontender. No organomegaly or masses felt. Normal bowel sounds heard. Central nervous  system: Alert and oriented. No focal neurological deficits. Extremities: Symmetric 5 x 5 power. Skin: Left lower extremity, medial aspect, cellulitis features/erythema has almost resolved.  Picture from below is not from today.  Still has the large water blister. Psychiatry: Judgement and insight appear normal. Mood & affect appropriate.        Data Reviewed: I have personally reviewed following labs and imaging studies  CBC: Recent Labs  Lab 11/26/20 1654 11/27/20 0115 11/27/20 0221 11/28/20 0522 11/29/20 0350 11/30/20 0352  WBC 5.0  --  7.8 5.9 6.8 6.5  NEUTROABS 2.1  --  5.0  --   --   --   HGB 11.7* 10.2* 11.1* 9.5* 9.3* 10.0*  HCT 33.5* 30.0* 31.8* 28.0* 27.3* 29.1*  MCV 91.5  --  91.4 94.0 94.5 91.8  PLT 131*  --  124* PLATELET CLUMPS NOTED ON SMEAR, COUNT APPEARS DECREASED 76* 97*   Basic Metabolic Panel: Recent Labs  Lab 11/26/20 1654 11/27/20 0115 11/27/20 0221 11/28/20 0522 11/29/20 0350 11/30/20 0352  NA 136 139 139 139 139 141  K 4.8 3.4* 3.3* 4.1 3.2* 3.5  CL 102  --  104 106 106 109  CO2 22  --  20* 25 25 28   GLUCOSE 95  --  183* 108* 82 95  BUN 40*  --  28* 12  8 <5*  CREATININE 2.21*  --  1.57* 0.77 0.61 0.57*  CALCIUM 8.6*  --  7.9* 8.2* 8.3* 8.4*  MG  --   --  1.8  --   --  1.7  PHOS  --   --  3.5  --   --   --    GFR: Estimated Creatinine Clearance: 129.3 mL/min (A) (by C-G formula based on SCr of 0.57 mg/dL (L)). Liver Function Tests: Recent Labs  Lab 11/26/20 1654 11/27/20 0221  AST 51* 64*  ALT 22 27  ALKPHOS 92 78  BILITOT 1.3* 1.4*  PROT 6.2* 5.3*  ALBUMIN 3.2* 2.8*    Coagulation Profile: Recent Labs  Lab 11/27/20 0221  INR 1.2   Cardiac Enzymes: Recent Labs  Lab 11/26/20 1714 11/27/20 1527 11/28/20 0522  CKTOTAL 609* 1,825* 884*    CBG: Recent Labs  Lab 11/27/20 0328 11/27/20 0759 11/27/20 1136 11/27/20 1545 11/27/20 1927  GLUCAP 190* 136* 163* 156* 113*   Lipid Profile: Recent Labs    11/30/20 0352   TRIG 122    Recent Results (from the past 240 hour(s))  Blood culture (routine x 2)     Status: None (Preliminary result)   Collection Time: 11/26/20  4:51 PM   Specimen: BLOOD  Result Value Ref Range Status   Specimen Description BLOOD RIGHT ANTECUBITAL  Final   Special Requests   Final    BOTTLES DRAWN AEROBIC AND ANAEROBIC Blood Culture adequate volume   Culture   Final    NO GROWTH 4 DAYS Performed at [facility], [address], [place], [place] [address]    Report Status PENDING  Incomplete  Blood culture (routine x 2)     Status: None (Preliminary result)   Collection Time: 11/26/20  4:51 PM   Specimen: BLOOD  Result Value Ref Range Status   Specimen Description BLOOD RIGHT HAND  Final   Special Requests   Final    BOTTLES DRAWN AEROBIC AND ANAEROBIC Blood Culture results may not be optimal due to an excessive volume of blood received in culture bottles   Culture   Final    NO GROWTH 4 DAYS Performed at [facility], [address], [place], [place] [address]    Report Status PENDING  Incomplete  Resp Panel by RT-PCR (Flu A&B, Covid) Nasopharyngeal Swab     Status: None   Collection Time: 11/26/20  4:51 PM   Specimen: Nasopharyngeal Swab; Nasopharyngeal(NP) swabs in vial transport medium  Result Value Ref Range Status   SARS Coronavirus 2 by RT PCR NEGATIVE NEGATIVE Final    Comment: (NOTE) SARS-CoV-2 target nucleic acids are NOT DETECTED.  The SARS-CoV-2 RNA is generally detectable in upper respiratory specimens during the acute phase of infection. The lowest concentration of SARS-CoV-2 viral copies this assay can detect is 138 copies/mL. A negative result does not preclude SARS-Cov-2 infection and should not be used as the sole basis for treatment or other patient management decisions. A negative result may occur with  improper specimen collection/handling, submission of specimen other than nasopharyngeal swab, presence of viral mutation(s)  within the areas targeted by this assay, and inadequate number of viral copies(<138 copies/mL). A negative result must be combined with clinical observations, patient history, and epidemiological information. The expected result is Negative.  Fact Sheet for Patients:  BloggerCourse.com  Fact Sheet for Healthcare Providers:  SeriousBroker.it  This test is no t yet approved or cleared by the [place] FDA and  has  been authorized for detection and/or diagnosis of SARS-CoV-2 by FDA under an Emergency Use Authorization (EUA). This EUA will remain  in effect (meaning this test can be used) for the duration of the COVID-19 declaration under Section 564(b)(1) of the Act, 21 U.S.C.section 360bbb-3(b)(1), unless the authorization is terminated  or revoked sooner.       Influenza A by PCR NEGATIVE NEGATIVE Final   Influenza B by PCR NEGATIVE NEGATIVE Final    Comment: (NOTE) The Xpert Xpress SARS-CoV-2/FLU/RSV plus assay is intended as an aid in the diagnosis of influenza from Nasopharyngeal swab specimens and should not be used as a sole basis for treatment. Nasal washings and aspirates are unacceptable for Xpert Xpress SARS-CoV-2/FLU/RSV testing.  Fact Sheet for Patients: BloggerCourse.com  Fact Sheet for Healthcare Providers: SeriousBroker.it  This test is not yet approved or cleared by the [place] FDA and has been authorized for detection and/or diagnosis of SARS-CoV-2 by FDA under an Emergency Use Authorization (EUA). This EUA will remain in effect (meaning this test can be used) for the duration of the COVID-19 declaration under Section 564(b)(1) of the Act, 21 U.S.C. section 360bbb-3(b)(1), unless the authorization is terminated or revoked.  Performed at [facility], [address] [place], [place] [address]   MRSA Next Gen by PCR, Nasal     Status: None    Collection Time: 11/26/20 11:11 PM   Specimen: Nasal Mucosa; Nasal Swab  Result Value Ref Range Status   MRSA by PCR Next Gen NOT DETECTED NOT DETECTED Final    Comment: (NOTE) The GeneXpert MRSA Assay (FDA approved for NASAL specimens only), is one component of a comprehensive MRSA colonization surveillance program. It is not intended to diagnose MRSA infection nor to guide or monitor treatment for MRSA infections. Test performance is not FDA approved in patients less than 17 years old. Performed at [facility], [address] [place], [place] [address]   Culture, Respiratory w Gram Stain (tracheal aspirate)     Status: None   Collection Time: 11/27/20  1:08 AM   Specimen: Tracheal Aspirate; Respiratory  Result Value Ref Range Status   Specimen Description TRACHEAL ASPIRATE  Final   Special Requests NONE  Final   Gram Stain   Final    FEW WBC PRESENT,BOTH PMN AND MONONUCLEAR ABUNDANT GRAM NEGATIVE RODS ABUNDANT GRAM POSITIVE COCCI IN PAIRS IN CLUSTERS FEW GRAM POSITIVE RODS FEW YEAST    Culture   Final    ABUNDANT Consistent with normal respiratory flora. No Pseudomonas species isolated Performed at [facility], [address] [place], [place] [address]    Report Status 11/29/2020 FINAL  Final         Radiology Studies: No results found.      Scheduled Meds:  ARIPiprazole  5 mg Oral Daily   benztropine  2 mg Oral TID   chlorhexidine  15 mL Mouth Rinse BID   Chlorhexidine Gluconate Cloth  6 each Topical Daily   escitalopram  20 mg Oral Daily   gabapentin  600 mg Oral TID   mouth rinse  15 mL Mouth Rinse q12n4p   Continuous Infusions:  ceFEPime (MAXIPIME) IV 2 g (11/30/20 1623)   lacosamide (VIMPAT) IV 150 mg (11/30/20 1151)   lactated ringers 100 mL/hr at 11/29/20 2128   levETIRAcetam 500 mg (11/30/20 1237)   valproate sodium 500 mg (11/30/20 1503)     LOS: 4 days    Time spent:   Xana Bradt,  MD, FACP, [facility]. [facility]  To contact the attending provider between 7A-7P or the covering provider during after hours 7P-7A, please log into the web site [email or web address] and access using universal [place] password for that web site. If you do not have the password, please call the hospital operator.

## 2020-11-30 NOTE — Evaluation (Signed)
Occupational Therapy Evaluation Patient Details Name: Russell Buchanan MRN: [id number] DOB: 07/23/1975 Today's Date: 11/30/2020    History of Present Illness 45 y.o. male admitted s/p fall outside of his group home.  PMH includes: hx of falls, Antisocial personality disorder, Bipolar disorder, GERD, Hyperlipidemia, Schizophrenia, Seizures, Tremors of nervous system.   Clinical Impression   Patient admitted for the diagnosis above.  He did sustain a L clavicular fracture.  Per ortho note: NWB to LUE, no restrictions to ROM to LUE, and sling for comfort.  Patient is needing some additional assist for UB ADL due to L clavicle fx, but appears to be moving close to baseline.  Patient states he's a little slow to move, and does have a history of falls.  OT recommends a return to his group home, as he is incapable to caring for himself without oversight and cueing.  Patient is not following WBS to his L arm despite frequent cues, and removes sling due to preference.  OT will follow in the acute setting to reinforce precautions and maximize function.      Follow Up Recommendations  No OT follow up    Equipment Recommendations  None recommended by OT    Recommendations for Other Services       Precautions / Restrictions Precautions Precautions: Fall Required Braces or Orthoses: Sling Restrictions Weight Bearing Restrictions: Yes LUE Weight Bearing: Non weight bearing Other Position/Activity Restrictions: sling for comfort and no limitations for AROM to LUE.      Mobility Bed Mobility Overal bed mobility: Modified Independent               Patient Response: Flat affect  Transfers Overall transfer level: Needs assistance   Transfers: Sit to/from Stand;Stand Pivot Transfers Sit to Stand: Min guard Stand pivot transfers: Min guard       General transfer comment: no LOB noted.  Moves slowly    Balance Overall balance assessment: Needs assistance Sitting-balance support: Feet  supported;No upper extremity supported Sitting balance-Leahy Scale: Good     Standing balance support: No upper extremity supported Standing balance-Leahy Scale: Fair Standing balance comment: hunched posture and slow movements.  Hx of falls                           ADL either performed or assessed with clinical judgement   ADL Overall ADL's : Needs assistance/impaired Eating/Feeding: Independent   Grooming: Wash/dry hands;Wash/dry face;Set up;Sitting           Upper Body Dressing : Minimal assistance;Sitting Upper Body Dressing Details (indicate cue type and reason): assist to bring around his shoulders Lower Body Dressing: Supervision/safety;Sitting/lateral leans               Functional mobility during ADLs: Min guard General ADL Comments: no assistive device - hunched posture     Vision Patient Visual Report: No change from baseline       Perception     Praxis      Pertinent Vitals/Pain Pain Assessment: Faces Faces Pain Scale: Hurts little more Pain Location: L shoulder Pain Descriptors / Indicators: Tender Pain Intervention(s): Monitored during session     Hand Dominance Right   Extremity/Trunk Assessment Upper Extremity Assessment Upper Extremity Assessment: Generalized weakness;LUE deficits/detail LUE Deficits / Details: decreased end range for shoulder flexion. LUE: Unable to fully assess due to pain   Lower Extremity Assessment Lower Extremity Assessment: Defer to PT evaluation   Cervical / Trunk Assessment Cervical /  Trunk Assessment: Kyphotic   Communication Communication Communication: No difficulties   Cognition Arousal/Alertness: Awake/alert Behavior During Therapy: WFL for tasks assessed/performed;Flat affect Overall Cognitive Status: History of cognitive impairments - at baseline                                 General Comments: follows commands well, mentally slow due to premorbid cognitive/mental  disorder.   General Comments       Exercises     Shoulder Instructions      Home Living Family/patient expects to be discharged to:: Group home                                        Prior Functioning/Environment Level of Independence: Independent        Comments: Patient admits to multiple falls and seizure like activity/spells.  Patient does not use an AD to walk, and is able to perform ADL with what sounds like cueing.  States he is reminded to bathe and groom, or else he doesn't worry about it.        OT Problem List: Decreased strength;Decreased range of motion;Impaired balance (sitting and/or standing);Impaired UE functional use      OT Treatment/Interventions: Self-care/ADL training;Therapeutic exercise;Balance training;Therapeutic activities    OT Goals(Current goals can be found in the care plan section) Acute Rehab OT Goals Patient Stated Goal: Go back to my residence OT Goal Formulation: With patient Time For Goal Achievement: 12/14/20 Potential to Achieve Goals: Good ADL Goals Pt Will Perform Lower Body Bathing: Independently;sit to/from stand Pt Will Perform Lower Body Dressing: Independently;sit to/from stand Pt Will Transfer to Toilet: Independently;ambulating;regular height toilet  OT Frequency: Min 2X/week   Barriers to D/C:    none noted       Co-evaluation              AM-PAC OT "6 Clicks" Daily Activity     Outcome Measure Help from another person eating meals?: None Help from another person taking care of personal grooming?: None Help from another person toileting, which includes using toliet, bedpan, or urinal?: None Help from another person bathing (including washing, rinsing, drying)?: A Little Help from another person to put on and taking off regular upper body clothing?: A Little Help from another person to put on and taking off regular lower body clothing?: A Little 6 Click Score: 21   End of Session Equipment  Utilized During Treatment: Other (comment) (sling removed by patient.) Nurse Communication: Mobility status;Weight bearing status  Activity Tolerance: Patient tolerated treatment well Patient left: in chair;with call bell/phone within reach;with chair alarm set;with nursing/sitter in room  OT Visit Diagnosis: Unsteadiness on feet (R26.81);Repeated falls (R29.6);Muscle weakness (generalized) (M62.81);Other symptoms and signs involving cognitive function                Time: 4008-6761 OT Time Calculation (min): 25 min Charges:  OT General Charges $OT Visit: 1 Visit OT Evaluation $OT Eval Moderate Complexity: 1 Mod OT Treatments $Self Care/Home Management : 8-22 mins  11/30/2020  Rich, OTR/L  Acute Rehabilitation Services  Office:  [phone]   Suzanna Obey 11/30/2020, 1:11 PM

## 2020-11-30 NOTE — Progress Notes (Signed)
Pharmacy Antibiotic Note  Russell Buchanan is a 45 y.o. male admitted on 11/26/2020 with sepsis. Pharmacy has been consulted for Vanco, Cefepime dosing. Vancomycin now off, cultures negative, AKI resolved.  Plan: -Continue cefepime 2g IV q8h -F/U LOT, can probably stop ABX soon   Weight: 81.5 kg (179 lb 10.8 oz)  Temp (24hrs), Avg:98.3 F (36.8 C), Min:97.8 F (36.6 C), Max:98.7 F (37.1 C)  Recent Labs  Lab 11/26/20 1651 11/26/20 1654 11/26/20 1731 11/27/20 0221 11/27/20 0417 11/28/20 0522 11/29/20 0350 11/30/20 0352  WBC  --  5.0  --  7.8  --  5.9 6.8 6.5  CREATININE  --  2.21*  --  1.57*  --  0.77 0.61 0.57*  LATICACIDVEN 2.8*  --  3.7* 5.1* 5.6*  --   --   --      Estimated Creatinine Clearance: 129.3 mL/min (A) (by C-G formula based on SCr of 0.57 mg/dL (L)).    Allergies  Allergen Reactions   Bee Venom Hives    Fredonia Highland, PharmD, [place], [facility] Clinical Pharmacist [phone] Please check AMION for all [facility] Pharmacy numbers 11/30/2020

## 2020-11-30 NOTE — Evaluation (Signed)
Physical Therapy Evaluation Patient Details Name: Russell Buchanan MRN: [id number] DOB: 10-Apr-1976 Today's Date: 11/30/2020   History of Present Illness  45 y.o. male admitted s/p fall outside of his group home.  PMH includes: hx of falls, Antisocial personality disorder, Bipolar disorder, GERD, Hyperlipidemia, Schizophrenia, Seizures, Tremors of nervous system.  Clinical Impression  PTA, pt lives in a group home and is independent, however, he has a history of frequent falling. Pt ambulating x 150 feet with no assistive device at a min assist level. Negotiated 3 steps with railings at a min guard assist level. Pt displays gait abnormalities, balance deficits, decreased gait speed. Of note, pt also reporting "dizziness," with movement which lasts minutes in duration. Noted to have dysconjugate gaze and right beating nystagmus. Pt does not follow weightbearing precautions to LUE despite cueing. Would benefit from follow up PT to address deficits and maximize functional mobility.     Follow Up Recommendations Home health PT v Outpatient PT;Supervision for mobility/OOB (CM Kelli going to check into transportation options)    Equipment Recommendations  Cane    Recommendations for Other Services       Precautions / Restrictions Precautions Precautions: Fall Restrictions Weight Bearing Restrictions: Yes LUE Weight Bearing: Non weight bearing Other Position/Activity Restrictions: sling for comfort and no limitations for AROM to LUE.      Mobility  Bed Mobility Overal bed mobility: Modified Independent                  Transfers Overall transfer level: Needs assistance Equipment used: None Transfers: Sit to/from Stand Sit to Stand: Min guard Stand pivot transfers: Min guard       General transfer comment: min guard to rise  Ambulation/Gait Ambulation/Gait assistance: Min assist Gait Distance (Feet): 150 Feet Assistive device: None Gait Pattern/deviations: Step-through  pattern;Trunk flexed;Decreased dorsiflexion - right;Decreased dorsiflexion - left Gait velocity: decreased Gait velocity interpretation: <1.8 ft/sec, indicate of risk for recurrent falls General Gait Details: Kyphotic posture, decreased bilateral foot clearance. Cues for looking up. Pt hugging too close to left wall and encouraged to walk in center of hallway. MinA to steady with balance challenge or turning  Stairs Stairs: Yes Stairs assistance: Min guard Stair Management: Two rails;One rail Right;Sideways;Forwards Number of Stairs: 3 General stair comments: Pt ascending forwards with bilateral rails, then descending with right rail going sideways. min guard for safety  Wheelchair Mobility    Modified Rankin (Stroke Patients Only)       Balance Overall balance assessment: Needs assistance Sitting-balance support: Feet supported;No upper extremity supported Sitting balance-Leahy Scale: Good     Standing balance support: No upper extremity supported;During functional activity Standing balance-Leahy Scale: Fair Standing balance comment: hunched posture and slow movements.  Hx of falls                             Pertinent Vitals/Pain Pain Assessment: No/denies pain Faces Pain Scale: Hurts little more Pain Location: L shoulder Pain Descriptors / Indicators: Tender Pain Intervention(s): Monitored during session    Home Living Family/patient expects to be discharged to:: Group home                      Prior Function Level of Independence: Independent         Comments: Patient admits to multiple falls and seizure like activity/spells.  Patient does not use an AD to walk, and is able to perform ADL with what sounds  like cueing.  States he is reminded to bathe and groom, or else he doesn't worry about it.     Hand Dominance   Dominant Hand: Right    Extremity/Trunk Assessment   Upper Extremity Assessment Upper Extremity Assessment: Defer to OT  evaluation LUE Deficits / Details: decreased end range for shoulder flexion. LUE: Unable to fully assess due to pain    Lower Extremity Assessment Lower Extremity Assessment: Generalized weakness;RLE deficits/detail;LLE deficits/detail RLE Sensation: decreased light touch LLE Sensation: decreased light touch    Cervical / Trunk Assessment Cervical / Trunk Assessment: Kyphotic  Communication   Communication: No difficulties  Cognition Arousal/Alertness: Awake/alert Behavior During Therapy: WFL for tasks assessed/performed;Flat affect Overall Cognitive Status: History of cognitive impairments - at baseline                                 General Comments: follows commands well, mentally slow due to premorbid cognitive/mental disorder.      General Comments      Exercises     Assessment/Plan    PT Assessment Patient needs continued PT services  PT Problem List Decreased strength;Decreased activity tolerance;Decreased balance;Decreased mobility       PT Treatment Interventions DME instruction;Gait training;Therapeutic activities;Functional mobility training;Therapeutic exercise;Stair training;Balance training;Patient/family education    PT Goals (Current goals can be found in the Care Plan section)  Acute Rehab PT Goals Patient Stated Goal: Go back to my residence PT Goal Formulation: With patient Time For Goal Achievement: 12/14/20 Potential to Achieve Goals: Good    Frequency Min 3X/week   Barriers to discharge        Co-evaluation               AM-PAC PT "6 Clicks" Mobility  Outcome Measure Help needed turning from your back to your side while in a flat bed without using bedrails?: None Help needed moving from lying on your back to sitting on the side of a flat bed without using bedrails?: None Help needed moving to and from a bed to a chair (including a wheelchair)?: A Little Help needed standing up from a chair using your arms (e.g.,  wheelchair or bedside chair)?: A Little Help needed to walk in hospital room?: A Little Help needed climbing 3-5 steps with a railing? : A Little 6 Click Score: 20    End of Session Equipment Utilized During Treatment: Gait belt Activity Tolerance: Patient tolerated treatment well Patient left: in chair;with call bell/phone within reach;with chair alarm set;with nursing/sitter in room Nurse Communication: Mobility status PT Visit Diagnosis: Unsteadiness on feet (R26.81);History of falling (Z91.81)    Time: 9798-9211 PT Time Calculation (min) (ACUTE ONLY): 25 min   Charges:   PT Evaluation $PT Eval Moderate Complexity: 1 Mod PT Treatments $Gait Training: 8-22 mins        Lillia Pauls, PT, DPT Acute Rehabilitation Services Pager [phone] Office [phone] Lillia Pauls, [place], DPT Acute Rehabilitation Services Pager [phone] Office [phone]   Norval Morton 11/30/2020, 5:02 PM

## 2020-12-01 DIAGNOSIS — D696 Thrombocytopenia, unspecified: Secondary | ICD-10-CM

## 2020-12-01 DIAGNOSIS — A419 Sepsis, unspecified organism: Principal | ICD-10-CM

## 2020-12-01 DIAGNOSIS — R6521 Severe sepsis with septic shock: Secondary | ICD-10-CM

## 2020-12-01 DIAGNOSIS — S42002D Fracture of unspecified part of left clavicle, subsequent encounter for fracture with routine healing: Secondary | ICD-10-CM

## 2020-12-01 LAB — CBC
HCT: 27.2 % — ABNORMAL LOW (ref 39.0–52.0)
Hemoglobin: 9.6 g/dL — ABNORMAL LOW (ref 13.0–17.0)
MCH: 32 pg (ref 26.0–34.0)
MCHC: 35.3 g/dL (ref 30.0–36.0)
MCV: 90.7 fL (ref 80.0–100.0)
Platelets: 130 10*3/uL — ABNORMAL LOW (ref 150–400)
RBC: 3 MIL/uL — ABNORMAL LOW (ref 4.22–5.81)
RDW: 13.5 % (ref 11.5–15.5)
WBC: 5.4 10*3/uL (ref 4.0–10.5)
nRBC: 0 % (ref 0.0–0.2)

## 2020-12-01 LAB — COMPREHENSIVE METABOLIC PANEL
ALT: 18 U/L (ref 0–44)
AST: 20 U/L (ref 15–41)
Albumin: 2 g/dL — ABNORMAL LOW (ref 3.5–5.0)
Alkaline Phosphatase: 65 U/L (ref 38–126)
Anion gap: 6 (ref 5–15)
BUN: 8 mg/dL (ref 6–20)
CO2: 27 mmol/L (ref 22–32)
Calcium: 8.4 mg/dL — ABNORMAL LOW (ref 8.9–10.3)
Chloride: 106 mmol/L (ref 98–111)
Creatinine, Ser: 0.56 mg/dL — ABNORMAL LOW (ref 0.61–1.24)
GFR, Estimated: >60 mL/min (ref >60)
Glucose, Bld: 103 mg/dL — ABNORMAL HIGH (ref 70–99)
Potassium: 3.5 mmol/L (ref 3.5–5.1)
Sodium: 139 mmol/L (ref 135–145)
Total Bilirubin: 0.4 mg/dL (ref 0.3–1.2)
Total Protein: 4 g/dL — ABNORMAL LOW (ref 6.5–8.1)

## 2020-12-01 LAB — CULTURE, BLOOD (ROUTINE X 2)
Culture: NO GROWTH
Culture: NO GROWTH
Special Requests: ADEQUATE

## 2020-12-01 LAB — CK: Total CK: 119 U/L (ref 49–397)

## 2020-12-01 MED ORDER — LACOSAMIDE 150 MG PO TABS: 150.0000 mg | ORAL_TABLET | Freq: Two times a day (BID) | ORAL | 0 refills | Status: AC

## 2020-12-01 MED ORDER — DIVALPROEX SODIUM 500 MG PO DR TAB: 500.0000 mg | DELAYED_RELEASE_TABLET | Freq: Three times a day (TID) | ORAL | 0 refills | Status: AC

## 2020-12-01 MED ORDER — DIVALPROEX SODIUM 250 MG PO DR TAB
500.0000 mg | DELAYED_RELEASE_TABLET | Freq: Three times a day (TID) | ORAL | Status: DC
  Administered 2020-12-01: 500 mg via ORAL
  Filled 2020-12-01: qty 2

## 2020-12-01 MED ORDER — GABAPENTIN 600 MG PO TABS: 600.0000 mg | ORAL_TABLET | Freq: Three times a day (TID) | ORAL | 0 refills | Status: AC

## 2020-12-01 MED ORDER — POTASSIUM CHLORIDE CRYS ER 20 MEQ PO TBCR
40.0000 meq | EXTENDED_RELEASE_TABLET | Freq: Once | ORAL | Status: AC
  Administered 2020-12-01: 40 meq via ORAL
  Filled 2020-12-01: qty 2

## 2020-12-01 MED ORDER — LEVETIRACETAM 500 MG PO TABS
500.0000 mg | ORAL_TABLET | Freq: Two times a day (BID) | ORAL | Status: DC
  Administered 2020-12-01: 500 mg via ORAL
  Filled 2020-12-01: qty 1

## 2020-12-01 MED ORDER — LACOSAMIDE 50 MG PO TABS
150.0000 mg | ORAL_TABLET | Freq: Two times a day (BID) | ORAL | Status: DC
  Administered 2020-12-01: 150 mg via ORAL
  Filled 2020-12-01: qty 3

## 2020-12-01 NOTE — Discharge Instructions (Signed)

## 2020-12-01 NOTE — Progress Notes (Signed)
Wheeled off unit by this RN all pt belongings where in his lap.

## 2020-12-01 NOTE — Discharge Summary (Signed)
Physician Discharge Summary  Neema Fluegge ZOX:[id number] DOB: 01-Jan-1976  PCP: Koren Bound, NP  Admitted from: Group home Discharged to: Group home  Admit date: 11/26/2020 Discharge date: 12/01/2020  Recommendations for Outpatient Follow-up:    Follow-up Information     [facility] Follow up.   Specialty: Home Health Services Why: The home health agency will contact you for the first home visit Contact information: [address] [place] [address] [phone]         Weston Anna, PA-C. Schedule an appointment as soon as possible for a visit in 1 week(s).   Specialty: Neurology Why: Hospital follow up.  Kindly take all your medications for provider to review during this visit. Contact information: [address] [place] [address] [phone]         Koren Bound, NP. Schedule an appointment as soon as possible for a visit in 1 week(s).   Specialty: Nurse Practitioner Why: To be seen with repeat labs (CBC, CMP).  Kindly take all your medications for provider to review during this visit. Contact information: [address] [place] [facility] [address] [phone]         Sheral Apley, MD. Schedule an appointment as soon as possible for a visit in 2 week(s).   Specialty: Orthopedic Surgery Contact information: [address] [place] [address] [phone]                  Home Health: Home health PT versus outpatient PT    Equipment/Devices:     Durable Medical Equipment  (From admission, onward)           Start     Ordered   12/01/20 1335  For home use only DME Cane  Once        12/01/20 1334             Discharge Condition: Improved and stable   Code Status: Full Code Diet recommendation:  Discharge Diet Orders (From admission, onward)     Start     Ordered   12/01/20 0000  Diet - low sodium heart healthy        12/01/20 1505              Discharge Diagnoses:  Principal Problem:   Clavicle fracture Active Problems:   Schizophrenia (HCC)   Acute encephalopathy   Bipolar disorder (HCC)   Seizure (HCC)   Encephalopathy   Cellulitis   Severe sepsis with septic shock (HCC)   AKI (acute kidney injury) (HCC)   Thrombocytopenia (HCC)   Hypokalemia   Anemia   Brief Summary: 45 year old male with history of seizure disorder, bipolar and schizophrenia, presented to [facility] with respiratory failure, septic shock and status epilepticus.  He was admitted in the emergency room and started on vasopressors.  Transferred to [facility], ICU for further management.  He was evaluated by Neurology who felt that his seizure threshold may have been lowered due to infection.  He was continued on his home seizure medications.  Patient was able to extubate the following day after admission and vasopressors have since been weaned off.  Cellulitis has almost resolved.       Assessment & Plan:    Acute encephalopathy -Related to seizures, sepsis -Resolved.   Septic shock secondary to left lower extremity cellulitis -Patient was noted to be febrile, tachypneic on admission, he had elevated lactic acid up to 5.1, also had acute kidney injury -He  required Levophed on admission, this has since been weaned off. -He was treated with broad-spectrum antibiotics and is currently on cefepime since 6/25. -His blood cultures have not shown any growth -CT scan performed of his left leg on admission did not show any evidence of necrotizing fasciitis -Venous Dopplers negative for DVT -Overall cellulitis has clinically resolved and leftover findings are likely related to skin changes related to cellulitis which may take several days to resolve.  The large fluid-filled blister from yesterday has spontaneously ruptured without acute findings. -Has completed total 6 days of IV antibiotics.  Completed total course.   Acute respiratory failure with  hypoxia -Related to seizures/sepsis -Required nonrebreather on admission since he was hypoxic in the 80s on room air -Subsequently required intubation with mechanical ventilation -He has since been extubated and hypoxia has resolved.   Known seizure disorder with status epilepticus -Presented with persistent seizures on admission -Followed by neurology -EEG did not show any epileptiform discharges -It was felt that his underlying infection may have lowered his seizure threshold -Currently he is on his chronic seizure medications and appears to be stable -No further seizures.  Driving restrictions counseled.  Patient reports that he does not drive. -I communicated with neuro hospitalist on call today who advised that patient can be discharged home on below AEDs with adjusted doses.  He recommended stopping Keppra.  Patient is advised to follow-up with his primary outpatient neurologist.   Acute kidney injury -Secondary to volume depletion/sepsis -Creatinine noted to be 2.2 on admission. Resolved.  Unclear as to why he was on Lasix and salt tablets prior to admission, discontinued at discharge.  Close outpatient follow-up with PCP to determine if these need to be resumed.   Thrombocytopenia -Suspect is related to sepsis -No signs of bleeding -Hold further subcutaneous heparin -Improving.  Follow CBC as outpatient.   Anemia -Suspect this is related to critical illness -No evidence of bleeding -Hemoglobin has been stable - follow CBC as outpatient   Bipolar/schizophrenia -On Abilify, Lexapro and Cogentin.  Has auditory but no visual hallucinations.  No suicidal or homicidal ideations.  Follows with Koren Bound, NP for same.  Patient has been advised to take all his medications during next office visit with this provider for careful review.  He was on several medications that he seemed to have voluntarily stopped taking.  There were other medications listed on his list which he  probably could not procure due to insurance issues and hence was not taking.  Provider to carefully review his med list below and to decide if some of the stop medicines need to be resumed.   Acute left clavicular fracture status post fall -Seen by orthopedics -Recommendations for nonoperative management with nonweightbearing status and sling, motion is okay.  Patient has been counseled regarding nonweightbearing status on his left upper extremity.  He verbalizes understanding -Follow-up with Dr. Eulah Pont in 2 weeks   History of migraines -Chronically on propranolol which had been held due to asymptomatic bradycardia.  Resumed at discharge.  Outpatient follow-up with neurology.   Generalized weakness Therapies recommendations as above.   Dyslipidemia Continue pravastatin and gemfibrozil which she seems to be tolerating as outpatient       Consultants:  PCCM Neurology Orthopedics.   Procedures:  ETT 6/25>6/26 EEG: negative for epileptiform discharges Echo: EF 60-65% Venous doppler LLE: neg for DVT   Discharge Instructions  Discharge Instructions     Call MD for:   Complete by: As directed    Recurrent  seizures.   Call MD for:  difficulty breathing, headache or visual disturbances   Complete by: As directed    Call MD for:  extreme fatigue   Complete by: As directed    Call MD for:  persistant dizziness or light-headedness   Complete by: As directed    Call MD for:  persistant nausea and vomiting   Complete by: As directed    Call MD for:  redness, tenderness, or signs of infection (pain, swelling, redness, odor or green/yellow discharge around incision site)   Complete by: As directed    Call MD for:  severe uncontrolled pain   Complete by: As directed    Call MD for:  temperature >100.4   Complete by: As directed    Diet - low sodium heart healthy   Complete by: As directed    Discharge instructions   Complete by: As directed    Per [facility] statutes,  patients with seizures are not allowed to drive until they have been seizure-free for six months.  Use caution when using heavy equipment or power tools. Avoid working on ladders or at heights. Take showers instead of baths. Ensure the water temperature is not too high on the home water heater. Do not go swimming alone. Do not lock yourself in a room alone (i.e. bathroom). When caring for infants or small children, sit down when holding, feeding, or changing them to minimize risk of injury to the child in the event you have a seizure. Maintain good sleep hygiene. Avoid alcohol.   Driving Restrictions   Complete by: As directed    Per [facility] statutes, patients with seizures are not allowed to drive until they have been seizure-free for six months.  Use caution when using heavy equipment or power tools. Avoid working on ladders or at heights. Take showers instead of baths. Ensure the water temperature is not too high on the home water heater. Do not go swimming alone. Do not lock yourself in a room alone (i.e. bathroom). When caring for infants or small children, sit down when holding, feeding, or changing them to minimize risk of injury to the child in the event you have a seizure. Maintain good sleep hygiene. Avoid alcohol.   Increase activity slowly   Complete by: As directed         Medication List     STOP taking these medications    Fremanezumab-vfrm 225 MG/1.5ML Sosy   furosemide 40 MG tablet Commonly known as: Lasix   ibuprofen 600 MG tablet Commonly known as: ADVIL   Ingrezza 40 MG capsule Generic drug: valbenazine   lidocaine 5 % Commonly known as: Lidoderm   meclizine 25 MG tablet Commonly known as: ANTIVERT   meloxicam 15 MG tablet Commonly known as: MOBIC   Paliperidone Palmitate ER 819 MG/2.625ML injection Commonly known as: INVEGA TRINZA   potassium chloride SA 20 MEQ tablet Commonly known as: KLOR-CON   sodium chloride 1 g tablet       TAKE  these medications    albuterol 108 (90 Base) MCG/ACT inhaler Commonly known as: VENTOLIN HFA Inhale 2 puffs into the lungs every 6 (six) hours as needed for wheezing or shortness of breath. What changed: when to take this   ARIPiprazole 5 MG tablet Commonly known as: ABILIFY Take 5 mg by mouth daily.   benztropine 2 MG tablet Commonly known as: COGENTIN Take 2 mg by mouth 3 (three) times daily.   cetirizine 10 MG tablet  Commonly known as: ZYRTEC Take 10 mg by mouth daily.   cholecalciferol 25 MCG (1000 UNIT) tablet Commonly known as: VITAMIN D Take 1,000 Units by mouth daily.   divalproex 500 MG DR tablet Commonly known as: DEPAKOTE Take 1 tablet (500 mg total) by mouth 3 (three) times daily. What changed:  how much to take when to take this Another medication with the same name was removed. Continue taking this medication, and follow the directions you see here.   docusate sodium 100 MG capsule Commonly known as: COLACE Take 100 mg by mouth daily.   EPINEPHrine 0.3 mg/0.3 mL Soaj injection Commonly known as: EPI-PEN Inject 0.3 mg into the muscle as needed (allergic reactions).   escitalopram 20 MG tablet Commonly known as: LEXAPRO Take 20 mg by mouth daily.   gabapentin 600 MG tablet Commonly known as: NEURONTIN Take 1 tablet (600 mg total) by mouth 3 (three) times daily. What changed: how much to take   gemfibrozil 600 MG tablet Commonly known as: LOPID Take 600 mg by mouth 2 (two) times daily before a meal.   ketoconazole 2 % cream Commonly known as: NIZORAL Apply 1 application topically 2 (two) times daily as needed for irritation.   Lacosamide 150 MG Tabs Take 1 tablet (150 mg total) by mouth 2 (two) times daily. What changed:  when to take this Another medication with the same name was removed. Continue taking this medication, and follow the directions you see here.   linaclotide 72 MCG capsule Commonly known as: LINZESS Take 72 mcg by mouth  daily.   loperamide 2 MG capsule Commonly known as: IMODIUM Take 2 mg by mouth See admin instructions. Take 2 for the first dose, then 1 after each loose bowel movement. Max of 8 in a day   pantoprazole 40 MG tablet Commonly known as: PROTONIX Take 40 mg by mouth 2 (two) times daily.   polyethylene glycol 17 g packet Commonly known as: MIRALAX / GLYCOLAX Take 17 g by mouth daily.   pravastatin 40 MG tablet Commonly known as: PRAVACHOL Take 40 mg by mouth every evening.   propranolol 120 MG 24 hr capsule Commonly known as: INNOPRAN XL Take 120 mg by mouth daily at 12 noon.   simethicone 80 MG chewable tablet Commonly known as: MYLICON Chew 80 mg by mouth 2 (two) times daily.   STIOLTO RESPIMAT IN Inhale 2 puffs into the lungs daily.   SUMAtriptan 100 MG tablet Commonly known as: IMITREX Take 100 mg by mouth every 2 (two) hours as needed for migraine. May repeat in 2 hours if headache persists or recurs.       Allergies  Allergen Reactions   Bee Venom Hives      Procedures/Studies: DG Tibia/Fibula Left  Result Date: 11/26/2020 CLINICAL DATA:  Red and swollen left leg. EXAM: LEFT TIBIA AND FIBULA - 2 VIEW COMPARISON:  February 16, 2020 FINDINGS: There is no evidence of fracture or other focal bone lesions. Soft tissues are unremarkable. IMPRESSION: Negative. Electronically Signed   By: Aram Candela M.D.   On: 11/26/2020 19:13   CT Head Wo Contrast  Result Date: 11/26/2020 CLINICAL DATA:  Status post fall. EXAM: CT HEAD WITHOUT CONTRAST TECHNIQUE: Contiguous axial images were obtained from the base of the skull through the vertex without intravenous contrast. COMPARISON:  June 08, 2020 FINDINGS: Brain: No evidence of acute infarction, hemorrhage, hydrocephalus, extra-axial collection or mass lesion/mass effect. Vascular: No hyperdense vessel or unexpected calcification. Skull: Normal. Negative for  fracture or focal lesion. Sinuses/Orbits: Bilateral anterior  maxillary sinus polyps versus mucous retention cysts are seen. Mild bilateral ethmoid sinus mucosal thickening is also noted. Other: Endotracheal and orogastric tubes are present. IMPRESSION: 1. No acute intracranial abnormality. 2. Bilateral maxillary sinus and bilateral ethmoid sinus disease. Electronically Signed   By: Aram Candela M.D.   On: 11/26/2020 19:18   CT Cervical Spine Wo Contrast  Result Date: 11/26/2020 CLINICAL DATA:  Status post fall. EXAM: CT CERVICAL SPINE WITHOUT CONTRAST TECHNIQUE: Multidetector CT imaging of the cervical spine was performed without intravenous contrast. Multiplanar CT image reconstructions were also generated. COMPARISON:  April 17, 2020 FINDINGS: Alignment: Normal. Skull base and vertebrae: No acute fracture. No primary bone lesion or focal pathologic process. Soft tissues and spinal canal: No prevertebral fluid or swelling. No visible canal hematoma. Disc levels: Normal multilevel endplates are seen with normal multilevel intervertebral disc spaces. Mild, bilateral multilevel facet joint hypertrophy is noted. Upper chest: Negative. Other: Orogastric and nasogastric tubes are present. IMPRESSION: No acute cervical spine fracture or subluxation. Electronically Signed   By: Aram Candela M.D.   On: 11/26/2020 19:38   CT Tibia Fibula Left Wo Contrast  Result Date: 11/26/2020 CLINICAL DATA:  Left lower leg pain. EXAM: CT OF THE LOWER LEFT EXTREMITY WITHOUT CONTRAST TECHNIQUE: Multidetector CT imaging of the lower left extremity was performed according to the standard protocol. COMPARISON:  Left tibia and fibula x-rays from same day. Left ankle x-rays dated February 16, 2020. FINDINGS: Bones/Joint/Cartilage No acute fracture or dislocation. Old partially united fracture of the inferior lateral malleolus. Chronic avulsion fracture of the anterior medial malleolus. Joint spaces are preserved. Osteopenia. No joint effusion. Ligaments Ligaments are suboptimally  evaluated by CT. Muscles and Tendons Grossly intact. No muscle atrophy. Soft tissue Mild circumferential soft tissue swelling of the lower leg. No fluid collection or hematoma. No soft tissue mass. IMPRESSION: 1. Mild circumferential soft tissue swelling of the lower leg, nonspecific. 2. No acute osseous abnormality. 3. Old bimalleolar fractures. Electronically Signed   By: Obie Dredge M.D.   On: 11/26/2020 19:01   DG CHEST PORT 1 VIEW  Result Date: 11/27/2020 CLINICAL DATA:  Encephalopathy, central line placement EXAM: PORTABLE CHEST 1 VIEW COMPARISON:  11/26/2020 FINDINGS: Interval placement right neck vascular catheter, tip projecting over the lower SVC. Endotracheal tube remains in unchanged position, just below the thoracic inlet. Esophagogastric tube remains with tip below the diaphragm, side port at the level gastroesophageal junction. There is new atelectasis or consolidation of left lung base. IMPRESSION: 1. Interval placement of right neck vascular catheter, tip projecting over the lower SVC. 2. Endotracheal tube remains in unchanged position, just below the thoracic inlet. 3. Esophagogastric tube remains with tip below the diaphragm, side port at the level of the gastroesophageal junction. Recommend advancement to ensure subdiaphragmatic positioning of tip and side port. 4. New atelectasis or consolidation of the left lung base. Electronically Signed   By: Lauralyn Primes M.D.   On: 11/27/2020 10:23   DG Chest Portable 1 View  Result Date: 11/26/2020 CLINICAL DATA:  Status post intubation. EXAM: PORTABLE CHEST 1 VIEW COMPARISON:  May 21, 2020 FINDINGS: An endotracheal tube is seen with its distal tip approximately 6.1 cm from the carina. A nasogastric tube is also noted with its distal tip approximately 5.0 cm distal to the gastroesophageal junction. There is no evidence of acute infiltrate, pleural effusion or pneumothorax. The heart size and mediastinal contours are within normal limits.  The visualized skeletal  structures are unremarkable. IMPRESSION: Endotracheal tube and nasogastric tube positioning, as described above. Further advancement of the nasogastric tube by approximately 6 cm is recommended to decrease the risk of aspiration. Electronically Signed   By: Aram Candela M.D.   On: 11/26/2020 19:11   Overnight EEG with video  Result Date: 11/27/2020 Charlsie Quest, MD     11/28/2020  1:13 PM Patient Name: Dontrey Snellgrove MRN: [id number] Epilepsy Attending: Charlsie Quest Referring Physician/Provider: Dr Ritta Slot Duration: 11/27/2020 1231 to 11/27/2020 1213 Patient history: 45 yo M presenting with AMS after LOC with gaze deviation(which is reportedly a typical seizure) consistent with status epilepticus. EEG to evaluate for seizure Level of alertness:  lethargic AEDs during EEG study: LEV, VPA, LCM, Gabapentin Technical aspects:  This EEG was obtained using a 10 lead EEG system positioned circumferentially without any parasagittal coverage (rapid EEG). Computer selected EEG is reviewed as well as background features and all clinically significant events. Description: No posterior dominant rhythm was seen. EEG showed continuous generalized 3 to 6 Hz theta-delta slowing. Hyperventilation and photic stimulation were not performed.   Parts of study were technically difficult due to high impedence and electrode artifact. ABNORMALITY - Continuous slow, generalized IMPRESSION: This study is suggestive of moderate diffuse encephalopathy, nonspecific etiology. No seizures or epileptiform discharges were seen throughout the recording. Priyanka O Yadav   VAS [place] LOWER EXTREMITY VENOUS (DVT)  Result Date: 11/28/2020  Lower Venous DVT Study Patient Name:  THIERRY DOBOSZ  Date of Exam:   11/28/2020 Medical Rec #: [id number]        Accession #:    [id number] Date of Birth: [birth year]         Patient Gender: M Patient Age:   12Y Exam Location:  [facility] Procedure:      VAS [place]  LOWER EXTREMITY VENOUS (DVT) Referring Phys: [id number] Lorin Glass --------------------------------------------------------------------------------  Indications: Edema and erythema.  Limitations: Poor ultrasound/tissue interface and blisters on distal calf. Comparison Study: 06-25-2018 Prior bilateral lower extremity venous was negative                   for DVT. Performing Technologist: Jean Rosenthal RDMS,RVT  Examination Guidelines: A complete evaluation includes B-mode imaging, spectral Doppler, color Doppler, and power Doppler as needed of all accessible portions of each vessel. Bilateral testing is considered an integral part of a complete examination. Limited examinations for reoccurring indications may be performed as noted. The reflux portion of the exam is performed with the patient in reverse Trendelenburg.  +-----+---------------+---------+-----------+----------+--------------+ RIGHTCompressibilityPhasicitySpontaneityPropertiesThrombus Aging +-----+---------------+---------+-----------+----------+--------------+ CFV  Full           Yes      Yes                                 +-----+---------------+---------+-----------+----------+--------------+   +---------+---------------+---------+-----------+----------+-------------------+ LEFT     CompressibilityPhasicitySpontaneityPropertiesThrombus Aging      +---------+---------------+---------+-----------+----------+-------------------+ CFV      Full           Yes      Yes                                      +---------+---------------+---------+-----------+----------+-------------------+ SFJ      Full                                                             +---------+---------------+---------+-----------+----------+-------------------+  FV Prox  Full                                                             +---------+---------------+---------+-----------+----------+-------------------+ FV Mid   Full                                                              +---------+---------------+---------+-----------+----------+-------------------+ FV DistalFull                                                             +---------+---------------+---------+-----------+----------+-------------------+ PFV      Full                                                             +---------+---------------+---------+-----------+----------+-------------------+ POP      Full           Yes      Yes                                      +---------+---------------+---------+-----------+----------+-------------------+ PTV      Full                                                             +---------+---------------+---------+-----------+----------+-------------------+ PERO     Full                                         Some segments not                                                         well visualized     +---------+---------------+---------+-----------+----------+-------------------+    Summary: RIGHT: - No evidence of common femoral vein obstruction.  LEFT: - There is no evidence of deep vein thrombosis in the lower extremity. However, portions of this examination were limited- see technologist comments above.  - No cystic structure found in the popliteal fossa.  *See table(s) above for measurements and observations. Electronically signed by Fabienne Bruns MD on 11/28/2020 at 7:05:41 PM.    Final    ECHOCARDIOGRAM LIMITED  Result Date: 11/27/2020    ECHOCARDIOGRAM LIMITED REPORT   Patient Name:   DASCHEL ROUGHTON  Date of Exam: 11/27/2020 Medical Rec #:  [id number]       Height:       72.0 in Accession #:    [id number]      Weight:       188.0 lb Date of Birth:  12/12/1975        BSA:          2.075 m Patient Age:    44 years        BP:           112/67 mmHg Patient Gender: M               HR:           58 bpm. Exam Location:  Inpatient Procedure: 2D Echo, Limited Echo, Cardiac Doppler and  Color Doppler Indications:    I42.9 Cardiomyopathy (unspecified)  History:        Patient has prior history of Echocardiogram examinations, most                 recent 06/03/2017. Signs/Symptoms:Altered Mental Status.  Sonographer:    Roosvelt Maserachel Lane RDCS Referring Phys: [id number] Lorin GlassANIEL C SMITH IMPRESSIONS  1. Left ventricular ejection fraction, by estimation, is 60 to 65%. The left ventricle has normal function. The left ventricle has no regional wall motion abnormalities. Left ventricular diastolic parameters are indeterminate.  2. Right ventricular systolic function is normal. The right ventricular size is normal.  3. The mitral valve is grossly normal. Mild mitral valve regurgitation. No evidence of mitral stenosis.  4. The aortic valve is normal in structure. Aortic valve regurgitation is mild. No aortic stenosis is present.  5. The tricuspid valve is grossly normal. Mild tricuspid valve regurgitation. No evidence of tricuspid stenosis.  6. Limited echo. Only parasternal views obtained as patient in restraints and unable to get apical images. FINDINGS  Left Ventricle: Left ventricular ejection fraction, by estimation, is 60 to 65%. The left ventricle has normal function. The left ventricle has no regional wall motion abnormalities. The left ventricular internal cavity size was normal in size. There is  no left ventricular hypertrophy. Left ventricular diastolic parameters are indeterminate. Right Ventricle: The right ventricular size is normal. No increase in right ventricular wall thickness. Right ventricular systolic function is normal. Left Atrium: Left atrial size was normal in size. Right Atrium: Right atrial size was normal in size. Pericardium: There is no evidence of pericardial effusion. Mitral Valve: The mitral valve is grossly normal. Mild mitral valve regurgitation. No evidence of mitral valve stenosis. Tricuspid Valve: Tricuspid valve regurgitation is mild . No evidence of tricuspid stenosis. Aortic  Valve: The aortic valve is normal in structure. Aortic valve regurgitation is mild. No aortic stenosis is present. Pulmonic Valve: The pulmonic valve was grossly normal. Pulmonic valve regurgitation is trivial. No evidence of pulmonic stenosis. Aorta: The aortic root is normal in size and structure. IAS/Shunts: No atrial level shunt detected by color flow Doppler. LEFT VENTRICLE PLAX 2D LVIDd:         4.10 cm LVIDs:         2.60 cm LV PW:         0.90 cm LV IVS:        0.80 cm LVOT diam:     2.10 cm LVOT Area:     3.46 cm   AORTA Ao Asc diam: 2.90 cm TRICUSPID VALVE TR Peak grad:   22.1 mmHg TR Vmax:        235.00 cm/s  SHUNTS Systemic Diam: 2.10 cm Arvilla Meres MD Electronically signed by Arvilla Meres MD Signature Date/Time: 11/27/2020/5:22:45 PM    Final    CT CHEST ABDOMEN PELVIS WO CONTRAST  Result Date: 11/26/2020 CLINICAL DATA:  Status post fall with subsequent unresponsiveness. EXAM: CT CHEST, ABDOMEN AND PELVIS WITHOUT CONTRAST TECHNIQUE: Multidetector CT imaging of the chest, abdomen and pelvis was performed following the standard protocol without IV contrast. COMPARISON:  Oct 28, 2020 FINDINGS: CT CHEST FINDINGS Cardiovascular: No significant vascular findings. Normal heart size. No pericardial effusion. Mediastinum/Nodes: No enlarged mediastinal, hilar, or axillary lymph nodes. Thyroid gland, trachea, and esophagus demonstrate no significant findings. Lungs/Pleura: Endotracheal and orogastric tubes are in place. Mild atelectasis is seen within the posterior aspect of the bilateral lower lobes. There is no evidence of acute infiltrate, pleural effusion or pneumothorax. Musculoskeletal: Acute fracture of the distal left clavicle is seen (axial CT images 6 through 10, CT series 2). Chronic sixth, seventh, eighth, ninth and tenth left rib fractures are seen. Chronic posterior tenth and eleventh right rib fractures are also noted. CT ABDOMEN PELVIS FINDINGS Hepatobiliary: No focal liver abnormality  is seen. No gallstones, gallbladder wall thickening, or biliary dilatation. Pancreas: Unremarkable. No pancreatic ductal dilatation or surrounding inflammatory changes. Spleen: Normal in size without focal abnormality. Adrenals/Urinary Tract: Adrenal glands are unremarkable. Kidneys are normal, without renal calculi, focal lesion, or hydronephrosis. A Foley catheter is present within the urinary bladder. Stomach/Bowel: Stomach is within normal limits. The distal tip of the orogastric tube is noted just beyond the gastroesophageal junction. Appendix appears normal. Stool and retained contrast is noted throughout the large bowel. No evidence of bowel wall thickening, distention, or inflammatory changes. Vascular/Lymphatic: Aortic atherosclerosis. No enlarged abdominal or pelvic lymph nodes. Reproductive: Prostate is unremarkable. Other: No abdominal wall hernia or abnormality. No abdominopelvic ascites. Musculoskeletal: A chronic fracture of the left transverse process of the L4 vertebral body is seen. No acute or significant osseous findings. IMPRESSION: 1. Acute fracture of the distal left clavicle. 2. Chronic L4 vertebral body left transverse process fracture with multiple chronic bilateral rib fractures. 3. Orogastric tube positioning, as described above. Further advancement of the OG tube by approximately 6 cm is recommended to further decrease the risk of aspiration. 4. No acute or active process within the abdomen or pelvis. Electronically Signed   By: Aram Candela M.D.   On: 11/26/2020 19:33      Subjective: Denies complaints.  No headache or pain elsewhere.  No audiovisual hallucinations.  No seizures.  Discharge Exam:  Vitals:   12/01/20 0431 12/01/20 0436 12/01/20 0753 12/01/20 1125  BP: 105/67 105/67 124/69 (!) 93/59  Pulse: (!) 51 (!) 51 61 (!) 56  Resp: 18 18 17 18   Temp: 99.7 F (37.6 C) 99.7 F (37.6 C) 98 F (36.7 C) 98.3 F (36.8 C)  TempSrc: Oral Oral Oral Oral  SpO2: 98%  98% 99% 99%  Weight:        General exam: Pleasant young male, moderately built and nourished lying comfortably propped up in bed without distress. Respiratory system: Clear to auscultation. Respiratory effort normal. Cardiovascular system: S1 and S2 heard, RRR.  No JVD, murmurs or pedal edema.  Telemetry personally reviewed: Sinus rhythm. Gastrointestinal system: Abdomen is nondistended, soft and nontender. No organomegaly or masses felt. Normal bowel sounds heard. Central nervous system: Alert and oriented. No focal neurological deficits. Extremities: Symmetric 5 x 5 power. Skin: Left lower extremity, medial aspect, cellulitis features/erythema has almost resolved.  Picture from below  is not from today.  Large water blister has ruptured since yesterday without acute findings. Psychiatry: Judgement and insight appear normal. Mood & affect appropriate.       The results of significant diagnostics from this hospitalization (including imaging, microbiology, ancillary and laboratory) are listed below for reference.     Microbiology: Recent Results (from the past 240 hour(s))  Blood culture (routine x 2)     Status: None   Collection Time: 11/26/20  4:51 PM   Specimen: BLOOD  Result Value Ref Range Status   Specimen Description BLOOD RIGHT ANTECUBITAL  Final   Special Requests   Final    BOTTLES DRAWN AEROBIC AND ANAEROBIC Blood Culture adequate volume   Culture   Final    NO GROWTH 5 DAYS Performed at [facility], [address] [place], [place] [address]    Report Status 12/01/2020 FINAL  Final  Blood culture (routine x 2)     Status: None   Collection Time: 11/26/20  4:51 PM   Specimen: BLOOD  Result Value Ref Range Status   Specimen Description BLOOD RIGHT HAND  Final   Special Requests   Final    BOTTLES DRAWN AEROBIC AND ANAEROBIC Blood Culture results may not be optimal due to an excessive volume of blood received in culture bottles   Culture   Final    NO  GROWTH 5 DAYS Performed at [facility], [address] [place], [place] [address]    Report Status 12/01/2020 FINAL  Final  Resp Panel by RT-PCR (Flu A&B, Covid) Nasopharyngeal Swab     Status: None   Collection Time: 11/26/20  4:51 PM   Specimen: Nasopharyngeal Swab; Nasopharyngeal(NP) swabs in vial transport medium  Result Value Ref Range Status   SARS Coronavirus 2 by RT PCR NEGATIVE NEGATIVE Final    Comment: (NOTE) SARS-CoV-2 target nucleic acids are NOT DETECTED.  The SARS-CoV-2 RNA is generally detectable in upper respiratory specimens during the acute phase of infection. The lowest concentration of SARS-CoV-2 viral copies this assay can detect is 138 copies/mL. A negative result does not preclude SARS-Cov-2 infection and should not be used as the sole basis for treatment or other patient management decisions. A negative result may occur with  improper specimen collection/handling, submission of specimen other than nasopharyngeal swab, presence of viral mutation(s) within the areas targeted by this assay, and inadequate number of viral copies(<138 copies/mL). A negative result must be combined with clinical observations, patient history, and epidemiological information. The expected result is Negative.  Fact Sheet for Patients:  BloggerCourse.com  Fact Sheet for Healthcare Providers:  SeriousBroker.it  This test is no t yet approved or cleared by the [place] FDA and  has been authorized for detection and/or diagnosis of SARS-CoV-2 by FDA under an Emergency Use Authorization (EUA). This EUA will remain  in effect (meaning this test can be used) for the duration of the COVID-19 declaration under Section 564(b)(1) of the Act, 21 U.S.C.section 360bbb-3(b)(1), unless the authorization is terminated  or revoked sooner.       Influenza A by PCR NEGATIVE NEGATIVE Final   Influenza B by PCR NEGATIVE NEGATIVE  Final    Comment: (NOTE) The Xpert Xpress SARS-CoV-2/FLU/RSV plus assay is intended as an aid in the diagnosis of influenza from Nasopharyngeal swab specimens and should not be used as a sole basis for treatment. Nasal washings and aspirates are unacceptable for Xpert Xpress SARS-CoV-2/FLU/RSV testing.  Fact Sheet for Patients: BloggerCourse.com  Fact Sheet  for Healthcare Providers: SeriousBroker.it  This test is not yet approved or cleared by the [place] and has been authorized for detection and/or diagnosis of SARS-CoV-2 by FDA under an Emergency Use Authorization (EUA). This EUA will remain in effect (meaning this test can be used) for the duration of the COVID-19 declaration under Section 564(b)(1) of the Act, 21 U.S.C. section 360bbb-3(b)(1), unless the authorization is terminated or revoked.  Performed at [facility], [address] [place], [place] [address]   MRSA Next Gen by PCR, Nasal     Status: None   Collection Time: 11/26/20 11:11 PM   Specimen: Nasal Mucosa; Nasal Swab  Result Value Ref Range Status   MRSA by PCR Next Gen NOT DETECTED NOT DETECTED Final    Comment: (NOTE) The GeneXpert MRSA Assay (FDA approved for NASAL specimens only), is one component of a comprehensive MRSA colonization surveillance program. It is not intended to diagnose MRSA infection nor to guide or monitor treatment for MRSA infections. Test performance is not FDA approved in patients less than 41 years old. Performed at [facility], [address] [place], [place] [address]   Culture, Respiratory w Gram Stain (tracheal aspirate)     Status: None   Collection Time: 11/27/20  1:08 AM   Specimen: Tracheal Aspirate; Respiratory  Result Value Ref Range Status   Specimen Description TRACHEAL ASPIRATE  Final   Special Requests NONE  Final   Gram Stain   Final    FEW WBC PRESENT,BOTH PMN AND  MONONUCLEAR ABUNDANT GRAM NEGATIVE RODS ABUNDANT GRAM POSITIVE COCCI IN PAIRS IN CLUSTERS FEW GRAM POSITIVE RODS FEW YEAST    Culture   Final    ABUNDANT Consistent with normal respiratory flora. No Pseudomonas species isolated Performed at [facility], [address] [place], [place] [address]    Report Status 11/29/2020 FINAL  Final     Labs: CBC: Recent Labs  Lab 11/26/20 1654 11/27/20 0115 11/27/20 0221 11/28/20 0522 11/29/20 0350 11/30/20 0352 12/01/20 0433  WBC 5.0  --  7.8 5.9 6.8 6.5 5.4  NEUTROABS 2.1  --  5.0  --   --   --   --   HGB 11.7*   < > 11.1* 9.5* 9.3* 10.0* 9.6*  HCT 33.5*   < > 31.8* 28.0* 27.3* 29.1* 27.2*  MCV 91.5  --  91.4 94.0 94.5 91.8 90.7  PLT 131*  --  124* PLATELET CLUMPS NOTED ON SMEAR, COUNT APPEARS DECREASED 76* 97* 130*   < > = values in this interval not displayed.    Basic Metabolic Panel: Recent Labs  Lab 11/27/20 0221 11/28/20 0522 11/29/20 0350 11/30/20 0352 12/01/20 0433  NA 139 139 139 141 139  K 3.3* 4.1 3.2* 3.5 3.5  CL 104 106 106 109 106  CO2 20* 25 25 28 27   GLUCOSE 183* 108* 82 95 103*  BUN 28* 12 8 <5* 8  CREATININE 1.57* 0.77 0.61 0.57* 0.56*  CALCIUM 7.9* 8.2* 8.3* 8.4* 8.4*  MG 1.8  --   --  1.7  --   PHOS 3.5  --   --   --   --     Liver Function Tests: Recent Labs  Lab 11/26/20 1654 11/27/20 0221 12/01/20 0433  AST 51* 64* 20  ALT 22 27 18   ALKPHOS 92 78 65  BILITOT 1.3* 1.4* 0.4  PROT 6.2* 5.3* 4.0*  ALBUMIN 3.2* 2.8* 2.0*    CBG: Recent Labs  Lab 11/27/20 0328 11/27/20 0759 11/27/20 1136 11/27/20 1545 11/27/20 1927  GLUCAP 190* 136* 163* 156* 113*     Lipid Profile Recent Labs    11/30/20 0352  TRIG 122   Urinalysis    Component Value Date/Time   COLORURINE YELLOW (A) 11/26/2020 1731   APPEARANCEUR HAZY (A) 11/26/2020 1731   APPEARANCEUR Clear 02/27/2014 2152   LABSPEC 1.015 11/26/2020 1731   LABSPEC 1.004 02/27/2014 2152   PHURINE 5.0 11/26/2020 1731    GLUCOSEU NEGATIVE 11/26/2020 1731   GLUCOSEU Negative 02/27/2014 2152   HGBUR NEGATIVE 11/26/2020 1731   BILIRUBINUR NEGATIVE 11/26/2020 1731   BILIRUBINUR Negative 02/27/2014 2152   KETONESUR NEGATIVE 11/26/2020 1731   PROTEINUR NEGATIVE 11/26/2020 1731   NITRITE NEGATIVE 11/26/2020 1731   LEUKOCYTESUR NEGATIVE 11/26/2020 1731   LEUKOCYTESUR Negative 02/27/2014 2152      Time coordinating discharge: 45 minutes  SIGNED:  Marcellus Scott, MD, FACP, [facility]. [facility]  To contact the attending provider between 7A-7P or the covering provider during after hours 7P-7A, please log into the web site [email or web address] and access using universal  password for that web site. If you do not have the password, please call the hospital operator.

## 2020-12-01 NOTE — Progress Notes (Addendum)
Physical Therapy Treatment Patient Details Name: Russell Buchanan MRN: [id number] DOB: Nov 29, 1975 Today's Date: 12/01/2020    History of Present Illness 45 y.o. male admitted s/p fall outside of his group home.  PMH includes: hx of falls, Antisocial personality disorder, Bipolar disorder, GERD, Hyperlipidemia, Schizophrenia, Seizures, Tremors of nervous system.    PT Comments    Pt admitted with above diagnosis. Pt was able to ambulate a short distance with pt very unsteady at times needing min assist for balance.  Did attempt to perform vestibular assessment however limited due to pts cognition.  Pt negative for BPPV all canals.  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  MD:  Given that pt may not be able to progress with vestibular rehab and this is quite possibly chronic for pt, question if Antivert or Meclizine might be beneficial.  Please address if you agree.  Contacted Ortho tech to re-order sling as could not find his sling in the room.  Pt currently with functional limitations due to balance and endurance deficits. Pt will benefit from skilled PT to increase their independence and safety with mobility to allow discharge to the venue listed below.      Follow Up Recommendations  Home health PT;Outpatient PT;Supervision for mobility/OOB (CM Kelli going to check into transportation options)     Equipment Recommendations  Cane    Recommendations for Other Services       Precautions / Restrictions Precautions Precautions: Fall Required Braces or Orthoses: Sling Restrictions: NWB LUE Other Position/Activity Restrictions: sling for comfort and no limitations for AROM to LUE.    Mobility  Bed Mobility Overal bed mobility: Modified Independent             General bed mobility comments: Assessed all canals for BPPV prior to getting OOB and negative testing.  Sling not on pt on  left UE and could not find the sling in the room.  Needed cues for maintaining NWB left UE.  Called Ortho tech to have a new one delivered.  Performed Vestibular assessment while pt sitting EOB but difficult to assess as pt could not follow instructions.  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 Patient Response: Flat affect  Transfers Overall transfer level: Needs assistance Equipment used: None Transfers: Sit to/from Stand Sit to Stand: Min guard Stand pivot transfers: Min guard       General transfer comment: min guard to min assist to rise as pt was dizzy from earlier vestibular assessment.  Had to sit back down due to imbalance the first attempt.  Ambulation/Gait Ambulation/Gait assistance: Min assist Gait Distance (Feet): 50 Feet Assistive device: None Gait Pattern/deviations: Step-through pattern;Trunk flexed;Decreased dorsiflexion - right;Decreased dorsiflexion - left;Staggering right;Staggering left;Leaning posteriorly Gait velocity: decreased Gait velocity interpretation: <1.8 ft/sec, indicate of risk for recurrent falls General Gait Details: Kyphotic posture, decreased bilateral foot clearance. Cues for looking up. MinA to steady with balance challenge or turning.  Due to making pt dizzy with testing prior to walk, pt very imbalanced so limited distance.   Stairs             Wheelchair Mobility    Modified Rankin (Stroke Patients Only)       Balance Overall balance assessment: Needs assistance Sitting-balance support: Feet supported;No upper extremity supported Sitting balance-Leahy Scale: Good  Standing balance support: No upper extremity supported;During functional activity Standing balance-Leahy Scale: Fair Standing balance comment: hunched posture and slow movements.  Hx of falls                            Cognition  Arousal/Alertness: Awake/alert Behavior During Therapy: WFL for tasks assessed/performed;Flat affect Overall Cognitive Status: History of cognitive impairments - at baseline                                 General Comments: follows commands well, mentally slow due to premorbid cognitive/mental disorder.      Exercises Other Exercises Other Exercises: x 1 exercises initiated however pt could not follow commands to do them even with multiple attempts.    General Comments        Pertinent Vitals/Pain Faces Pain Scale: Hurts little more Pain Location: L shoulder Pain Descriptors / Indicators: Tender Pain Intervention(s): Limited activity within patient's tolerance;Monitored during session;Repositioned    Home Living                      Prior Function            PT Goals (current goals can now be found in the care plan section) Acute Rehab PT Goals Patient Stated Goal: Go back to my residence Progress towards PT goals: Progressing toward goals    Frequency    Min 3X/week      PT Plan Current plan remains appropriate    Co-evaluation              AM-PAC PT "6 Clicks" Mobility   Outcome Measure  Help needed turning from your back to your side while in a flat bed without using bedrails?: None Help needed moving from lying on your back to sitting on the side of a flat bed without using bedrails?: None Help needed moving to and from a bed to a chair (including a wheelchair)?: A Little Help needed standing up from a chair using your arms (e.g., wheelchair or bedside chair)?: A Little Help needed to walk in hospital room?: A Little Help needed climbing 3-5 steps with a railing? : A Little 6 Click Score: 20    End of Session Equipment Utilized During Treatment: Gait belt Activity Tolerance: Patient tolerated treatment well;Patient limited by fatigue (limited due to dizziness) Patient left: in chair;with call bell/phone within reach;with  chair alarm set Nurse Communication: Mobility status PT Visit Diagnosis: Unsteadiness on feet (R26.81);History of falling (Z91.81)     Time: 6503-5465 PT Time Calculation (min) (ACUTE ONLY): 26 min  Charges:  $Gait Training: 8-22 mins $Therapeutic Exercise: 8-22 mins                     Russell Buchanan,PT Acute Rehab Services [phone] (pager)    Russell Buchanan 12/01/2020, 10:10 AM

## 2020-12-01 NOTE — TOC Transition Note (Addendum)
Transition of Care [facility]) - CM/SW Discharge Note   Patient Details  Name: Russell Buchanan MRN: [id number] Date of Birth: 01/24/1976  [facility] CM/SW Contact:  Kermit Balo, RN Phone Number: 12/01/2020, 4:16 PM   Clinical Narrative:    Patient is discharging back to his Group home: [facility] in [place]. CM has spoken to [place] the Production designer, theatre/television/film and she is good with his return. CM has faxed FL2 and d/c summary to number provided: [phone] Pt to transport via cab to the facility. CM will provide voucher to the bedside RN. Address verified with [facility]ious [facility]. [employer] and [employer] dont have canes for pt. CM updated Precious [facility] and they will purchase the patient one out side of the hospital.    Final next level of care: Home w Home Health Services Barriers to Discharge: No Barriers Identified   Patient Goals and CMS Choice   CMS Medicare.gov Compare Post Acute Care list provided to:: Patient Choice offered to / list presented to : Patient  Discharge Placement                       Discharge Plan and Services                DME Arranged: Gilmer Mor DME Agency: [employer] Date DME Agency Contacted: 12/01/20   Representative spoke with at DME Agency: Velna Hatchet [facility] Arranged: PT [facility] Agency: [facility] Date [facility] Agency Contacted: 12/01/20   Representative spoke with at [facility] Agency: Frances Furbish  Social Determinants of Health (SDOH) Interventions     Readmission Risk Interventions No flowsheet data found.

## 2020-12-01 NOTE — [place] FL2 (Signed)
[place] MEDICAID FL2 LEVEL OF CARE SCREENING TOOL     IDENTIFICATION  Patient Name: Russell Buchanan Birthdate: [birth year] Sex: male Admission Date (Current Location): 11/26/2020  [facility] and [place] Number:  Chiropodist and Address:  [facility], [address] [place], [place] [address]      Provider Number: [id number]  Attending Physician Name and Address:  Elease Etienne, MD  Relative Name and Phone Number:  Rowan Blase [phone], group home owner    Current Level of Care: Hospital Recommended Level of Care: Other (Comment) (Group home) Prior Approval Number:    Date Approved/Denied:   PASRR Number:    Discharge Plan: Other (Comment) (group home)    Current Diagnoses: Patient Active Problem List   Diagnosis Date Noted   Cellulitis 11/29/2020   Severe sepsis with septic shock (HCC) 11/29/2020   AKI (acute kidney injury) (HCC) 11/29/2020   Thrombocytopenia (HCC) 11/29/2020   Hypokalemia 11/29/2020   Anemia 11/29/2020   Clavicle fracture 11/28/2020   Encephalopathy 11/26/2020   Seizure (HCC) 01/10/2020   AMS (altered mental status) 06/25/2019   Bipolar disorder (HCC)    Antisocial personality disorder (HCC)    Acute encephalopathy 12/04/2016   GERD (gastroesophageal reflux disease) 12/04/2016   HLD (hyperlipidemia) 12/04/2016   Schizophrenia (HCC) 11/15/2016   Medication side effects 11/15/2016    Orientation RESPIRATION BLADDER Height & Weight     Self  Normal Continent Weight: 81.5 kg Height:     BEHAVIORAL SYMPTOMS/MOOD NEUROLOGICAL BOWEL NUTRITION STATUS    Convulsions/Seizures Incontinent Diet (Please see DC Summary)  AMBULATORY STATUS COMMUNICATION OF NEEDS Skin   Limited Assist Verbally Normal                       Personal Care Assistance Level of Assistance  Bathing, Feeding, Dressing Bathing Assistance: Limited assistance Feeding assistance: Limited assistance Dressing Assistance: Limited  assistance     Functional Limitations Info             SPECIAL CARE FACTORS FREQUENCY                       Contractures Contractures Info: Not present    Additional Factors Info  Code Status, Allergies, Psychotropic Code Status Info: Full Allergies Info: Bee Venom Psychotropic Info: Lexapro             Discharge Medications: TAKE these medications     albuterol 108 (90 Base) MCG/ACT inhaler Commonly known as: VENTOLIN HFA Inhale 2 puffs into the lungs every 6 (six) hours as needed for wheezing or shortness of breath. What changed: when to take this    ARIPiprazole 5 MG tablet Commonly known as: ABILIFY Take 5 mg by mouth daily.    benztropine 2 MG tablet Commonly known as: COGENTIN Take 2 mg by mouth 3 (three) times daily.    cetirizine 10 MG tablet Commonly known as: ZYRTEC Take 10 mg by mouth daily.    cholecalciferol 25 MCG (1000 UNIT) tablet Commonly known as: VITAMIN D Take 1,000 Units by mouth daily.    divalproex 500 MG DR tablet Commonly known as: DEPAKOTE Take 1 tablet (500 mg total) by mouth 3 (three) times daily. What changed: how much to take when to take this Another medication with the same name was removed. Continue taking this medication, and follow the directions you see here.    docusate sodium 100 MG capsule Commonly known as: COLACE Take 100  mg by mouth daily.    EPINEPHrine 0.3 mg/0.3 mL Soaj injection Commonly known as: EPI-PEN Inject 0.3 mg into the muscle as needed (allergic reactions).    escitalopram 20 MG tablet Commonly known as: LEXAPRO Take 20 mg by mouth daily.    gabapentin 600 MG tablet Commonly known as: NEURONTIN Take 1 tablet (600 mg total) by mouth 3 (three) times daily. What changed: how much to take    gemfibrozil 600 MG tablet Commonly known as: LOPID Take 600 mg by mouth 2 (two) times daily before a meal.    ketoconazole 2 % cream Commonly known as: NIZORAL Apply 1 application topically  2 (two) times daily as needed for irritation.    Lacosamide 150 MG Tabs Take 1 tablet (150 mg total) by mouth 2 (two) times daily. What changed: when to take this Another medication with the same name was removed. Continue taking this medication, and follow the directions you see here.    linaclotide 72 MCG capsule Commonly known as: LINZESS Take 72 mcg by mouth daily.    loperamide 2 MG capsule Commonly known as: IMODIUM Take 2 mg by mouth See admin instructions. Take 2 for the first dose, then 1 after each loose bowel movement. Max of 8 in a day    pantoprazole 40 MG tablet Commonly known as: PROTONIX Take 40 mg by mouth 2 (two) times daily.    polyethylene glycol 17 g packet Commonly known as: MIRALAX / GLYCOLAX Take 17 g by mouth daily.    pravastatin 40 MG tablet Commonly known as: PRAVACHOL Take 40 mg by mouth every evening.    propranolol 120 MG 24 hr capsule Commonly known as: INNOPRAN XL Take 120 mg by mouth daily at 12 noon.    simethicone 80 MG chewable tablet Commonly known as: MYLICON Chew 80 mg by mouth 2 (two) times daily.    STIOLTO RESPIMAT IN Inhale 2 puffs into the lungs daily.    SUMAtriptan 100 MG tablet Commonly known as: IMITREX Take 100 mg by mouth every 2 (two) hours as needed for migraine. May repeat in 2 hours if headache persists or recurs.       Relevant Imaging Results:  Relevant Lab Results:   Additional Information SS#: [id number]. Moderna vaccines 06/17/19, 07/15/19, 06/14/20, 10/11/20  Kermit Balo, RN

## 2020-12-01 NOTE — Progress Notes (Signed)
Orthopedic Tech Progress Note Patient Details:  Russell Buchanan [birth year] [id number]  Ortho Devices Type of Ortho Device: Arm sling Ortho Device/Splint Location: Left Arm Ortho Device/Splint Interventions: Application   Post Interventions Patient Tolerated: Well Instructions Provided: Care of device  Russell Buchanan Russell Buchanan 12/01/2020, 9:58 AM

## 2020-12-01 NOTE — Plan of Care (Signed)
  Problem: Safety: Goal: Non-violent Restraint(s) Outcome: Progressing   Problem: Education: Goal: Knowledge of General Education information will improve Description: Including pain rating scale, medication(s)/side effects and non-pharmacologic comfort measures Outcome: Progressing   Problem: Health Behavior/Discharge Planning: Goal: Ability to manage health-related needs will improve Outcome: Progressing   Problem: Clinical Measurements: Goal: Ability to maintain clinical measurements within normal limits will improve Outcome: Progressing Goal: Will remain free from infection Outcome: Progressing Goal: Diagnostic test results will improve Outcome: Progressing Goal: Respiratory complications will improve Outcome: Progressing Goal: Cardiovascular complication will be avoided Outcome: Progressing   Problem: Activity: Goal: Risk for activity intolerance will decrease Outcome: Progressing   Problem: Coping: Goal: Level of anxiety will decrease Outcome: Progressing   Problem: Nutrition: Goal: Adequate nutrition will be maintained Outcome: Progressing

## 2020-12-07 LAB — LACOSAMIDE: Lacosamide: 4.4 ug/mL — ABNORMAL LOW (ref 5.0–10.0)

## 2020-12-24 ENCOUNTER — Other Ambulatory Visit: Payer: Self-pay

## 2020-12-24 ENCOUNTER — Encounter: Payer: Self-pay | Admitting: Emergency Medicine

## 2020-12-24 ENCOUNTER — Emergency Department: Payer: Medicaid Other

## 2020-12-24 ENCOUNTER — Emergency Department
Admission: EM | Admit: 2020-12-24 | Discharge: 2020-12-24 | Disposition: A | Discharge status: Home / Self Care | Payer: Medicaid Other | Attending: Emergency Medicine | Admitting: Emergency Medicine

## 2020-12-24 DIAGNOSIS — Y92009 Unspecified place in unspecified non-institutional (private) residence as the place of occurrence of the external cause: Secondary | ICD-10-CM | POA: Insufficient documentation

## 2020-12-24 DIAGNOSIS — W01198A Fall on same level from slipping, tripping and stumbling with subsequent striking against other object, initial encounter: Secondary | ICD-10-CM | POA: Diagnosis not present

## 2020-12-24 DIAGNOSIS — S0083XA Contusion of other part of head, initial encounter: Secondary | ICD-10-CM

## 2020-12-24 DIAGNOSIS — F17210 Nicotine dependence, cigarettes, uncomplicated: Secondary | ICD-10-CM | POA: Diagnosis not present

## 2020-12-24 DIAGNOSIS — S0990XA Unspecified injury of head, initial encounter: Secondary | ICD-10-CM | POA: Diagnosis present

## 2020-12-24 DIAGNOSIS — W19XXXA Unspecified fall, initial encounter: Secondary | ICD-10-CM

## 2020-12-24 NOTE — ED Triage Notes (Signed)
Pt brought in by EMS from group home where he had fallen and hit the right side of his eye. Swelling on the right side of his eye No laceration see.

## 2020-12-24 NOTE — ED Triage Notes (Signed)
Pt in via EMS from group home with fall and hematoma to right side of eye.

## 2020-12-24 NOTE — ED Provider Notes (Addendum)
[facility] Emergency Department Provider Note  ____________________________________________   None    (approximate)  I have reviewed the triage vital signs and the nursing notes.   HISTORY  Chief Complaint Fall  HPI Fed Ceci is a 45 y.o. male presents to the ED via EMS, for evaluation of injury sustained following a mechanical fall.  The group reports the patient had a fall, hitting the right side of his face.  He presents with some swelling around the right cheek, but no obvious laceration or bleeding.  No reported loss of consciousness, nausea, vomiting is reported.  Past Medical History:  Diagnosis Date   Antisocial personality disorder (HCC)    Bipolar disorder (HCC)    Environmental and seasonal allergies    GERD (gastroesophageal reflux disease)    Headache    Hyperlipidemia    Infected elbow (HCC)    Schizophrenia (HCC)    Seizures (HCC)    Tremors of nervous system     Patient Active Problem List   Diagnosis Date Noted   Cellulitis 11/29/2020   Severe sepsis with septic shock (HCC) 11/29/2020   AKI (acute kidney injury) (HCC) 11/29/2020   Thrombocytopenia (HCC) 11/29/2020   Hypokalemia 11/29/2020   Anemia 11/29/2020   Clavicle fracture 11/28/2020   Encephalopathy 11/26/2020   Seizure (HCC) 01/10/2020   AMS (altered mental status) 06/25/2019   Bipolar disorder (HCC)    Antisocial personality disorder (HCC)    Acute encephalopathy 12/04/2016   GERD (gastroesophageal reflux disease) 12/04/2016   HLD (hyperlipidemia) 12/04/2016   Schizophrenia (HCC) 11/15/2016   Medication side effects 11/15/2016    Past Surgical History:  Procedure Laterality Date   CLOSED REDUCTION WRIST FRACTURE Left    HARDWARE REMOVAL Right 04/14/2018   Procedure: HARDWARE REMOVAL-RIGHT ELBOW;  Surgeon: Deeann Saint, MD;  Location: [facility] ORS;  Service: Orthopedics;  Laterality: Right;   ORIF ELBOW FRACTURE Right    SCALP LACERATION REPAIR      WRIST RECONSTRUCTION Left     Prior to Admission medications   Medication Sig Start Date End Date Taking? Authorizing Provider  albuterol (PROVENTIL HFA;VENTOLIN HFA) 108 (90 Base) MCG/ACT inhaler Inhale 2 puffs into the lungs every 6 (six) hours as needed for wheezing or shortness of breath. 01/30/17   Merrily Brittle, MD  ARIPiprazole (ABILIFY) 5 MG tablet Take 5 mg by mouth daily. 11/08/20   [provider]  benztropine (COGENTIN) 2 MG tablet Take 2 mg by mouth 3 (three) times daily. 11/08/20   [provider]  cetirizine (ZYRTEC) 10 MG tablet Take 10 mg by mouth daily.    [provider]  cholecalciferol (VITAMIN D) 25 MCG (1000 UNIT) tablet Take 1,000 Units by mouth daily. 11/08/20   [provider]  divalproex (DEPAKOTE) 500 MG DR tablet Take 1 tablet (500 mg total) by mouth 3 (three) times daily. 12/01/20   Hongalgi, Maximino Greenland, MD  docusate sodium (COLACE) 100 MG capsule Take 100 mg by mouth daily.    [provider]  EPINEPHrine 0.3 mg/0.3 mL IJ SOAJ injection Inject 0.3 mg into the muscle as needed (allergic reactions).     [provider]  escitalopram (LEXAPRO) 20 MG tablet Take 20 mg by mouth daily.    [provider]  gabapentin (NEURONTIN) 600 MG tablet Take 1 tablet (600 mg total) by mouth 3 (three) times daily. 12/01/20   Hongalgi, Maximino Greenland, MD  gemfibrozil (LOPID) 600 MG tablet Take 600 mg by mouth 2 (two) times daily  before a meal.    [provider]  ketoconazole (NIZORAL) 2 % cream Apply 1 application topically 2 (two) times daily as needed for irritation.    [provider]  Lacosamide 150 MG TABS Take 1 tablet (150 mg total) by mouth 2 (two) times daily. 12/01/20   Hongalgi, Maximino Greenland, MD  linaclotide (LINZESS) 72 MCG capsule Take 72 mcg by mouth daily.     [provider]  loperamide (IMODIUM) 2 MG capsule Take 2 mg by mouth See admin instructions. Take 2 for the first dose, then 1 after each loose  bowel movement. Max of 8 in a day 10/03/20   [provider]  pantoprazole (PROTONIX) 40 MG tablet Take 40 mg by mouth 2 (two) times daily. 11/08/20   [provider]  polyethylene glycol (MIRALAX / GLYCOLAX) packet Take 17 g by mouth daily.    [provider]  pravastatin (PRAVACHOL) 40 MG tablet Take 40 mg by mouth every evening. 06/24/19   [provider]  propranolol (INNOPRAN XL) 120 MG 24 hr capsule Take 120 mg by mouth daily at 12 noon. 09/12/20   [provider]  simethicone (MYLICON) 80 MG chewable tablet Chew 80 mg by mouth 2 (two) times daily.    [provider]  SUMAtriptan (IMITREX) 100 MG tablet Take 100 mg by mouth every 2 (two) hours as needed for migraine. May repeat in 2 hours if headache persists or recurs.    [provider]  Tiotropium Bromide-Olodaterol (STIOLTO RESPIMAT IN) Inhale 2 puffs into the lungs daily.    [provider]    Allergies Bee venom  Family History  Family history unknown: Yes    Social History Social History   Tobacco Use   Smoking status: Every Day    Packs/day: 0.50    Years: 7.00    Pack years: 3.50    Types: Cigarettes   Smokeless tobacco: Former    Types: Chew, Snuff  Vaping Use   Vaping Use: Never used  Substance Use Topics   Alcohol use: No   Drug use: Not Currently    Review of Systems  Constitutional: No fever/chills Eyes: No visual changes. ENT: No sore throat. Cardiovascular: Denies chest pain. Respiratory: Denies shortness of breath. Gastrointestinal: No abdominal pain.  No nausea, no vomiting.  No diarrhea.  No constipation. Genitourinary: Negative for dysuria. Musculoskeletal: Negative for back pain. Skin: Negative for rash. Facial contusion as above Neurological: Negative for headaches, focal weakness or numbness. ____________________________________________   PHYSICAL EXAM:  VITAL SIGNS: ED Triage Vitals  Enc Vitals Group     BP 12/24/20  1103 112/65     Pulse Rate 12/24/20 1103 87     Resp 12/24/20 1103 20     Temp 12/24/20 1103 98.2 F (36.8 C)     Temp Source 12/24/20 1103 Oral     SpO2 12/24/20 1103 98 %     Weight 12/24/20 1105 188 lb (85.3 kg)     Height 12/24/20 1105 6' (1.829 m)     Head Circumference --      Peak Flow --      Pain Score --      Pain Loc --      Pain Edu? --      Excl. in GC? --     Constitutional: Alert and oriented. Well appearing and in no acute distress.  Eyes: Conjunctivae are normal. PERRL. EOMI. Head: Atraumatic. Some soft tissue swelling noted to the left  zygomatic cheek Nose: No congestion/rhinnorhea. Mouth/Throat: Mucous membranes are moist.  Oropharynx non-erythematous. Neck: No stridor.  No cervical spine tenderness to palpation. Hematological/Lymphatic/Immunilogical: No cervical lymphadenopathy. Cardiovascular: Normal rate, regular rhythm. Grossly normal heart sounds.  Good peripheral circulation. Respiratory: Normal respiratory effort.  No retractions. Lungs CTAB. Gastrointestinal: Soft and nontender. No distention. No abdominal bruits. No CVA tenderness. Musculoskeletal: No lower extremity tenderness nor edema.  No joint effusions. Neurologic:  Normal speech and language. No gross focal neurologic deficits are appreciated. No gait instability. Skin:  Skin is warm, dry and intact. No rash noted. Psychiatric: Mood and affect are normal. Speech and behavior are normal.  ____________________________________________   LABS (all labs ordered are listed, but only abnormal results are displayed)  Labs Reviewed - No data to display ____________________________________________  EKG  ____________________________________________  RADIOLOGY I, Lissa Hoard, personally viewed and evaluated these images (plain radiographs) as part of my medical decision making, as well as reviewing the written report by the radiologist.  ED MD interpretation:  agree with  report  Official radiology report(s): CT Head Wo Contrast  Result Date: 12/24/2020 CLINICAL DATA:  Facial trauma. Patient fell and hit right side of eye. Swelling and pain. EXAM: CT HEAD WITHOUT CONTRAST CT MAXILLOFACIAL WITHOUT CONTRAST TECHNIQUE: Multidetector CT imaging of the head and maxillofacial structures were performed using the standard protocol without intravenous contrast. Multiplanar CT image reconstructions of the maxillofacial structures were also generated. COMPARISON:  None. FINDINGS: CT HEAD FINDINGS Brain: No evidence of acute infarction, hemorrhage, hydrocephalus, extra-axial collection or mass lesion/mass effect. Vascular: No hyperdense vessel or unexpected calcification. Skull: Normal. Negative for fracture or focal lesion. Other: No other abnormalities. CT MAXILLOFACIAL FINDINGS Osseous: No fracture or mandibular dislocation. No destructive process. Orbits: Negative. No traumatic or inflammatory finding. Sinuses: Mucous retention cysts are seen in the bilateral maxillary sinuses, right greater than left. Paranasal sinuses are otherwise normal in appearance. Mastoid air cells and middle ears are well aerated. Soft tissues: Soft tissue swelling on the right consistent with the patient's history of trauma. The globes are intact. No other soft tissue abnormalities are identified. IMPRESSION: 1. No acute intracranial abnormalities identified. 2. No fractures. Electronically Signed   By: Gerome Sam III M.D   On: 12/24/2020 12:33   CT Maxillofacial Wo Contrast  Result Date: 12/24/2020 CLINICAL DATA:  Facial trauma. Patient fell and hit right side of eye. Swelling and pain. EXAM: CT HEAD WITHOUT CONTRAST CT MAXILLOFACIAL WITHOUT CONTRAST TECHNIQUE: Multidetector CT imaging of the head and maxillofacial structures were performed using the standard protocol without intravenous contrast. Multiplanar CT image reconstructions of the maxillofacial structures were also generated. COMPARISON:   None. FINDINGS: CT HEAD FINDINGS Brain: No evidence of acute infarction, hemorrhage, hydrocephalus, extra-axial collection or mass lesion/mass effect. Vascular: No hyperdense vessel or unexpected calcification. Skull: Normal. Negative for fracture or focal lesion. Other: No other abnormalities. CT MAXILLOFACIAL FINDINGS Osseous: No fracture or mandibular dislocation. No destructive process. Orbits: Negative. No traumatic or inflammatory finding. Sinuses: Mucous retention cysts are seen in the bilateral maxillary sinuses, right greater than left. Paranasal sinuses are otherwise normal in appearance. Mastoid air cells and middle ears are well aerated. Soft tissues: Soft tissue swelling on the right consistent with the patient's history of trauma. The globes are intact. No other soft tissue abnormalities are identified. IMPRESSION: 1. No acute intracranial abnormalities identified. 2. No fractures. Electronically Signed   By: Gerome Sam III M.D   On: 12/24/2020 12:33    ____________________________________________  PROCEDURES  Procedure(s) performed (including Critical Care):  Procedures   ____________________________________________   INITIAL IMPRESSION / ASSESSMENT AND PLAN / ED COURSE  As part of my medical decision making, I reviewed the following data within the electronic MEDICAL RECORD NUMBER Radiograph reviewed WNL and Notes from prior ED visits      DDX: facial contusion, facial fracture, SDH  Patient ED evaluation of mechanical fall resulting in a facial contusion.  He is evaluated for his complaints in the ED.  He is found to have a normal head CT without evidence of acute cranial process.  Exam is overall benign and reassuring at this time.  He is discharged to follow-up with his primary provider for ongoing symptoms.  Return precautions of been reviewed.  ____________________________________________   FINAL CLINICAL IMPRESSION(S) / ED DIAGNOSES  Final diagnoses:  Fall in  home, initial encounter  Contusion of face, initial encounter     ED Discharge Orders     None        Note:  This document was prepared using Dragon voice recognition software and may include unintentional dictation errors.    Lissa Hoard, PA-C 12/24/20 1303    Lissa Hoard, PA-C 12/24/20 1341    Shaune Pollack, MD 12/25/20 [phone]

## 2020-12-24 NOTE — Discharge Instructions (Addendum)
Your exam, and CTs are normal and reassuring at this time for no signs of a serious facer injury, or close head injury.  You may apply ice to reduce swelling to the face.  Follow-up with primary provider for ongoing symptoms.  Return to the ED as needed.

## 2020-12-24 NOTE — ED Notes (Signed)
Spoke with group home owner, [name] and asked to send transport to pick pt up.

## 2021-06-15 ENCOUNTER — Ambulatory Visit: Payer: Medicaid Other | Admitting: Podiatry

## 2021-06-22 ENCOUNTER — Ambulatory Visit (INDEPENDENT_AMBULATORY_CARE_PROVIDER_SITE_OTHER): Payer: Medicaid Other | Admitting: Podiatry

## 2021-06-22 ENCOUNTER — Other Ambulatory Visit: Payer: Self-pay

## 2021-06-22 DIAGNOSIS — Q828 Other specified congenital malformations of skin: Secondary | ICD-10-CM

## 2021-06-22 DIAGNOSIS — M216X1 Other acquired deformities of right foot: Secondary | ICD-10-CM

## 2021-06-22 DIAGNOSIS — M216X2 Other acquired deformities of left foot: Secondary | ICD-10-CM

## 2021-06-22 NOTE — Progress Notes (Signed)
Subjective:  Patient ID: Russell Buchanan, male    DOB: 07/19/1975,  MRN: [id number]  Chief Complaint  Patient presents with   Callouses    Bilateral callus     46 y.o. male presents with the above complaint.  Patient presents with bilateral fifth metatarsal plantarflexed metatarsal with underlying porokeratotic lesion.  Patient states is painful to touch painful to walk on.  He wears regular shoes he does not wear any current insoles.  He would like to discuss treatment options.  Shaving down helps out.  He has not seen anyone else prior to seeing me for this.  He denies any other acute complaints.   Review of Systems: Negative except as noted in the HPI. Denies N/V/F/Ch.  Past Medical History:  Diagnosis Date   Antisocial personality disorder (HCC)    Bipolar disorder (HCC)    Environmental and seasonal allergies    GERD (gastroesophageal reflux disease)    Headache    Hyperlipidemia    Infected elbow (HCC)    Schizophrenia (HCC)    Seizures (HCC)    Tremors of nervous system     Current Outpatient Medications:    albuterol (PROVENTIL HFA;VENTOLIN HFA) 108 (90 Base) MCG/ACT inhaler, Inhale 2 puffs into the lungs every 6 (six) hours as needed for wheezing or shortness of breath., Disp: 1 Inhaler, Rfl: 0   ARIPiprazole (ABILIFY) 5 MG tablet, Take 5 mg by mouth daily., Disp: , Rfl:    benztropine (COGENTIN) 2 MG tablet, Take 2 mg by mouth 3 (three) times daily., Disp: , Rfl:    cetirizine (ZYRTEC) 10 MG tablet, Take 10 mg by mouth daily., Disp: , Rfl:    cholecalciferol (VITAMIN D) 25 MCG (1000 UNIT) tablet, Take 1,000 Units by mouth daily., Disp: , Rfl:    divalproex (DEPAKOTE) 500 MG DR tablet, Take 1 tablet (500 mg total) by mouth 3 (three) times daily., Disp: 90 tablet, Rfl: 0   docusate sodium (COLACE) 100 MG capsule, Take 100 mg by mouth daily., Disp: , Rfl:    EPINEPHrine 0.3 mg/0.3 mL IJ SOAJ injection, Inject 0.3 mg into the muscle as needed (allergic reactions). , Disp: ,  Rfl:    escitalopram (LEXAPRO) 20 MG tablet, Take 20 mg by mouth daily., Disp: , Rfl:    gabapentin (NEURONTIN) 600 MG tablet, Take 1 tablet (600 mg total) by mouth 3 (three) times daily., Disp: 90 tablet, Rfl: 0   gemfibrozil (LOPID) 600 MG tablet, Take 600 mg by mouth 2 (two) times daily before a meal., Disp: , Rfl:    ketoconazole (NIZORAL) 2 % cream, Apply 1 application topically 2 (two) times daily as needed for irritation., Disp: , Rfl:    Lacosamide 150 MG TABS, Take 1 tablet (150 mg total) by mouth 2 (two) times daily., Disp: 30 tablet, Rfl: 0   linaclotide (LINZESS) 72 MCG capsule, Take 72 mcg by mouth daily. , Disp: , Rfl:    loperamide (IMODIUM) 2 MG capsule, Take 2 mg by mouth See admin instructions. Take 2 for the first dose, then 1 after each loose bowel movement. Max of 8 in a day, Disp: , Rfl:    pantoprazole (PROTONIX) 40 MG tablet, Take 40 mg by mouth 2 (two) times daily., Disp: , Rfl:    polyethylene glycol (MIRALAX / GLYCOLAX) packet, Take 17 g by mouth daily., Disp: , Rfl:    pravastatin (PRAVACHOL) 40 MG tablet, Take 40 mg by mouth every evening., Disp: , Rfl:    propranolol (INNOPRAN XL)  120 MG 24 hr capsule, Take 120 mg by mouth daily at 12 noon., Disp: , Rfl:    simethicone (MYLICON) 80 MG chewable tablet, Chew 80 mg by mouth 2 (two) times daily., Disp: , Rfl:    SUMAtriptan (IMITREX) 100 MG tablet, Take 100 mg by mouth every 2 (two) hours as needed for migraine. May repeat in 2 hours if headache persists or recurs., Disp: , Rfl:    Tiotropium Bromide-Olodaterol (STIOLTO RESPIMAT IN), Inhale 2 puffs into the lungs daily., Disp: , Rfl:   Social History   Tobacco Use  Smoking Status Every Day   Packs/day: 0.50   Years: 7.00   Pack years: 3.50   Types: Cigarettes  Smokeless Tobacco Former   Types: Chew, Snuff    Allergies  Allergen Reactions   Bee Venom Hives   Objective:  There were no vitals filed for this visit. There is no height or weight on file to  calculate BMI. Constitutional Well developed. Well nourished.  Vascular Dorsalis pedis pulses palpable bilaterally. Posterior tibial pulses palpable bilaterally. Capillary refill normal to all digits.  No cyanosis or clubbing noted. Pedal hair growth normal.  Neurologic Normal speech. Oriented to person, place, and time. Epicritic sensation to light touch grossly present bilaterally.  Dermatologic Hyperkeratotic lesion to bilateral submetatarsal 5 noted.  Central nucleated core noted.  Pain on palpation to the lesion.  No pinpoint bleeding upon debridement.  Orthopedic: Normal joint ROM without pain or crepitus bilaterally. No visible deformities. No bony tenderness.   Radiographs: None Assessment:   1. Porokeratosis   2. Plantar flexed metatarsal bone of left foot   3. Plantar flexed metatarsal bone of right foot    Plan:  Patient was evaluated and treated and all questions answered.  Bilateral plantarflexed fifth metatarsal with underlying porokeratotic lesion -All questions and concerns were discussed with the patient in extensive detail -Using chisel blade to handle the lesion was debrided down to healthy striated tissue as a courtesy.  The pain was relieved upon debridement -I discussed shoe gear modification in extensive detail.  No follow-ups on file.

## 2021-10-05 ENCOUNTER — Ambulatory Visit (INDEPENDENT_AMBULATORY_CARE_PROVIDER_SITE_OTHER): Payer: Medicaid Other | Admitting: Podiatry

## 2021-10-05 ENCOUNTER — Encounter: Payer: Self-pay | Admitting: Podiatry

## 2021-10-05 DIAGNOSIS — Q828 Other specified congenital malformations of skin: Secondary | ICD-10-CM | POA: Diagnosis not present

## 2021-10-05 DIAGNOSIS — M79675 Pain in left toe(s): Secondary | ICD-10-CM | POA: Diagnosis not present

## 2021-10-05 DIAGNOSIS — M79674 Pain in right toe(s): Secondary | ICD-10-CM | POA: Diagnosis not present

## 2021-10-05 DIAGNOSIS — M216X2 Other acquired deformities of left foot: Secondary | ICD-10-CM | POA: Diagnosis not present

## 2021-10-05 DIAGNOSIS — B351 Tinea unguium: Secondary | ICD-10-CM | POA: Diagnosis not present

## 2021-10-05 NOTE — Progress Notes (Signed)
This patient returns to my office for at risk foot care.  This patient requires this care by a professional since this patient will be at risk due to having kidney disease and thrombocytopenia.  This patient is unable to cut nails himself since the patient cannot reach his nails.These nails are painful walking and wearing shoes. He also has painful callus left foot. This patient presents for at risk foot care today.  General Appearance  Alert, conversant and in no acute stress.  Vascular  Dorsalis pedis and posterior tibial  pulses are palpable  bilaterally.  Capillary return is within normal limits  bilaterally. Temperature is within normal limits  bilaterally.  Neurologic  Senn-Weinstein monofilament wire test within normal limits  bilaterally. Muscle power within normal limits bilaterally.  Nails Thick disfigured discolored nails with subungual debris  from hallux to fifth toes bilaterally. No evidence of bacterial infection or drainage bilaterally.  Orthopedic  No limitations of motion  feet .  No crepitus or effusions noted.  No bony pathology or digital deformities noted.  Skin  normotropic skin with no porokeratosis noted bilaterally.  No signs of infections or ulcers noted.   Callus  sub 1,5 left foot.  Onychomycosis  Pain in right toes  Pain in left toes  Callus left foot.  Consent was obtained for treatment procedures.   Mechanical debridement of nails 1-5  bilaterally performed with a nail nipper.  Filed with dremel without incident. Debride callus with # 15 blade.   Return office visit    3 months                 Told patient to return for periodic foot care and evaluation due to potential at risk complications.   Mariesha Venturella DPM   

## 2022-01-11 ENCOUNTER — Encounter: Payer: Self-pay | Admitting: Podiatry

## 2022-01-11 ENCOUNTER — Ambulatory Visit (INDEPENDENT_AMBULATORY_CARE_PROVIDER_SITE_OTHER): Payer: Medicaid Other | Admitting: Podiatry

## 2022-01-11 DIAGNOSIS — Q828 Other specified congenital malformations of skin: Secondary | ICD-10-CM

## 2022-01-11 DIAGNOSIS — M79675 Pain in left toe(s): Secondary | ICD-10-CM

## 2022-01-11 DIAGNOSIS — B351 Tinea unguium: Secondary | ICD-10-CM | POA: Diagnosis not present

## 2022-01-11 DIAGNOSIS — M79674 Pain in right toe(s): Secondary | ICD-10-CM

## 2022-01-11 DIAGNOSIS — M216X2 Other acquired deformities of left foot: Secondary | ICD-10-CM | POA: Diagnosis not present

## 2022-01-11 DIAGNOSIS — M216X1 Other acquired deformities of right foot: Secondary | ICD-10-CM

## 2022-01-11 NOTE — Progress Notes (Signed)
This patient returns to my office for at risk foot care.  This patient requires this care by a professional since this patient will be at risk due to having kidney disease and thrombocytopenia.  This patient is unable to cut nails himself since the patient cannot reach his nails.These nails are painful walking and wearing shoes. He also has painful callus left foot. This patient presents for at risk foot care today.  General Appearance  Alert, conversant and in no acute stress.  Vascular  Dorsalis pedis and posterior tibial  pulses are palpable  bilaterally.  Capillary return is within normal limits  bilaterally. Temperature is within normal limits  bilaterally.  Neurologic  Senn-Weinstein monofilament wire test within normal limits  bilaterally. Muscle power within normal limits bilaterally.  Nails Thick disfigured discolored nails with subungual debris  from hallux to fifth toes bilaterally. No evidence of bacterial infection or drainage bilaterally.  Orthopedic  No limitations of motion  feet .  No crepitus or effusions noted.  No bony pathology or digital deformities noted.  Skin  normotropic skin with no porokeratosis noted bilaterally.  No signs of infections or ulcers noted.   Callus  sub 1,5 left foot.  Onychomycosis  Pain in right toes  Pain in left toes  Callus left foot.  Consent was obtained for treatment procedures.   Mechanical debridement of nails 1-5  bilaterally performed with a nail nipper.  Filed with dremel without incident. Debride callus with # 15 blade.   Return office visit    3 months                 Told patient to return for periodic foot care and evaluation due to potential at risk complications.   Helane Gunther DPM

## 2022-02-15 ENCOUNTER — Ambulatory Visit (INDEPENDENT_AMBULATORY_CARE_PROVIDER_SITE_OTHER): Payer: Medicaid Other | Admitting: Podiatry

## 2022-02-15 DIAGNOSIS — Q828 Other specified congenital malformations of skin: Secondary | ICD-10-CM

## 2022-02-15 DIAGNOSIS — M216X2 Other acquired deformities of left foot: Secondary | ICD-10-CM | POA: Diagnosis not present

## 2022-02-15 NOTE — Progress Notes (Signed)
Subjective:  Patient ID: Russell Buchanan, male    DOB: Sep 17, 1975,  MRN: [id number]  Chief Complaint  Patient presents with   Callouses    46 y.o. male presents with the above complaint.  Patient presents with bilateral fifth metatarsal plantarflexed metatarsal with underlying porokeratotic lesion.  Patient states is painful to touch painful to walk on.  He wears regular shoes he does not wear any current insoles.  He would like to discuss treatment options.  Shaving down helps out.  He has not seen anyone else prior to seeing me for this.  He denies any other acute complaints.   Review of Systems: Negative except as noted in the HPI. Denies N/V/F/Ch.  Past Medical History:  Diagnosis Date   Antisocial personality disorder (HCC)    Bipolar disorder (HCC)    Environmental and seasonal allergies    GERD (gastroesophageal reflux disease)    Headache    Hyperlipidemia    Infected elbow (HCC)    Schizophrenia (HCC)    Seizures (HCC)    Tremors of nervous system     Current Outpatient Medications:    albuterol (PROVENTIL HFA;VENTOLIN HFA) 108 (90 Base) MCG/ACT inhaler, Inhale 2 puffs into the lungs every 6 (six) hours as needed for wheezing or shortness of breath., Disp: 1 Inhaler, Rfl: 0   ARIPiprazole (ABILIFY) 5 MG tablet, Take 5 mg by mouth daily., Disp: , Rfl:    benztropine (COGENTIN) 2 MG tablet, Take 2 mg by mouth 3 (three) times daily., Disp: , Rfl:    cetirizine (ZYRTEC) 10 MG tablet, Take 10 mg by mouth daily., Disp: , Rfl:    cholecalciferol (VITAMIN D) 25 MCG (1000 UNIT) tablet, Take 1,000 Units by mouth daily., Disp: , Rfl:    divalproex (DEPAKOTE) 500 MG DR tablet, Take 1 tablet (500 mg total) by mouth 3 (three) times daily., Disp: 90 tablet, Rfl: 0   docusate sodium (COLACE) 100 MG capsule, Take 100 mg by mouth daily., Disp: , Rfl:    EPINEPHrine 0.3 mg/0.3 mL IJ SOAJ injection, Inject 0.3 mg into the muscle as needed (allergic reactions). , Disp: , Rfl:    escitalopram  (LEXAPRO) 20 MG tablet, Take 20 mg by mouth daily., Disp: , Rfl:    gabapentin (NEURONTIN) 600 MG tablet, Take 1 tablet (600 mg total) by mouth 3 (three) times daily., Disp: 90 tablet, Rfl: 0   gemfibrozil (LOPID) 600 MG tablet, Take 600 mg by mouth 2 (two) times daily before a meal., Disp: , Rfl:    ketoconazole (NIZORAL) 2 % cream, Apply 1 application topically 2 (two) times daily as needed for irritation., Disp: , Rfl:    Lacosamide 150 MG TABS, Take 1 tablet (150 mg total) by mouth 2 (two) times daily., Disp: 30 tablet, Rfl: 0   linaclotide (LINZESS) 72 MCG capsule, Take 72 mcg by mouth daily. , Disp: , Rfl:    loperamide (IMODIUM) 2 MG capsule, Take 2 mg by mouth See admin instructions. Take 2 for the first dose, then 1 after each loose bowel movement. Max of 8 in a day, Disp: , Rfl:    pantoprazole (PROTONIX) 40 MG tablet, Take 40 mg by mouth 2 (two) times daily., Disp: , Rfl:    polyethylene glycol (MIRALAX / GLYCOLAX) packet, Take 17 g by mouth daily., Disp: , Rfl:    pravastatin (PRAVACHOL) 40 MG tablet, Take 40 mg by mouth every evening., Disp: , Rfl:    propranolol (INNOPRAN XL) 120 MG 24 hr capsule, Take  120 mg by mouth daily at 12 noon., Disp: , Rfl:    simethicone (MYLICON) 80 MG chewable tablet, Chew 80 mg by mouth 2 (two) times daily., Disp: , Rfl:    SUMAtriptan (IMITREX) 100 MG tablet, Take 100 mg by mouth every 2 (two) hours as needed for migraine. May repeat in 2 hours if headache persists or recurs., Disp: , Rfl:    Tiotropium Bromide-Olodaterol (STIOLTO RESPIMAT IN), Inhale 2 puffs into the lungs daily., Disp: , Rfl:   Social History   Tobacco Use  Smoking Status Every Day   Packs/day: 1.00   Years: 7.00   Total pack years: 7.00   Types: Cigarettes  Smokeless Tobacco Former   Types: Chew, Snuff    Allergies  Allergen Reactions   Bee Venom Hives   Objective:  There were no vitals filed for this visit. There is no height or weight on file to calculate  BMI. Constitutional Well developed. Well nourished.  Vascular Dorsalis pedis pulses palpable bilaterally. Posterior tibial pulses palpable bilaterally. Capillary refill normal to all digits.  No cyanosis or clubbing noted. Pedal hair growth normal.  Neurologic Normal speech. Oriented to person, place, and time. Epicritic sensation to light touch grossly present bilaterally.  Dermatologic Hyperkeratotic lesion to bilateral submetatarsal 5 noted.  Central nucleated core noted.  Pain on palpation to the lesion.  No pinpoint bleeding upon debridement.  Orthopedic: Normal joint ROM without pain or crepitus bilaterally. No visible deformities. No bony tenderness.   Radiographs: None Assessment:   No diagnosis found.  Plan:  Patient was evaluated and treated and all questions answered.  Bilateral plantarflexed fifth metatarsal with underlying porokeratotic lesion -All questions and concerns were discussed with the patient in extensive detail -Using chisel blade to handle the lesion was debrided down to healthy striated tissue as a courtesy.  The pain was relieved upon debridement -I discussed shoe gear modification in extensive detail.  No follow-ups on file.

## 2022-03-06 ENCOUNTER — Telehealth: Payer: Self-pay | Admitting: *Deleted

## 2022-03-09 NOTE — Telephone Encounter (Signed)
error 

## 2022-04-23 ENCOUNTER — Ambulatory Visit: Payer: Medicaid Other | Admitting: Podiatry

## 2022-05-10 ENCOUNTER — Ambulatory Visit (INDEPENDENT_AMBULATORY_CARE_PROVIDER_SITE_OTHER): Payer: Medicaid Other

## 2022-05-10 ENCOUNTER — Ambulatory Visit: Payer: Medicaid Other | Admitting: Podiatry

## 2022-05-10 DIAGNOSIS — M216X1 Other acquired deformities of right foot: Secondary | ICD-10-CM

## 2022-05-10 DIAGNOSIS — Z01818 Encounter for other preprocedural examination: Secondary | ICD-10-CM | POA: Diagnosis not present

## 2022-05-10 DIAGNOSIS — Q828 Other specified congenital malformations of skin: Secondary | ICD-10-CM | POA: Diagnosis not present

## 2022-05-10 DIAGNOSIS — M216X2 Other acquired deformities of left foot: Secondary | ICD-10-CM

## 2022-05-10 NOTE — Progress Notes (Signed)
Subjective:  Patient ID: Russell Buchanan, male    DOB: 08-Oct-1975,  MRN: [id number]  Chief Complaint  Patient presents with   Callouses    46 y.o. male presents with the above complaint.  Patient presents with bilateral foot plantarflexed fifth metatarsal that are very painful.  He would like to discuss surgical options at this time he has failed all conservative treatment options.  He states understanding.  He is not a diabetic.   Review of Systems: Negative except as noted in the HPI. Denies N/V/F/Ch.  Past Medical History:  Diagnosis Date   Antisocial personality disorder (HCC)    Bipolar disorder (HCC)    Environmental and seasonal allergies    GERD (gastroesophageal reflux disease)    Headache    Hyperlipidemia    Infected elbow (HCC)    Schizophrenia (HCC)    Seizures (HCC)    Tremors of nervous system     Current Outpatient Medications:    albuterol (PROVENTIL HFA;VENTOLIN HFA) 108 (90 Base) MCG/ACT inhaler, Inhale 2 puffs into the lungs every 6 (six) hours as needed for wheezing or shortness of breath., Disp: 1 Inhaler, Rfl: 0   ARIPiprazole (ABILIFY) 5 MG tablet, Take 5 mg by mouth daily., Disp: , Rfl:    benztropine (COGENTIN) 2 MG tablet, Take 2 mg by mouth 3 (three) times daily., Disp: , Rfl:    cetirizine (ZYRTEC) 10 MG tablet, Take 10 mg by mouth daily., Disp: , Rfl:    cholecalciferol (VITAMIN D) 25 MCG (1000 UNIT) tablet, Take 1,000 Units by mouth daily., Disp: , Rfl:    divalproex (DEPAKOTE) 500 MG DR tablet, Take 1 tablet (500 mg total) by mouth 3 (three) times daily., Disp: 90 tablet, Rfl: 0   docusate sodium (COLACE) 100 MG capsule, Take 100 mg by mouth daily., Disp: , Rfl:    EPINEPHrine 0.3 mg/0.3 mL IJ SOAJ injection, Inject 0.3 mg into the muscle as needed (allergic reactions). , Disp: , Rfl:    escitalopram (LEXAPRO) 20 MG tablet, Take 20 mg by mouth daily., Disp: , Rfl:    gabapentin (NEURONTIN) 600 MG tablet, Take 1 tablet (600 mg total) by mouth 3  (three) times daily., Disp: 90 tablet, Rfl: 0   gemfibrozil (LOPID) 600 MG tablet, Take 600 mg by mouth 2 (two) times daily before a meal., Disp: , Rfl:    ketoconazole (NIZORAL) 2 % cream, Apply 1 application topically 2 (two) times daily as needed for irritation., Disp: , Rfl:    Lacosamide 150 MG TABS, Take 1 tablet (150 mg total) by mouth 2 (two) times daily., Disp: 30 tablet, Rfl: 0   linaclotide (LINZESS) 72 MCG capsule, Take 72 mcg by mouth daily. , Disp: , Rfl:    loperamide (IMODIUM) 2 MG capsule, Take 2 mg by mouth See admin instructions. Take 2 for the first dose, then 1 after each loose bowel movement. Max of 8 in a day, Disp: , Rfl:    pantoprazole (PROTONIX) 40 MG tablet, Take 40 mg by mouth 2 (two) times daily., Disp: , Rfl:    polyethylene glycol (MIRALAX / GLYCOLAX) packet, Take 17 g by mouth daily., Disp: , Rfl:    pravastatin (PRAVACHOL) 40 MG tablet, Take 40 mg by mouth every evening., Disp: , Rfl:    propranolol (INNOPRAN XL) 120 MG 24 hr capsule, Take 120 mg by mouth daily at 12 noon., Disp: , Rfl:    simethicone (MYLICON) 80 MG chewable tablet, Chew 80 mg by mouth 2 (two) times  daily., Disp: , Rfl:    SUMAtriptan (IMITREX) 100 MG tablet, Take 100 mg by mouth every 2 (two) hours as needed for migraine. May repeat in 2 hours if headache persists or recurs., Disp: , Rfl:    Tiotropium Bromide-Olodaterol (STIOLTO RESPIMAT IN), Inhale 2 puffs into the lungs daily., Disp: , Rfl:   Social History   Tobacco Use  Smoking Status Every Day   Packs/day: 1.00   Years: 7.00   Total pack years: 7.00   Types: Cigarettes  Smokeless Tobacco Former   Types: Chew, Snuff    Allergies  Allergen Reactions   Bee Venom Hives   Objective:  There were no vitals filed for this visit. There is no height or weight on file to calculate BMI. Constitutional Well developed. Well nourished.  Vascular Dorsalis pedis pulses palpable bilaterally. Posterior tibial pulses palpable  bilaterally. Capillary refill normal to all digits.  No cyanosis or clubbing noted. Pedal hair growth normal.  Neurologic Normal speech. Oriented to person, place, and time. Epicritic sensation to light touch grossly present bilaterally.  Dermatologic Hyperkeratotic lesion to bilateral submetatarsal 5 noted.  Central nucleated core noted.  Pain on palpation to the lesion.  No pinpoint bleeding upon debridement.  Orthopedic: Normal joint ROM without pain or crepitus bilaterally. No visible deformities. No bony tenderness.   Radiographs: 3 views of skeletally mature the bilateral foot: Plantarflexed fifth metatarsal noted no other bony abnormalities identified Assessment:   1. Porokeratosis     Plan:  Patient was evaluated and treated and all questions answered.  Bilateral plantarflexed fifth metatarsal with underlying porokeratotic lesion -All questions and concerns were discussed with the patient in extensive detail -Using chisel blade to handle the lesion was debrided down to healthy striated tissue as a courtesy.  The pain was relieved upon debridement -I explained to the patient that given that he has failed all conservative treatment options include shoe gear modification and padding protecting debridement of the lesion I believe he will benefit from surgical floating osteotomy of bilateral fifth metatarsal.  He would like to do both feet at the same time.  He will be weightbearing as tolerated in surgical shoe bilaterally.  He denies any other acute complaints.  At this time he would like to to undergo surgery.  I discussed my preoperative intraoperative postoperative plan in extensive detail he states understanding -Informed surgical risk consent was reviewed and read aloud to the patient.  I reviewed the films.  I have discussed my findings with the patient in great detail.  I have discussed all risks including but not limited to infection, stiffness, scarring, limp, disability,  deformity, damage to blood vessels and nerves, numbness, poor healing, need for braces, arthritis, chronic pain, amputation, death.  All benefits and realistic expectations discussed in great detail.  I have made no promises as to the outcome.  I have provided realistic expectations.  I have offered the patient a 2nd opinion, which they have declined and assured me they preferred to proceed despite the risks   No follow-ups on file.

## 2022-05-21 ENCOUNTER — Ambulatory Visit: Payer: Medicaid Other | Admitting: Podiatry

## 2022-05-22 ENCOUNTER — Ambulatory Visit: Payer: Medicaid Other | Admitting: Podiatry

## 2022-06-06 ENCOUNTER — Telehealth: Payer: Self-pay | Admitting: Podiatry

## 2022-06-06 NOTE — Telephone Encounter (Signed)
DOS: 07/02/2022  [place] [employer] Industry Access  Metatarsal Osteotomy 5th B/L ([id number])  Prior authorization is not required for patients insurance.

## 2022-06-21 ENCOUNTER — Telehealth: Payer: Self-pay

## 2022-06-21 NOTE — Telephone Encounter (Signed)
Russell Buchanan called to cancel his surgery with Dr. Posey Pronto on 07/02/2022. He stated he would like to wait till next year. Notified Dr. Posey Pronto and Caren Griffins with [facility]

## 2022-07-13 ENCOUNTER — Encounter: Payer: Medicaid Other | Admitting: Podiatry

## 2022-07-27 ENCOUNTER — Encounter: Payer: Medicaid Other | Admitting: Podiatry

## 2023-04-16 ENCOUNTER — Encounter: Payer: Self-pay | Admitting: Podiatry

## 2023-04-16 ENCOUNTER — Ambulatory Visit: Payer: MEDICAID | Admitting: Podiatry

## 2023-04-16 VITALS — Ht 72.0 in | Wt 188.0 lb

## 2023-04-16 DIAGNOSIS — Q828 Other specified congenital malformations of skin: Secondary | ICD-10-CM

## 2023-04-16 DIAGNOSIS — D492 Neoplasm of unspecified behavior of bone, soft tissue, and skin: Secondary | ICD-10-CM | POA: Diagnosis not present

## 2023-04-16 NOTE — Progress Notes (Addendum)
Subjective:  Patient ID: Russell Buchanan, male    DOB: April 15, 1976,  MRN: [id number]  Chief Complaint  Patient presents with   Callouses    Pt is here due to callouses on foot which is causing pain.    47 y.o. male presents with the above complaint.  Patient presents with bilateral fifth metatarsal plantarflexed metatarsal with underlying porokeratotic lesion.  Patient states is painful to touch painful to walk on.  He wears regular shoes he does not wear any current insoles.  He would like to discuss treatment options.  Shaving down helps out.  He has not seen anyone else prior to seeing me for this.  He denies any other acute complaints.   Review of Systems: Negative except as noted in the HPI. Denies N/V/F/Ch.  Past Medical History:  Diagnosis Date   Antisocial personality disorder (HCC)    Bipolar disorder (HCC)    Environmental and seasonal allergies    GERD (gastroesophageal reflux disease)    Headache    Hyperlipidemia    Infected elbow (HCC)    Schizophrenia (HCC)    Seizures (HCC)    Tremors of nervous system     Current Outpatient Medications:    albuterol (PROVENTIL HFA;VENTOLIN HFA) 108 (90 Base) MCG/ACT inhaler, Inhale 2 puffs into the lungs every 6 (six) hours as needed for wheezing or shortness of breath., Disp: 1 Inhaler, Rfl: 0   ARIPiprazole (ABILIFY) 5 MG tablet, Take 5 mg by mouth daily., Disp: , Rfl:    benztropine (COGENTIN) 2 MG tablet, Take 2 mg by mouth 3 (three) times daily., Disp: , Rfl:    cetirizine (ZYRTEC) 10 MG tablet, Take 10 mg by mouth daily., Disp: , Rfl:    cholecalciferol (VITAMIN D) 25 MCG (1000 UNIT) tablet, Take 1,000 Units by mouth daily., Disp: , Rfl:    divalproex (DEPAKOTE) 500 MG DR tablet, Take 1 tablet (500 mg total) by mouth 3 (three) times daily., Disp: 90 tablet, Rfl: 0   docusate sodium (COLACE) 100 MG capsule, Take 100 mg by mouth daily., Disp: , Rfl:    EPINEPHrine 0.3 mg/0.3 mL IJ SOAJ injection, Inject 0.3 mg into the muscle  as needed (allergic reactions). , Disp: , Rfl:    escitalopram (LEXAPRO) 20 MG tablet, Take 20 mg by mouth daily., Disp: , Rfl:    gabapentin (NEURONTIN) 600 MG tablet, Take 1 tablet (600 mg total) by mouth 3 (three) times daily., Disp: 90 tablet, Rfl: 0   gemfibrozil (LOPID) 600 MG tablet, Take 600 mg by mouth 2 (two) times daily before a meal., Disp: , Rfl:    ketoconazole (NIZORAL) 2 % cream, Apply 1 application topically 2 (two) times daily as needed for irritation., Disp: , Rfl:    Lacosamide 150 MG TABS, Take 1 tablet (150 mg total) by mouth 2 (two) times daily., Disp: 30 tablet, Rfl: 0   linaclotide (LINZESS) 72 MCG capsule, Take 72 mcg by mouth daily. , Disp: , Rfl:    loperamide (IMODIUM) 2 MG capsule, Take 2 mg by mouth See admin instructions. Take 2 for the first dose, then 1 after each loose bowel movement. Max of 8 in a day, Disp: , Rfl:    pantoprazole (PROTONIX) 40 MG tablet, Take 40 mg by mouth 2 (two) times daily., Disp: , Rfl:    polyethylene glycol (MIRALAX / GLYCOLAX) packet, Take 17 g by mouth daily., Disp: , Rfl:    pravastatin (PRAVACHOL) 40 MG tablet, Take 40 mg by mouth every evening.,  Disp: , Rfl:    propranolol (INNOPRAN XL) 120 MG 24 hr capsule, Take 120 mg by mouth daily at 12 noon., Disp: , Rfl:    simethicone (MYLICON) 80 MG chewable tablet, Chew 80 mg by mouth 2 (two) times daily., Disp: , Rfl:    SUMAtriptan (IMITREX) 100 MG tablet, Take 100 mg by mouth every 2 (two) hours as needed for migraine. May repeat in 2 hours if headache persists or recurs., Disp: , Rfl:    Tiotropium Bromide-Olodaterol (STIOLTO RESPIMAT IN), Inhale 2 puffs into the lungs daily., Disp: , Rfl:   Social History   Tobacco Use  Smoking Status Every Day   Current packs/day: 1.00   Average packs/day: 1 pack/day for 7.0 years (7.0 ttl pk-yrs)   Types: Cigarettes  Smokeless Tobacco Former   Types: Chew, Snuff    Allergies  Allergen Reactions   Bee Venom Hives   Objective:  There were  no vitals filed for this visit. Body mass index is 25.5 kg/m. Constitutional Well developed. Well nourished.  Vascular Dorsalis pedis pulses palpable bilaterally. Posterior tibial pulses palpable bilaterally. Capillary refill normal to all digits.  No cyanosis or clubbing noted. Pedal hair growth normal.  Neurologic Normal speech. Oriented to person, place, and time. Epicritic sensation to light touch grossly present bilaterally.  Dermatologic Hyperkeratotic lesion to bilateral submetatarsal 5 noted.  Central nucleated core noted.  Pain on palpation to the lesion.  No pinpoint bleeding upon debridement.  Orthopedic: Normal joint ROM without pain or crepitus bilaterally. No visible deformities. No bony tenderness.   Radiographs: None Assessment:   1. Porokeratosis     Plan:  Patient was evaluated and treated and all questions answered.  Bilateral plantarflexed fifth metatarsal with underlying porokeratotic lesion/skin neoplasm -All questions and concerns were discussed with the patient in extensive detail -Using chisel blade to handle the lesion was debrided down to healthy striated tissue as a courtesy.  The pain was relieved upon debridement -I discussed shoe gear modification in extensive detail.  No follow-ups on file.

## 2023-07-25 ENCOUNTER — Ambulatory Visit: Payer: MEDICAID | Admitting: Podiatry

## 2023-08-01 ENCOUNTER — Ambulatory Visit (INDEPENDENT_AMBULATORY_CARE_PROVIDER_SITE_OTHER): Payer: MEDICAID | Admitting: Podiatry

## 2023-08-01 ENCOUNTER — Encounter: Payer: Self-pay | Admitting: Podiatry

## 2023-08-01 DIAGNOSIS — D492 Neoplasm of unspecified behavior of bone, soft tissue, and skin: Secondary | ICD-10-CM

## 2023-08-01 NOTE — Progress Notes (Signed)
 Subjective:  Patient ID: Russell Buchanan, male    DOB: 06-Nov-1975,  MRN: [id number]  Chief Complaint  Patient presents with   Nail Problem    "Calluses and nails"    48 y.o. male presents with the above complaint.  Patient presents with bilateral fifth metatarsal plantarflexed metatarsal with underlying porokeratotic lesion.  Patient states is painful to touch painful to walk on.  He wears regular shoes he does not wear any current insoles.  He would like to discuss treatment options.  Shaving down helps out.  He has not seen anyone else prior to seeing me for this.  He denies any other acute complaints.   Review of Systems: Negative except as noted in the HPI. Denies N/V/F/Ch.  Past Medical History:  Diagnosis Date   Antisocial personality disorder (HCC)    Bipolar disorder (HCC)    Environmental and seasonal allergies    GERD (gastroesophageal reflux disease)    Headache    Hyperlipidemia    Infected elbow (HCC)    Schizophrenia (HCC)    Seizures (HCC)    Tremors of nervous system     Current Outpatient Medications:    albuterol (PROVENTIL HFA;VENTOLIN HFA) 108 (90 Base) MCG/ACT inhaler, Inhale 2 puffs into the lungs every 6 (six) hours as needed for wheezing or shortness of breath., Disp: 1 Inhaler, Rfl: 0   ARIPiprazole (ABILIFY) 5 MG tablet, Take 5 mg by mouth daily., Disp: , Rfl:    benztropine (COGENTIN) 2 MG tablet, Take 2 mg by mouth 3 (three) times daily., Disp: , Rfl:    cetirizine (ZYRTEC) 10 MG tablet, Take 10 mg by mouth daily., Disp: , Rfl:    cholecalciferol (VITAMIN D) 25 MCG (1000 UNIT) tablet, Take 1,000 Units by mouth daily., Disp: , Rfl:    divalproex (DEPAKOTE) 500 MG DR tablet, Take 1 tablet (500 mg total) by mouth 3 (three) times daily., Disp: 90 tablet, Rfl: 0   docusate sodium (COLACE) 100 MG capsule, Take 100 mg by mouth daily., Disp: , Rfl:    EPINEPHrine 0.3 mg/0.3 mL IJ SOAJ injection, Inject 0.3 mg into the muscle as needed (allergic reactions). ,  Disp: , Rfl:    escitalopram (LEXAPRO) 20 MG tablet, Take 20 mg by mouth daily., Disp: , Rfl:    gabapentin (NEURONTIN) 600 MG tablet, Take 1 tablet (600 mg total) by mouth 3 (three) times daily., Disp: 90 tablet, Rfl: 0   gemfibrozil (LOPID) 600 MG tablet, Take 600 mg by mouth 2 (two) times daily before a meal., Disp: , Rfl:    ketoconazole (NIZORAL) 2 % cream, Apply 1 application topically 2 (two) times daily as needed for irritation., Disp: , Rfl:    Lacosamide 150 MG TABS, Take 1 tablet (150 mg total) by mouth 2 (two) times daily., Disp: 30 tablet, Rfl: 0   linaclotide (LINZESS) 72 MCG capsule, Take 72 mcg by mouth daily. , Disp: , Rfl:    loperamide (IMODIUM) 2 MG capsule, Take 2 mg by mouth See admin instructions. Take 2 for the first dose, then 1 after each loose bowel movement. Max of 8 in a day, Disp: , Rfl:    pantoprazole (PROTONIX) 40 MG tablet, Take 40 mg by mouth 2 (two) times daily., Disp: , Rfl:    polyethylene glycol (MIRALAX / GLYCOLAX) packet, Take 17 g by mouth daily., Disp: , Rfl:    pravastatin (PRAVACHOL) 40 MG tablet, Take 40 mg by mouth every evening., Disp: , Rfl:    propranolol [facility]  XL) 120 MG 24 hr capsule, Take 120 mg by mouth daily at 12 noon., Disp: , Rfl:    simethicone (MYLICON) 80 MG chewable tablet, Chew 80 mg by mouth 2 (two) times daily., Disp: , Rfl:    SUMAtriptan (IMITREX) 100 MG tablet, Take 100 mg by mouth every 2 (two) hours as needed for migraine. May repeat in 2 hours if headache persists or recurs., Disp: , Rfl:    Tiotropium Bromide-Olodaterol (STIOLTO RESPIMAT IN), Inhale 2 puffs into the lungs daily., Disp: , Rfl:   Social History   Tobacco Use  Smoking Status Every Day   Current packs/day: 1.00   Average packs/day: 1 pack/day for 7.0 years (7.0 ttl pk-yrs)   Types: Cigarettes  Smokeless Tobacco Former   Types: Chew, Snuff    Allergies  Allergen Reactions   Bee Venom Hives   Objective:  There were no vitals filed for this  visit. There is no height or weight on file to calculate BMI. Constitutional Well developed. Well nourished.  Vascular Dorsalis pedis pulses palpable bilaterally. Posterior tibial pulses palpable bilaterally. Capillary refill normal to all digits.  No cyanosis or clubbing noted. Pedal hair growth normal.  Neurologic Normal speech. Oriented to person, place, and time. Epicritic sensation to light touch grossly present bilaterally.  Dermatologic Hyperkeratotic lesion to bilateral submetatarsal 5 noted.  Central nucleated core noted.  Pain on palpation to the lesion.  No pinpoint bleeding upon debridement.  Orthopedic: Normal joint ROM without pain or crepitus bilaterally. No visible deformities. No bony tenderness.   Radiographs: None Assessment:   No diagnosis found.   Plan:  Patient was evaluated and treated and all questions answered.  Bilateral plantarflexed fifth metatarsal with underlying porokeratotic lesion/skin neoplasm -All questions and concerns were discussed with the patient in extensive detail -Using chisel blade to handle the lesion was debrided down to healthy striated tissue as a courtesy.  The pain was relieved upon debridement -I discussed shoe gear modification in extensive detail.  No follow-ups on file.

## 2023-09-26 ENCOUNTER — Emergency Department
Admission: EM | Admit: 2023-09-26 | Discharge: 2023-09-27 | Disposition: A | Discharge status: Home / Self Care | Payer: MEDICAID | Attending: Emergency Medicine | Admitting: Emergency Medicine

## 2023-09-26 ENCOUNTER — Other Ambulatory Visit: Payer: Self-pay

## 2023-09-26 ENCOUNTER — Encounter: Payer: Self-pay | Admitting: *Deleted

## 2023-09-26 DIAGNOSIS — F209 Schizophrenia, unspecified: Secondary | ICD-10-CM | POA: Insufficient documentation

## 2023-09-26 DIAGNOSIS — F419 Anxiety disorder, unspecified: Secondary | ICD-10-CM | POA: Diagnosis not present

## 2023-09-26 DIAGNOSIS — R45851 Suicidal ideations: Secondary | ICD-10-CM | POA: Insufficient documentation

## 2023-09-26 LAB — SALICYLATE LEVEL: Salicylate Lvl: <7 mg/dL — ABNORMAL LOW (ref 7.0–30.0)

## 2023-09-26 LAB — COMPREHENSIVE METABOLIC PANEL WITH GFR
ALT: 19 U/L (ref 0–44)
AST: 22 U/L (ref 15–41)
Albumin: 4.3 g/dL (ref 3.5–5.0)
Alkaline Phosphatase: 82 U/L (ref 38–126)
Anion gap: 12 (ref 5–15)
BUN: 10 mg/dL (ref 6–20)
CO2: 26 mmol/L (ref 22–32)
Calcium: 9.6 mg/dL (ref 8.9–10.3)
Chloride: 91 mmol/L — ABNORMAL LOW (ref 98–111)
Creatinine, Ser: 0.69 mg/dL (ref 0.61–1.24)
GFR, Estimated: >60 mL/min (ref >60)
Glucose, Bld: 103 mg/dL — ABNORMAL HIGH (ref 70–99)
Potassium: 4 mmol/L (ref 3.5–5.1)
Sodium: 129 mmol/L — ABNORMAL LOW (ref 135–145)
Total Bilirubin: 0.8 mg/dL (ref 0.0–1.2)
Total Protein: 7.4 g/dL (ref 6.5–8.1)

## 2023-09-26 LAB — URINE DRUG SCREEN, QUALITATIVE (ARMC ONLY)
Amphetamines, Ur Screen: NOT DETECTED
Barbiturates, Ur Screen: NOT DETECTED
Benzodiazepine, Ur Scrn: NOT DETECTED
Cannabinoid 50 Ng, Ur ~~LOC~~: NOT DETECTED
Cocaine Metabolite,Ur ~~LOC~~: NOT DETECTED
MDMA (Ecstasy)Ur Screen: NOT DETECTED
Methadone Scn, Ur: NOT DETECTED
Opiate, Ur Screen: NOT DETECTED
Phencyclidine (PCP) Ur S: NOT DETECTED
Tricyclic, Ur Screen: NOT DETECTED

## 2023-09-26 LAB — CBC
HCT: 44 % (ref 39.0–52.0)
Hemoglobin: 15.5 g/dL (ref 13.0–17.0)
MCH: 31 pg (ref 26.0–34.0)
MCHC: 35.2 g/dL (ref 30.0–36.0)
MCV: 88 fL (ref 80.0–100.0)
Platelets: 276 10*3/uL (ref 150–400)
RBC: 5 MIL/uL (ref 4.22–5.81)
RDW: 12.4 % (ref 11.5–15.5)
WBC: 5.7 10*3/uL (ref 4.0–10.5)
nRBC: 0 % (ref 0.0–0.2)

## 2023-09-26 LAB — ACETAMINOPHEN LEVEL: Acetaminophen (Tylenol), Serum: <10 ug/mL — ABNORMAL LOW (ref 10–30)

## 2023-09-26 LAB — ETHANOL: Alcohol, Ethyl (B): <15 mg/dL (ref <15)

## 2023-09-26 MED ORDER — LORAZEPAM 1 MG PO TABS
1.0000 mg | ORAL_TABLET | Freq: Once | ORAL | Status: AC
  Administered 2023-09-26: 1 mg via ORAL
  Filled 2023-09-26: qty 1

## 2023-09-26 NOTE — ED Notes (Signed)
Provided pt evening snack

## 2023-09-26 NOTE — ED Triage Notes (Signed)
 Pt brought in by [employer]   pt is IVC   pt was at [facility].  Pt lives in a group home is [place]  pt denies HI.  Pt denies drugs or etoh use.  Pt states he was thinking about jumping off a bridge.  Pt calm and cooperative  pt alert.

## 2023-09-26 NOTE — ED Notes (Signed)
 Black watch Teacher, English as a foreign language boxers Black socks Blue sweats White tee

## 2023-09-26 NOTE — ED Notes (Signed)
ivc by [facility]/psych consult ordered/pending.. 
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
## 2023-09-26 NOTE — ED Provider Notes (Signed)
   [facility] Provider Note    Event Date/Time   First MD Initiated Contact with Patient 09/26/23 1933     (approximate)  History   Chief Complaint: Psychiatric Evaluation  HPI  Alder Murri is a 48 y.o. male with a past medical history of antisocial disorder, bipolar, hyperlipidemia, schizophrenia, presents to the emergency department from his group facility for suicidal ideation.  According to the patient patient lives at a group home, is coming from [facility] states he was having thoughts of jumping off a bridge to kill himself.  Patient states he has been very stressed recently.  Patient was placed under an IVC at [facility] and brought to the emergency department for further treatment.  Here the patient is calm and cooperative.  States he has been under a lot of stress lately and is feeling very anxious.  Patient states he has had thoughts of hurting himself although denies ever acting on these thoughts in the past.  Denies alcohol or drugs.  No medical complaints.  Physical Exam   Triage Vital Signs: ED Triage Vitals  Encounter Vitals Group     BP 09/26/23 1934 (!) 141/79     Systolic BP Percentile --      Diastolic BP Percentile --      Pulse Rate 09/26/23 1934 63     Resp 09/26/23 1934 18     Temp 09/26/23 1934 98.4 F (36.9 C)     Temp Source 09/26/23 1934 Oral     SpO2 09/26/23 1934 96 %     Weight 09/26/23 1922 188 lb (85.3 kg)     Height 09/26/23 1922 5\' 11"  (1.803 m)     Head Circumference --      Peak Flow --      Pain Score 09/26/23 1922 0     Pain Loc --      Pain Education --      Exclude from Growth Chart --     Most recent vital signs: Vitals:   09/26/23 1934  BP: (!) 141/79  Pulse: 63  Resp: 18  Temp: 98.4 F (36.9 C)  SpO2: 96%    General: Awake, no distress.  CV:  Good peripheral perfusion.  Regular rate and rhythm  Resp:  Normal effort.  Equal breath sounds bilaterally.  Abd:  No distention.  Soft, nontender.    ED Results /  Procedures / Treatments   MEDICATIONS ORDERED IN ED: Medications - No data to display   IMPRESSION / MDM / ASSESSMENT AND PLAN / ED COURSE  I reviewed the triage vital signs and the nursing notes.  Patient's presentation is most consistent with acute presentation with potential threat to life or bodily function.  Patient presents emergency department for suicidal ideation placed under an IVC by [facility].  We will maintain the IVC in the emergency department.  Will check labs.  Will have psychiatry and TTS evaluate.  Patient agreeable to plan of care.  Will dose a small dose of oral Ativan  while awaiting psychiatric evaluation.  Patient's lab work shows a negative urine drug screen, negative ethanol salicylate and acetaminophen  levels.  Chemistry shows no significant findings slight hyponatremia.  Patient CBC is normal.  Awaiting psychiatric evaluation, medically cleared at this time.  FINAL CLINICAL IMPRESSION(S) / ED DIAGNOSES   Suicidal ideation Anxiety    Note:  This document was prepared using Dragon voice recognition software and may include unintentional dictation errors.   Ruth Cove, MD 09/26/23 2326

## 2023-09-27 DIAGNOSIS — F209 Schizophrenia, unspecified: Secondary | ICD-10-CM

## 2023-09-27 MED ORDER — ARIPIPRAZOLE 10 MG PO TABS: 10.0000 mg | ORAL_TABLET | Freq: Every day | ORAL | 0 refills | Status: AC

## 2023-09-27 MED ORDER — ARIPIPRAZOLE 10 MG PO TABS
10.0000 mg | ORAL_TABLET | ORAL | Status: AC
  Administered 2023-09-27: 10 mg via ORAL
  Filled 2023-09-27: qty 1

## 2023-09-27 NOTE — ED Provider Notes (Signed)
 Emergency Medicine Observation Re-evaluation Note  Russell Buchanan is a 48 y.o. male, seen on rounds today.  Pt initially presented to the ED for complaints of Psychiatric Evaluation  Currently, the patient is calm, no acute complaints.  Physical Exam  Blood pressure 122/82, pulse 67, temperature 98.6 F (37 C), temperature source Oral, resp. rate 17, height 5\' 11"  (1.803 m), weight 85.3 kg, SpO2 99%. Physical Exam General: NAD Lungs: CTAB Psych: not agitated  ED Course / MDM  EKG:    I have reviewed the labs performed to date as well as medications administered while in observation.  Recent changes in the last 24 hours include no acute events overnight.  Seen by psychiatry this morning who finds him to be low risk with schizophrenia symptoms without acute psychosis.  They recommend increase Abilify  to 10 mg daily.  I have rescinded his IVC paperwork and prescribed higher dose of Abilify .  Plan  Current plan is for discharge to facility. Patient is not under full IVC at this time.   Jacquie Maudlin, MD 09/27/23 [phone]

## 2023-09-27 NOTE — ED Notes (Signed)
 Patient resting in bed at this time with eyes closed. Equal chest rise and fall noted.

## 2023-09-27 NOTE — Consult Note (Signed)
 [facility] Consult Note  Patient Name: Russell Buchanan MRN: [id number] DOB: 04/28/1976 DATE OF Consult: 09/27/2023  PRIMARY PSYCHIATRIC DIAGNOSES  1.  Schizophrenia    RECOMMENDATIONS  Recommendations: Medication recommendations: Recommend increasing Abilify  from 5mg  to 10mg  po daily for auditory hallucinations; continue Invega LAI every 3 months for schizophrenia; continue Lexapro  20mg  po daily for depression Non-Medication/therapeutic recommendations: Follow-up with outpatient psychiatrist; crisis line information; ED return precautions  Is inpatient psychiatric hospitalization recommended for this patient? No (Explain why): Denies suicidal and homicidal ideation, has outpatient follow-up Follow-Up Telepsychiatry C/L services: We will sign off for now. Please re-consult our service if needed for any concerning changes in the patient's condition, discharge planning, or questions. Communication: Treatment team members (and family members if applicable) who were involved in treatment/care discussions and planning, and with whom we spoke or engaged with via secure text/chat, include the following: Allyson, Calvin   Russell Buchanan is a 48 year old male with a history of antisocial personality disorder, schizophrenia, bipolar disorder, seizure disorder who presents from his group home on an IVC due to command auditory hallucinations to kill himself, yesterday reportedly went to a bridge with thoughts to jump off, did not act on them. Chart reviewed, UDS negative. Psychiatry consulted for disposition recommendations. On evaluation, patient noted to be calm, cooperative, euthymic, linear, not appearing internally preoccupied, not responding to internal stimuli, religiously preoccupied, alert and oriented x 4. Patient states yesterday he was experiencing increasing frustration that he was having auditory hallucinations of cursing. Denies command hallucinations. Reports he briefly had thoughts to  jump from a bridge. Denies that he had suicidal intent. Denies current suicidal ideation, intent, plan. Reports he no longer feels frustrated or angry. Denies that the voices are currently cursing at him, denies auditory hallucinations at present. Patient reports he feels safe to return home and would tell staff if he had suicidal thoughts. The patient's presentation is consistent with schizophrenia with chronic auditory hallucinations. Patient denies suicidal ideation, intent, plan. Patient is currently at low risk for suicide due to denies suicidal ideation, future oriented, identifies his religious beliefs as protective, social support, history of managing stress without engaging in suicidal behavior. Therefore, inpatient psychiatric hospitalization is not recommended.   Thank you for involving us  in the care of this patient. If you have any additional questions or concerns, please call [phone] and ask for me or the provider on-call.  TELEPSYCHIATRY ATTESTATION & CONSENT  As the provider for this telehealth consult, I attest that I verified the patient's identity using two separate identifiers, introduced myself to the patient, provided my credentials, disclosed my location, and performed this encounter via a HIPAA-compliant, real-time, face-to-face, two-way, interactive audio and video platform and with the full consent and agreement of the patient (or guardian as applicable.)  Patient physical location: ED in [facility]  Telehealth provider physical location: home office in state of [place]    Video start time: 848 AM EST Video end time: 901 AM EST  IDENTIFYING DATA  Russell Buchanan is a 48 y.o. year-old male for whom a psychiatric consultation has been ordered by the primary provider. The patient was identified using two separate identifiers.  CHIEF COMPLAINT/REASON FOR CONSULT  Suicidal ideation    HISTORY OF PRESENT ILLNESS (HPI)  Russell Buchanan is a 48 year old male with a  history of antisocial personality disorder, schizophrenia, bipolar disorder, seizure disorder who presents from his group home on an IVC due to command auditory hallucinations to kill himself, yesterday reportedly went  to a bridge with thoughts to jump off, did not act on them. Chart reviewed, UDS negative. Psychiatry consulted for disposition recommendations.   On evaluation, patient noted to be calm, cooperative, euthymic, linear, not appearing internally preoccupied, not responding to internal stimuli, religiously preoccupied, alert and oriented x 4. Patient states he is in the ED because, "I had some suicidal thoughts." Patient reports yesterday he was "aggravated and frustrated." Patient states that he was aggravated by "curse words." Patient reports yesterday the voices in his head were "cursing at me." States the voices have been cursing at him for 20 years. Reports he no longer feels frustrated or angry. Denies that the voices are currently cursing at him, denies auditory hallucinations at present. He states that yesterday because of the voices cursing at him he had thoughts to end his life by jumping from a bridge. Denies that he had suicidal intent. Patient states he had suicidal thoughts yesterday only and has not had the thoughts before yesterday or since. States, "I had a thought to end my life and I just expressed it." He states, "I don't have that thought anymore." He states, "I had the thought in my mind. I told my staff." Patient denies that he had command hallucinations and denies current command hallucinations. Patient states, "I would never kill myself. It's my belief in God. Life is too precious." Patient states he reads the new testament daily. States he believes he hears voices due to the new testament. Patient denies current active and passive suicidal ideation, intent, plan. Patient denies prior suicide attempts. Patient states, "the only thing that changes the conversation in my mind is  reading the word." Patient denies depressed mood, other depressive symptoms. Denies symptoms consistent with mania/hypomania, paranoia, homicidal ideation.  Patient states he enjoys walking and reading the bible. Patient reports he gets an Invega injection every 3 months. Patient reports he feels safe to return home and would tell staff if he had suicidal thoughts.    PAST PSYCHIATRIC HISTORY  Suicide attempts: Denies Outpatient: Has a psychiatrist  Inpatient: Denies Prior psych meds: Does not recall  Trauma/Abuse/neglect: Yes  Otherwise as per HPI above.  PAST MEDICAL HISTORY  Past Medical History:  Diagnosis Date   Antisocial personality disorder (HCC)    Bipolar disorder (HCC)    Environmental and seasonal allergies    GERD (gastroesophageal reflux disease)    Headache    Hyperlipidemia    Infected elbow (HCC)    Schizophrenia (HCC)    Seizures (HCC)    Tremors of nervous system      HOME MEDICATIONS  Facility Ordered Medications  Medication   [COMPLETED] LORazepam  (ATIVAN ) tablet 1 mg   PTA Medications  Medication Sig   EPINEPHrine  0.3 mg/0.3 mL IJ SOAJ injection Inject 0.3 mg into the muscle as needed (allergic reactions).    escitalopram  (LEXAPRO ) 20 MG tablet Take 20 mg by mouth daily.   docusate sodium  (COLACE) 100 MG capsule Take 100 mg by mouth daily.   gemfibrozil  (LOPID ) 600 MG tablet Take 600 mg by mouth 2 (two) times daily before a meal.   ketoconazole (NIZORAL) 2 % cream Apply 1 application topically 2 (two) times daily as needed for irritation.   cetirizine (ZYRTEC) 10 MG tablet Take 10 mg by mouth daily.   linaclotide  (LINZESS ) 72 MCG capsule Take 72 mcg by mouth daily.    albuterol  (PROVENTIL  HFA;VENTOLIN  HFA) 108 (90 Base) MCG/ACT inhaler Inhale 2 puffs into the lungs every 6 (six) hours as  needed for wheezing or shortness of breath.   polyethylene glycol (MIRALAX  / GLYCOLAX ) packet Take 17 g by mouth daily.   SUMAtriptan  (IMITREX ) 100 MG tablet Take 100 mg  by mouth every 2 (two) hours as needed for migraine. May repeat in 2 hours if headache persists or recurs.   Tiotropium Bromide -Olodaterol (STIOLTO RESPIMAT  IN) Inhale 2 puffs into the lungs daily.   simethicone (MYLICON) 80 MG chewable tablet Chew 80 mg by mouth 2 (two) times daily.   pravastatin  (PRAVACHOL ) 40 MG tablet Take 40 mg by mouth every evening.   ARIPiprazole  (ABILIFY ) 5 MG tablet Take 5 mg by mouth daily.   loperamide (IMODIUM) 2 MG capsule Take 2 mg by mouth See admin instructions. Take 2 for the first dose, then 1 after each loose bowel movement. Max of 8 in a day   pantoprazole  (PROTONIX ) 40 MG tablet Take 40 mg by mouth 2 (two) times daily.   propranolol  (INNOPRAN  XL) 120 MG 24 hr capsule Take 120 mg by mouth daily at 12 noon.   cholecalciferol (VITAMIN D) 25 MCG (1000 UNIT) tablet Take 1,000 Units by mouth daily.   benztropine  (COGENTIN ) 2 MG tablet Take 2 mg by mouth 3 (three) times daily.   divalproex  (DEPAKOTE ) 500 MG DR tablet Take 1 tablet (500 mg total) by mouth 3 (three) times daily.   Lacosamide  150 MG TABS Take 1 tablet (150 mg total) by mouth 2 (two) times daily.   gabapentin  (NEURONTIN ) 600 MG tablet Take 1 tablet (600 mg total) by mouth 3 (three) times daily.     ALLERGIES  Allergies  Allergen Reactions   Bee Venom Hives    SOCIAL & SUBSTANCE USE HISTORY  Social History   Socioeconomic History   Marital status: Single    Spouse name: Not on file   Number of children: Not on file   Years of education: Not on file   Highest education level: Not on file  Occupational History   Not on file  Tobacco Use   Smoking status: Every Day    Current packs/day: 1.00    Average packs/day: 1 pack/day for 7.0 years (7.0 ttl pk-yrs)    Types: Cigarettes   Smokeless tobacco: Former    Types: Chew, Snuff  Vaping Use   Vaping status: Every Day  Substance and Sexual Activity   Alcohol use: No   Drug use: Not Currently   Sexual activity: Not on file  Other Topics  Concern   Not on file  Social History Narrative   Not on file   Social Drivers of Health   Financial Resource Strain: Not on file  Food Insecurity: Not on file  Transportation Needs: Not on file  Physical Activity: Not on file  Stress: Not on file  Social Connections: Not on file   Social History   Tobacco Use  Smoking Status Every Day   Current packs/day: 1.00   Average packs/day: 1 pack/day for 7.0 years (7.0 ttl pk-yrs)   Types: Cigarettes  Smokeless Tobacco Former   Types: Chew, Snuff   Social History   Substance and Sexual Activity  Alcohol Use No   Social History   Substance and Sexual Activity  Drug Use Not Currently      FAMILY HISTORY  Family History  Family history unknown: Yes   Family Psychiatric History (if known):  Denies   MENTAL STATUS EXAM (MSE)  Mental Status Exam: General Appearance: Casual  Orientation:  Full (Time, Place, and Person)  Memory:  Immediate;   Good Recent;   Good Remote;   Good  Concentration:  Concentration: Good  Recall:  Fair  Attention  Good  Eye Contact:  Good  Speech:  Clear and Coherent  Language:  Good  Volume:  Normal  Mood: "Good"  Affect:  Congruent  Thought Process:  Coherent  Thought Content:  Delusions  Suicidal Thoughts:  No  Homicidal Thoughts:  No  Judgement:  Fair  Insight:  Fair  Psychomotor Activity:  Normal  Akathisia:  Negative  Fund of Knowledge:  Fair    Assets:  Presenter, broadcasting Social Support  Cognition:  WNL  ADL's:  Intact  AIMS (if indicated):       VITALS  Blood pressure 122/82, pulse 67, temperature 98.6 F (37 C), temperature source Oral, resp. rate 17, height 5\' 11"  (1.803 m), weight 85.3 kg, SpO2 99%.  LABS  Admission on 09/26/2023  Component Date Value Ref Range Status   Sodium 09/26/2023 129 (L)  135 - 145 mmol/L Final   Potassium 09/26/2023 4.0  3.5 - 5.1 mmol/L Final   Chloride 09/26/2023 91 (L)  98 - 111 mmol/L Final   CO2 09/26/2023 26  22 - 32  mmol/L Final   Glucose, Bld 09/26/2023 103 (H)  70 - 99 mg/dL Final   Glucose reference range applies only to samples taken after fasting for at least 8 hours.   BUN 09/26/2023 10  6 - 20 mg/dL Final   Creatinine, Ser 09/26/2023 0.69  0.61 - 1.24 mg/dL Final   Calcium  09/26/2023 9.6  8.9 - 10.3 mg/dL Final   Total Protein 09/81/1914 7.4  6.5 - 8.1 g/dL Final   Albumin 78/29/5621 4.3  3.5 - 5.0 g/dL Final   AST 30/86/5784 22  15 - 41 U/L Final   ALT 09/26/2023 19  0 - 44 U/L Final   Alkaline Phosphatase 09/26/2023 82  38 - 126 U/L Final   Total Bilirubin 09/26/2023 0.8  0.0 - 1.2 mg/dL Final   GFR, Estimated 09/26/2023 >60  >60 mL/min Final   Comment: (NOTE) Calculated using the CKD-EPI Creatinine Equation (2021)    Anion gap 09/26/2023 12  5 - 15 Final   Performed at [facility], [address] [place], [place] [address]   Alcohol, Ethyl (B) 09/26/2023 <15  <15 mg/dL Final   Comment: Please note change in reference range. (NOTE) For medical purposes only. Performed at [facility], [address] [place], [place] [address]    Salicylate Lvl 09/26/2023 <7.0 (L)  7.0 - 30.0 mg/dL Final   Performed at [facility], [address] [place], [place] [address]   Acetaminophen  (Tylenol ), Serum 09/26/2023 <10 (L)  10 - 30 ug/mL Final   Comment: (NOTE) Therapeutic concentrations vary significantly. A range of 10-30 ug/mL  may be an effective concentration for many patients. However, some  are best treated at concentrations outside of this range. Acetaminophen  concentrations >150 ug/mL at 4 hours after ingestion  and >50 ug/mL at 12 hours after ingestion are often associated with  toxic reactions.  Performed at [facility], [address] [place], [place] [address]    WBC 09/26/2023 5.7  4.0 - 10.5 K/uL Final   RBC 09/26/2023 5.00  4.22 - 5.81 MIL/uL Final   Hemoglobin 09/26/2023 15.5  13.0 - 17.0 g/dL Final   HCT 53/66/4403 44.0  39.0 -  52.0 % Final   MCV 09/26/2023 88.0  80.0 - 100.0 fL Final  MCH 09/26/2023 31.0  26.0 - 34.0 pg Final   MCHC 09/26/2023 35.2  30.0 - 36.0 g/dL Final   RDW 69/62/9528 12.4  11.5 - 15.5 % Final   Platelets 09/26/2023 276  150 - 400 K/uL Final   nRBC 09/26/2023 0.0  0.0 - 0.2 % Final   Performed at [facility], [address] [place], [place] [address]   Tricyclic, Ur Screen 09/26/2023 NONE DETECTED  NONE DETECTED Final   Amphetamines, Ur Screen 09/26/2023 NONE DETECTED  NONE DETECTED Final   MDMA (Ecstasy)Ur Screen 09/26/2023 NONE DETECTED  NONE DETECTED Final   Cocaine Metabolite,Ur [place] 09/26/2023 NONE DETECTED  NONE DETECTED Final   Opiate, Ur Screen 09/26/2023 NONE DETECTED  NONE DETECTED Final   Phencyclidine (PCP) Ur S 09/26/2023 NONE DETECTED  NONE DETECTED Final   Cannabinoid 50 Ng, Ur [place] 09/26/2023 NONE DETECTED  NONE DETECTED Final   Barbiturates, Ur Screen 09/26/2023 NONE DETECTED  NONE DETECTED Final   Benzodiazepine, Ur Scrn 09/26/2023 NONE DETECTED  NONE DETECTED Final   Methadone Scn, Ur 09/26/2023 NONE DETECTED  NONE DETECTED Final   Comment: (NOTE) Tricyclics + metabolites, urine    Cutoff 1000 ng/mL Amphetamines + metabolites, urine  Cutoff 1000 ng/mL MDMA (Ecstasy), urine              Cutoff 500 ng/mL Cocaine Metabolite, urine          Cutoff 300 ng/mL Opiate + metabolites, urine        Cutoff 300 ng/mL Phencyclidine (PCP), urine         Cutoff 25 ng/mL Cannabinoid, urine                 Cutoff 50 ng/mL Barbiturates + metabolites, urine  Cutoff 200 ng/mL Benzodiazepine, urine              Cutoff 200 ng/mL Methadone, urine                   Cutoff 300 ng/mL  The urine drug screen provides only a preliminary, unconfirmed analytical test result and should not be used for non-medical purposes. Clinical consideration and professional judgment should be applied to any positive drug screen result due to possible interfering substances. A more specific alternate  chemical method must be used in order to obtain a confirmed analytical result. Gas chromatography / mass spectrometry (GC/MS) is the preferred confirm                          atory method. Performed at [facility], [address] [place], [place] [address]     PSYCHIATRIC REVIEW OF SYSTEMS (ROS)  ROS: Notable for the following relevant positive findings: Review of Systems  Psychiatric/Behavioral:  Positive for hallucinations.     Additional findings:      Musculoskeletal: No abnormal movements observed      Gait & Station: Laying/Sitting      Pain Screening: Denies      Nutrition & Dental Concerns: n/a  RISK FORMULATION/ASSESSMENT  Is the patient experiencing any suicidal or homicidal ideations: No       Explain if yes: Denies current, see HPI Protective factors considered for safety management: denies suicidal ideation, future oriented, identifies his religious beliefs as protective, social support, history of managing stress without engaging in suicidal behavior  Risk factors/concerns considered for safety management:  Male gender  Is there a safety management plan with the patient and treatment team to  minimize risk factors and promote protective factors: Yes           Explain: Medication management; crisis line information; follow-up with outpatient psychiatrist; ED return precautions  Is crisis care placement or psychiatric hospitalization recommended: No     Based on my current evaluation and risk assessment, patient is determined at this time to be at:  Low risk  *RISK ASSESSMENT Risk assessment is a dynamic process; it is possible that this patient's condition, and risk level, may change. This should be re-evaluated and managed over time as appropriate. Please re-consult psychiatric consult services if additional assistance is needed in terms of risk assessment and management. If your team decides to discharge this patient, please advise the patient how to  best access emergency psychiatric services, or to call 911, if their condition worsens or they feel unsafe in any way.   Atha Lazar, MD Telepsychiatry Consult Services

## 2023-09-27 NOTE — ED Notes (Addendum)
 [facility] Contact [phone]   Contacted for ride home. Returned all belongings.  Pt alert, cooperative, appropriate for discharge. Caregiver of patient, voices understanding of discharge instructions and appropriate follow up if needed. Pt in NAD. Safe for discharge.

## 2023-09-27 NOTE — ED Notes (Signed)
 Hospital meal provided.  100% consumed, pt tolerated w/o complaints.  Waste discarded appropriately.

## 2023-09-27 NOTE — ED Notes (Signed)
 RN spoke to owner of [facility] where pt resides, [facility] [phone]   She is aware of plan for DC and medication changes. Will notify when pt ready for discharge ride.

## 2023-09-27 NOTE — ED Provider Notes (Signed)
 Vitals:   09/26/23 1934  BP: (!) 141/79  Pulse: 63  Resp: 18  Temp: 98.4 F (36.9 C)  SpO2: 96%     Patient currently resting in the hallway without difficulty.  Currently awaiting psychiatry consult.  He is currently under IVC    Iver Marker, MD 09/27/23 [phone]

## 2023-09-27 NOTE — ED Notes (Signed)
IVC PAPERS  RESCINDED PER  DR STAFFORD  MD  INFORMED  ALLY  RN ?

## 2023-09-27 NOTE — ED Notes (Signed)
Pt requested shower; provided clean hospital clothing and linens.  Shower setup provided with soap, shampoo, toothbrush/toothpaste, and deodorant.  Pt able to preform own ADL's with no assistance.    

## 2023-09-27 NOTE — ED Notes (Signed)
 Hospital meal provided, pt tolerated w/o complaints.  Waste discarded appropriately.

## 2023-11-12 ENCOUNTER — Ambulatory Visit (INDEPENDENT_AMBULATORY_CARE_PROVIDER_SITE_OTHER): Payer: MEDICAID | Admitting: Podiatry

## 2023-11-12 DIAGNOSIS — D492 Neoplasm of unspecified behavior of bone, soft tissue, and skin: Secondary | ICD-10-CM

## 2023-11-12 NOTE — Progress Notes (Signed)
 Subjective:  Patient ID: Russell Buchanan, male    DOB: September 04, 1975,  MRN: [id number]  Chief Complaint  Patient presents with   Skin neoplasm    Skin neoplasm    48 y.o. male presents with the above complaint.  Patient presents with bilateral fifth metatarsal plantarflexed metatarsal with underlying porokeratotic lesion.  Patient states is painful to touch painful to walk on.  He wears regular shoes he does not wear any current insoles.  He would like to discuss treatment options.  Shaving down helps out.  He has not seen anyone else prior to seeing me for this.  He denies any other acute complaints.   Review of Systems: Negative except as noted in the HPI. Denies N/V/F/Ch.  Past Medical History:  Diagnosis Date   Antisocial personality disorder (HCC)    Bipolar disorder (HCC)    Environmental and seasonal allergies    GERD (gastroesophageal reflux disease)    Headache    Hyperlipidemia    Infected elbow (HCC)    Schizophrenia (HCC)    Seizures (HCC)    Tremors of nervous system     Current Outpatient Medications:    albuterol  (PROVENTIL  HFA;VENTOLIN  HFA) 108 (90 Base) MCG/ACT inhaler, Inhale 2 puffs into the lungs every 6 (six) hours as needed for wheezing or shortness of breath., Disp: 1 Inhaler, Rfl: 0   ARIPiprazole  (ABILIFY ) 10 MG tablet, Take 1 tablet (10 mg total) by mouth daily., Disp: 30 tablet, Rfl: 0   benztropine  (COGENTIN ) 2 MG tablet, Take 2 mg by mouth 3 (three) times daily., Disp: , Rfl:    cetirizine (ZYRTEC) 10 MG tablet, Take 10 mg by mouth daily., Disp: , Rfl:    cholecalciferol (VITAMIN D) 25 MCG (1000 UNIT) tablet, Take 1,000 Units by mouth daily., Disp: , Rfl:    divalproex  (DEPAKOTE ) 500 MG DR tablet, Take 1 tablet (500 mg total) by mouth 3 (three) times daily., Disp: 90 tablet, Rfl: 0   docusate sodium  (COLACE) 100 MG capsule, Take 100 mg by mouth daily., Disp: , Rfl:    EPINEPHrine  0.3 mg/0.3 mL IJ SOAJ injection, Inject 0.3 mg into the muscle as needed  (allergic reactions). , Disp: , Rfl:    escitalopram  (LEXAPRO ) 20 MG tablet, Take 20 mg by mouth daily., Disp: , Rfl:    gabapentin  (NEURONTIN ) 600 MG tablet, Take 1 tablet (600 mg total) by mouth 3 (three) times daily., Disp: 90 tablet, Rfl: 0   gemfibrozil  (LOPID ) 600 MG tablet, Take 600 mg by mouth 2 (two) times daily before a meal., Disp: , Rfl:    ketoconazole (NIZORAL) 2 % cream, Apply 1 application topically 2 (two) times daily as needed for irritation., Disp: , Rfl:    Lacosamide  150 MG TABS, Take 1 tablet (150 mg total) by mouth 2 (two) times daily., Disp: 30 tablet, Rfl: 0   linaclotide  (LINZESS ) 72 MCG capsule, Take 72 mcg by mouth daily. , Disp: , Rfl:    loperamide (IMODIUM) 2 MG capsule, Take 2 mg by mouth See admin instructions. Take 2 for the first dose, then 1 after each loose bowel movement. Max of 8 in a day, Disp: , Rfl:    pantoprazole  (PROTONIX ) 40 MG tablet, Take 40 mg by mouth 2 (two) times daily., Disp: , Rfl:    polyethylene glycol (MIRALAX  / GLYCOLAX ) packet, Take 17 g by mouth daily., Disp: , Rfl:    pravastatin  (PRAVACHOL ) 40 MG tablet, Take 40 mg by mouth every evening., Disp: , Rfl:  propranolol  (INNOPRAN  XL) 120 MG 24 hr capsule, Take 120 mg by mouth daily at 12 noon., Disp: , Rfl:    simethicone (MYLICON) 80 MG chewable tablet, Chew 80 mg by mouth 2 (two) times daily., Disp: , Rfl:    SUMAtriptan  (IMITREX ) 100 MG tablet, Take 100 mg by mouth every 2 (two) hours as needed for migraine. May repeat in 2 hours if headache persists or recurs., Disp: , Rfl:    Tiotropium Bromide -Olodaterol (STIOLTO RESPIMAT  IN), Inhale 2 puffs into the lungs daily., Disp: , Rfl:   Social History   Tobacco Use  Smoking Status Every Day   Current packs/day: 1.00   Average packs/day: 1 pack/day for 7.0 years (7.0 ttl pk-yrs)   Types: Cigarettes  Smokeless Tobacco Former   Types: Chew, Snuff    Allergies  Allergen Reactions   Bee Venom Hives   Objective:  There were no vitals  filed for this visit. There is no height or weight on file to calculate BMI. Constitutional Well developed. Well nourished.  Vascular Dorsalis pedis pulses palpable bilaterally. Posterior tibial pulses palpable bilaterally. Capillary refill normal to all digits.  No cyanosis or clubbing noted. Pedal hair growth normal.  Neurologic Normal speech. Oriented to person, place, and time. Epicritic sensation to light touch grossly present bilaterally.  Dermatologic Hyperkeratotic lesion to bilateral submetatarsal 5 noted.  Central nucleated core noted.  Pain on palpation to the lesion.  No pinpoint bleeding upon debridement.  Orthopedic: Normal joint ROM without pain or crepitus bilaterally. No visible deformities. No bony tenderness.   Radiographs: None Assessment:   No diagnosis found.   Plan:  Patient was evaluated and treated and all questions answered.  Bilateral plantarflexed fifth metatarsal with underlying porokeratotic lesion/skin neoplasm -All questions and concerns were discussed with the patient in extensive detail -Using chisel blade to handle the lesion was debrided down to healthy striated tissue as a courtesy.  The pain was relieved upon debridement -I discussed shoe gear modification in extensive detail.  No follow-ups on file.

## 2023-12-31 ENCOUNTER — Ambulatory Visit (INDEPENDENT_AMBULATORY_CARE_PROVIDER_SITE_OTHER): Payer: MEDICAID | Admitting: Podiatry

## 2023-12-31 DIAGNOSIS — L0889 Other specified local infections of the skin and subcutaneous tissue: Secondary | ICD-10-CM | POA: Diagnosis not present

## 2023-12-31 MED ORDER — TERBINAFINE HCL 250 MG PO TABS: 250.0000 mg | ORAL_TABLET | Freq: Every day | ORAL | 0 refills | Status: AC

## 2023-12-31 MED ORDER — CLOTRIMAZOLE-BETAMETHASONE 1-0.05 % EX CREA: 1.0000 | TOPICAL_CREAM | Freq: Every day | CUTANEOUS | 0 refills | Status: AC

## 2023-12-31 NOTE — Progress Notes (Signed)
 Subjective:  Patient ID: Russell Buchanan, male    DOB: Jan 22, 1976,  MRN: [id number]  Chief Complaint  Patient presents with   Foot Pain    Pt stated that he has a rash on the top of his foot and it itches     48 y.o. male presents with the above complaint.  Patient presents with left dorsal midfoot athlete's foot/skin dermatitis.  Patient states this came out of nowhere started about few weeks ago his itches like crazy.  He wanted to discuss treatment options for i he does not have any pain.  He would like to discuss treatment options for this.  Denies any other acute issues.   Review of Systems: Negative except as noted in the HPI. Denies N/V/F/Ch.  Past Medical History:  Diagnosis Date   Antisocial personality disorder (HCC)    Bipolar disorder (HCC)    Environmental and seasonal allergies    GERD (gastroesophageal reflux disease)    Headache    Hyperlipidemia    Infected elbow (HCC)    Schizophrenia (HCC)    Seizures (HCC)    Tremors of nervous system     Current Outpatient Medications:    clotrimazole -betamethasone  (LOTRISONE ) cream, Apply 1 Application topically daily., Disp: 30 g, Rfl: 0   terbinafine  (LAMISIL ) 250 MG tablet, Take 1 tablet (250 mg total) by mouth daily., Disp: 14 tablet, Rfl: 0   albuterol  (PROVENTIL  HFA;VENTOLIN  HFA) 108 (90 Base) MCG/ACT inhaler, Inhale 2 puffs into the lungs every 6 (six) hours as needed for wheezing or shortness of breath., Disp: 1 Inhaler, Rfl: 0   ARIPiprazole  (ABILIFY ) 10 MG tablet, Take 1 tablet (10 mg total) by mouth daily., Disp: 30 tablet, Rfl: 0   benztropine  (COGENTIN ) 2 MG tablet, Take 2 mg by mouth 3 (three) times daily., Disp: , Rfl:    cetirizine (ZYRTEC) 10 MG tablet, Take 10 mg by mouth daily., Disp: , Rfl:    cholecalciferol (VITAMIN D) 25 MCG (1000 UNIT) tablet, Take 1,000 Units by mouth daily., Disp: , Rfl:    divalproex  (DEPAKOTE ) 500 MG DR tablet, Take 1 tablet (500 mg total) by mouth 3 (three) times daily., Disp: 90  tablet, Rfl: 0   docusate sodium  (COLACE) 100 MG capsule, Take 100 mg by mouth daily., Disp: , Rfl:    EPINEPHrine  0.3 mg/0.3 mL IJ SOAJ injection, Inject 0.3 mg into the muscle as needed (allergic reactions). , Disp: , Rfl:    escitalopram  (LEXAPRO ) 20 MG tablet, Take 20 mg by mouth daily., Disp: , Rfl:    gabapentin  (NEURONTIN ) 600 MG tablet, Take 1 tablet (600 mg total) by mouth 3 (three) times daily., Disp: 90 tablet, Rfl: 0   gemfibrozil  (LOPID ) 600 MG tablet, Take 600 mg by mouth 2 (two) times daily before a meal., Disp: , Rfl:    ketoconazole (NIZORAL) 2 % cream, Apply 1 application topically 2 (two) times daily as needed for irritation., Disp: , Rfl:    Lacosamide  150 MG TABS, Take 1 tablet (150 mg total) by mouth 2 (two) times daily., Disp: 30 tablet, Rfl: 0   linaclotide  (LINZESS ) 72 MCG capsule, Take 72 mcg by mouth daily. , Disp: , Rfl:    loperamide (IMODIUM) 2 MG capsule, Take 2 mg by mouth See admin instructions. Take 2 for the first dose, then 1 after each loose bowel movement. Max of 8 in a day, Disp: , Rfl:    pantoprazole  (PROTONIX ) 40 MG tablet, Take 40 mg by mouth 2 (two) times daily., Disp: ,  Rfl:    polyethylene glycol (MIRALAX  / GLYCOLAX ) packet, Take 17 g by mouth daily., Disp: , Rfl:    pravastatin  (PRAVACHOL ) 40 MG tablet, Take 40 mg by mouth every evening., Disp: , Rfl:    propranolol  (INNOPRAN  XL) 120 MG 24 hr capsule, Take 120 mg by mouth daily at 12 noon., Disp: , Rfl:    simethicone (MYLICON) 80 MG chewable tablet, Chew 80 mg by mouth 2 (two) times daily., Disp: , Rfl:    SUMAtriptan  (IMITREX ) 100 MG tablet, Take 100 mg by mouth every 2 (two) hours as needed for migraine. May repeat in 2 hours if headache persists or recurs., Disp: , Rfl:    Tiotropium Bromide -Olodaterol (STIOLTO RESPIMAT  IN), Inhale 2 puffs into the lungs daily., Disp: , Rfl:   Social History   Tobacco Use  Smoking Status Every Day   Current packs/day: 1.00   Average packs/day: 1 pack/day for  7.0 years (7.0 ttl pk-yrs)   Types: Cigarettes  Smokeless Tobacco Former   Types: Chew, Snuff    Allergies  Allergen Reactions   Bee Venom Hives   Objective:  There were no vitals filed for this visit. There is no height or weight on file to calculate BMI. Constitutional Well developed. Well nourished.  Vascular Dorsalis pedis pulses palpable bilaterally. Posterior tibial pulses palpable bilaterally. Capillary refill normal to all digits.  No cyanosis or clubbing noted. Pedal hair growth normal.  Neurologic Normal speech. Oriented to person, place, and time. Epicritic sensation to light touch grossly present bilaterally.  Dermatologic Left dorsal midfoot eczema versus dermatitis versus athlete's foot noted subjective component of itching no open wounds or lesion noted  Orthopedic: Normal joint ROM without pain or crepitus bilaterally. No visible deformities. No bony tenderness.   Radiographs: None Assessment:   1. Other specified local infections of the skin and subcutaneous tissue    Plan:  Patient was evaluated and treated and all questions answered.  Left foot eczema versus athlete's foot - All questions and concerns were discussed with the patient in extensive detail given the amount of athlete's foot that is present patient would benefit from Lotrisone  cream to be applied twice a day as well as Lamisil  for 14 days.  He denies any liver issues.  He would like to proceed with his treatment options - If there is no improvement we will come back and see  No follow-ups on file.

## 2024-02-04 ENCOUNTER — Ambulatory Visit (INDEPENDENT_AMBULATORY_CARE_PROVIDER_SITE_OTHER): Payer: MEDICAID | Admitting: Podiatry

## 2024-02-04 DIAGNOSIS — D492 Neoplasm of unspecified behavior of bone, soft tissue, and skin: Secondary | ICD-10-CM | POA: Diagnosis not present

## 2024-02-04 NOTE — Progress Notes (Signed)
 Subjective:  Patient ID: Russell Buchanan, male    DOB: 07/21/1975,  MRN: [id number]  Chief Complaint  Patient presents with   Skin neoplasm      Pt stated that the places on the bottom of his feet are causing him some discomfort     48 y.o. male presents with the above complaint.  Patient presents with bilateral fifth metatarsal plantarflexed metatarsal with underlying porokeratotic lesion.  Patient states is painful to touch painful to walk on.  He wears regular shoes he does not wear any current insoles.  He would like to discuss treatment options.  Shaving down helps out.  He has not seen anyone else prior to seeing me for this.  He denies any other acute complaints.   Review of Systems: Negative except as noted in the HPI. Denies N/V/F/Ch.  Past Medical History:  Diagnosis Date   Antisocial personality disorder (HCC)    Bipolar disorder (HCC)    Environmental and seasonal allergies    GERD (gastroesophageal reflux disease)    Headache    Hyperlipidemia    Infected elbow (HCC)    Schizophrenia (HCC)    Seizures (HCC)    Tremors of nervous system     Current Outpatient Medications:    albuterol  (PROVENTIL  HFA;VENTOLIN  HFA) 108 (90 Base) MCG/ACT inhaler, Inhale 2 puffs into the lungs every 6 (six) hours as needed for wheezing or shortness of breath., Disp: 1 Inhaler, Rfl: 0   ARIPiprazole  (ABILIFY ) 10 MG tablet, Take 1 tablet (10 mg total) by mouth daily., Disp: 30 tablet, Rfl: 0   benztropine  (COGENTIN ) 2 MG tablet, Take 2 mg by mouth 3 (three) times daily., Disp: , Rfl:    cetirizine (ZYRTEC) 10 MG tablet, Take 10 mg by mouth daily., Disp: , Rfl:    cholecalciferol (VITAMIN D) 25 MCG (1000 UNIT) tablet, Take 1,000 Units by mouth daily., Disp: , Rfl:    clotrimazole -betamethasone  (LOTRISONE ) cream, Apply 1 Application topically daily., Disp: 30 g, Rfl: 0   divalproex  (DEPAKOTE ) 500 MG DR tablet, Take 1 tablet (500 mg total) by mouth 3 (three) times daily., Disp: 90 tablet, Rfl:  0   docusate sodium  (COLACE) 100 MG capsule, Take 100 mg by mouth daily., Disp: , Rfl:    EPINEPHrine  0.3 mg/0.3 mL IJ SOAJ injection, Inject 0.3 mg into the muscle as needed (allergic reactions). , Disp: , Rfl:    escitalopram  (LEXAPRO ) 20 MG tablet, Take 20 mg by mouth daily., Disp: , Rfl:    gabapentin  (NEURONTIN ) 600 MG tablet, Take 1 tablet (600 mg total) by mouth 3 (three) times daily., Disp: 90 tablet, Rfl: 0   gemfibrozil  (LOPID ) 600 MG tablet, Take 600 mg by mouth 2 (two) times daily before a meal., Disp: , Rfl:    ketoconazole (NIZORAL) 2 % cream, Apply 1 application topically 2 (two) times daily as needed for irritation., Disp: , Rfl:    Lacosamide  150 MG TABS, Take 1 tablet (150 mg total) by mouth 2 (two) times daily., Disp: 30 tablet, Rfl: 0   linaclotide  (LINZESS ) 72 MCG capsule, Take 72 mcg by mouth daily. , Disp: , Rfl:    loperamide (IMODIUM) 2 MG capsule, Take 2 mg by mouth See admin instructions. Take 2 for the first dose, then 1 after each loose bowel movement. Max of 8 in a day, Disp: , Rfl:    pantoprazole  (PROTONIX ) 40 MG tablet, Take 40 mg by mouth 2 (two) times daily., Disp: , Rfl:    polyethylene glycol (MIRALAX  /  GLYCOLAX ) packet, Take 17 g by mouth daily., Disp: , Rfl:    pravastatin  (PRAVACHOL ) 40 MG tablet, Take 40 mg by mouth every evening., Disp: , Rfl:    propranolol  (INNOPRAN  XL) 120 MG 24 hr capsule, Take 120 mg by mouth daily at 12 noon., Disp: , Rfl:    simethicone (MYLICON) 80 MG chewable tablet, Chew 80 mg by mouth 2 (two) times daily., Disp: , Rfl:    SUMAtriptan  (IMITREX ) 100 MG tablet, Take 100 mg by mouth every 2 (two) hours as needed for migraine. May repeat in 2 hours if headache persists or recurs., Disp: , Rfl:    terbinafine  (LAMISIL ) 250 MG tablet, Take 1 tablet (250 mg total) by mouth daily., Disp: 14 tablet, Rfl: 0   Tiotropium Bromide -Olodaterol (STIOLTO RESPIMAT  IN), Inhale 2 puffs into the lungs daily., Disp: , Rfl:   Social History   Tobacco  Use  Smoking Status Every Day   Current packs/day: 1.00   Average packs/day: 1 pack/day for 7.0 years (7.0 ttl pk-yrs)   Types: Cigarettes  Smokeless Tobacco Former   Types: Chew, Snuff    Allergies  Allergen Reactions   Bee Venom Hives   Objective:  There were no vitals filed for this visit. There is no height or weight on file to calculate BMI. Constitutional Well developed. Well nourished.  Vascular Dorsalis pedis pulses palpable bilaterally. Posterior tibial pulses palpable bilaterally. Capillary refill normal to all digits.  No cyanosis or clubbing noted. Pedal hair growth normal.  Neurologic Normal speech. Oriented to person, place, and time. Epicritic sensation to light touch grossly present bilaterally.  Dermatologic Hyperkeratotic lesion to bilateral submetatarsal 5 noted.  Central nucleated core noted.  Pain on palpation to the lesion.  No pinpoint bleeding upon debridement.  Orthopedic: Normal joint ROM without pain or crepitus bilaterally. No visible deformities. No bony tenderness.   Radiographs: None Assessment:   1. Skin neoplasm      Plan:  Patient was evaluated and treated and all questions answered.  Bilateral plantarflexed fifth metatarsal with underlying porokeratotic lesion/skin neoplasm -All questions and concerns were discussed with the patient in extensive detail -Using chisel blade to handle the lesion was debrided down to healthy striated tissue as a courtesy.  The pain was relieved upon debridement -I discussed shoe gear modification in extensive detail.  No follow-ups on file.

## 2024-04-07 ENCOUNTER — Ambulatory Visit (INDEPENDENT_AMBULATORY_CARE_PROVIDER_SITE_OTHER): Payer: MEDICAID | Admitting: Podiatry

## 2024-04-07 DIAGNOSIS — M216X1 Other acquired deformities of right foot: Secondary | ICD-10-CM

## 2024-04-07 DIAGNOSIS — Z01818 Encounter for other preprocedural examination: Secondary | ICD-10-CM

## 2024-04-07 DIAGNOSIS — M216X2 Other acquired deformities of left foot: Secondary | ICD-10-CM

## 2024-04-07 NOTE — Progress Notes (Signed)
 Subjective:  Patient ID: Russell Buchanan, male    DOB: Jul 24, 1975,  MRN: [id number]  Chief Complaint  Patient presents with   Nail Problem    callouses, nails clipped. He would like to talk about possible surgery to relieve his pain.    48 y.o. male presents with the above complaint.  Patient presents with bilateral fifth metatarsal plantarflexed metatarsal with underlying porokeratotic lesion.  Patient states is painful to touch painful to walk on.  He wears regular shoes he does not wear any current insoles.  He would like to discuss treatment options.  Shaving down helps out.  He has not seen anyone else prior to seeing me for this.  He denies any other acute complaints.   Review of Systems: Negative except as noted in the HPI. Denies N/V/F/Ch.  Past Medical History:  Diagnosis Date   Antisocial personality disorder (HCC)    Bipolar disorder (HCC)    Environmental and seasonal allergies    GERD (gastroesophageal reflux disease)    Headache    Hyperlipidemia    Infected elbow (HCC)    Schizophrenia (HCC)    Seizures (HCC)    Tremors of nervous system     Current Outpatient Medications:    albuterol  (PROVENTIL  HFA;VENTOLIN  HFA) 108 (90 Base) MCG/ACT inhaler, Inhale 2 puffs into the lungs every 6 (six) hours as needed for wheezing or shortness of breath., Disp: 1 Inhaler, Rfl: 0   ARIPiprazole  (ABILIFY ) 10 MG tablet, Take 1 tablet (10 mg total) by mouth daily., Disp: 30 tablet, Rfl: 0   benztropine  (COGENTIN ) 2 MG tablet, Take 2 mg by mouth 3 (three) times daily., Disp: , Rfl:    cetirizine (ZYRTEC) 10 MG tablet, Take 10 mg by mouth daily., Disp: , Rfl:    cholecalciferol (VITAMIN D) 25 MCG (1000 UNIT) tablet, Take 1,000 Units by mouth daily., Disp: , Rfl:    clotrimazole -betamethasone  (LOTRISONE ) cream, Apply 1 Application topically daily., Disp: 30 g, Rfl: 0   divalproex  (DEPAKOTE ) 500 MG DR tablet, Take 1 tablet (500 mg total) by mouth 3 (three) times daily., Disp: 90 tablet,  Rfl: 0   docusate sodium  (COLACE) 100 MG capsule, Take 100 mg by mouth daily., Disp: , Rfl:    EPINEPHrine  0.3 mg/0.3 mL IJ SOAJ injection, Inject 0.3 mg into the muscle as needed (allergic reactions). , Disp: , Rfl:    escitalopram  (LEXAPRO ) 20 MG tablet, Take 20 mg by mouth daily., Disp: , Rfl:    gabapentin  (NEURONTIN ) 600 MG tablet, Take 1 tablet (600 mg total) by mouth 3 (three) times daily., Disp: 90 tablet, Rfl: 0   gemfibrozil  (LOPID ) 600 MG tablet, Take 600 mg by mouth 2 (two) times daily before a meal., Disp: , Rfl:    ketoconazole (NIZORAL) 2 % cream, Apply 1 application topically 2 (two) times daily as needed for irritation., Disp: , Rfl:    Lacosamide  150 MG TABS, Take 1 tablet (150 mg total) by mouth 2 (two) times daily., Disp: 30 tablet, Rfl: 0   linaclotide  (LINZESS ) 72 MCG capsule, Take 72 mcg by mouth daily. , Disp: , Rfl:    loperamide (IMODIUM) 2 MG capsule, Take 2 mg by mouth See admin instructions. Take 2 for the first dose, then 1 after each loose bowel movement. Max of 8 in a day, Disp: , Rfl:    pantoprazole  (PROTONIX ) 40 MG tablet, Take 40 mg by mouth 2 (two) times daily., Disp: , Rfl:    polyethylene glycol (MIRALAX  / GLYCOLAX ) packet, Take  17 g by mouth daily., Disp: , Rfl:    pravastatin  (PRAVACHOL ) 40 MG tablet, Take 40 mg by mouth every evening., Disp: , Rfl:    propranolol  (INNOPRAN  XL) 120 MG 24 hr capsule, Take 120 mg by mouth daily at 12 noon., Disp: , Rfl:    simethicone (MYLICON) 80 MG chewable tablet, Chew 80 mg by mouth 2 (two) times daily., Disp: , Rfl:    SUMAtriptan  (IMITREX ) 100 MG tablet, Take 100 mg by mouth every 2 (two) hours as needed for migraine. May repeat in 2 hours if headache persists or recurs., Disp: , Rfl:    terbinafine  (LAMISIL ) 250 MG tablet, Take 1 tablet (250 mg total) by mouth daily., Disp: 14 tablet, Rfl: 0   Tiotropium Bromide -Olodaterol (STIOLTO RESPIMAT  IN), Inhale 2 puffs into the lungs daily., Disp: , Rfl:   Social History    Tobacco Use  Smoking Status Every Day   Current packs/day: 1.00   Average packs/day: 1 pack/day for 7.0 years (7.0 ttl pk-yrs)   Types: Cigarettes  Smokeless Tobacco Former   Types: Chew, Snuff    Allergies  Allergen Reactions   Bee Venom Hives   Objective:  There were no vitals filed for this visit. There is no height or weight on file to calculate BMI. Constitutional Well developed. Well nourished.  Vascular Dorsalis pedis pulses palpable bilaterally. Posterior tibial pulses palpable bilaterally. Capillary refill normal to all digits.  No cyanosis or clubbing noted. Pedal hair growth normal.  Neurologic Normal speech. Oriented to person, place, and time. Epicritic sensation to light touch grossly present bilaterally.  Dermatologic Hyperkeratotic lesion to bilateral submetatarsal 5 noted.  Central nucleated core noted.  Pain on palpation to the lesion.  No pinpoint bleeding upon debridement.  Orthopedic: Normal joint ROM without pain or crepitus bilaterally. No visible deformities. No bony tenderness.   Radiographs: None Assessment:   1. Encounter for preoperative examination for general surgical procedure   2. Plantar flexed metatarsal bone of left foot   3. Plantar flexed metatarsal bone of right foot       Plan:  Patient was evaluated and treated and all questions answered.  Bilateral plantarflexed fifth metatarsal with underlying porokeratotic lesion/skin neoplasm -All questions and concerns were discussed with the patient in given that patient has failed multiple conservative treatment options at this time patient will benefit from bilateral floating osteotomy of the fifth metatarsal I discussed my preoperative entire postop plan with the patient in extensive detail he states understanding like to proceed with surgery. Informed surgical risk consent was reviewed and read aloud to the patient.  I reviewed the films.  I have discussed my findings with the patient  in great detail.  I have discussed all risks including but not limited to infection, stiffness, scarring, limp, disability, deformity, damage to blood vessels and nerves, numbness, poor healing, need for braces, arthritis, chronic pain, amputation, death.  All benefits and realistic expectations discussed in great detail.  I have made no promises as to the outcome.  I have provided realistic expectations.  I have offered the patient a 2nd opinion, which they have declined and assured me they preferred to proceed despite the risks   No follow-ups on file.

## 2024-04-17 ENCOUNTER — Telehealth: Payer: Self-pay | Admitting: Podiatry

## 2024-04-17 NOTE — Telephone Encounter (Signed)
 Called and patient is scheduled for 04/27/2024 per their request. Patient not on any GLP1 medications or blood thinners. Patient pharmacy correct in chart.

## 2024-04-21 ENCOUNTER — Telehealth: Payer: Self-pay | Admitting: Podiatry

## 2024-04-21 NOTE — Telephone Encounter (Signed)
 DOS- 04/27/2024  5TH METATARSAL OSTEOTOMY BIL- 28308  [facility] EFFECTIVE DATE- 04/05/2023  PER [facility] WEBS[facility], NO PRIOR AUTH IS REQUIRED FOR CPT CODE 28308.

## 2024-04-27 ENCOUNTER — Other Ambulatory Visit: Payer: Self-pay | Admitting: Podiatry

## 2024-04-27 DIAGNOSIS — M216X1 Other acquired deformities of right foot: Secondary | ICD-10-CM | POA: Diagnosis not present

## 2024-04-27 DIAGNOSIS — M216X2 Other acquired deformities of left foot: Secondary | ICD-10-CM | POA: Diagnosis not present

## 2024-04-27 MED ORDER — IBUPROFEN 800 MG PO TABS: 800.0000 mg | ORAL_TABLET | Freq: Four times a day (QID) | ORAL | 1 refills | Status: DC | PRN

## 2024-04-27 MED ORDER — OXYCODONE-ACETAMINOPHEN 5-325 MG PO TABS
1.0000 | ORAL_TABLET | ORAL | 0 refills | Status: AC | PRN
Start: 2024-04-27 — End: ?

## 2024-05-05 ENCOUNTER — Ambulatory Visit (INDEPENDENT_AMBULATORY_CARE_PROVIDER_SITE_OTHER): Payer: MEDICAID

## 2024-05-05 ENCOUNTER — Encounter: Payer: Self-pay | Admitting: Podiatry

## 2024-05-05 ENCOUNTER — Ambulatory Visit (INDEPENDENT_AMBULATORY_CARE_PROVIDER_SITE_OTHER): Payer: MEDICAID | Admitting: Podiatry

## 2024-05-05 VITALS — Ht 71.0 in | Wt 188.0 lb

## 2024-05-05 DIAGNOSIS — M216X2 Other acquired deformities of left foot: Secondary | ICD-10-CM | POA: Diagnosis not present

## 2024-05-05 DIAGNOSIS — M216X1 Other acquired deformities of right foot: Secondary | ICD-10-CM

## 2024-05-05 NOTE — Progress Notes (Signed)
   Chief Complaint  Patient presents with   Routine Post Op    POV #1 DOS- 04/27/2024, 5TH METATARSAL OSTEOTOMY BIL, he states everything is going well, has no pain or complaints.      Subjective:  POV #1.  Doing well.  No new complaints  Past Medical History:  Diagnosis Date   Antisocial personality disorder (HCC)    Bipolar disorder (HCC)    Environmental and seasonal allergies    GERD (gastroesophageal reflux disease)    Headache    Hyperlipidemia    Infected elbow (HCC)    Schizophrenia (HCC)    Seizures (HCC)    Tremors of nervous system     Past Surgical History:  Procedure Laterality Date   CLOSED REDUCTION WRIST FRACTURE Left    HARDWARE REMOVAL Right 04/14/2018   Procedure: HARDWARE REMOVAL-RIGHT ELBOW;  Surgeon: Cleotilde Barrio, MD;  Location: [facility];  Service: Orthopedics;  Laterality: Right;   ORIF ELBOW FRACTURE Right    SCALP LACERATION REPAIR     WRIST RECONSTRUCTION Left     Allergies  Allergen Reactions   Bee Venom Hives    Objective/Physical Exam Neurovascular status intact.  Incision well coapted with sutures intact. No sign of infectious process noted. No dehiscence. No active bleeding noted.  Mild edema noted to the surgical extremity bilateral.  Radiographic Exam B/L feet 05/05/2024:  Clean osteotomy of the distal neck of the fifth metatarsal bilateral with dorsal translocation of the capital fragment  Assessment: 1. s/p floating fifth metatarsal osteotomy bilateral. DOS: 04/27/2024.  Dr. Franky Blanch   Plan of Care:  -Patient was evaluated. X-rays reviewed - Dressings changed -Continue WBAT postop shoes -Return to clinic next scheduled appointment with Dr. Blanch for suture removal   Thresa EMERSON Sar, DPM [facility]  Dr. Thresa EMERSON Sar, DPM    [address] [place], [place] [address]                Office [phone]  Fax [phone]

## 2024-05-07 ENCOUNTER — Other Ambulatory Visit: Payer: Self-pay | Admitting: Podiatry

## 2024-05-19 ENCOUNTER — Ambulatory Visit (INDEPENDENT_AMBULATORY_CARE_PROVIDER_SITE_OTHER): Payer: MEDICAID | Admitting: Podiatry

## 2024-05-19 DIAGNOSIS — M216X1 Other acquired deformities of right foot: Secondary | ICD-10-CM

## 2024-05-19 DIAGNOSIS — Z9889 Other specified postprocedural states: Secondary | ICD-10-CM

## 2024-05-19 DIAGNOSIS — M216X2 Other acquired deformities of left foot: Secondary | ICD-10-CM

## 2024-05-19 NOTE — Progress Notes (Signed)
° °  Chief Complaint  Patient presents with   Routine Post Op    POV #2 DOS 04/27/2024 B/L 5TH METATARSAL OSTEOTOMY    Subjective:  POV #1.  Doing well.  No new complaints.  No pain surgery helped considerably.  Past Medical History:  Diagnosis Date   Antisocial personality disorder (HCC)    Bipolar disorder (HCC)    Environmental and seasonal allergies    GERD (gastroesophageal reflux disease)    Headache    Hyperlipidemia    Infected elbow (HCC)    Schizophrenia (HCC)    Seizures (HCC)    Tremors of nervous system     Past Surgical History:  Procedure Laterality Date   CLOSED REDUCTION WRIST FRACTURE Left    HARDWARE REMOVAL Right 04/14/2018   Procedure: HARDWARE REMOVAL-RIGHT ELBOW;  Surgeon: Cleotilde Barrio, MD;  Location: [facility];  Service: Orthopedics;  Laterality: Right;   ORIF ELBOW FRACTURE Right    SCALP LACERATION REPAIR     WRIST RECONSTRUCTION Left     Allergies  Allergen Reactions   Bee Venom Hives    Objective/Physical Exam Neurovascular status intact.  Incision well coapted with sutures intact. No sign of infectious process noted. No dehiscence. No active bleeding noted.  Mild edema noted to the surgical extremity bilateral.  Radiographic Exam B/L feet 05/05/2024:  Clean osteotomy of the distal neck of the fifth metatarsal bilateral with dorsal translocation of the capital fragment  Assessment: 1. s/p floating fifth metatarsal osteotomy bilateral. DOS: 04/27/2024.     Plan of Care:  - Clinically healed and officially discharged from my care.  I instructed him to return to regular shoes and can start washing and cleaning his skin.  He states understanding.  He he will return to regular shoes at this time clinically his pain is completely resolved.

## 2024-06-16 ENCOUNTER — Other Ambulatory Visit: Payer: Self-pay | Admitting: Podiatry
